# Patient Record
Sex: Female | Born: 1960
Health system: Southern US, Community
[De-identification: ages and names within clinical notes are randomized; demographics above are authoritative.]

## PROBLEM LIST (undated history)

## (undated) DIAGNOSIS — R519 Headache, unspecified: Secondary | ICD-10-CM

## (undated) DIAGNOSIS — M1990 Unspecified osteoarthritis, unspecified site: Secondary | ICD-10-CM

## (undated) DIAGNOSIS — J189 Pneumonia, unspecified organism: Secondary | ICD-10-CM

## (undated) DIAGNOSIS — R51 Headache: Secondary | ICD-10-CM

## (undated) DIAGNOSIS — F419 Anxiety disorder, unspecified: Secondary | ICD-10-CM

## (undated) DIAGNOSIS — F329 Major depressive disorder, single episode, unspecified: Secondary | ICD-10-CM

## (undated) DIAGNOSIS — G43909 Migraine, unspecified, not intractable, without status migrainosus: Secondary | ICD-10-CM

## (undated) DIAGNOSIS — F32A Depression, unspecified: Secondary | ICD-10-CM

## (undated) DIAGNOSIS — G629 Polyneuropathy, unspecified: Secondary | ICD-10-CM

## (undated) DIAGNOSIS — I10 Essential (primary) hypertension: Secondary | ICD-10-CM

## (undated) HISTORY — PX: CARPAL TUNNEL RELEASE: SHX101

## (undated) HISTORY — PX: OTHER SURGICAL HISTORY: SHX169

## (undated) HISTORY — PX: NECK SURGERY: SHX720

## (undated) HISTORY — PX: KNEE ARTHROSCOPY: SUR90

## (undated) HISTORY — PX: TONSILLECTOMY: SUR1361

## (undated) HISTORY — PX: SHOULDER ARTHROSCOPY: SHX128

## (undated) HISTORY — PX: KNEE SURGERY: SHX244

## (undated) HISTORY — PX: ABDOMINAL HYSTERECTOMY: SHX81

---

## 1999-06-21 HISTORY — PX: BREAST EXCISIONAL BIOPSY: SUR124

## 2010-01-27 ENCOUNTER — Emergency Department (HOSPITAL_COMMUNITY): Admission: EM | Admit: 2010-01-27 | Discharge: 2010-01-27 | Payer: Self-pay | Admitting: Family Medicine

## 2010-04-24 ENCOUNTER — Inpatient Hospital Stay (HOSPITAL_COMMUNITY): Admission: EM | Admit: 2010-04-24 | Discharge: 2010-04-25 | Payer: Self-pay | Admitting: Emergency Medicine

## 2010-08-31 LAB — DIFFERENTIAL
Basophils Relative: 0 % (ref 0–1)
Lymphs Abs: 4.3 10*3/uL — ABNORMAL HIGH (ref 0.7–4.0)
Monocytes Absolute: 1.5 10*3/uL — ABNORMAL HIGH (ref 0.1–1.0)
Neutro Abs: 13.7 10*3/uL — ABNORMAL HIGH (ref 1.7–7.7)
Neutrophils Relative %: 70 % (ref 43–77)

## 2010-08-31 LAB — CULTURE, BLOOD (ROUTINE X 2): Culture  Setup Time: 201111051824

## 2010-08-31 LAB — URINALYSIS, ROUTINE W REFLEX MICROSCOPIC
Glucose, UA: NEGATIVE mg/dL
Ketones, ur: 15 mg/dL — AB
Leukocytes, UA: NEGATIVE
pH: 5 (ref 5.0–8.0)

## 2010-08-31 LAB — CBC
HCT: 42.6 % (ref 36.0–46.0)
Hemoglobin: 12.7 g/dL (ref 12.0–15.0)
Platelets: 113 10*3/uL — ABNORMAL LOW (ref 150–400)
Platelets: 197 10*3/uL (ref 150–400)
RBC: 3.86 MIL/uL — ABNORMAL LOW (ref 3.87–5.11)
RDW: 14.8 % (ref 11.5–15.5)
WBC: 19.6 10*3/uL — ABNORMAL HIGH (ref 4.0–10.5)

## 2010-08-31 LAB — GLUCOSE, CAPILLARY
Glucose-Capillary: 128 mg/dL — ABNORMAL HIGH (ref 70–99)
Glucose-Capillary: 147 mg/dL — ABNORMAL HIGH (ref 70–99)
Glucose-Capillary: 195 mg/dL — ABNORMAL HIGH (ref 70–99)

## 2010-08-31 LAB — BASIC METABOLIC PANEL
BUN: 26 mg/dL — ABNORMAL HIGH (ref 6–23)
Calcium: 8.9 mg/dL (ref 8.4–10.5)
Creatinine, Ser: 0.81 mg/dL (ref 0.4–1.2)
GFR calc Af Amer: >60 mL/min (ref >60)
GFR calc non Af Amer: 23 mL/min — ABNORMAL LOW (ref >60)
GFR calc non Af Amer: >60 mL/min (ref >60)
Potassium: 4.7 mEq/L (ref 3.5–5.1)
Sodium: 137 mEq/L (ref 135–145)

## 2010-08-31 LAB — URINE MICROSCOPIC-ADD ON

## 2010-08-31 LAB — HEMOGLOBIN A1C
Hgb A1c MFr Bld: 6 % — ABNORMAL HIGH (ref <5.7)
Mean Plasma Glucose: 126 mg/dL — ABNORMAL HIGH (ref <117)

## 2010-09-01 ENCOUNTER — Emergency Department (HOSPITAL_COMMUNITY)
Admission: EM | Admit: 2010-09-01 | Discharge: 2010-09-02 | Disposition: A | Discharge status: Home / Self Care | Payer: Self-pay | Attending: Emergency Medicine | Admitting: Emergency Medicine

## 2010-09-01 DIAGNOSIS — F411 Generalized anxiety disorder: Secondary | ICD-10-CM | POA: Insufficient documentation

## 2010-09-01 DIAGNOSIS — Z79899 Other long term (current) drug therapy: Secondary | ICD-10-CM | POA: Insufficient documentation

## 2010-09-01 DIAGNOSIS — E669 Obesity, unspecified: Secondary | ICD-10-CM | POA: Insufficient documentation

## 2010-09-01 DIAGNOSIS — F3289 Other specified depressive episodes: Secondary | ICD-10-CM | POA: Insufficient documentation

## 2010-09-01 DIAGNOSIS — E119 Type 2 diabetes mellitus without complications: Secondary | ICD-10-CM | POA: Insufficient documentation

## 2010-09-01 DIAGNOSIS — M545 Low back pain, unspecified: Secondary | ICD-10-CM | POA: Insufficient documentation

## 2010-09-01 DIAGNOSIS — I10 Essential (primary) hypertension: Secondary | ICD-10-CM | POA: Insufficient documentation

## 2010-09-01 DIAGNOSIS — F329 Major depressive disorder, single episode, unspecified: Secondary | ICD-10-CM | POA: Insufficient documentation

## 2011-04-19 ENCOUNTER — Observation Stay (HOSPITAL_COMMUNITY)
Admission: EM | Admit: 2011-04-19 | Discharge: 2011-04-21 | Disposition: A | Discharge status: Home / Self Care | Payer: Self-pay | Attending: Internal Medicine | Admitting: Internal Medicine

## 2011-04-19 ENCOUNTER — Emergency Department (HOSPITAL_COMMUNITY): Payer: Self-pay

## 2011-04-19 DIAGNOSIS — F329 Major depressive disorder, single episode, unspecified: Secondary | ICD-10-CM | POA: Insufficient documentation

## 2011-04-19 DIAGNOSIS — R079 Chest pain, unspecified: Principal | ICD-10-CM | POA: Insufficient documentation

## 2011-04-19 DIAGNOSIS — R0609 Other forms of dyspnea: Secondary | ICD-10-CM | POA: Insufficient documentation

## 2011-04-19 DIAGNOSIS — I10 Essential (primary) hypertension: Secondary | ICD-10-CM | POA: Insufficient documentation

## 2011-04-19 DIAGNOSIS — D696 Thrombocytopenia, unspecified: Secondary | ICD-10-CM | POA: Insufficient documentation

## 2011-04-19 DIAGNOSIS — E119 Type 2 diabetes mellitus without complications: Secondary | ICD-10-CM | POA: Insufficient documentation

## 2011-04-19 DIAGNOSIS — M7989 Other specified soft tissue disorders: Secondary | ICD-10-CM | POA: Insufficient documentation

## 2011-04-19 DIAGNOSIS — R0989 Other specified symptoms and signs involving the circulatory and respiratory systems: Secondary | ICD-10-CM | POA: Insufficient documentation

## 2011-04-19 DIAGNOSIS — F3289 Other specified depressive episodes: Secondary | ICD-10-CM | POA: Insufficient documentation

## 2011-04-19 LAB — CBC
MCV: 96.7 fL (ref 78.0–100.0)
Platelets: 19 10*3/uL — CL (ref 150–400)
RBC: 3.66 MIL/uL — ABNORMAL LOW (ref 3.87–5.11)
RDW: 15.2 % (ref 11.5–15.5)
WBC: 7.8 10*3/uL (ref 4.0–10.5)

## 2011-04-19 LAB — COMPREHENSIVE METABOLIC PANEL
Albumin: 3.6 g/dL (ref 3.5–5.2)
BUN: 21 mg/dL (ref 6–23)
Chloride: 101 mEq/L (ref 96–112)
Creatinine, Ser: 0.94 mg/dL (ref 0.50–1.10)
GFR calc Af Amer: 80 mL/min — ABNORMAL LOW (ref >90)
Glucose, Bld: 100 mg/dL — ABNORMAL HIGH (ref 70–99)
Total Bilirubin: 0.4 mg/dL (ref 0.3–1.2)
Total Protein: 6.1 g/dL (ref 6.0–8.3)

## 2011-04-19 LAB — POCT I-STAT TROPONIN I: Troponin i, poc: 0 ng/mL (ref 0.00–0.08)

## 2011-04-19 LAB — PROTIME-INR
INR: 1.02 (ref 0.00–1.49)
Prothrombin Time: 13.6 seconds (ref 11.6–15.2)

## 2011-04-19 LAB — APTT: aPTT: 32 seconds (ref 24–37)

## 2011-04-20 DIAGNOSIS — R072 Precordial pain: Secondary | ICD-10-CM

## 2011-04-20 LAB — CK TOTAL AND CKMB (NOT AT ARMC)
CK, MB: 2.6 ng/mL (ref 0.3–4.0)
Relative Index: INVALID (ref 0.0–2.5)

## 2011-04-20 LAB — CARDIAC PANEL(CRET KIN+CKTOT+MB+TROPI)
Relative Index: INVALID (ref 0.0–2.5)
Relative Index: INVALID (ref 0.0–2.5)
Total CK: 40 U/L (ref 7–177)
Troponin I: <0.3 ng/mL (ref <0.30)
Troponin I: <0.3 ng/mL (ref <0.30)

## 2011-04-20 LAB — TROPONIN I: Troponin I: <0.3 ng/mL (ref <0.30)

## 2011-04-20 LAB — CBC
MCHC: 32.4 g/dL (ref 30.0–36.0)
Platelets: 118 10*3/uL — ABNORMAL LOW (ref 150–400)
RDW: 15.3 % (ref 11.5–15.5)

## 2011-04-20 LAB — LIPID PANEL
Cholesterol: 138 mg/dL (ref 0–200)
Total CHOL/HDL Ratio: 3.5 RATIO

## 2011-04-20 LAB — HEMOGLOBIN A1C: Mean Plasma Glucose: 120 mg/dL — ABNORMAL HIGH (ref <117)

## 2011-04-20 LAB — GLUCOSE, CAPILLARY
Glucose-Capillary: 100 mg/dL — ABNORMAL HIGH (ref 70–99)
Glucose-Capillary: 100 mg/dL — ABNORMAL HIGH (ref 70–99)

## 2011-04-20 LAB — DIC (DISSEMINATED INTRAVASCULAR COAGULATION)PANEL
INR: 1.14 (ref 0.00–1.49)
Prothrombin Time: 14.8 seconds (ref 11.6–15.2)

## 2011-04-20 MED ORDER — IOHEXOL 300 MG/ML  SOLN
100.0000 mL | Freq: Once | INTRAMUSCULAR | Status: AC | PRN
  Administered 2011-04-20: 100 mL via INTRAVENOUS

## 2011-04-21 LAB — GLUCOSE, CAPILLARY: Glucose-Capillary: 105 mg/dL — ABNORMAL HIGH (ref 70–99)

## 2011-04-21 LAB — CBC
Hemoglobin: 12.2 g/dL (ref 12.0–15.0)
MCH: 32.4 pg (ref 26.0–34.0)
MCHC: 32.4 g/dL (ref 30.0–36.0)
MCV: 99.7 fL (ref 78.0–100.0)
Platelets: 113 10*3/uL — ABNORMAL LOW (ref 150–400)

## 2011-04-21 NOTE — H&P (Signed)
Briana Alvarez, MOL NO.:  [id number]  MEDICAL RECORD NO.:  [id number]  LOCATION:  [facility]                         FACILITY:  [facility]  PHYSICIAN:  Houston Siren, MD           DATE OF BIRTH:  Jun 24, 1960  DATE OF ADMISSION:  04/19/2011 DATE OF DISCHARGE:                             HISTORY & PHYSICAL   PRIMARY CARE PHYSICIAN:  None.  REASON FOR ADMISSION:  Chest pain and thrombocytopenia.  ADVANCED DIRECTIVE:  Full code.  HISTORY OF PRESENT ILLNESS:  This is a 50 year old female with history of type 2 diabetes, hypertension, anxiety, and depression, presents to the emergency room with substernal chest pain, left arm pain radiating to her left neck for several hours today.  She does have baseline stress, but nothing unusual.  She has no headache, fever, chills, nausea, and vomiting.  Her pain began at rest, and she has no history of exertional angina.  Evaluation in emergency room with an EKG showed normal sinus rhythm and no acute ST-T changes.  Her chest x-ray is clear.  She had a CT pulmonary angiogram, which was negative as well.  Her cardiac markers were negative.  Routine blood work showed that she had a platelet count of 19,000 with normal white count and normal hemoglobin.  Her previous platelet count was in the normal range.  She denied any alcohol use and has not been on any new medications.  She was not told of any problem with her platelet count before this.  She was given prednisone in the past, but she did not recall the reason though she was quite sure it didn't have anything to do with her platelet count.  She admits to having easy bruisability, but no excessive bleeding.  She is pain-free and comfortable in the emergency room at this time.  Hospitalist was asked to admit the patient because of chest pain and thrombocytopenia, newly discovered.  PAST MEDICAL HISTORY:  Hypertension, diabetes, and depression.  ALLERGIES:  PREDNISONE causes gastric  irritation.  CURRENT MEDICATIONS:  Atenolol, Wellbutrin, Celebrex, glipizide, Lamictal, lisinopril, metformin, and trazodone.  FAMILY HISTORY:  Noncontributory.  SOCIAL HISTORY:  She denied tobacco, alcohol, or drug use.  Previously, she worked at [employer].  She is divorced with children.  PHYSICAL EXAMINATION:  VITAL SIGNS:  Blood pressure is 130/80, pulse of 50, respiratory rate of 18, and temp 98.0. HEENT:  Exam showed that she has no conjunctival hemorrhage.  Sclerae are nonicteric. CARDIAC:  S1 and S2.  Regular. LUNGS:  Clear.  No wheezes, rales, or any evidence of consolidation. ABDOMEN:  Soft, nondistended, and nontender.  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 EXTREMITIES:  No edema.  She has no petechiae. SKIN:  Warm and dry.  Good distal pulses bilaterally.  No carotid bruit. NEUROLOGIC AND PSYCHIATRIC:  Unremarkable as well.  IMPRESSION:  This is a 50 year old with increased cardiac risk factors including diabetes, hypertension, presented with chest pain.  We will admit her to telemetry and rule out with serial CPKs and troponins.  She has been on a baby aspirin a day, we will continue this.  We will get  an echo of her heart.  With respect to her low platelet count, she is not a drinker and has not been on any medications causing thrombocytopenia that I can see.  I will repeat the platelet count to confirm true thrombocytopenia.  She has no petechiae and no bruises and is not bleeding.  Suspect ITP if repeat confirmed thrombocytopenia.  If this is true, please consult Hematology as she might need a bone marrow exam. She will be admitted to telemetry to [facility].  She is a full code.     Houston Siren, MD     PL/MEDQ  D:  04/20/2011  T:  04/20/2011  Job:  [id number]  Electronically Signed by Houston Siren  on 04/21/2011 07:17:47 PM

## 2011-04-22 NOTE — Discharge Summary (Signed)
Briana Alvarez, Briana Alvarez NO.:  [id number]  MEDICAL RECORD NO.:  [id number]  LOCATION:  2028                         FACILITY:  [facility]  PHYSICIAN:  Briana Llano, MD       DATE OF BIRTH:  1961/01/04  DATE OF ADMISSION:  04/19/2011 DATE OF DISCHARGE:                              DISCHARGE SUMMARY   PRIMARY CARE PHYSICIAN:  [facility].  REASON FOR ADMISSION:  Chest pain and thrombocytopenia.  DISCHARGE DIAGNOSES: 1. Chest pain musculoskeletal radiating in the left neck and face. 2. Mild thrombocytopenia, chronic since November 2011. 3. Diabetes mellitus type 2. 4. Hypertension. 5. Depression.  DISCHARGE MEDICATIONS: 1. Percocet 5/325 mg 1-2 tablets every 6 hours as needed for pain. 2. Aspirin 81 mg p.o. daily. 3. Atenolol 50 mg p.o. daily. 4. Celexa 20 mg p.o. daily. 5. Gabapentin 300 mg 3 capsules 3 times a day. 6. Glipizide 10 mg p.o. b.i.d. 7. Lamictal 100 mg p.o. daily. 8. Lisinopril 10 mg p.o. daily. 9. Metformin 1000 mg p.o. b.i.d. 10.Trazodone 100 mg 2 tablets daily at bedtime. 11.Wellbutrin 150 mg p.o. daily.  BRIEF HISTORY AND EXAMINATION:  [name] is a 50 year old female with past medical history of type 2 diabetes mellitus and hypertension.  The patient came to the hospital complaining about left arm pain radiates to her left neck for several hours today.  The patient does have baseline distress, but she said there is nothing unusual.  Upon initial evaluation in the emergency department, EKG showed normal sinus rhythm. The patient evaluated by CT angiogram, which was negative for any PE or other pulmonary problems.  Routine blood work showed platelet count of 19,000 with normal lytes and blood count, and hemoglobin.  The patient admitted to the hospital for further evaluation.  BRIEF HOSPITAL COURSE: 1. Chest pain.  The left arm pain is likely radicular pain from the     left side of the neck.  The patient does  have point of tenderness     in her left neck, which elicit the pain in the left arm as well.     The patient thought this was the source for the pain and was given     pain medications. 2. CT angio of the chest was negative for PE and pneumonia.  Three 3     sets of cardiac enzymes and repeat EKG was negative for acute     coronary syndrome. 3. Thrombocytopenia.  The thrombocytopenia is chronic, which is mild.     At the time of admission, platelet count was 19,000.  This was     repeated in less than 6 hours, which showed platelets of 118.  This     morning, platelets was 113.  This was double checked in November     2011, it was about 119.  So it is chronic mild thrombocytopenia.     The patient to follow up with her primary care physician for     further evaluation.  The patient denies any bruising or easy     bruisability or bleeding. 4. Diabetes mellitus type 2.  It seems pretty controlled.  Hemoglobin  A1c is 5.8.  The patient is on glipizide and metformin and that     will be continued. 5. Hypertension.  The patient on glipizide and lisinopril, that was     continued throughout the hospital stay.  Please note that her blood     pressure was okay in the hospital, but echocardiogram showed grade     2 diastolic dysfunction.  The patient does not have any symptoms or     signs of fluid overload.  The patient probably needs tighter     control of her blood pressure as an outpatient.  ANCILLARY TESTS: 1. CT angio of the chest showed negative for PE.  Negative for aortic     aneurysm or dissection.  Lungs are clear. 2. Echocardiogram showed ejection fraction of 58%, normal wall motion.     There is abnormal relaxation with increased filling pressure with     grade 2 diastolic dysfunction.     Briana Llano, MD     ME/MEDQ  D:  04/21/2011  T:  04/21/2011  Job:  [id number]  cc:   Briana Hedge, MD  Electronically Signed by Briana Alvarez  on 04/22/2011 07:05:36 AM

## 2011-07-23 ENCOUNTER — Encounter (HOSPITAL_COMMUNITY): Payer: Self-pay | Admitting: *Deleted

## 2011-07-23 ENCOUNTER — Emergency Department (INDEPENDENT_AMBULATORY_CARE_PROVIDER_SITE_OTHER): Payer: 59

## 2011-07-23 ENCOUNTER — Emergency Department (INDEPENDENT_AMBULATORY_CARE_PROVIDER_SITE_OTHER)
Admission: EM | Admit: 2011-07-23 | Discharge: 2011-07-23 | Disposition: A | Discharge status: Home / Self Care | Payer: Self-pay | Source: Home / Self Care | Attending: Family Medicine | Admitting: Family Medicine

## 2011-07-23 DIAGNOSIS — M79609 Pain in unspecified limb: Secondary | ICD-10-CM

## 2011-07-23 DIAGNOSIS — M79606 Pain in leg, unspecified: Secondary | ICD-10-CM

## 2011-07-23 HISTORY — DX: Depression, unspecified: F32.A

## 2011-07-23 HISTORY — DX: Major depressive disorder, single episode, unspecified: F32.9

## 2011-07-23 HISTORY — DX: Essential (primary) hypertension: I10

## 2011-07-23 HISTORY — DX: Anxiety disorder, unspecified: F41.9

## 2011-07-23 MED ORDER — HYDROCODONE-ACETAMINOPHEN 5-325 MG PO TABS: ORAL_TABLET | ORAL | Status: AC

## 2011-07-23 NOTE — ED Notes (Signed)
Per pt fell last night bruising and pain right upper thigh - per pt walking in the dark walked into door frame bruising across bridge of nose  - per pt fell does not remember thigh hitting anything - pt walking with limp c/o burning type pain

## 2011-07-23 NOTE — ED Provider Notes (Signed)
History     CSN: [id number]  Arrival date & time 07/23/11  1806   First MD Initiated Contact with Patient 07/23/11 1817      Chief Complaint  Patient presents with  . Fall    (Consider location/radiation/quality/duration/timing/severity/associated sxs/prior treatment) HPI Comments: Briana Alvarez presents for evaluation of pain in her right hip and thigh. She reports that she was walking in the middle of the night after she got up from sleeping and walked into a door or a wall. She states that she then fell backwards, but doesn't remember what she hit if anything. She now reports pain in her right hip with all movement. There is also a very large bruise to her lateral right thigh. However, she is still able to ambulate well.  Patient is a 51 y.o. female presenting with fall. The history is provided by the patient.  Fall The accident occurred yesterday. The fall occurred while walking. She fell from an unknown height. She landed on a hard floor. The point of impact was the head and right hip. She was ambulatory at the scene. Associated symptoms include tingling. The symptoms are aggravated by activity and pressure on the injury.    Past Medical History  Diagnosis Date  . Diabetes mellitus   . Hypertension   . Depression   . Anxiety     Past Surgical History  Procedure Date  . Abdominal hysterectomy   . Carpal tunnel release   . Ulner nerve      History reviewed. No pertinent family history.  History  Substance Use Topics  . Smoking status: Never Smoker   . Smokeless tobacco: Not on file  . Alcohol Use: No    OB History    Grav Para Term Preterm Abortions TAB SAB Ect Mult Living                  Review of Systems  Constitutional: Negative.   HENT: Negative.   Eyes: Negative.   Respiratory: Negative.   Cardiovascular: Negative.   Gastrointestinal: Negative.   Genitourinary: Negative.   Musculoskeletal: Positive for arthralgias.       RIGHT hip and thigh pain  Skin:  Negative.   Neurological: Positive for tingling.    Allergies  Review of patient's allergies indicates no known allergies.  Home Medications   Current Outpatient Rx  Name Route Sig Dispense Refill  . ATENOLOL 25 MG PO TABS Oral Take 25 mg by mouth daily.    . BUPROPION HCL 100 MG PO TABS Oral Take by mouth 2 (two) times daily. Unknown dosage    . CITALOPRAM HYDROBROMIDE 10 MG PO TABS Oral Take 10 mg by mouth daily.    Marland Kitchen GABAPENTIN 300 MG PO CAPS Oral Take 300 mg by mouth 3 (three) times daily.    Marland Kitchen GLIPIZIDE 10 MG PO TABS Oral Take 10 mg by mouth 2 (two) times daily before a meal.    . LISINOPRIL 5 MG PO TABS Oral Take 5 mg by mouth daily.    Marland Kitchen METFORMIN HCL 1000 MG PO TABS Oral Take 1,000 mg by mouth 2 (two) times daily with a meal.    . HYDROCODONE-ACETAMINOPHEN 5-325 MG PO TABS  Take one to two tablets every 4 to 6 hours as needed for pain 20 tablet 0    BP 177/87  Pulse 70  Temp(Src) 98 F (36.7 C) (Oral)  Resp 20  SpO2 98%  Physical Exam  Nursing note and vitals reviewed. Constitutional: She is oriented  to person, place, and time. She appears well-developed and well-nourished.  HENT:  Head: Normocephalic and atraumatic.  Eyes: EOM are normal.  Neck: Normal range of motion.  Pulmonary/Chest: Effort normal.  Musculoskeletal:       Right hip: She exhibits decreased range of motion, tenderness and bony tenderness.       Legs: Neurological: She is alert and oriented to person, place, and time.  Skin: Skin is warm and dry. Bruising noted.     Psychiatric: Her behavior is normal.    ED Course  Procedures (including critical care time)  Labs Reviewed - No data to display Dg Hip Complete Right  07/23/2011  *RADIOLOGY REPORT*  Clinical Data: Fall.  Right-sided hip pain and bruising.  RIGHT HIP - COMPLETE 2+ VIEW  Comparison:  None.  Findings:  There is no evidence of hip fracture or dislocation. There is no evidence of arthropathy or other focal bone abnormality.   IMPRESSION: Negative.  Original Report Authenticated By: Danae Orleans, M.D.     1. Leg pain       MDM  RIGHT hip and femur xrays: reviewed by radiologist and myself; no acute fracture or finding        Richardo Priest, MD 07/23/11 1925

## 2011-07-28 LAB — PROTIME-INR

## 2011-12-03 ENCOUNTER — Emergency Department (HOSPITAL_COMMUNITY)
Admission: EM | Admit: 2011-12-03 | Discharge: 2011-12-03 | Disposition: A | Discharge status: Home / Self Care | Payer: Self-pay | Attending: Emergency Medicine | Admitting: Emergency Medicine

## 2011-12-03 ENCOUNTER — Encounter (HOSPITAL_COMMUNITY): Payer: Self-pay | Admitting: *Deleted

## 2011-12-03 DIAGNOSIS — F411 Generalized anxiety disorder: Secondary | ICD-10-CM | POA: Insufficient documentation

## 2011-12-03 DIAGNOSIS — F3289 Other specified depressive episodes: Secondary | ICD-10-CM | POA: Insufficient documentation

## 2011-12-03 DIAGNOSIS — I10 Essential (primary) hypertension: Secondary | ICD-10-CM | POA: Insufficient documentation

## 2011-12-03 DIAGNOSIS — F329 Major depressive disorder, single episode, unspecified: Secondary | ICD-10-CM | POA: Insufficient documentation

## 2011-12-03 DIAGNOSIS — E119 Type 2 diabetes mellitus without complications: Secondary | ICD-10-CM | POA: Insufficient documentation

## 2011-12-03 DIAGNOSIS — J040 Acute laryngitis: Secondary | ICD-10-CM

## 2011-12-03 MED ORDER — GABAPENTIN 300 MG PO CAPS: 900.0000 mg | ORAL_CAPSULE | Freq: Three times a day (TID) | ORAL | Status: DC

## 2011-12-03 NOTE — Discharge Instructions (Signed)
As we discussed, you can use "Throat Coat" tea or regular tea with honey to help soothe your throat. Lubricating throat lozenges can also be helpful.  Laryngitis At the top of your windpipe is your voice box. It is the source of your voice. Inside your voice box are 2 bands of muscles called vocal cords. When you breathe, your vocal cords are relaxed and open so that air can get into the lungs. When you decide to say something, these cords come together and vibrate. The sound from these vibrations goes into your throat and comes out through your mouth as sound. Laryngitis is an inflammation of the vocal cords that causes hoarseness, cough, loss of voice, sore throat, and dry throat. Laryngitis can be temporary (acute) or long-term (chronic). Most cases of acute laryngitis improve with time.Chronic laryngitis lasts for more than 3 weeks. CAUSES Laryngitis can often be related to excessive smoking, talking, or yelling, as well as inhalation of toxic fumes and allergies. Acute laryngitis is usually caused by a viral infection, vocal strain, measles or mumps, or bacterial infections. Chronic laryngitis is usually caused by vocal cord strain, vocal cord injury, postnasal drip, growths on the vocal cords, or acid reflux. SYMPTOMS   Cough.   Sore throat.   Dry throat.  RISK FACTORS  Respiratory infections.   Exposure to irritating substances, such as cigarette smoke, excessive amounts of alcohol, stomach acids, and workplace chemicals.   Voice trauma, such as vocal cord injury from shouting or speaking too loud.  DIAGNOSIS  Your cargiver will perform a physical exam. During the physical exam, your caregiver will examine your throat. The most common sign of laryngitis is hoarseness. Laryngoscopy may be necessary to confirm the diagnosis of this condition. This procedure allows your caregiver to look into the larynx. HOME CARE INSTRUCTIONS  Drink enough fluids to keep your urine clear or pale  yellow.   Rest until you no longer have symptoms or as directed by your caregiver.   Breathe in moist air.   Take all medicine as directed by your caregiver.   Do not smoke.   Talk as little as possible (this includes whispering).   Write on paper instead of talking until your voice is back to normal.   Follow up with your caregiver if your condition has not improved after 10 days.  SEEK MEDICAL CARE IF:   You have trouble breathing.   You cough up blood.   You have persistent fever.   You have increasing pain.   You have difficulty swallowing.  MAKE SURE YOU:  Understand these instructions.   Will watch your condition.   Will get help right away if you are not doing well or get worse.  Document Released: 06/06/2005 Document Revised: 05/26/2011 Document Reviewed: 08/12/2010 [facility] Patient Information 2012 [place], [place].

## 2011-12-03 NOTE — ED Provider Notes (Signed)
History     CSN: [id number]  Arrival date & time 12/03/11  0603   First MD Initiated Contact with Patient 12/03/11 [phone]      Chief Complaint  Patient presents with  . Sore Throat  . Cough    (Consider location/radiation/quality/duration/timing/severity/associated sxs/prior treatment) Patient is a 51 y.o. female presenting with pharyngitis. The history is provided by the patient.  Sore Throat This is a new problem. The current episode started yesterday. The problem occurs constantly. The problem has been unchanged. Associated symptoms include coughing and a sore throat. Pertinent negatives include no chest pain, chills, fever, neck pain, rash, swollen glands or vomiting.  neg ear pain, rhinorrhea, sinus pressure. Hoarseness began this a.m. Pain mild, scratchy, Worse with swallowing. Used cepacol throat drops with some relief.  Also requests refill of neurontin until PCP office visit on Friday.  Past Medical History  Diagnosis Date  . Diabetes mellitus   . Hypertension   . Depression   . Anxiety     Past Surgical History  Procedure Date  . Abdominal hysterectomy   . Carpal tunnel release   . Ulner nerve    . Tonsillectomy     No family history on file.  History  Substance Use Topics  . Smoking status: Never Smoker   . Smokeless tobacco: Not on file  . Alcohol Use: No     Review of Systems  Constitutional: Negative for fever and chills.  HENT: Positive for sore throat. Negative for neck pain.        See HPI  Respiratory: Positive for cough. Negative for shortness of breath.   Cardiovascular: Negative for chest pain.  Gastrointestinal: Negative for vomiting.  Skin: Negative for rash.    Allergies  Review of patient's allergies indicates no known allergies.  Home Medications   Current Outpatient Rx  Name Route Sig Dispense Refill  . ATENOLOL 25 MG PO TABS Oral Take 25 mg by mouth daily.    . BUPROPION HCL ER (SR) 150 MG PO TB12 Oral Take 150 mg by mouth 2  (two) times daily.    Marland Kitchen CITALOPRAM HYDROBROMIDE 20 MG PO TABS Oral Take 20 mg by mouth daily.    Marland Kitchen GABAPENTIN 300 MG PO CAPS Oral Take 300 mg by mouth 3 (three) times daily.    Marland Kitchen GLIPIZIDE 5 MG PO TABS Oral Take 5 mg by mouth 2 (two) times daily before a meal.    . HYDROCODONE-ACETAMINOPHEN 5-500 MG PO CAPS Oral Take 1-2 capsules by mouth every 6 (six) hours as needed. pain    . LAMOTRIGINE 100 MG PO TABS Oral Take 100 mg by mouth daily.    Marland Kitchen LISINOPRIL 10 MG PO TABS Oral Take 10 mg by mouth daily.    Marland Kitchen METFORMIN HCL 1000 MG PO TABS Oral Take 1,000 mg by mouth 2 (two) times daily with a meal.    . TRAZODONE HCL 100 MG PO TABS Oral Take 200 mg by mouth at bedtime.      BP 157/86  Pulse 71  Temp 97.5 F (36.4 C) (Oral)  Resp 18  SpO2 97%  Physical Exam  Nursing note reviewed. Constitutional: She appears well-developed and well-nourished. No distress.       VS reviewed, sig for HTN  HENT:  Head: Normocephalic and atraumatic.  Right Ear: External ear normal.  Left Ear: External ear normal.       Bilateral TM normal. Nares patent without rhinorrhea or edema. Oral mucosa moist. Oropharynx with mild  erythema, no edema or exudate. Hoarse spoken voice.  Eyes: Pupils are equal, round, and reactive to light.  Neck: Neck supple.  Cardiovascular: Normal rate, regular rhythm and normal heart sounds.   Pulmonary/Chest: Effort normal and breath sounds normal. No respiratory distress. She has no wheezes.  Musculoskeletal: She exhibits no edema.  Lymphadenopathy:    She has no cervical adenopathy.  Neurological: She is alert.  Skin: Skin is warm and dry.  Psychiatric: She has a normal mood and affect.    ED Course  Procedures (including critical care time)  Labs Reviewed - No data to display No results found.   1. Laryngitis       MDM  Sore throat yesterday, hoarseness today most c/w laryngitis as otherwise only cough, no other URI sx. 0/4 Centor criteria, doubt strep pharyngitis.  No signs of ludwig angina, peritonsillar abscess. Discussed symptomatic tx. I will give pt enough neurontin to last until MD visit Friday, but stressed that ED cannot substitute for PMD and cannot repeatedly refill chronic medications.        [address] [place], [place] 12/03/11 [phone]

## 2011-12-03 NOTE — ED Notes (Signed)
C/o sore throat and cough, (denies: nvd, fever, sob, other pains or other sx), h/o DM. Denies significant pain, "just hoarse scratchy voice".

## 2011-12-04 NOTE — ED Provider Notes (Signed)
Medical screening examination/treatment/procedure(s) were performed by non-physician practitioner and as supervising physician I was immediately available for consultation/collaboration.  Anndrea Mihelich, MD 12/04/11 1911 

## 2011-12-24 ENCOUNTER — Encounter (HOSPITAL_COMMUNITY): Payer: Self-pay | Admitting: *Deleted

## 2011-12-24 ENCOUNTER — Emergency Department (HOSPITAL_COMMUNITY)
Admission: EM | Admit: 2011-12-24 | Discharge: 2011-12-24 | Disposition: A | Discharge status: Home / Self Care | Payer: Self-pay | Attending: Emergency Medicine | Admitting: Emergency Medicine

## 2011-12-24 ENCOUNTER — Emergency Department (HOSPITAL_COMMUNITY): Payer: Self-pay

## 2011-12-24 DIAGNOSIS — I10 Essential (primary) hypertension: Secondary | ICD-10-CM | POA: Insufficient documentation

## 2011-12-24 DIAGNOSIS — X500XXA Overexertion from strenuous movement or load, initial encounter: Secondary | ICD-10-CM | POA: Insufficient documentation

## 2011-12-24 DIAGNOSIS — S8263XA Displaced fracture of lateral malleolus of unspecified fibula, initial encounter for closed fracture: Secondary | ICD-10-CM | POA: Insufficient documentation

## 2011-12-24 DIAGNOSIS — E119 Type 2 diabetes mellitus without complications: Secondary | ICD-10-CM | POA: Insufficient documentation

## 2011-12-24 MED ORDER — OXYCODONE-ACETAMINOPHEN 5-325 MG PO TABS: 1.0000 | ORAL_TABLET | ORAL | Status: AC | PRN

## 2011-12-24 MED ORDER — OXYCODONE-ACETAMINOPHEN 5-325 MG PO TABS
1.0000 | ORAL_TABLET | Freq: Once | ORAL | Status: AC
  Administered 2011-12-24: 1 via ORAL
  Filled 2011-12-24: qty 1

## 2011-12-24 NOTE — ED Notes (Signed)
Patient stepped in hole in her yard and injured her right ankle.  Swelling to the right ankle, pulse present

## 2011-12-24 NOTE — ED Provider Notes (Signed)
History     CSN: [id number]  Arrival date & time 12/24/11  1526   First MD Initiated Contact with Patient 12/24/11 1631      Chief Complaint  Patient presents with  . Ankle Pain    (Consider location/radiation/quality/duration/timing/severity/associated sxs/prior treatment) Patient is a 51 y.o. female presenting with ankle pain. The history is provided by the patient.  Ankle Pain   She in a hole and twisted her right ankle. She denies other injury. Pain is present diffusely through the ankle. Pain is severe and she rates it at 9/10. She is unable to bear weight. Radiata. Pain is worse with palpation or attempted weightbearing or movement. She has not instituted any treatment at home.  Past Medical History  Diagnosis Date  . Diabetes mellitus   . Hypertension   . Depression   . Anxiety     Past Surgical History  Procedure Date  . Abdominal hysterectomy   . Carpal tunnel release   . Ulner nerve    . Tonsillectomy     History reviewed. No pertinent family history.  History  Substance Use Topics  . Smoking status: Never Smoker   . Smokeless tobacco: Not on file  . Alcohol Use: No    OB History    Grav Para Term Preterm Abortions TAB SAB Ect Mult Living                  Review of Systems  All other systems reviewed and are negative.    Allergies  Review of patient's allergies indicates no known allergies.  Home Medications   Current Outpatient Rx  Name Route Sig Dispense Refill  . ATENOLOL 50 MG PO TABS Oral Take 50 mg by mouth daily.    . BUPROPION HCL ER (SR) 150 MG PO TB12 Oral Take 150 mg by mouth 2 (two) times daily.    Marland Kitchen CITALOPRAM HYDROBROMIDE 20 MG PO TABS Oral Take 20 mg by mouth daily.    Marland Kitchen GABAPENTIN 300 MG PO CAPS Oral Take 3 capsules (900 mg total) by mouth 3 (three) times daily. 54 capsule 0  . GLIPIZIDE 5 MG PO TABS Oral Take 5 mg by mouth 2 (two) times daily before a meal.    . HYDROCODONE-ACETAMINOPHEN 7.5-750 MG PO TABS Oral Take 1  tablet by mouth every 6 (six) hours as needed. For pain    . LAMOTRIGINE 100 MG PO TABS Oral Take 100 mg by mouth daily.    Marland Kitchen LISINOPRIL 10 MG PO TABS Oral Take 10 mg by mouth daily.    Marland Kitchen METFORMIN HCL 1000 MG PO TABS Oral Take 1,000 mg by mouth 2 (two) times daily with a meal.    . TRAZODONE HCL 100 MG PO TABS Oral Take 200 mg by mouth at bedtime.    . OXYCODONE-ACETAMINOPHEN 5-325 MG PO TABS Oral Take 1 tablet by mouth every 4 (four) hours as needed for pain. 20 tablet 0    BP 107/38  Pulse 58  Temp 98.5 F (36.9 C) (Oral)  Resp 18  SpO2 97%  Physical Exam  Nursing note and vitals reviewed.  old female who appears uncomfortable. Vital signs are significant for mild bradycardia with heart rate of 58. Oxygen saturation is 97% which is normal. Head is normocephalic and atraumatic. PERRLA, EOMI. Neck is nontender and supple. Back is nontender. Lungs are clear without rales, wheezes, rhonchi. Heart has regular rate rhythm without murmur. Abdomen is soft, flat, nontender without masses or hepatosplenomegaly. Extremities: There  is moderate swelling of the lateral aspect of the right ankle. There is tenderness to the right ankle both medially and laterally, but most severe tenderness essentially over the tip of the lateral malleolus. There is no ankle instability and her drawer sign is negative. Distal neurovascular exam is intact with strong dorsalis pedis pulses, prompt capillary refill, normal sensation. Skin is dry without rash. Neurologic: Mental status is normal, cranial nerves are intact, there are no motor or sensory deficits.  ED Course  SPLINT APPLICATION Date/Time: 12/24/2011 4:38 PM Performed by: Dione Booze Authorized by: Preston Fleeting, Jaidin Richison Consent: Verbal consent obtained. Written consent not obtained. Risks and benefits: risks, benefits and alternatives were discussed Consent given by: patient Patient understanding: patient states understanding of the procedure being performed Patient  consent: the patient's understanding of the procedure matches consent given Procedure consent: procedure consent matches procedure scheduled Relevant documents: relevant documents present and verified Test results: test results available and properly labeled Site marked: the operative site was marked Imaging studies: imaging studies available Required items: required blood products, implants, devices, and special equipment available Patient identity confirmed: verbally with patient and arm band Time out: Immediately prior to procedure a "time out" was called to verify the correct patient, procedure, equipment, support staff and site/side marked as required. Location details: right ankle Splint type: Cam Walker. Supplies used: Manufacturing systems engineer. Post-procedure: The splinted body part was neurovascularly unchanged following the procedure. Patient tolerance: Patient tolerated the procedure well with no immediate complications. Comments: Cam Dan Humphreys is applied by orthopedic technician.   (including critical care time)  Labs Reviewed - No data to display Dg Ankle Complete Right  12/24/2011  *RADIOLOGY REPORT*  Clinical Data: Lateral right ankle pain and swelling following a fall.  RIGHT ANKLE - COMPLETE 3+ VIEW  Comparison: None.  Findings: Minimally comminuted avulsion fracture of the distal tip of the lateral malleolus with minimal distal displacement of the distal fragment.  Overlying soft tissue swelling.  Possible small ankle joint effusion.  Small to moderate-sized inferior calcaneal spur.  IMPRESSION: Avulsion fracture of the distal tip of the lateral malleolus with a possible small effusion.  Original Report Authenticated By: Darrol Angel, M.D.     1. Closed avulsion fracture of lateral malleolus       MDM  She fall with injury to right ankle-sprain versus fracture. X-ray has been ordered.  Is an avulsion fracture of the lateral malleolus. She'll be placed in a Cam Walker and given  crutches and prescriptions given for Percocet.        Dione Booze, MD 12/24/11 [phone]

## 2011-12-24 NOTE — Progress Notes (Signed)
Orthopedic Tech Progress Note Patient Details:  Briana Alvarez 1960/09/19 [id number]  Ortho Devices Type of Ortho Device: Crutches;CAM walker Ortho Device/Splint Location: right foot Ortho Device/Splint Interventions: Application   Renita Brocks 12/24/2011, 4:56 PM

## 2011-12-24 NOTE — ED Notes (Signed)
Patient transported to X-ray 

## 2011-12-24 NOTE — ED Notes (Signed)
Patient transported back from X-ray 

## 2012-05-27 ENCOUNTER — Encounter (HOSPITAL_COMMUNITY): Payer: Self-pay | Admitting: Emergency Medicine

## 2012-05-27 ENCOUNTER — Emergency Department (HOSPITAL_COMMUNITY): Payer: No Typology Code available for payment source

## 2012-05-27 ENCOUNTER — Emergency Department (HOSPITAL_COMMUNITY)
Admission: EM | Admit: 2012-05-27 | Discharge: 2012-05-27 | Disposition: A | Discharge status: Home / Self Care | Payer: No Typology Code available for payment source | Attending: Emergency Medicine | Admitting: Emergency Medicine

## 2012-05-27 DIAGNOSIS — E119 Type 2 diabetes mellitus without complications: Secondary | ICD-10-CM | POA: Insufficient documentation

## 2012-05-27 DIAGNOSIS — F411 Generalized anxiety disorder: Secondary | ICD-10-CM | POA: Insufficient documentation

## 2012-05-27 DIAGNOSIS — Z79899 Other long term (current) drug therapy: Secondary | ICD-10-CM | POA: Insufficient documentation

## 2012-05-27 DIAGNOSIS — X500XXA Overexertion from strenuous movement or load, initial encounter: Secondary | ICD-10-CM | POA: Insufficient documentation

## 2012-05-27 DIAGNOSIS — S8990XA Unspecified injury of unspecified lower leg, initial encounter: Secondary | ICD-10-CM | POA: Insufficient documentation

## 2012-05-27 DIAGNOSIS — Y9341 Activity, dancing: Secondary | ICD-10-CM | POA: Insufficient documentation

## 2012-05-27 DIAGNOSIS — S99929A Unspecified injury of unspecified foot, initial encounter: Secondary | ICD-10-CM | POA: Insufficient documentation

## 2012-05-27 DIAGNOSIS — F3289 Other specified depressive episodes: Secondary | ICD-10-CM | POA: Insufficient documentation

## 2012-05-27 DIAGNOSIS — Y9229 Other specified public building as the place of occurrence of the external cause: Secondary | ICD-10-CM | POA: Insufficient documentation

## 2012-05-27 DIAGNOSIS — M25569 Pain in unspecified knee: Secondary | ICD-10-CM

## 2012-05-27 DIAGNOSIS — F329 Major depressive disorder, single episode, unspecified: Secondary | ICD-10-CM | POA: Insufficient documentation

## 2012-05-27 DIAGNOSIS — I10 Essential (primary) hypertension: Secondary | ICD-10-CM | POA: Insufficient documentation

## 2012-05-27 MED ORDER — OXYCODONE-ACETAMINOPHEN 5-325 MG PO TABS
2.0000 | ORAL_TABLET | Freq: Once | ORAL | Status: AC
  Administered 2012-05-27: 2 via ORAL
  Filled 2012-05-27: qty 2

## 2012-05-27 MED ORDER — OXYCODONE-ACETAMINOPHEN 5-325 MG PO TABS: 1.0000 | ORAL_TABLET | Freq: Four times a day (QID) | ORAL | Status: DC | PRN

## 2012-05-27 NOTE — ED Provider Notes (Signed)
History   This chart was scribed for Jones Skene, MD by Melba Coon, ED Scribe. The patient was seen in room TR05C/TR05C and the patient's care was started at 7:55PM.    CSN: [id number]  Arrival date & time 05/27/12  1910   First MD Initiated Contact with Patient 05/27/12 1940      Chief Complaint  Patient presents with  . Knee Pain    (Consider location/radiation/quality/duration/timing/severity/associated sxs/prior treatment) The history is provided by the patient. No language interpreter was used.   Briana Alvarez is a 51 y.o. female who presents to the Emergency Department complaining of constant, moderate to severe left knee pain with an onset today at 1:00PM. She reports twisting her knee while she was dancing in church. It just started hurting so she sat down. She did not hear a pop or feel acute pain and fall. Elevation and ice did not alleviate the pain. She reports the pain feels similar to when she tore her medial meniscus in her right leg. She reports decreased ambulation and knee movement compared to baseline. Denies HA, fever, neck pain, sore throat, rash, back pain, CP, SOB, abdominal pain, nausea, emesis, diarrhea, dysuria, or extremity edema, weakness, numbness, or tingling. 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 She has chronic right ankle pain and has it followed by her PCP but has seen an orthopedist for it. No known allergies. No other pertinent medical symptoms.   Past Medical History  Diagnosis Date  . Diabetes mellitus   . Hypertension   . Depression   . Anxiety     Past Surgical History  Procedure Date  . Abdominal hysterectomy   . Carpal tunnel release   . Ulner nerve    . Tonsillectomy     No family history on file.  History  Substance Use Topics  . Smoking status: Never Smoker   . Smokeless tobacco: Not on file  . Alcohol Use: No    OB History    Grav Para Term Preterm Abortions TAB SAB Ect Mult Living                 Review of Systems 10 Systems reviewed and are negative for acute change except as noted in the HPI.  Allergies  Review of patient's allergies indicates no known allergies.  Home Medications   Current Outpatient Rx  Name  Route  Sig  Dispense  Refill  . ARIPIPRAZOLE 5 MG PO TABS   Oral   Take 5 mg by mouth daily.         . ATENOLOL 50 MG PO TABS   Oral   Take 50 mg by mouth daily.         Marland Kitchen GABAPENTIN 300 MG PO CAPS   Oral   Take 3 capsules (900 mg total) by mouth 3 (three) times daily.   54 capsule   0   . GLIPIZIDE 5 MG PO TABS   Oral   Take 5 mg by mouth 2 (two) times daily before a meal.         . LISINOPRIL 10 MG PO TABS   Oral   Take 10 mg by mouth daily.         Marland Kitchen METFORMIN HCL 1000 MG PO TABS   Oral   Take 1,000 mg by mouth 2 (two) times daily with a meal.         . OXCARBAZEPINE 150 MG PO TABS   Oral  Take 150 mg by mouth 2 (two) times daily.         . TRAZODONE HCL 100 MG PO TABS   Oral   Take 200 mg by mouth at bedtime.           BP 128/70  Pulse 62  Temp 97.7 F (36.5 C)  Resp 18  SpO2 98%  Physical Exam  Nursing notes reviewed.  Electronic medical record reviewed. VITAL SIGNS:   Filed Vitals:   05/27/12 1919  BP: 128/70  Pulse: 62  Temp: 97.7 F (36.5 C)  Resp: 18  SpO2: 98%   CONSTITUTIONAL: Awake, oriented, appears non-toxic HENT: Atraumatic, normocephalic, oral mucosa pink and moist, airway patent. Nares patent without drainage. External ears normal. EYES: Conjunctiva clear, EOMI, PERRLA NECK: Trachea midline, non-tender, supple CARDIOVASCULAR: Normal heart rate, Normal rhythm, No murmurs, rubs, gallops PULMONARY/CHEST: Clear to auscultation, no rhonchi, wheezes, or rales. Symmetrical breath sounds. Non-tender. ABDOMINAL: Non-distended, soft, non-tender - no rebound or guarding.  BS normal. NEUROLOGIC: Non-focal, moving all four extremities, no gross sensory or motor deficits. EXTREMITIES: No  clubbing, cyanosis, or edema. Tenderness to palpation at the medial joint line. Tenderness to active extension at the knee, patient can extend the knee and flex the knee. No clicking at the knee. A tele-is stable. Anterior-posterior drawer stable. SKIN: Warm, Dry, No erythema, No rash  ED Course  Procedures (including critical care time)  COORDINATION OF CARE:  8:00PM - left knee XR and pain meds will be ordered for Briana Alvarez. She is advised to f/u with her orthopedist. She has crutches and a boot for her right ankle at home; she is advised to use these things along with Aleve and to keep pressure off her left leg. 8:04PM - imaging reviewed and is overall unremarkable except for arthritic changes. She is ready for d/c.   Labs Reviewed - No data to display Dg Knee Complete 4 Views Left  05/27/2012  *RADIOLOGY REPORT*  Clinical Data: Knee pain.  LEFT KNEE - COMPLETE 4+ VIEW  Comparison: None.  Findings: Anatomic alignment.  No fracture.  Proximal tibiofibular joint osteoarthritis with exostosis of the medial fibular head. Mild tricompartmental osteoarthritis.  No effusion.  Osteoarthritis is most pronounced in the medial compartment.  IMPRESSION: Mild tricompartmental osteoarthritis.  No acute osseous abnormality.   Original Report Authenticated By: Andreas Newport, M.D.      1. Medial knee pain   2. Knee injury       MDM  Briana Alvarez is a 51 y.o. female presenting with knee pain suffered in church - patient may have soft tissue damage to medial meniscus/medial collateral ligament - there is a small amount of joint effusion in the medial aspect and tenderness along the joint line. Patient did have a medial tear of her meniscus on the right leg in the past. Joint is stable, we'll place an Ace wrap, and treat the patient's pain conservatively. Rest ice compression and elevation. She and his are seen an orthopedist and will followup with him by calling tomorrow for an appointment.  I  explained the diagnosis and have given explicit precautions to return to the ER including any other new or worsening symptoms. The patient understands and accepts the medical plan as it's been dictated and I have answered their questions. Discharge instructions concerning home care and prescriptions have been given.  The patient is STABLE and is discharged to home in good condition.    I personally performed the services described in this documentation,  which was scribed in my presence. The recorded information has been reviewed and is accurate. Jones Skene, M.D.          Jones Skene, MD 05/27/12 2012

## 2012-05-27 NOTE — ED Notes (Signed)
C/o L knee pain since twisting knee at church today at 1pm.

## 2012-06-28 ENCOUNTER — Emergency Department (HOSPITAL_COMMUNITY)
Admission: EM | Admit: 2012-06-28 | Discharge: 2012-06-28 | Disposition: A | Discharge status: Home / Self Care | Payer: No Typology Code available for payment source | Attending: Emergency Medicine | Admitting: Emergency Medicine

## 2012-06-28 ENCOUNTER — Encounter (HOSPITAL_COMMUNITY): Payer: Self-pay | Admitting: *Deleted

## 2012-06-28 DIAGNOSIS — E119 Type 2 diabetes mellitus without complications: Secondary | ICD-10-CM | POA: Insufficient documentation

## 2012-06-28 DIAGNOSIS — T7840XA Allergy, unspecified, initial encounter: Secondary | ICD-10-CM

## 2012-06-28 DIAGNOSIS — F411 Generalized anxiety disorder: Secondary | ICD-10-CM | POA: Insufficient documentation

## 2012-06-28 DIAGNOSIS — F3289 Other specified depressive episodes: Secondary | ICD-10-CM | POA: Insufficient documentation

## 2012-06-28 DIAGNOSIS — I10 Essential (primary) hypertension: Secondary | ICD-10-CM | POA: Insufficient documentation

## 2012-06-28 DIAGNOSIS — F329 Major depressive disorder, single episode, unspecified: Secondary | ICD-10-CM | POA: Insufficient documentation

## 2012-06-28 DIAGNOSIS — Z79899 Other long term (current) drug therapy: Secondary | ICD-10-CM | POA: Insufficient documentation

## 2012-06-28 DIAGNOSIS — L272 Dermatitis due to ingested food: Secondary | ICD-10-CM | POA: Insufficient documentation

## 2012-06-28 MED ORDER — DIPHENHYDRAMINE HCL 50 MG/ML IJ SOLN
25.0000 mg | Freq: Once | INTRAMUSCULAR | Status: AC
  Administered 2012-06-28: 25 mg via INTRAVENOUS

## 2012-06-28 MED ORDER — DIPHENHYDRAMINE HCL 50 MG/ML IJ SOLN
INTRAMUSCULAR | Status: AC
  Filled 2012-06-28: qty 1

## 2012-06-28 MED ORDER — PREDNISONE 20 MG PO TABS: ORAL_TABLET | ORAL | Status: DC

## 2012-06-28 MED ORDER — EPINEPHRINE 0.3 MG/0.3ML IJ DEVI: 0.3000 mg | Freq: Once | INTRAMUSCULAR | Status: DC

## 2012-06-28 MED ORDER — RANITIDINE HCL 150 MG/10ML PO SYRP
150.0000 mg | ORAL_SOLUTION | ORAL | Status: AC
  Administered 2012-06-28: 150 mg via ORAL
  Filled 2012-06-28: qty 10

## 2012-06-28 MED ORDER — DIPHENHYDRAMINE HCL 25 MG PO TABS: 25.0000 mg | ORAL_TABLET | Freq: Three times a day (TID) | ORAL | Status: DC | PRN

## 2012-06-28 MED ORDER — METHYLPREDNISOLONE SODIUM SUCC 125 MG IJ SOLR
125.0000 mg | Freq: Once | INTRAMUSCULAR | Status: AC
  Administered 2012-06-28: 125 mg via INTRAVENOUS
  Filled 2012-06-28: qty 2

## 2012-06-28 NOTE — ED Provider Notes (Signed)
History     CSN: [id number]  Arrival date & time 06/28/12  2100   First MD Initiated Contact with Patient 06/28/12 2108      Chief Complaint  Patient presents with  . Allergic Reaction    (Consider location/radiation/quality/duration/timing/severity/associated sxs/prior treatment) Patient is a 52 y.o. female presenting with allergic reaction. The history is provided by the patient. No language interpreter was used.  Allergic Reaction The primary symptoms are  rash. The primary symptoms do not include shortness of breath, cough, abdominal pain, nausea, vomiting, diarrhea or dizziness. The current episode started less than 1 hour ago. The problem has been gradually worsening. This is a new problem.  The rash is associated with itching.  The onset of the reaction was associated with eating. Significant symptoms also include flushing and itching.    Past Medical History  Diagnosis Date  . Diabetes mellitus   . Hypertension   . Depression   . Anxiety     Past Surgical History  Procedure Date  . Abdominal hysterectomy   . Carpal tunnel release   . Ulner nerve    . Tonsillectomy     No family history on file.  History  Substance Use Topics  . Smoking status: Never Smoker   . Smokeless tobacco: Not on file  . Alcohol Use: No    OB History    Grav Para Term Preterm Abortions TAB SAB Ect Mult Living                  Review of Systems  Constitutional: Negative for fever and chills.  HENT: Negative for congestion and sore throat.   Respiratory: Negative for cough and shortness of breath.   Cardiovascular: Negative for chest pain and leg swelling.  Gastrointestinal: Negative for nausea, vomiting, abdominal pain, diarrhea and constipation.  Genitourinary: Negative for dysuria and frequency.  Skin: Positive for color change, flushing, itching and rash.  Neurological: Negative for dizziness and headaches.  Psychiatric/Behavioral: Negative for confusion and agitation.    All other systems reviewed and are negative.    Allergies  Review of patient's allergies indicates no known allergies.  Home Medications   Current Outpatient Rx  Name  Route  Sig  Dispense  Refill  . ARIPIPRAZOLE 5 MG PO TABS   Oral   Take 5 mg by mouth daily.         . ATENOLOL 50 MG PO TABS   Oral   Take 50 mg by mouth daily.         Marland Kitchen GABAPENTIN 300 MG PO CAPS   Oral   Take 3 capsules (900 mg total) by mouth 3 (three) times daily.   54 capsule   0   . GLIPIZIDE 5 MG PO TABS   Oral   Take 5 mg by mouth 2 (two) times daily before a meal.         . LISINOPRIL 10 MG PO TABS   Oral   Take 10 mg by mouth daily.         Marland Kitchen METFORMIN HCL 1000 MG PO TABS   Oral   Take 1,000 mg by mouth 2 (two) times daily with a meal.         . OXCARBAZEPINE 150 MG PO TABS   Oral   Take 150 mg by mouth 2 (two) times daily.         . OXYCODONE-ACETAMINOPHEN 5-325 MG PO TABS   Oral   Take 1-2 tablets by mouth every  6 (six) hours as needed for pain.   23 tablet   0   . TRAZODONE HCL 100 MG PO TABS   Oral   Take 200 mg by mouth at bedtime.           BP 166/117  Pulse 63  Temp 98.2 F (36.8 C) (Oral)  Resp 20  SpO2 100%  Physical Exam  Vitals reviewed. Constitutional: She is oriented to person, place, and time. She appears well-developed and well-nourished. No distress.  HENT:  Head: Normocephalic and atraumatic.  Mouth/Throat: Oropharynx is clear and moist and mucous membranes are normal. No posterior oropharyngeal edema.  Eyes: EOM are normal. Pupils are equal, round, and reactive to light.  Neck: Normal range of motion. Neck supple.  Cardiovascular: Normal rate and regular rhythm.   Pulmonary/Chest: Effort normal. No respiratory distress. She has no wheezes.  Abdominal: Soft. She exhibits no distension.  Musculoskeletal: Normal range of motion. She exhibits no edema.  Neurological: She is alert and oriented to person, place, and time.  Skin: Skin is warm  and dry. No rash noted. Rash is not urticarial. There is erythema.  Psychiatric: She has a normal mood and affect. Her behavior is normal.    ED Course  Procedures (including critical care time)  Labs Reviewed - No data to display No results found.   No diagnosis found.    MDM  Pt w/ acute allergic rxn to shrimp. No prior hx of shell fish allergy. Sx started approx 45 minutes PTA, diffuse pruritis and erythema. Denies dyspnea, wheezing or oropharyngeal swelling. Admits to nausea, no vomiting.   Exam: normotensive, no tachypnea or hypoxia, oropharynx clear - no edema, lungs CTAB, no wheeze. No urticarial rash noted. Mild flushing of face and neck.   DDx/plan: likely allergic reaction to shell fish. Based on hx and exam does not appear to be anaphylaxis at this time. Will refrain from epinephrine. Will give benadryl, zantac and solumedrol and observe  Course: pt reassessed, vitals stable, no progression of sx. Pt states she feels better. Stable for d/c home. Given rx for benadryl Q8 x 3 days, prednisone 5 day taper and epi pen. Given return precautions and follow up instructions. D/c home in good condition.   1. Allergic reaction    New Prescriptions   DIPHENHYDRAMINE (BENADRYL) 25 MG TABLET    Take 1 tablet (25 mg total) by mouth every 8 (eight) hours as needed for itching. For 3 days   EPINEPHRINE (EPIPEN) 0.3 MG/0.3 ML DEVI    Inject 0.3 mLs (0.3 mg total) into the muscle once.   PREDNISONE (DELTASONE) 20 MG TABLET    3 tabs po day one, then 2 po daily x 4 days   [facility] EMERGENCY DEPARTMENT [address] [id number] mc [place] [address] [phone]           Audelia Hives, MD 06/28/12 [phone]

## 2012-06-28 NOTE — Discharge Instructions (Signed)
Allergic Reaction  Allergic reactions can be caused by anything your body is sensitive to. Your body may be sensitive to food, medicines, molds, pollens, cockroaches, dust mites, pets, insect stings, and other things around you. An allergic reaction may cause puffiness (swelling), itching, sneezing, coughing, or problems breathing.   Allergies cannot be cured, but they can be controlled with medicine. Some allergies happen only at certain times of the year. Try to stay away from what causes your reaction if possible. Sometimes, it is hard to tell what causes your reaction.  HOME CARE  If you have a rash or red patches (hives) on your skin:   Take medicines as told by your doctor.   Do not drive or drink alcohol after taking medicines. They can make you sleepy.   Put cold cloths on your skin. Take baths in cool water. This will help your itching. Do not take hot baths or showers. Heat will make the itching worse.   If your allergies get worse, your doctor might give you other medicines. Talk to your doctor if problems continue.  GET HELP RIGHT AWAY IF:    You have trouble breathing.   You have a tight feeling in your chest or throat.   Your mouth gets puffy (swollen).   You have red, itchy patches on your skin (hives) that get worse.   You have itching all over your body.  MAKE SURE YOU:    Understand these instructions.   Will watch your condition.   Will get help right away if you are not doing well or get worse.  Document Released: 05/25/2009 Document Revised: 05/26/2011 Document Reviewed: 05/25/2009  [employer] Patient Information 2012 [employer]

## 2012-06-28 NOTE — ED Notes (Signed)
The pt is in no distress sleeping

## 2012-06-28 NOTE — ED Notes (Signed)
MD at bedside. Resident aware of progressive worsening symptoms; no airway compromise

## 2012-06-28 NOTE — ED Notes (Signed)
Pts. Become more redened, swelling, arms are now more red, increased itching.

## 2012-06-28 NOTE — ED Notes (Signed)
The pt has had iv benadryl and solu-medrol.   The redness is receding and the itching is better.  Waiting for zantac to come from pharmacy

## 2012-06-28 NOTE — ED Notes (Signed)
The pts redness in her face has gone the swelling has also gone.  She is still itching  On her lower abd and legs

## 2012-06-28 NOTE — ED Notes (Signed)
The pt ate shrimp  approx 40 minutes prior to her arrival here.  She arrived with red swollen facial features and itching all over her body.  No diff breathing or swelling in her mouth.  No previous reaction to shrimp

## 2012-06-28 NOTE — ED Notes (Signed)
Ate some shrimp ~ 45 minutes ago. Scratching all over, redness, no airway compromise.

## 2012-06-29 NOTE — ED Provider Notes (Signed)
I saw and evaluated the patient, reviewed the resident's note and I agree with the findings and plan.  Tobin Chad, MD 06/29/12 [phone]

## 2012-09-24 ENCOUNTER — Ambulatory Visit: Payer: No Typology Code available for payment source | Attending: Orthopedic Surgery | Admitting: Physical Therapy

## 2012-09-24 DIAGNOSIS — M6281 Muscle weakness (generalized): Secondary | ICD-10-CM | POA: Insufficient documentation

## 2012-09-24 DIAGNOSIS — M25569 Pain in unspecified knee: Secondary | ICD-10-CM | POA: Insufficient documentation

## 2012-09-24 DIAGNOSIS — IMO0001 Reserved for inherently not codable concepts without codable children: Secondary | ICD-10-CM | POA: Insufficient documentation

## 2012-10-01 ENCOUNTER — Ambulatory Visit: Payer: No Typology Code available for payment source | Admitting: Physical Therapy

## 2012-10-03 ENCOUNTER — Ambulatory Visit: Payer: No Typology Code available for payment source | Admitting: Physical Therapy

## 2012-10-08 ENCOUNTER — Ambulatory Visit: Payer: No Typology Code available for payment source | Admitting: Physical Therapy

## 2012-10-10 ENCOUNTER — Ambulatory Visit: Payer: No Typology Code available for payment source | Admitting: Physical Therapy

## 2012-10-12 DIAGNOSIS — E119 Type 2 diabetes mellitus without complications: Secondary | ICD-10-CM | POA: Insufficient documentation

## 2012-10-12 DIAGNOSIS — I10 Essential (primary) hypertension: Secondary | ICD-10-CM | POA: Insufficient documentation

## 2012-10-12 DIAGNOSIS — F319 Bipolar disorder, unspecified: Secondary | ICD-10-CM | POA: Insufficient documentation

## 2012-10-15 ENCOUNTER — Ambulatory Visit: Payer: No Typology Code available for payment source | Admitting: Physical Therapy

## 2012-10-17 ENCOUNTER — Ambulatory Visit: Payer: No Typology Code available for payment source | Admitting: Physical Therapy

## 2012-10-22 ENCOUNTER — Ambulatory Visit: Payer: No Typology Code available for payment source | Admitting: Physical Therapy

## 2012-10-24 ENCOUNTER — Encounter: Payer: 59 | Admitting: Physical Therapy

## 2012-11-30 DIAGNOSIS — S93409A Sprain of unspecified ligament of unspecified ankle, initial encounter: Secondary | ICD-10-CM | POA: Insufficient documentation

## 2012-12-19 ENCOUNTER — Ambulatory Visit: Payer: Self-pay | Admitting: Physical Therapy

## 2012-12-25 ENCOUNTER — Ambulatory Visit: Payer: Self-pay | Attending: Orthopedic Surgery | Admitting: Physical Therapy

## 2012-12-25 DIAGNOSIS — IMO0001 Reserved for inherently not codable concepts without codable children: Secondary | ICD-10-CM | POA: Insufficient documentation

## 2012-12-25 DIAGNOSIS — M6281 Muscle weakness (generalized): Secondary | ICD-10-CM | POA: Insufficient documentation

## 2012-12-25 DIAGNOSIS — M25569 Pain in unspecified knee: Secondary | ICD-10-CM | POA: Insufficient documentation

## 2013-01-01 ENCOUNTER — Ambulatory Visit: Payer: Self-pay | Admitting: Physical Therapy

## 2013-01-03 ENCOUNTER — Ambulatory Visit: Payer: Self-pay | Admitting: Physical Therapy

## 2013-01-08 ENCOUNTER — Ambulatory Visit: Payer: Self-pay | Admitting: Physical Therapy

## 2013-01-11 ENCOUNTER — Ambulatory Visit: Payer: Self-pay

## 2013-01-16 ENCOUNTER — Ambulatory Visit: Payer: Self-pay | Admitting: Physical Therapy

## 2013-01-16 DIAGNOSIS — G629 Polyneuropathy, unspecified: Secondary | ICD-10-CM | POA: Insufficient documentation

## 2013-01-17 ENCOUNTER — Ambulatory Visit: Payer: Self-pay

## 2013-01-21 ENCOUNTER — Ambulatory Visit: Payer: Self-pay | Attending: Orthopedic Surgery | Admitting: Physical Therapy

## 2013-01-21 DIAGNOSIS — M25569 Pain in unspecified knee: Secondary | ICD-10-CM | POA: Insufficient documentation

## 2013-01-21 DIAGNOSIS — IMO0001 Reserved for inherently not codable concepts without codable children: Secondary | ICD-10-CM | POA: Insufficient documentation

## 2013-01-21 DIAGNOSIS — M6281 Muscle weakness (generalized): Secondary | ICD-10-CM | POA: Insufficient documentation

## 2013-01-24 ENCOUNTER — Ambulatory Visit: Payer: Self-pay | Admitting: Physical Therapy

## 2013-01-28 ENCOUNTER — Ambulatory Visit: Payer: Self-pay | Admitting: Physical Therapy

## 2013-01-31 ENCOUNTER — Ambulatory Visit: Payer: Self-pay | Admitting: Physical Therapy

## 2013-02-05 ENCOUNTER — Ambulatory Visit: Payer: Self-pay | Admitting: Physical Therapy

## 2013-02-07 ENCOUNTER — Ambulatory Visit: Payer: Self-pay

## 2013-02-12 ENCOUNTER — Encounter: Payer: 59 | Admitting: Physical Therapy

## 2013-02-14 ENCOUNTER — Encounter: Payer: 59 | Admitting: Physical Therapy

## 2013-02-27 ENCOUNTER — Ambulatory Visit: Payer: No Typology Code available for payment source | Attending: Orthopedic Surgery | Admitting: Physical Therapy

## 2013-02-27 DIAGNOSIS — M6281 Muscle weakness (generalized): Secondary | ICD-10-CM | POA: Insufficient documentation

## 2013-02-27 DIAGNOSIS — IMO0001 Reserved for inherently not codable concepts without codable children: Secondary | ICD-10-CM | POA: Insufficient documentation

## 2013-02-27 DIAGNOSIS — M25569 Pain in unspecified knee: Secondary | ICD-10-CM | POA: Insufficient documentation

## 2013-03-13 ENCOUNTER — Ambulatory Visit: Payer: No Typology Code available for payment source | Admitting: Physical Therapy

## 2013-03-19 ENCOUNTER — Ambulatory Visit: Payer: No Typology Code available for payment source | Admitting: Physical Therapy

## 2013-03-26 ENCOUNTER — Ambulatory Visit: Payer: No Typology Code available for payment source | Attending: Orthopedic Surgery | Admitting: Physical Therapy

## 2013-03-26 DIAGNOSIS — IMO0001 Reserved for inherently not codable concepts without codable children: Secondary | ICD-10-CM | POA: Insufficient documentation

## 2013-03-26 DIAGNOSIS — M25569 Pain in unspecified knee: Secondary | ICD-10-CM | POA: Insufficient documentation

## 2013-03-26 DIAGNOSIS — M6281 Muscle weakness (generalized): Secondary | ICD-10-CM | POA: Insufficient documentation

## 2013-03-27 ENCOUNTER — Ambulatory Visit: Payer: No Typology Code available for payment source | Admitting: Physical Therapy

## 2013-04-02 ENCOUNTER — Ambulatory Visit: Payer: No Typology Code available for payment source | Admitting: Physical Therapy

## 2013-04-04 ENCOUNTER — Ambulatory Visit: Payer: No Typology Code available for payment source | Admitting: Physical Therapy

## 2013-06-30 ENCOUNTER — Emergency Department (HOSPITAL_COMMUNITY)
Admission: EM | Admit: 2013-06-30 | Discharge: 2013-06-30 | Disposition: A | Discharge status: Home / Self Care | Payer: No Typology Code available for payment source | Attending: Emergency Medicine | Admitting: Emergency Medicine

## 2013-06-30 ENCOUNTER — Encounter (HOSPITAL_COMMUNITY): Payer: Self-pay | Admitting: Emergency Medicine

## 2013-06-30 DIAGNOSIS — E119 Type 2 diabetes mellitus without complications: Secondary | ICD-10-CM | POA: Insufficient documentation

## 2013-06-30 DIAGNOSIS — Z79899 Other long term (current) drug therapy: Secondary | ICD-10-CM | POA: Insufficient documentation

## 2013-06-30 DIAGNOSIS — F3289 Other specified depressive episodes: Secondary | ICD-10-CM | POA: Insufficient documentation

## 2013-06-30 DIAGNOSIS — R51 Headache: Secondary | ICD-10-CM | POA: Insufficient documentation

## 2013-06-30 DIAGNOSIS — R519 Headache, unspecified: Secondary | ICD-10-CM

## 2013-06-30 DIAGNOSIS — F411 Generalized anxiety disorder: Secondary | ICD-10-CM | POA: Insufficient documentation

## 2013-06-30 DIAGNOSIS — G8929 Other chronic pain: Secondary | ICD-10-CM | POA: Insufficient documentation

## 2013-06-30 DIAGNOSIS — IMO0002 Reserved for concepts with insufficient information to code with codable children: Secondary | ICD-10-CM | POA: Insufficient documentation

## 2013-06-30 DIAGNOSIS — F329 Major depressive disorder, single episode, unspecified: Secondary | ICD-10-CM | POA: Insufficient documentation

## 2013-06-30 DIAGNOSIS — I10 Essential (primary) hypertension: Secondary | ICD-10-CM | POA: Insufficient documentation

## 2013-06-30 HISTORY — DX: Headache: R51

## 2013-06-30 HISTORY — DX: Headache, unspecified: R51.9

## 2013-06-30 MED ORDER — DIPHENHYDRAMINE HCL 50 MG/ML IJ SOLN
25.0000 mg | Freq: Once | INTRAMUSCULAR | Status: AC
  Administered 2013-06-30: 25 mg via INTRAMUSCULAR
  Filled 2013-06-30: qty 1

## 2013-06-30 MED ORDER — DEXAMETHASONE SODIUM PHOSPHATE 4 MG/ML IJ SOLN
10.0000 mg | Freq: Once | INTRAMUSCULAR | Status: AC
  Administered 2013-06-30: 10 mg via INTRAMUSCULAR
  Filled 2013-06-30: qty 3

## 2013-06-30 MED ORDER — MORPHINE SULFATE 4 MG/ML IJ SOLN
4.0000 mg | Freq: Once | INTRAMUSCULAR | Status: AC
  Administered 2013-06-30: 4 mg via INTRAMUSCULAR
  Filled 2013-06-30: qty 1

## 2013-06-30 MED ORDER — METOCLOPRAMIDE HCL 5 MG/ML IJ SOLN
10.0000 mg | Freq: Once | INTRAMUSCULAR | Status: AC
  Administered 2013-06-30: 10 mg via INTRAMUSCULAR
  Filled 2013-06-30: qty 2

## 2013-06-30 NOTE — ED Provider Notes (Signed)
CSN: [id number]     Arrival date & time 06/30/13  1710 History   First MD Initiated Contact with Patient 06/30/13 1937     Chief Complaint  Patient presents with  . Headache   (Consider location/radiation/quality/duration/timing/severity/associated sxs/prior Treatment) Patient is a 53 y.o. female presenting with headaches. The history is provided by the patient.  Headache  patient here complaining of left-sided headache that started today approximately 8 hours ago. No recent history of trauma. Describes nausea but no vomiting. Does have a prior history of chronic headaches at the same location this is similar. Some photophobia without visual loss. No focal deficits. Denies any fever or neck pain. No treatment used prior to arrival.  Past Medical History  Diagnosis Date  . Diabetes mellitus   . Hypertension   . Depression   . Anxiety   . Headache    Past Surgical History  Procedure Laterality Date  . Abdominal hysterectomy    . Carpal tunnel release    . Ulner nerve     . Tonsillectomy    . Neck surgery    . Knee surgery Left    No family history on file. History  Substance Use Topics  . Smoking status: Never Smoker   . Smokeless tobacco: Not on file  . Alcohol Use: No   OB History   Grav Para Term Preterm Abortions TAB SAB Ect Mult Living                 Review of Systems  Neurological: Positive for headaches.  All other systems reviewed and are negative.    Allergies  Shrimp  Home Medications   Current Outpatient Rx  Name  Route  Sig  Dispense  Refill  . ARIPiprazole (ABILIFY) 5 MG tablet   Oral   Take 5 mg by mouth daily.         Marland Kitchen atenolol (TENORMIN) 50 MG tablet   Oral   Take 50 mg by mouth daily.         . diphenhydrAMINE (BENADRYL) 25 MG tablet   Oral   Take 1 tablet (25 mg total) by mouth every 8 (eight) hours as needed for itching. For 3 days   20 tablet   0   . EPINEPHrine (EPIPEN) 0.3 mg/0.3 mL DEVI   Intramuscular   Inject 0.3 mLs  (0.3 mg total) into the muscle once.   1 Device   1   . gabapentin (NEURONTIN) 300 MG capsule   Oral   Take 3 capsules (900 mg total) by mouth 3 (three) times daily.   54 capsule   0   . glipiZIDE (GLUCOTROL) 5 MG tablet   Oral   Take 5 mg by mouth 2 (two) times daily before a meal.         . lisinopril (PRINIVIL,ZESTRIL) 10 MG tablet   Oral   Take 10 mg by mouth daily.         . metFORMIN (GLUCOPHAGE) 1000 MG tablet   Oral   Take 1,000 mg by mouth 2 (two) times daily with a meal.         . Multiple Vitamin (MULTIVITAMIN WITH MINERALS) TABS   Oral   Take 1 tablet by mouth daily.         . OXcarbazepine (TRILEPTAL) 150 MG tablet   Oral   Take 150 mg by mouth 2 (two) times daily.         . predniSONE (DELTASONE) 20 MG tablet  3 tabs po day one, then 2 po daily x 4 days   11 tablet   0   . traZODone (DESYREL) 100 MG tablet   Oral   Take 200 mg by mouth at bedtime.          BP 189/95  Pulse 58  Temp(Src) 97.9 F (36.6 C) (Oral)  Resp 18  Ht 5' 6.5" (1.689 m)  Wt 245 lb (111.131 kg)  BMI 38.96 kg/m2  SpO2 99% Physical Exam  Nursing note and vitals reviewed. Constitutional: She is oriented to person, place, and time. She appears well-developed and well-nourished.  Non-toxic appearance. No distress.  HENT:  Head: Normocephalic and atraumatic.  Eyes: Conjunctivae, EOM and lids are normal. Pupils are equal, round, and reactive to light.  Neck: Normal range of motion. Neck supple. No tracheal deviation present. No mass present.  Cardiovascular: Normal rate, regular rhythm and normal heart sounds.  Exam reveals no gallop.   No murmur heard. Pulmonary/Chest: Effort normal and breath sounds normal. No stridor. No respiratory distress. She has no decreased breath sounds. She has no wheezes. She has no rhonchi. She has no rales.  Abdominal: Soft. Normal appearance and bowel sounds are normal. She exhibits no distension. There is no tenderness. There is no  rebound and no CVA tenderness.  Musculoskeletal: Normal range of motion. She exhibits no edema and no tenderness.  Neurological: She is alert and oriented to person, place, and time. She has normal strength. No cranial nerve deficit or sensory deficit. GCS eye subscore is 4. GCS verbal subscore is 5. GCS motor subscore is 6.  Skin: Skin is warm and dry. No abrasion and no rash noted.  Psychiatric: She has a normal mood and affect. Her speech is normal and behavior is normal.    ED Course  Procedures (including critical care time) Labs Review Labs Reviewed - No data to display Imaging Review No results found.  EKG Interpretation   None       MDM  No diagnosis found.  Patient given meds for headache and feels better. She has no red flags for subarachnoid hemorrhage. The current headache is similar to what she's had before in the past. Repeat neurological exam at time of discharge stable    Leota Jacobsen, MD 06/30/13 2233
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
## 2013-06-30 NOTE — Discharge Instructions (Signed)

## 2013-07-12 ENCOUNTER — Ambulatory Visit: Payer: No Typology Code available for payment source | Attending: Internal Medicine | Admitting: Internal Medicine

## 2013-07-12 ENCOUNTER — Encounter: Payer: Self-pay | Admitting: Internal Medicine

## 2013-07-12 VITALS — BP 154/89 | HR 66 | Temp 98.3°F | Resp 16 | Wt 254.4 lb

## 2013-07-12 DIAGNOSIS — I10 Essential (primary) hypertension: Secondary | ICD-10-CM

## 2013-07-12 DIAGNOSIS — F329 Major depressive disorder, single episode, unspecified: Secondary | ICD-10-CM

## 2013-07-12 DIAGNOSIS — F3289 Other specified depressive episodes: Secondary | ICD-10-CM

## 2013-07-12 DIAGNOSIS — F32A Depression, unspecified: Secondary | ICD-10-CM

## 2013-07-12 DIAGNOSIS — Z139 Encounter for screening, unspecified: Secondary | ICD-10-CM

## 2013-07-12 DIAGNOSIS — G4700 Insomnia, unspecified: Secondary | ICD-10-CM

## 2013-07-12 DIAGNOSIS — E119 Type 2 diabetes mellitus without complications: Secondary | ICD-10-CM

## 2013-07-12 LAB — CBC WITH DIFFERENTIAL/PLATELET
Basophils Absolute: 0 10*3/uL (ref 0.0–0.1)
Basophils Relative: 0 % (ref 0–1)
EOS ABS: 0.1 10*3/uL (ref 0.0–0.7)
Eosinophils Relative: 1 % (ref 0–5)
HCT: 39.9 % (ref 36.0–46.0)
Hemoglobin: 13.4 g/dL (ref 12.0–15.0)
LYMPHS ABS: 2.6 10*3/uL (ref 0.7–4.0)
LYMPHS PCT: 34 % (ref 12–46)
MCH: 32.5 pg (ref 26.0–34.0)
MCHC: 33.6 g/dL (ref 30.0–36.0)
MCV: 96.8 fL (ref 78.0–100.0)
Monocytes Absolute: 0.6 10*3/uL (ref 0.1–1.0)
Monocytes Relative: 7 % (ref 3–12)
NEUTROS PCT: 58 % (ref 43–77)
Neutro Abs: 4.5 10*3/uL (ref 1.7–7.7)
Platelets: 164 10*3/uL (ref 150–400)
RBC: 4.12 MIL/uL (ref 3.87–5.11)
RDW: 14.4 % (ref 11.5–15.5)
WBC: 7.8 10*3/uL (ref 4.0–10.5)

## 2013-07-12 LAB — COMPLETE METABOLIC PANEL WITH GFR
ALK PHOS: 86 U/L (ref 39–117)
ALT: 15 U/L (ref 0–35)
AST: 17 U/L (ref 0–37)
Albumin: 4 g/dL (ref 3.5–5.2)
BUN: 16 mg/dL (ref 6–23)
CO2: 28 meq/L (ref 19–32)
Calcium: 9.1 mg/dL (ref 8.4–10.5)
Chloride: 101 mEq/L (ref 96–112)
Creat: 1.04 mg/dL (ref 0.50–1.10)
GFR, Est African American: 71 mL/min
GFR, Est Non African American: 61 mL/min
Glucose, Bld: 102 mg/dL — ABNORMAL HIGH (ref 70–99)
Potassium: 4.5 mEq/L (ref 3.5–5.3)
SODIUM: 137 meq/L (ref 135–145)
TOTAL PROTEIN: 6.3 g/dL (ref 6.0–8.3)
Total Bilirubin: 0.5 mg/dL (ref 0.3–1.2)

## 2013-07-12 LAB — LIPID PANEL
CHOL/HDL RATIO: 4.1 ratio
CHOLESTEROL: 134 mg/dL (ref 0–200)
HDL: 33 mg/dL — AB (ref >39)
LDL CALC: 37 mg/dL (ref 0–99)
Triglycerides: 322 mg/dL — ABNORMAL HIGH (ref <150)
VLDL: 64 mg/dL — AB (ref 0–40)

## 2013-07-12 LAB — POCT GLYCOSYLATED HEMOGLOBIN (HGB A1C): HEMOGLOBIN A1C: 5.7

## 2013-07-12 LAB — GLUCOSE, POCT (MANUAL RESULT ENTRY): POC Glucose: 90 mg/dl (ref 70–99)

## 2013-07-12 MED ORDER — METFORMIN HCL 1000 MG PO TABS: 1000.0000 mg | ORAL_TABLET | Freq: Two times a day (BID) | ORAL | Status: DC

## 2013-07-12 MED ORDER — LISINOPRIL 30 MG PO TABS: 30.0000 mg | ORAL_TABLET | Freq: Every day | ORAL | Status: DC

## 2013-07-12 MED ORDER — GLIPIZIDE 5 MG PO TABS: 5.0000 mg | ORAL_TABLET | Freq: Two times a day (BID) | ORAL | Status: DC

## 2013-07-12 MED ORDER — ATENOLOL 50 MG PO TABS: 50.0000 mg | ORAL_TABLET | Freq: Every day | ORAL | Status: DC

## 2013-07-12 MED ORDER — GABAPENTIN 300 MG PO CAPS: 900.0000 mg | ORAL_CAPSULE | Freq: Three times a day (TID) | ORAL | Status: DC

## 2013-07-12 NOTE — Progress Notes (Signed)
Patient Demographics  Briana Alvarez, is a 53 y.o. female  POE:[id number]  RXV:[id number]  DOB - Nov 22, 1960  CC:  Chief Complaint  Patient presents with  . new patient  . Diabetes       HPI: Briana Alvarez is a 53 y.o. female here today to establish medical care. She has history of diabetes hypertension, anxiety depression insomnia, neuropathy on Neurontin, as per patient her previous doctor also prescribed Cymbalta for neuropathy which she has not started yet which as per her psychiatrist also will help her with depression. Patient has been taking her diabetes medication and denies any hypoglycemic symptoms. Patient has No headache, No chest pain, No abdominal pain - No Nausea, No new weakness tingling or numbness, No Cough - SOB.  Allergies  Allergen Reactions  . Shrimp [Shellfish Allergy] Anaphylaxis   Past Medical History  Diagnosis Date  . Diabetes mellitus   . Hypertension   . Depression   . Anxiety   . Headache    Current Outpatient Prescriptions on File Prior to Visit  Medication Sig Dispense Refill  . ARIPiprazole (ABILIFY) 10 MG tablet Take 10 mg by mouth at bedtime.      Marland Kitchen EPINEPHrine (EPIPEN) 0.3 mg/0.3 mL DEVI Inject 0.3 mLs (0.3 mg total) into the muscle once.  1 Device  1  . ibuprofen (ADVIL,MOTRIN) 800 MG tablet Take 800 mg by mouth every 8 (eight) hours as needed for moderate pain.      . Multiple Vitamin (MULTIVITAMIN WITH MINERALS) TABS Take 1 tablet by mouth daily.      . traZODone (DESYREL) 100 MG tablet Take 150 mg by mouth at bedtime. Take 2 po at qhs for total of 300mg        No current facility-administered medications on file prior to visit.   Family History  Problem Relation Age of Onset  . Cancer Mother   . Diabetes Daughter   . Diabetes Maternal Grandmother    History   Social History  . Marital Status: Divorced    Spouse Name: N/A    Number of Children: N/A  . Years of Education: N/A   Occupational History  . Not on file.   Social  History Main Topics  . Smoking status: Never Smoker   . Smokeless tobacco: Not on file  . Alcohol Use: No  . Drug Use: No  . Sexual Activity: Not on file   Other Topics Concern  . Not on file   Social History Narrative  . No narrative on file    Review of Systems: Constitutional: Negative for fever, chills, diaphoresis, activity change, appetite change and fatigue. HENT: Negative for ear pain, nosebleeds, congestion, facial swelling, rhinorrhea, neck pain, neck stiffness and ear discharge.  Eyes: Negative for pain, discharge, redness, itching and visual disturbance. Respiratory: Negative for cough, choking, chest tightness, shortness of breath, wheezing and stridor.  Cardiovascular: Negative for chest pain, palpitations and leg swelling. Gastrointestinal: Negative for abdominal distention. Genitourinary: Negative for dysuria, urgency, frequency, hematuria, flank pain, decreased urine volume, difficulty urinating and dyspareunia.  Musculoskeletal: Negative for back pain, joint swelling, arthralgia and gait problem. Neurological: Negative for dizziness, tremors, seizures, syncope, facial asymmetry, speech difficulty, weakness, light-headedness, numbness and headaches.  Hematological: Negative for adenopathy. Does not bruise/bleed easily. Psychiatric/Behavioral: Negative for hallucinations, behavioral problems, confusion, dysphoric mood, decreased concentration and agitation.    Objective:   Filed Vitals:   07/12/13 1529  BP: 154/89  Pulse: 66  Temp: 98.3 F (36.8 C)  Resp: 16  Physical Exam: Constitutional: Patient appears well-developed and well-nourished. No distress. HENT: Normocephalic, atraumatic, External right and left ear normal. Oropharynx is clear and moist.  Eyes: Conjunctivae and EOM are normal. PERRLA, no scleral icterus. Neck: Normal ROM. Neck supple. No JVD. No tracheal deviation. No thyromegaly. CVS: RRR, S1/S2 +, no murmurs, no gallops, no carotid bruit.   Pulmonary: Effort and breath sounds normal, no stridor, rhonchi, wheezes, rales.  Abdominal: Soft. BS +, no distension, tenderness, rebound or guarding.  Musculoskeletal: Normal range of motion. No edema and no tenderness.  Skin: Skin is warm and dry. No rash noted. Not diaphoretic. No erythema. No pallor. Psychiatric: Normal mood and affect. Behavior, judgment, thought content normal.  Lab Results  Component Value Date   WBC 5.7 04/21/2011   HGB 12.2 04/21/2011   HCT 37.6 04/21/2011   MCV 99.7 04/21/2011   PLT 113* 04/21/2011   Lab Results  Component Value Date   CREATININE 0.94 04/19/2011   BUN 21 04/19/2011   NA 135 04/19/2011   K 3.9 04/19/2011   CL 101 04/19/2011   CO2 20 04/19/2011    Lab Results  Component Value Date   HGBA1C 5.7 07/12/2013   Lipid Panel     Component Value Date/Time   CHOL 138 04/20/2011 0547   TRIG 97 04/20/2011 0547   HDL 40 04/20/2011 0547   CHOLHDL 3.5 04/20/2011 0547   VLDL 19 04/20/2011 0547   LDLCALC 79 04/20/2011 0547       Assessment and plan:   1. DM (diabetes mellitus)  Well-controlled. - Glucose (CBG) - HgB A1c 5.7% - Ambulatory referral to Podiatry - Ambulatory referral to Ophthalmology - metFORMIN (GLUCOPHAGE) 1000 MG tablet; Take 1 tablet (1,000 mg total) by mouth 2 (two) times daily with a meal.  Dispense: 60 tablet; Refill: 3 - glipiZIDE (GLUCOTROL) 5 MG tablet; Take 1 tablet (5 mg total) by mouth 2 (two) times daily before a meal.  Dispense: 60 tablet; Refill: 3 - gabapentin (NEURONTIN) 300 MG capsule; Take 3 capsules (900 mg total) by mouth 3 (three) times daily.  Dispense: 270 capsule; Refill: 0  2. Essential hypertension, benign Uncontrolled, continue with Tenormin, I have increased the dose of lisinopril to 30 mg  - atenolol (TENORMIN) 50 MG tablet; Take 1 tablet (50 mg total) by mouth daily.  Dispense: 30 tablet; Refill: 3 - lisinopril (PRINIVIL,ZESTRIL) 30 MG tablet; Take 1 tablet (30 mg total) by mouth daily.   Dispense: 30 tablet; Refill: 4  3. Insomnia /4. Depression  Patient is following up with psychiatry and is on Abilify and trazodone.  5. Screening  - CBC with Differential - COMPLETE METABOLIC PANEL WITH GFR - TSH - Lipid panel - Vit D  25 hydroxy (rtn osteoporosis monitoring)  Return in about 6 weeks (around 08/23/2013).   Lorayne Marek, MD

## 2013-07-12 NOTE — Patient Instructions (Signed)
2 Gram Low Sodium Diet A 2 gram sodium diet restricts the amount of sodium in the diet to no more than 2 g or 2000 mg daily. Limiting the amount of sodium is often used to help lower blood pressure. It is important if you have heart, liver, or kidney problems. Many foods contain sodium for flavor and sometimes as a preservative. When the amount of sodium in a diet needs to be low, it is important to know what to look for when choosing foods and drinks. The following includes some information and guidelines to help make it easier for you to adapt to a low sodium diet. QUICK TIPS  Do not add salt to food.  Avoid convenience items and fast food.  Choose unsalted snack foods.  Buy lower sodium products, often labeled as "lower sodium" or "no salt added."  Check food labels to learn how much sodium is in 1 serving.  When eating at a restaurant, ask that your food be prepared with less salt or none, if possible. READING FOOD LABELS FOR SODIUM INFORMATION The nutrition facts label is a good place to find how much sodium is in foods. Look for products with no more than 500 to 600 mg of sodium per meal and no more than 150 mg per serving. Remember that 2 g = 2000 mg. The food label may also list foods as:  Sodium-free: Less than 5 mg in a serving.  Very low sodium: 35 mg or less in a serving.  Low-sodium: 140 mg or less in a serving.  Light in sodium: 50% less sodium in a serving. For example, if a food that usually has 300 mg of sodium is changed to become light in sodium, it will have 150 mg of sodium.  Reduced sodium: 25% less sodium in a serving. For example, if a food that usually has 400 mg of sodium is changed to reduced sodium, it will have 300 mg of sodium. CHOOSING FOODS Grains  Avoid: Salted crackers and snack items. Some cereals, including instant hot cereals. Bread stuffing and biscuit mixes. Seasoned rice or pasta mixes.  Choose: Unsalted snack items. Low-sodium cereals, oats,  puffed wheat and rice, shredded wheat. English muffins and bread. Pasta. Meats  Avoid: Salted, canned, smoked, spiced, pickled meats, including fish and poultry. Bacon, ham, sausage, cold cuts, hot dogs, anchovies.  Choose: Low-sodium canned tuna and salmon. Fresh or frozen meat, poultry, and fish. Dairy  Avoid: Processed cheese and spreads. Cottage cheese. Buttermilk and condensed milk. Regular cheese.  Choose: Milk. Low-sodium cottage cheese. Yogurt. Sour cream. Low-sodium cheese. Fruits and Vegetables  Avoid: Regular canned vegetables. Regular canned tomato sauce and paste. Frozen vegetables in sauces. Olives. Pickles. Relishes. Sauerkraut.  Choose: Low-sodium canned vegetables. Low-sodium tomato sauce and paste. Frozen or fresh vegetables. Fresh and frozen fruit. Condiments  Avoid: Canned and packaged gravies. Worcestershire sauce. Tartar sauce. Barbecue sauce. Soy sauce. Steak sauce. Ketchup. Onion, garlic, and table salt. Meat flavorings and tenderizers.  Choose: Fresh and dried herbs and spices. Low-sodium varieties of mustard and ketchup. Lemon juice. Tabasco sauce. Horseradish. SAMPLE 2 GRAM SODIUM MEAL PLAN Breakfast / Sodium (mg)  1 cup low-fat milk / 143 mg  2 slices whole-wheat toast / 270 mg  1 tbs heart-healthy margarine / 153 mg  1 hard-boiled egg / 139 mg  1 small orange / 0 mg Lunch / Sodium (mg)  1 cup raw carrots / 76 mg   cup hummus / 298 mg  1 cup low-fat milk /   143 mg   cup red grapes / 2 mg  1 whole-wheat pita bread / 356 mg Dinner / Sodium (mg)  1 cup whole-wheat pasta / 2 mg  1 cup low-sodium tomato sauce / 73 mg  3 oz lean ground beef / 57 mg  1 small side salad (1 cup raw spinach leaves,  cup cucumber,  cup yellow bell pepper) with 1 tsp olive oil and 1 tsp red wine vinegar / 25 mg Snack / Sodium (mg)  1 container low-fat vanilla yogurt / 107 mg  3 graham cracker squares / 127 mg Nutrient Analysis  Calories: 2033  Protein:  77 g  Carbohydrate: 282 g  Fat: 72 g  Sodium: 1971 mg Document Released: 06/06/2005 Document Revised: 08/29/2011 Document Reviewed: 09/07/2009 [employer] Patient Information 2014 [employer].  

## 2013-07-12 NOTE — Progress Notes (Signed)
Patient here to establish care Has history of DM HTN

## 2013-07-13 LAB — VITAMIN D 25 HYDROXY (VIT D DEFICIENCY, FRACTURES): Vit D, 25-Hydroxy: 34 ng/mL (ref 30–89)

## 2013-07-13 LAB — TSH: TSH: 2.582 u[IU]/mL (ref 0.350–4.500)

## 2013-07-15 ENCOUNTER — Telehealth: Payer: Self-pay | Admitting: *Deleted

## 2013-07-15 ENCOUNTER — Ambulatory Visit: Payer: No Typology Code available for payment source | Attending: Internal Medicine

## 2013-07-15 NOTE — Telephone Encounter (Signed)
I spoke to the pt and I informed her that her triglycerides were elevated I advised patient for low fat diet.

## 2013-07-15 NOTE — Telephone Encounter (Signed)
Message copied by Joan Mayans on Mon Jul 15, 2013  3:43 PM ------      Message from: Lorayne Marek      Created: Mon Jul 15, 2013 11:42 AM       Blood work reviewed, noticed elevated triglycerides, advise patient for low fat diet.       ------

## 2013-07-16 ENCOUNTER — Other Ambulatory Visit: Payer: Self-pay | Admitting: Internal Medicine

## 2013-07-16 MED ORDER — DULOXETINE HCL 60 MG PO CPEP: 60.0000 mg | ORAL_CAPSULE | Freq: Every day | ORAL | Status: DC

## 2013-07-17 ENCOUNTER — Telehealth: Payer: Self-pay | Admitting: Internal Medicine

## 2013-07-17 ENCOUNTER — Telehealth: Payer: Self-pay

## 2013-07-17 NOTE — Telephone Encounter (Signed)
Message copied by Dorothe Pea on Wed Jul 17, 2013  9:31 AM ------      Message from: Lorayne Marek      Created: Mon Jul 15, 2013 11:42 AM       Blood work reviewed, noticed elevated triglycerides, advise patient for low fat diet.       ------

## 2013-07-17 NOTE — Telephone Encounter (Signed)
Returned patient phone call Patient is aware of her lab results 

## 2013-07-17 NOTE — Telephone Encounter (Signed)
Patient not available  Left message on machine to return our call 

## 2013-07-17 NOTE — Telephone Encounter (Signed)
Pt called regarding her lab results, please contact pt

## 2013-07-23 ENCOUNTER — Ambulatory Visit: Payer: No Typology Code available for payment source

## 2013-08-09 ENCOUNTER — Other Ambulatory Visit: Payer: Self-pay | Admitting: Internal Medicine

## 2013-08-20 ENCOUNTER — Ambulatory Visit: Payer: No Typology Code available for payment source

## 2013-08-20 VITALS — BP 139/76 | HR 52 | Resp 16 | Ht 66.0 in | Wt 245.0 lb

## 2013-08-20 DIAGNOSIS — M79673 Pain in unspecified foot: Secondary | ICD-10-CM

## 2013-08-20 DIAGNOSIS — E1149 Type 2 diabetes mellitus with other diabetic neurological complication: Secondary | ICD-10-CM

## 2013-08-20 DIAGNOSIS — M2040 Other hammer toe(s) (acquired), unspecified foot: Secondary | ICD-10-CM

## 2013-08-20 DIAGNOSIS — E1142 Type 2 diabetes mellitus with diabetic polyneuropathy: Secondary | ICD-10-CM

## 2013-08-20 DIAGNOSIS — M79609 Pain in unspecified limb: Secondary | ICD-10-CM

## 2013-08-20 DIAGNOSIS — E1140 Type 2 diabetes mellitus with diabetic neuropathy, unspecified: Secondary | ICD-10-CM

## 2013-08-20 DIAGNOSIS — G5760 Lesion of plantar nerve, unspecified lower limb: Secondary | ICD-10-CM

## 2013-08-20 MED ORDER — MELOXICAM 15 MG PO TABS: 15.0000 mg | ORAL_TABLET | Freq: Every day | ORAL | Status: DC

## 2013-08-20 NOTE — Progress Notes (Signed)
   Subjective:    Patient ID: Briana Alvarez, female    DOB: 18-Oct-1960, 53 y.o.   MRN: [id number]  HPI Comments: "The balls of my feet hurt"  Patient c/o numbness, tingling, and burning sensations plantar forefoot bilateral for 1 year. The pain is constant. Worse when walking. She has seen her PCP and he put her on Cymbalta 3 weeks ago, along with taking her Neurontin since 2011. She states it has helped.  Patient states that her last A1C was 5.0  Foot Pain Associated symptoms include fatigue and headaches.      Review of Systems  Constitutional: Positive for fatigue and unexpected weight change.  HENT: Positive for tinnitus.   Eyes: Positive for visual disturbance.  Endocrine: Positive for polyphagia.  Musculoskeletal: Positive for back pain.  Neurological: Positive for headaches.  Hematological: Bruises/bleeds easily.  All other systems reviewed and are negative.       Objective:   Physical Exam Vascular status appears to be intact although diminished PT +2/4 bilateral PT thready one over 4 on the left and nonpalpable on the right. Refill timed 3-4 seconds all digits temperature is warm to cool turgor diminished there still some mild hair growth on dorsum of the foot but absent to the digits. Capillary refill timed again 3-4 seconds all digits orthopedic biomechanical exam rectus foot type mild promontory changes x-rays reveal no signs of fracture or osseous abnormality sesamoid position 3 bilateral hammertoes 2 through 5 bilateral there is inferior retrocalcaneal spurring on both heels and some mild subluxation Lisfranc for fifth metatarsal base and cuboid on x-ray views. Patient is roughly wide splay forefoot. However she is wearing a pair [place] [employer] that her inch to narrow for her foot. Patient has pain on direct lateral compression the forefoot second and third interspaces bilateral exquisite painful and tender on palpation and with enclosed shoe wear. Patient and I walk in her  heels the side of her foot. Neurologically epicritic and proprioceptive sensations appear to be intact there is some paresthesia on palpation and percussion of the digits vibratory sensation intact muscle strength intact and symmetric bilateral. Epicritic sensation is decreased in the distal digits and forefoot plantar arch area. Patient also some difficulty walking shoe about 2 blocks before should stop and taken break possibly early claudication symptoms may ward future followup with vascular consultation some point in the near future. Patient does have a steroid injection of her shoulder yesterday as such we'll try to avoid any additional steroids as to not elevate her blood sugars at this time we'll try nonsteroidal anti-inflammatories first \      Assessment & Plan:  Assessment this time his diabetes with peripheral neuropathy. Patient also has possible Norton Morton's neuroma symptomology second third interspaces bilateral painful on direct lateral compression and on exam. At this time may recommendations for wider more accommodative shoes or compress ice pack daily a prescription for meloxicam 15 mg once daily is dispensed recheck within 2-3 weeks for followup again stressed wide a coming shoes suggested crocs for around the house may be candidate for steroid injections in the future or more noninvasive invasive options as well as possible future after consultation.  Harriet Masson DPM

## 2013-08-20 NOTE — Patient Instructions (Addendum)
Diabetes and Foot Care Diabetes may cause you to have problems because of poor blood supply (circulation) to your feet and legs. This may cause the skin on your feet to become thinner, break easier, and heal more slowly. Your skin may become dry, and the skin may peel and crack. You may also have nerve damage in your legs and feet causing decreased feeling in them. You may not notice minor injuries to your feet that could lead to infections or more serious problems. Taking care of your feet is one of the most important things you can do for yourself.  HOME CARE INSTRUCTIONS  Wear shoes at all times, even in the house. Do not go barefoot. Bare feet are easily injured.  Check your feet daily for blisters, cuts, and redness. If you cannot see the bottom of your feet, use a mirror or ask someone for help.  Wash your feet with warm water (do not use hot water) and mild soap. Then pat your feet and the areas between your toes until they are completely dry. Do not soak your feet as this can dry your skin.  Apply a moisturizing lotion or petroleum jelly (that does not contain alcohol and is unscented) to the skin on your feet and to dry, brittle toenails. Do not apply lotion between your toes.  Trim your toenails straight across. Do not dig under them or around the cuticle. File the edges of your nails with an emery board or nail file.  Do not cut corns or calluses or try to remove them with medicine.  Wear clean socks or stockings every day. Make sure they are not too tight. Do not wear knee-high stockings since they may decrease blood flow to your legs.  Wear shoes that fit properly and have enough cushioning. To break in new shoes, wear them for just a few hours a day. This prevents you from injuring your feet. Always look in your shoes before you put them on to be sure there are no objects inside.  Do not cross your legs. This may decrease the blood flow to your feet.  If you find a minor scrape,  cut, or break in the skin on your feet, keep it and the skin around it clean and dry. These areas may be cleansed with mild soap and water. Do not cleanse the area with peroxide, alcohol, or iodine.  When you remove an adhesive bandage, be sure not to damage the skin around it.  If you have a wound, look at it several times a day to make sure it is healing.  Do not use heating pads or hot water bottles. They may burn your skin. If you have lost feeling in your feet or legs, you may not know it is happening until it is too late.  Make sure your health care provider performs a complete foot exam at least annually or more often if you have foot problems. Report any cuts, sores, or bruises to your health care provider immediately. SEEK MEDICAL CARE IF:   You have an injury that is not healing.  You have cuts or breaks in the skin.  You have an ingrown nail.  You notice redness on your legs or feet.  You feel burning or tingling in your legs or feet.  You have pain or cramps in your legs and feet.  Your legs or feet are numb.  Your feet always feel cold. SEEK IMMEDIATE MEDICAL CARE IF:   There is increasing redness,   swelling, or pain in or around a wound.  There is a red line that goes up your leg.  Pus is coming from a wound.  You develop a fever or as directed by your health care provider.  You notice a bad smell coming from an ulcer or wound. Document Released: 06/03/2000 Document Revised: 02/06/2013 Document Reviewed: 11/13/2012 [facility] Patient Information 2014 [place]. Morton's Neuroma Neuralgia (nerve pain) or neuroma (benign [non-cancerous] nerve tumor) may develop on any interdigital nerve. The interdigital nerves (nerves between digits) of the foot travel beneath and between the metatarsals (long bones of the fore foot) and pass the nerve endings to the toes. The third interdigital is a common place for a small neuroma to form called Morton's neuroma. Another  nerve to be affected commonly is the fourth interdigital nerve. This would be in approximately in the area of the base or ball under the bottom of your fourth toe. This condition occurs more commonly in women and is usually on one side. It is usually first noticed by pain radiating (spreading) to the ball of the foot or to the toes. CAUSES The cause of interdigital neuralgia may be from low grade repetitive trauma (damage caused by an accident) as in activities causing a repeated pounding of the foot (running, jumping etc.). It is also caused by improper footwear or recent loss of the fatty padding on the bottom of the foot. TREATMENT  The condition often resolves (goes away) simply with decreasing activity if that is thought to be the cause. Proper shoes are beneficial. Orthotics (special foot support aids) such as a metatarsal bar are often beneficial. This condition usually responds to conservative therapy, however if surgery is necessary it usually brings complete relief. HOME CARE INSTRUCTIONS  Apply ice to the area of soreness for 15-20 minutes, 03-04 times per day, while awake for the first 2 days. Put ice in a plastic bag and place a towel between the bag of ice and your skin. Only take over-the-counter or prescription medicines for pain, discomfort, or fever as directed by your caregiver. MAKE SURE YOU:  Understand these instructions. Will watch your condition. Will get help right away if you are not doing well or get worse. Document Released: 09/12/2000 Document Revised: 08/29/2011 Document Reviewed: 06/06/2005 [facility] Patient Information 2014 [place].    Recommendation is to wear a wide boxy or square toe shoe with a thick soled and lace up Velcro type closure. May also consider things like crocs or Birkenstock's. Or new balance athletic type shoe 800 or higher make sure you have appropriate with

## 2013-08-27 ENCOUNTER — Ambulatory Visit: Payer: No Typology Code available for payment source | Admitting: Internal Medicine

## 2013-09-10 ENCOUNTER — Ambulatory Visit: Payer: No Typology Code available for payment source

## 2013-09-19 ENCOUNTER — Other Ambulatory Visit: Payer: Self-pay | Admitting: Internal Medicine

## 2013-09-27 ENCOUNTER — Ambulatory Visit: Payer: No Typology Code available for payment source

## 2013-10-08 ENCOUNTER — Ambulatory Visit (INDEPENDENT_AMBULATORY_CARE_PROVIDER_SITE_OTHER): Payer: No Typology Code available for payment source

## 2013-10-08 VITALS — BP 146/80 | HR 60 | Resp 18 | Ht 66.5 in | Wt 245.0 lb

## 2013-10-08 DIAGNOSIS — E1140 Type 2 diabetes mellitus with diabetic neuropathy, unspecified: Secondary | ICD-10-CM

## 2013-10-08 DIAGNOSIS — E1149 Type 2 diabetes mellitus with other diabetic neurological complication: Secondary | ICD-10-CM

## 2013-10-08 DIAGNOSIS — E1142 Type 2 diabetes mellitus with diabetic polyneuropathy: Secondary | ICD-10-CM

## 2013-10-08 DIAGNOSIS — L03039 Cellulitis of unspecified toe: Secondary | ICD-10-CM

## 2013-10-08 DIAGNOSIS — L600 Ingrowing nail: Secondary | ICD-10-CM

## 2013-10-08 DIAGNOSIS — G5760 Lesion of plantar nerve, unspecified lower limb: Secondary | ICD-10-CM

## 2013-10-08 MED ORDER — CEPHALEXIN 500 MG PO CAPS: 500.0000 mg | ORAL_CAPSULE | Freq: Three times a day (TID) | ORAL | Status: DC

## 2013-10-08 NOTE — Patient Instructions (Addendum)
ANTIBACTERIAL SOAP INSTRUCTIONS  THE DAY AFTER PROCEDURE  Please follow the instructions your doctor has marked.   Shower as usual. Before getting out, place a drop of antibacterial liquid soap (Dial) on a wet, clean washcloth.  Gently wipe washcloth over affected area.  Afterward, rinse the area with warm water.  Blot the area dry with a soft cloth and cover with antibiotic ointment (neosporin, polysporin, bacitracin) and band aid or gauze and tape  Place 3-4 drops of antibacterial liquid soap in a quart of warm tap water.  Submerge foot into water for 20 minutes.  If bandage was applied after your procedure, leave on to allow for easy lift off, then remove and continue with soak for the remaining time.  Next, blot area dry with a soft cloth and cover with a bandage.  Apply other medications as directed by your doctor, such as cortisporin otic solution (eardrops) or neosporin antibiotic ointment As alternative may substitute Epsom salts in warm water for soaking 2 tablespoons of Epsom salts and to a pan of warm water and soap for 10-15 minutes as instructed and apply Neosporin and Band-Aid dressing as instructed  Recommended Tylenol as needed for pain 2, 3 times daily as needed for pain

## 2013-10-08 NOTE — Progress Notes (Signed)
   Subjective:    Patient ID: Briana Alvarez, female    DOB: [birth year], 53 y.o.   MRN: [id number]  HPI Comments: Pt states she has a sore right 1st toe for about 1 week, complains of swelling, redness and pain.     Review of Systems no new systemic changes or findings noted     Objective:   Physical Exam Epicritic and proprioceptive sensations show hypersensitivity patient masker status is intact pedal pulses palpable DP postal for PT plus one over 4 bilateral has significant pain on direct lateral compression third interspaces although the entire forefoot has paresthesias burning and stinging consistent with diabetic neuropathy to patient also is a new problem ingrowing nail with hangnail lateral nail border of the right great toe both medial lateral borders have an ingrown the past cause pain discomfort patient [place] Lopes for permanent nail excision at partial nail excision of the right hallux nail borders. Remainder of exam unremarkable rectus foot type ankle mid tarsus subtalar joint motions normal dermatologically no other open wounds or ulcerations there is granulation tissue and dropfoot and erythema lateral nail fold right great toe.       Assessment & Plan:  Assessment patient is persistent diabetic neuropathy type pain she is on Relafen high-level gabapentin and Cymbalta suggested she talk with her primary physician about possibly considering we are occurs alternative to the gabapentin may be more helpful with neuroma type symptomology or the nerve symptomology her patient has no significant pain third interspace as a trial is injected 10 mg Kenalog 20 mg Xylocaine plain infiltrated into the therapeutic nerve block injection to the third intermetatarsal space third common digital nerve bilateral. Patient also given local block and Betadine prep and the right hallux nail medial lateral borders were excised with pedal meniscectomy for about: Silvadene cream and gauze dressing being applied  patient given instructions for daily soaking the doctors of water will maintain antibiotic prescription for cephalexin recommended, as needed for pain recheck in 3 weeks for nail check on partial nail excision the lateral border right hallux. Next  Harriet Masson DPM

## 2013-10-11 ENCOUNTER — Other Ambulatory Visit: Payer: Self-pay | Admitting: Internal Medicine

## 2013-10-21 ENCOUNTER — Telehealth: Payer: Self-pay | Admitting: *Deleted

## 2013-10-21 NOTE — Telephone Encounter (Signed)
I had surgery on my right big toe.  I been taking the medicine and doing what he said. It' is still really red and sore.  Please call me back.  I left her a message to call and schedule an appointment for this week with Dr. Blenda Mounts.

## 2013-10-22 ENCOUNTER — Ambulatory Visit (INDEPENDENT_AMBULATORY_CARE_PROVIDER_SITE_OTHER): Payer: No Typology Code available for payment source

## 2013-10-22 VITALS — BP 143/74 | HR 60 | Temp 98.8°F | Resp 18 | Ht 66.5 in | Wt 245.0 lb

## 2013-10-22 DIAGNOSIS — Z09 Encounter for follow-up examination after completed treatment for conditions other than malignant neoplasm: Secondary | ICD-10-CM

## 2013-10-22 DIAGNOSIS — L600 Ingrowing nail: Secondary | ICD-10-CM

## 2013-10-22 NOTE — Patient Instructions (Signed)
ANTIBACTERIAL SOAP INSTRUCTIONS  THE DAY AFTER PROCEDURE  Please follow the instructions your doctor has marked.   Shower as usual. Before getting out, place a drop of antibacterial liquid soap (Dial) on a wet, clean washcloth.  Gently wipe washcloth over affected area.  Afterward, rinse the area with warm water.  Blot the area dry with a soft cloth and cover with antibiotic ointment (neosporin, polysporin, bacitracin) and band aid or gauze and tape  Place 3-4 drops of antibacterial liquid soap in a quart of warm tap water.  Submerge foot into water for 20 minutes.  If bandage was applied after your procedure, leave on to allow for easy lift off, then remove and continue with soak for the remaining time.  Next, blot area dry with a soft cloth and cover with a bandage.  Apply other medications as directed by your doctor, such as cortisporin otic solution (eardrops) or neosporin antibiotic ointment  Or may substitute Epsom salts in warm water soak and Neosporin and Band-Aid during the day keep the Neosporin and Band-Aid on at all times during the day keep the Band-Aid off at night when it breather air dry cannot walk around without the Band-Aid until completely dry and healed

## 2013-10-22 NOTE — Progress Notes (Signed)
   Subjective:    Patient ID: Briana Alvarez, female    DOB: 1960-09-30, 53 y.o.   MRN: [id number]  HPI Comments: Pt presents for follow up of ingrown toenail surgical procedure without a dressing.  Pt states she only dressed the right 1st toe for 2 days.  Pt's right 1st toe is red swollen and painful at the lateral border, and has a oozing bloody discharge for the medial toenail border.     Review of Systems no new systemic changes or findings noted     Objective:   Physical Exam Neurovascular status is intact pedal pulses palpable epicritic and proprioceptive sensations intact patient does have some drainage from the medial nail fold lateral nail folds shows eschar she indicates there still some tenderness or swelling in the lateral nail lateral proximal nail fold of the right great toe which underwent AP nail procedure 2 weeks ago. Exam reveals that she stopped doing the bandages soaking it just a couple of days she check she indicates she still has some antibiotic left which indicates she did not take her doses appropriately advised to complete her antibiotic regimen and needs to resume daily soaking either Epson salts or antibacterial soap and warm water once or twice daily apply Neosporin or Polysporin and Band-Aid dressing daily complete her antibiotic regimen as instructed keep a Band-Aid on during the day where dry at night only patient may be having some slight phenol reaction from the phenol burn the proximal nail fold which is slightly tender although there is no ascending sides no lymphangitis no increased temperature pallor no other discolorations noted no malodor no purulent drainage is noted       Assessment & Plan:  Assessment good but slow postop progress following AP nail procedure patient prematurely discontinue soaking it may not complete antibiotic regimen advised to continue with antibiotics finishing completely also do soaking as instructed daily for another week to 2-3 weeks  until completely dry the results recheck his knee with in 2-3 weeks if no improvement  Harriet Masson DPM

## 2013-10-29 ENCOUNTER — Ambulatory Visit: Payer: No Typology Code available for payment source

## 2013-11-15 ENCOUNTER — Other Ambulatory Visit: Payer: Self-pay | Admitting: Internal Medicine

## 2013-11-15 DIAGNOSIS — I10 Essential (primary) hypertension: Secondary | ICD-10-CM

## 2013-11-15 DIAGNOSIS — E1140 Type 2 diabetes mellitus with diabetic neuropathy, unspecified: Secondary | ICD-10-CM

## 2013-11-15 DIAGNOSIS — E119 Type 2 diabetes mellitus without complications: Secondary | ICD-10-CM

## 2013-12-23 ENCOUNTER — Other Ambulatory Visit: Payer: Self-pay | Admitting: Internal Medicine

## 2013-12-27 ENCOUNTER — Other Ambulatory Visit: Payer: Self-pay

## 2013-12-27 DIAGNOSIS — E0843 Diabetes mellitus due to underlying condition with diabetic autonomic (poly)neuropathy: Secondary | ICD-10-CM

## 2013-12-27 MED ORDER — GABAPENTIN 300 MG PO CAPS: ORAL_CAPSULE | ORAL | Status: DC

## 2013-12-27 MED ORDER — GLIPIZIDE 5 MG PO TABS: ORAL_TABLET | ORAL | Status: DC

## 2013-12-27 NOTE — Addendum Note (Signed)
Addended by: Theodis Shove on: 12/27/2013 03:54 PM   Modules accepted: Orders

## 2014-01-03 ENCOUNTER — Emergency Department (HOSPITAL_COMMUNITY)
Admission: EM | Admit: 2014-01-03 | Discharge: 2014-01-03 | Disposition: A | Discharge status: Home / Self Care | Payer: No Typology Code available for payment source | Attending: Emergency Medicine | Admitting: Emergency Medicine

## 2014-01-03 ENCOUNTER — Encounter (HOSPITAL_COMMUNITY): Payer: Self-pay | Admitting: Emergency Medicine

## 2014-01-03 DIAGNOSIS — E119 Type 2 diabetes mellitus without complications: Secondary | ICD-10-CM | POA: Insufficient documentation

## 2014-01-03 DIAGNOSIS — Z792 Long term (current) use of antibiotics: Secondary | ICD-10-CM | POA: Insufficient documentation

## 2014-01-03 DIAGNOSIS — Z79899 Other long term (current) drug therapy: Secondary | ICD-10-CM | POA: Insufficient documentation

## 2014-01-03 DIAGNOSIS — F3289 Other specified depressive episodes: Secondary | ICD-10-CM | POA: Insufficient documentation

## 2014-01-03 DIAGNOSIS — K644 Residual hemorrhoidal skin tags: Secondary | ICD-10-CM | POA: Insufficient documentation

## 2014-01-03 DIAGNOSIS — I10 Essential (primary) hypertension: Secondary | ICD-10-CM | POA: Insufficient documentation

## 2014-01-03 DIAGNOSIS — F329 Major depressive disorder, single episode, unspecified: Secondary | ICD-10-CM | POA: Insufficient documentation

## 2014-01-03 DIAGNOSIS — F411 Generalized anxiety disorder: Secondary | ICD-10-CM | POA: Insufficient documentation

## 2014-01-03 MED ORDER — HYDROCORTISONE ACETATE 25 MG RE SUPP
25.0000 mg | Freq: Two times a day (BID) | RECTAL | Status: DC
Start: 2014-01-03 — End: 2014-03-06

## 2014-01-03 NOTE — ED Notes (Signed)
Pt states that she woke up and went to the bathroom and had bright red blood in her stool. Pt states that she has used 3 sanitary napkins today to soak up the blood. Pt states that she is not menstruating, but that she has a hx of hemorrhoids.

## 2014-01-03 NOTE — ED Provider Notes (Signed)
CSN: [id number]     Arrival date & time 01/03/14  1725 History   First MD Initiated Contact with Patient 01/03/14 2049     Chief Complaint  Patient presents with  . Hemorrhoids     (Consider location/radiation/quality/duration/timing/severity/associated sxs/prior Treatment) The history is provided by the patient.   Briana Alvarez is a 53 y.o. female complaints of intermittent hemorrhoidal bleeding for one week. No similar problem in the past. She denies constipation, fever, chills, nausea, vomiting, weakness, or dizziness. There are no other known modifying factors.    Past Medical History  Diagnosis Date  . Diabetes mellitus   . Hypertension   . Depression   . Anxiety   . Headache    Past Surgical History  Procedure Laterality Date  . Abdominal hysterectomy    . Carpal tunnel release    . Ulner nerve     . Tonsillectomy    . Neck surgery    . Knee surgery Left    Family History  Problem Relation Age of Onset  . Cancer Mother   . Diabetes Daughter   . Diabetes Maternal Grandmother    History  Substance Use Topics  . Smoking status: Never Smoker   . Smokeless tobacco: Not on file  . Alcohol Use: No   OB History   Grav Para Term Preterm Abortions TAB SAB Ect Mult Living                 Review of Systems  All other systems reviewed and are negative.     Allergies  Shrimp  Home Medications   Prior to Admission medications   Medication Sig Start Date End Date Taking? Authorizing Provider  ARIPiprazole (ABILIFY) 10 MG tablet Take 10 mg by mouth at bedtime.   Yes Historical Provider, MD  atenolol (TENORMIN) 50 MG tablet Take 50 mg by mouth daily.   Yes Historical Provider, MD  DULoxetine (CYMBALTA) 60 MG capsule Take 1 capsule (60 mg total) by mouth daily. 07/16/13  Yes Angelica Chessman, MD  EPINEPHrine (EPIPEN) 0.3 mg/0.3 mL DEVI Inject 0.3 mLs (0.3 mg total) into the muscle once. 06/28/12  Yes Ernst Spell, MD  gabapentin (NEURONTIN) 300 MG capsule Take  900 mg by mouth 3 (three) times daily. 12/27/13  Yes Deepak Advani, MD  glipiZIDE (GLUCOTROL) 5 MG tablet Take 5 mg by mouth daily. 12/27/13  Yes Deepak Advani, MD  lisinopril (PRINIVIL,ZESTRIL) 30 MG tablet Take 1 tablet (30 mg total) by mouth daily. 07/12/13  Yes Lorayne Marek, MD  metFORMIN (GLUCOPHAGE) 1000 MG tablet Take 1,000 mg by mouth 2 (two) times daily with a meal.   Yes Historical Provider, MD  traZODone (DESYREL) 150 MG tablet Take 150 mg by mouth at bedtime.   Yes Historical Provider, MD  cephALEXin (KEFLEX) 500 MG capsule Take 500 mg by mouth 3 (three) times daily. Patient completed course of medication back in June 2015 10/08/13   Harriet Masson, DPM  hydrocortisone (ANUSOL-HC) 25 MG suppository Place 1 suppository (25 mg total) rectally 2 (two) times daily. For 7 days 01/03/14   Richarda Blade, MD   BP 138/85  Pulse 57  Temp(Src) 98.1 F (36.7 C) (Oral)  Resp 18  Ht 5\' 6"  (1.676 m)  Wt 235 lb (106.595 kg)  BMI 37.95 kg/m2  SpO2 97% Physical Exam  Nursing note and vitals reviewed. Constitutional: She is oriented to person, place, and time. She appears well-developed and well-nourished.  HENT:  Head: Normocephalic and atraumatic.  Eyes: Conjunctivae  and EOM are normal. Pupils are equal, round, and reactive to light.  Neck: Normal range of motion and phonation normal. Neck supple.  Cardiovascular: Normal rate.   Pulmonary/Chest: Effort normal and breath sounds normal. She exhibits no tenderness.  Genitourinary:  Several external hemorrhoids, soft. One of the hemorrhoids is bleeding. There is no evidence for thrombosis. On digital rectal examination there are no palpable internal hemorrhoids, fecal impaction or rectal mass  Musculoskeletal: Normal range of motion.  Neurological: She is alert and oriented to person, place, and time. She exhibits normal muscle tone.  Skin: Skin is warm and dry.  Psychiatric: She has a normal mood and affect. Her behavior is normal. Judgment and  thought content normal.    ED Course  Procedures (including critical care time) Labs Review Labs Reviewed - No data to display  Imaging Review No results found.   EKG Interpretation None      MDM   Final diagnoses:  External hemorrhoid, bleeding    Mild hemorrhoidal bleeding. No evidence for thrombosis.   Nursing Notes Reviewed/ Care Coordinated Applicable Imaging Reviewed Interpretation of Laboratory Data incorporated into ED treatment  The patient appears reasonably screened and/or stabilized for discharge and I doubt any other medical condition or other [facility] requiring further screening, evaluation, or treatment in the ED at this time prior to discharge.  Plan: Home Medications- Hydrocortisone Suppository; Home Treatments- warm soaks; return here if the recommended treatment, does not improve the symptoms; Recommended follow up- PCP prn    Richarda Blade, MD 01/03/14 2117

## 2014-01-03 NOTE — Discharge Instructions (Signed)
Soak in a tub, once or twice a day. Clean with a moist towelette after bowel movements.   Hemorrhoids Hemorrhoids are swollen veins around the rectum or anus. There are two types of hemorrhoids:   Internal hemorrhoids. These occur in the veins just inside the rectum. They may poke through to the outside and become irritated and painful.  External hemorrhoids. These occur in the veins outside the anus and can be felt as a painful swelling or hard lump near the anus. CAUSES  Pregnancy.   Obesity.   Constipation or diarrhea.   Straining to have a bowel movement.   Sitting for long periods on the toilet.  Heavy lifting or other activity that caused you to strain.  Anal intercourse. SYMPTOMS   Pain.   Anal itching or irritation.   Rectal bleeding.   Fecal leakage.   Anal swelling.   One or more lumps around the anus.  DIAGNOSIS  Your caregiver may be able to diagnose hemorrhoids by visual examination. Other examinations or tests that may be performed include:   Examination of the rectal area with a gloved hand (digital rectal exam).   Examination of anal canal using a small tube (scope).   A blood test if you have lost a significant amount of blood.  A test to look inside the colon (sigmoidoscopy or colonoscopy). TREATMENT Most hemorrhoids can be treated at home. However, if symptoms do not seem to be getting better or if you have a lot of rectal bleeding, your caregiver may perform a procedure to help make the hemorrhoids get smaller or remove them completely. Possible treatments include:   Placing a rubber band at the base of the hemorrhoid to cut off the circulation (rubber band ligation).   Injecting a chemical to shrink the hemorrhoid (sclerotherapy).   Using a tool to burn the hemorrhoid (infrared light therapy).   Surgically removing the hemorrhoid (hemorrhoidectomy).   Stapling the hemorrhoid to block blood flow to the tissue (hemorrhoid  stapling).  HOME CARE INSTRUCTIONS   Eat foods with fiber, such as whole grains, beans, nuts, fruits, and vegetables. Ask your doctor about taking products with added fiber in them (fibersupplements).  Increase fluid intake. Drink enough water and fluids to keep your urine clear or pale yellow.   Exercise regularly.   Go to the bathroom when you have the urge to have a bowel movement. Do not wait.   Avoid straining to have bowel movements.   Keep the anal area dry and clean. Use wet toilet paper or moist towelettes after a bowel movement.   Medicated creams and suppositories may be used or applied as directed.   Only take over-the-counter or prescription medicines as directed by your caregiver.   Take warm sitz baths for 15-20 minutes, 3-4 times a day to ease pain and discomfort.   Place ice packs on the hemorrhoids if they are tender and swollen. Using ice packs between sitz baths may be helpful.   Put ice in a plastic bag.   Place a towel between your skin and the bag.   Leave the ice on for 15-20 minutes, 3-4 times a day.   Do not use a donut-shaped pillow or sit on the toilet for long periods. This increases blood pooling and pain.  SEEK MEDICAL CARE IF:  You have increasing pain and swelling that is not controlled by treatment or medicine.  You have uncontrolled bleeding.  You have difficulty or you are unable to have a bowel movement.  You have pain or inflammation outside the area of the hemorrhoids. MAKE SURE YOU:  Understand these instructions.  Will watch your condition.  Will get help right away if you are not doing well or get worse. Document Released: 06/03/2000 Document Revised: 05/23/2012 Document Reviewed: 04/10/2012 [facility] Patient Information 2015 [place], [place]. This information is not intended to replace advice given to you by your health care provider. Make sure you discuss any questions you have with your health care provider.

## 2014-01-03 NOTE — ED Notes (Addendum)
She states "my hemorrhoids are acting up today. They are bleeding so bad i have to wear a sanitary pad." she states the pain is mild

## 2014-01-08 ENCOUNTER — Encounter: Payer: Self-pay | Admitting: Internal Medicine

## 2014-01-08 ENCOUNTER — Ambulatory Visit: Payer: No Typology Code available for payment source | Attending: Internal Medicine | Admitting: Internal Medicine

## 2014-01-08 VITALS — BP 141/90 | HR 78 | Temp 98.5°F | Resp 16 | Wt 249.2 lb

## 2014-01-08 DIAGNOSIS — Z972 Presence of dental prosthetic device (complete) (partial): Secondary | ICD-10-CM

## 2014-01-08 DIAGNOSIS — I10 Essential (primary) hypertension: Secondary | ICD-10-CM | POA: Insufficient documentation

## 2014-01-08 DIAGNOSIS — M545 Low back pain, unspecified: Secondary | ICD-10-CM | POA: Insufficient documentation

## 2014-01-08 DIAGNOSIS — Z139 Encounter for screening, unspecified: Secondary | ICD-10-CM

## 2014-01-08 DIAGNOSIS — E089 Diabetes mellitus due to underlying condition without complications: Secondary | ICD-10-CM

## 2014-01-08 DIAGNOSIS — F3289 Other specified depressive episodes: Secondary | ICD-10-CM | POA: Insufficient documentation

## 2014-01-08 DIAGNOSIS — G8929 Other chronic pain: Secondary | ICD-10-CM | POA: Insufficient documentation

## 2014-01-08 DIAGNOSIS — F329 Major depressive disorder, single episode, unspecified: Secondary | ICD-10-CM | POA: Insufficient documentation

## 2014-01-08 DIAGNOSIS — Z98811 Dental restoration status: Secondary | ICD-10-CM

## 2014-01-08 DIAGNOSIS — E119 Type 2 diabetes mellitus without complications: Secondary | ICD-10-CM | POA: Insufficient documentation

## 2014-01-08 DIAGNOSIS — F411 Generalized anxiety disorder: Secondary | ICD-10-CM | POA: Insufficient documentation

## 2014-01-08 DIAGNOSIS — E781 Pure hyperglyceridemia: Secondary | ICD-10-CM | POA: Insufficient documentation

## 2014-01-08 DIAGNOSIS — Z791 Long term (current) use of non-steroidal anti-inflammatories (NSAID): Secondary | ICD-10-CM | POA: Insufficient documentation

## 2014-01-08 DIAGNOSIS — E139 Other specified diabetes mellitus without complications: Secondary | ICD-10-CM

## 2014-01-08 DIAGNOSIS — R3915 Urgency of urination: Secondary | ICD-10-CM

## 2014-01-08 LAB — POCT URINALYSIS DIPSTICK
BILIRUBIN UA: NEGATIVE
Blood, UA: NEGATIVE
Glucose, UA: NEGATIVE
KETONES UA: NEGATIVE
Leukocytes, UA: NEGATIVE
Nitrite, UA: NEGATIVE
PROTEIN UA: NEGATIVE
SPEC GRAV UA: 1.025
Urobilinogen, UA: 0.2
pH, UA: 7

## 2014-01-08 LAB — GLUCOSE, POCT (MANUAL RESULT ENTRY): POC Glucose: 122 mg/dl — AB (ref 70–99)

## 2014-01-08 LAB — POCT GLYCOSYLATED HEMOGLOBIN (HGB A1C): Hemoglobin A1C: 6.5

## 2014-01-08 MED ORDER — NAPROXEN 500 MG PO TABS: 500.0000 mg | ORAL_TABLET | Freq: Two times a day (BID) | ORAL | Status: DC

## 2014-01-08 NOTE — Progress Notes (Signed)
Patient here for follow up on her diabetes 

## 2014-01-08 NOTE — Progress Notes (Signed)
MRN: [id number] Name: Briana Alvarez  Sex: female Age: 53 y.o. DOB: 1961/04/07  Allergies: Shrimp  Chief Complaint  Patient presents with  . Follow-up    HPI: Patient is 53 y.o. female who has history of diabetes hypertension depression comes today for followup as per patient she has been taking metformin and Glucotrol denies any hypoglycemic symptoms, denies any headache dizziness chest and shortness of breath reported to have some urinary urgency denies any dysuria, also has chronic low back pain as per patient she was following up with orthopedics in the past and had MRI done, she used to take ibuprofen in the past. Currently she's also taking Cymbalta and Neurontin.  Past Medical History  Diagnosis Date  . Diabetes mellitus   . Hypertension   . Depression   . Anxiety   . Headache     Past Surgical History  Procedure Laterality Date  . Abdominal hysterectomy    . Carpal tunnel release    . Ulner nerve     . Tonsillectomy    . Neck surgery    . Knee surgery Left       Medication List       This list is accurate as of: 01/08/14 11:07 AM.  Always use your most recent med list.               ARIPiprazole 10 MG tablet  Commonly known as:  ABILIFY  Take 10 mg by mouth at bedtime.     atenolol 50 MG tablet  Commonly known as:  TENORMIN  Take 50 mg by mouth daily.     cephALEXin 500 MG capsule  Commonly known as:  KEFLEX  Take 500 mg by mouth 3 (three) times daily. Patient completed course of medication back in June 2015     DULoxetine 60 MG capsule  Commonly known as:  CYMBALTA  Take 1 capsule (60 mg total) by mouth daily.     EPINEPHrine 0.3 mg/0.3 mL Devi  Commonly known as:  EPIPEN  Inject 0.3 mLs (0.3 mg total) into the muscle once.     gabapentin 300 MG capsule  Commonly known as:  NEURONTIN  Take 900 mg by mouth 3 (three) times daily.     glipiZIDE 5 MG tablet  Commonly known as:  GLUCOTROL  Take 5 mg by mouth daily.     hydrocortisone 25 MG  suppository  Commonly known as:  ANUSOL-HC  Place 1 suppository (25 mg total) rectally 2 (two) times daily. For 7 days     lisinopril 30 MG tablet  Commonly known as:  PRINIVIL,ZESTRIL  Take 1 tablet (30 mg total) by mouth daily.     metFORMIN 1000 MG tablet  Commonly known as:  GLUCOPHAGE  Take 1,000 mg by mouth 2 (two) times daily with a meal.     naproxen 500 MG tablet  Commonly known as:  NAPROSYN  Take 1 tablet (500 mg total) by mouth 2 (two) times daily with a meal.     traZODone 150 MG tablet  Commonly known as:  DESYREL  Take 150 mg by mouth at bedtime.        Meds ordered this encounter  Medications  . naproxen (NAPROSYN) 500 MG tablet    Sig: Take 1 tablet (500 mg total) by mouth 2 (two) times daily with a meal.    Dispense:  30 tablet    Refill:  2     There is no immunization history on file for  this patient.  Family History  Problem Relation Age of Onset  . Cancer Mother   . Diabetes Daughter   . Diabetes Maternal Grandmother     History  Substance Use Topics  . Smoking status: Never Smoker   . Smokeless tobacco: Not on file  . Alcohol Use: No    Review of Systems   As noted in HPI  Filed Vitals:   01/08/14 1036  BP: 141/90  Pulse: 78  Temp: 98.5 F (36.9 C)  Resp: 16    Physical Exam  Physical Exam  HENT:  Patient is wearing dentures   Eyes: EOM are normal. Pupils are equal, round, and reactive to light.  Neck: Neck supple.  Cardiovascular: Normal rate and regular rhythm.   Pulmonary/Chest: Breath sounds normal. No respiratory distress. She has no wheezes. She has no rales.  Musculoskeletal: She exhibits no edema.  Some lower lumbar spinal and paraspinal tenderness     CBC    Component Value Date/Time   WBC 7.8 07/12/2013 1606   RBC 4.12 07/12/2013 1606   HGB 13.4 07/12/2013 1606   HCT 39.9 07/12/2013 1606   PLT 164 07/12/2013 1606   MCV 96.8 07/12/2013 1606   LYMPHSABS 2.6 07/12/2013 1606   MONOABS 0.6 07/12/2013 1606    EOSABS 0.1 07/12/2013 1606   BASOSABS 0.0 07/12/2013 1606    CMP     Component Value Date/Time   NA 137 07/12/2013 1606   K 4.5 07/12/2013 1606   CL 101 07/12/2013 1606   CO2 28 07/12/2013 1606   GLUCOSE 102* 07/12/2013 1606   BUN 16 07/12/2013 1606   CREATININE 1.04 07/12/2013 1606   CREATININE 0.94 04/19/2011 2049   CALCIUM 9.1 07/12/2013 1606   PROT 6.3 07/12/2013 1606   ALBUMIN 4.0 07/12/2013 1606   AST 17 07/12/2013 1606   ALT 15 07/12/2013 1606   ALKPHOS 86 07/12/2013 1606   BILITOT 0.5 07/12/2013 1606   GFRNONAA 61 07/12/2013 1606   GFRNONAA 69* 04/19/2011 2049   GFRAA 71 07/12/2013 1606   GFRAA 80* 04/19/2011 2049    Lab Results  Component Value Date/Time   CHOL 134 07/12/2013  4:06 PM    No components found with this basename: hga1c    Lab Results  Component Value Date/Time   AST 17 07/12/2013  4:06 PM    Assessment and Plan  Diabetes mellitus due to underlying condition without complications - Plan:  Results for orders placed in visit on 01/08/14  GLUCOSE, POCT (MANUAL RESULT ENTRY)      Result Value Ref Range   POC Glucose 122 (*) 70 - 99 mg/dl  POCT GLYCOSYLATED HEMOGLOBIN (HGB A1C)      Result Value Ref Range   Hemoglobin A1C 6.5    POCT URINALYSIS DIPSTICK      Result Value Ref Range   Color, UA yellow     Clarity, UA clear     Glucose, UA neg     Bilirubin, UA neg     Ketones, UA neg     Spec Grav, UA 1.025     Blood, UA neg     pH, UA 7.0     Protein, UA neg     Urobilinogen, UA 0.2     Nitrite, UA neg     Leukocytes, UA Negative     Urinary urgency - Plan: Urinalysis Dipstick Negative for infection  Her hemoglobin A1c is 6.5%, I have advised patient for diet modification continue with metformin and Glucotrol.  Essential hypertension, benign Improved compared to last visit continue with DASH diet and atenolol and lisinopril.  Hypertriglyceridemia Advised patient for low fat diet.  Special screening for malignant neoplasms, colon  Wears  dentures - Plan: Ambulatory referral to Dentistry  Chronic low back pain - Plan: naproxen (NAPROSYN) 500 MG tablet, Ambulatory referral to Orthopedic Surgery   Screening - Plan: MM DIGITAL SCREENING BILATERAL   Health Maintenance -Colonoscopy: uptodate   -Mammogram: ordered   Return in about 3 months (around 04/10/2014) for diabetes, hypertension.  Lorayne Marek, MD

## 2014-01-08 NOTE — Patient Instructions (Addendum)
Fat and Cholesterol Control Diet Fat and cholesterol levels in your blood and organs are influenced by your diet. High levels of fat and cholesterol may lead to diseases of the heart, small and large blood vessels, gallbladder, liver, and pancreas. CONTROLLING FAT AND CHOLESTEROL WITH DIET Although exercise and lifestyle factors are important, your diet is key. That is because certain foods are known to raise cholesterol and others to lower it. The goal is to balance foods for their effect on cholesterol and more importantly, to replace saturated and trans fat with other types of fat, such as monounsaturated fat, polyunsaturated fat, and omega-3 fatty acids. On average, a person should consume no more than 15 to 17 g of saturated fat daily. Saturated and trans fats are considered "bad" fats, and they will raise LDL cholesterol. Saturated fats are primarily found in animal products such as meats, butter, and cream. However, that does not mean you need to give up all your favorite foods. Today, there are good tasting, low-fat, low-cholesterol substitutes for most of the things you like to eat. Choose low-fat or nonfat alternatives. Choose round or loin cuts of red meat. These types of cuts are lowest in fat and cholesterol. Chicken (without the skin), fish, veal, and ground turkey breast are great choices. Eliminate fatty meats, such as hot dogs and salami. Even shellfish have little or no saturated fat. Have a 3 oz (85 g) portion when you eat lean meat, poultry, or fish. Trans fats are also called "partially hydrogenated oils." They are oils that have been scientifically manipulated so that they are solid at room temperature resulting in a longer shelf life and improved taste and texture of foods in which they are added. Trans fats are found in stick margarine, some tub margarines, cookies, crackers, and baked goods.  When baking and cooking, oils are a great substitute for butter. The monounsaturated oils are  especially beneficial since it is believed they lower LDL and raise HDL. The oils you should avoid entirely are saturated tropical oils, such as coconut and palm.  Remember to eat a lot from food groups that are naturally free of saturated and trans fat, including fish, fruit, vegetables, beans, grains (barley, rice, couscous, bulgur wheat), and pasta (without cream sauces).  IDENTIFYING FOODS THAT LOWER FAT AND CHOLESTEROL  Soluble fiber may lower your cholesterol. This type of fiber is found in fruits such as apples, vegetables such as broccoli, potatoes, and carrots, legumes such as beans, peas, and lentils, and grains such as barley. Foods fortified with plant sterols (phytosterol) may also lower cholesterol. You should eat at least 2 g per day of these foods for a cholesterol lowering effect.  Read package labels to identify low-saturated fats, trans fat free, and low-fat foods at the supermarket. Select cheeses that have only 2 to 3 g saturated fat per ounce. Use a heart-healthy tub margarine that is free of trans fats or partially hydrogenated oil. When buying baked goods (cookies, crackers), avoid partially hydrogenated oils. Breads and muffins should be made from whole grains (whole-wheat or whole oat flour, instead of "flour" or "enriched flour"). Buy non-creamy canned soups with reduced salt and no added fats.  FOOD PREPARATION TECHNIQUES  Never deep-fry. If you must fry, either stir-fry, which uses very little fat, or use non-stick cooking sprays. When possible, broil, bake, or roast meats, and steam vegetables. Instead of putting butter or margarine on vegetables, use lemon and herbs, applesauce, and cinnamon (for squash and sweet potatoes). Use nonfat   yogurt, salsa, and low-fat dressings for salads.  LOW-SATURATED FAT / LOW-FAT FOOD SUBSTITUTES Meats / Saturated Fat (g)  Avoid: Steak, marbled (3 oz/85 g) / 11 g  Choose: Steak, lean (3 oz/85 g) / 4 g  Avoid: Hamburger (3 oz/85 g) / 7  g  Choose: Hamburger, lean (3 oz/85 g) / 5 g  Avoid: Ham (3 oz/85 g) / 6 g  Choose: Ham, lean cut (3 oz/85 g) / 2.4 g  Avoid: Chicken, with skin, dark meat (3 oz/85 g) / 4 g  Choose: Chicken, skin removed, dark meat (3 oz/85 g) / 2 g  Avoid: Chicken, with skin, light meat (3 oz/85 g) / 2.5 g  Choose: Chicken, skin removed, light meat (3 oz/85 g) / 1 g Dairy / Saturated Fat (g)  Avoid: Whole milk (1 cup) / 5 g  Choose: Low-fat milk, 2% (1 cup) / 3 g  Choose: Low-fat milk, 1% (1 cup) / 1.5 g  Choose: Skim milk (1 cup) / 0.3 g  Avoid: Hard cheese (1 oz/28 g) / 6 g  Choose: Skim milk cheese (1 oz/28 g) / 2 to 3 g  Avoid: Cottage cheese, 4% fat (1 cup) / 6.5 g  Choose: Low-fat cottage cheese, 1% fat (1 cup) / 1.5 g  Avoid: Ice cream (1 cup) / 9 g  Choose: Sherbet (1 cup) / 2.5 g  Choose: Nonfat frozen yogurt (1 cup) / 0.3 g  Choose: Frozen fruit bar / trace  Avoid: Whipped cream (1 tbs) / 3.5 g  Choose: Nondairy whipped topping (1 tbs) / 1 g Condiments / Saturated Fat (g)  Avoid: Mayonnaise (1 tbs) / 2 g  Choose: Low-fat mayonnaise (1 tbs) / 1 g  Avoid: Butter (1 tbs) / 7 g  Choose: Extra light margarine (1 tbs) / 1 g  Avoid: Coconut oil (1 tbs) / 11.8 g  Choose: Olive oil (1 tbs) / 1.8 g  Choose: Corn oil (1 tbs) / 1.7 g  Choose: Safflower oil (1 tbs) / 1.2 g  Choose: Sunflower oil (1 tbs) / 1.4 g  Choose: Soybean oil (1 tbs) / 2.4 g  Choose: Canola oil (1 tbs) / 1 g Document Released: 06/06/2005 Document Revised: 10/01/2012 Document Reviewed: 11/25/2010 ExitCare Patient Information 2015 [place], [place]. This information is not intended to replace advice given to you by your health care provider. Make sure you discuss any questions you have with your health care provider. DASH Eating Plan DASH stands for "Dietary Approaches to Stop Hypertension." The DASH eating plan is a healthy eating plan that has been shown to reduce high blood pressure  (hypertension). Additional health benefits may include reducing the risk of type 2 diabetes mellitus, heart disease, and stroke. The DASH eating plan may also help with weight loss. WHAT DO I NEED TO KNOW ABOUT THE DASH EATING PLAN? For the DASH eating plan, you will follow these general guidelines:  Choose foods with a percent daily value for sodium of less than 5% (as listed on the food label).  Use salt-free seasonings or herbs instead of table salt or sea salt.  Check with your health care provider or pharmacist before using salt substitutes.  Eat lower-sodium products, often labeled as "lower sodium" or "no salt added."  Eat fresh foods.  Eat more vegetables, fruits, and low-fat dairy products.  Choose whole grains. Look for the word "whole" as the first word in the ingredient list.  Choose fish and skinless chicken or [place] more often than  red meat. Limit fish, poultry, and meat to 6 oz (170 g) each day.  Limit sweets, desserts, sugars, and sugary drinks.  Choose heart-healthy fats.  Limit cheese to 1 oz (28 g) per day.  Eat more home-cooked food and less restaurant, buffet, and fast food.  Limit fried foods.  Cook foods using methods other than frying.  Limit canned vegetables. If you do use them, rinse them well to decrease the sodium.  When eating at a restaurant, ask that your food be prepared with less salt, or no salt if possible. WHAT FOODS CAN I EAT? Seek help from a dietitian for individual calorie needs. Grains Whole grain or whole wheat bread. Brown rice. Whole grain or whole wheat pasta. Quinoa, bulgur, and whole grain cereals. Low-sodium cereals. Corn or whole wheat flour tortillas. Whole grain cornbread. Whole grain crackers. Low-sodium crackers. Vegetables Fresh or frozen vegetables (raw, steamed, roasted, or grilled). Low-sodium or reduced-sodium tomato and vegetable juices. Low-sodium or reduced-sodium tomato sauce and paste. Low-sodium or  reduced-sodium canned vegetables.  Fruits All fresh, canned (in natural juice), or frozen fruits. Meat and Other Protein Products Ground beef (85% or leaner), grass-fed beef, or beef trimmed of fat. Skinless chicken or [place]. Ground chicken or [place]. Pork trimmed of fat. All fish and seafood. Eggs. Dried beans, peas, or lentils. Unsalted nuts and seeds. Unsalted canned beans. Dairy Low-fat dairy products, such as skim or 1% milk, 2% or reduced-fat cheeses, low-fat ricotta or cottage cheese, or plain low-fat yogurt. Low-sodium or reduced-sodium cheeses. Fats and Oils Tub margarines without trans fats. Light or reduced-fat mayonnaise and salad dressings (reduced sodium). Avocado. Safflower, olive, or canola oils. Natural peanut or almond butter. Other Unsalted popcorn and pretzels. The items listed above may not be a complete list of recommended foods or beverages. Contact your dietitian for more options. WHAT FOODS ARE NOT RECOMMENDED? Grains White bread. White pasta. White rice. Refined cornbread. Bagels and croissants. Crackers that contain trans fat. Vegetables Creamed or fried vegetables. Vegetables in a cheese sauce. Regular canned vegetables. Regular canned tomato sauce and paste. Regular tomato and vegetable juices. Fruits Dried fruits. Canned fruit in light or heavy syrup. Fruit juice. Meat and Other Protein Products Fatty cuts of meat. Ribs, chicken wings, bacon, sausage, bologna, salami, chitterlings, fatback, hot dogs, bratwurst, and packaged luncheon meats. Salted nuts and seeds. Canned beans with salt. Dairy Whole or 2% milk, cream, half-and-half, and cream cheese. Whole-fat or sweetened yogurt. Full-fat cheeses or blue cheese. Nondairy creamers and whipped toppings. Processed cheese, cheese spreads, or cheese curds. Condiments Onion and garlic salt, seasoned salt, table salt, and sea salt. Canned and packaged gravies. Worcestershire sauce. Tartar sauce. Barbecue sauce. Teriyaki  sauce. Soy sauce, including reduced sodium. Steak sauce. Fish sauce. Oyster sauce. Cocktail sauce. Horseradish. Ketchup and mustard. Meat flavorings and tenderizers. Bouillon cubes. Hot sauce. Tabasco sauce. Marinades. Taco seasonings. Relishes. Fats and Oils Butter, stick margarine, lard, shortening, ghee, and bacon fat. Coconut, palm kernel, or palm oils. Regular salad dressings. Other Pickles and olives. Salted popcorn and pretzels. The items listed above may not be a complete list of foods and beverages to avoid. Contact your dietitian for more information. WHERE CAN I FIND MORE INFORMATION? National Heart, Lung, and Blood Institute: travelstabloid.com Document Released: 05/26/2011 Document Revised: 06/11/2013 Document Reviewed: 04/10/2013 [facility] Patient Information 2015 [place], [place]. This information is not intended to replace advice given to you by your health care provider. Make sure you discuss any questions you have with your health care  provider. Diabetes Mellitus and Food It is important for you to manage your blood sugar (glucose) level. Your blood glucose level can be greatly affected by what you eat. Eating healthier foods in the appropriate amounts throughout the day at about the same time each day will help you control your blood glucose level. It can also help slow or prevent worsening of your diabetes mellitus. Healthy eating may even help you improve the level of your blood pressure and reach or maintain a healthy weight.  HOW CAN FOOD AFFECT ME? Carbohydrates Carbohydrates affect your blood glucose level more than any other type of food. Your dietitian will help you determine how many carbohydrates to eat at each meal and teach you how to count carbohydrates. Counting carbohydrates is important to keep your blood glucose at a healthy level, especially if you are using insulin or taking certain medicines for diabetes mellitus. Alcohol Alcohol  can cause sudden decreases in blood glucose (hypoglycemia), especially if you use insulin or take certain medicines for diabetes mellitus. Hypoglycemia can be a life-threatening condition. Symptoms of hypoglycemia (sleepiness, dizziness, and disorientation) are similar to symptoms of having too much alcohol.  If your health care provider has given you approval to drink alcohol, do so in moderation and use the following guidelines:  Women should not have more than one drink per day, and men should not have more than two drinks per day. One drink is equal to:  12 oz of beer.  5 oz of wine.  1 oz of hard liquor.  Do not drink on an empty stomach.  Keep yourself hydrated. Have water, diet soda, or unsweetened iced tea.  Regular soda, juice, and other mixers might contain a lot of carbohydrates and should be counted. WHAT FOODS ARE NOT RECOMMENDED? As you make food choices, it is important to remember that all foods are not the same. Some foods have fewer nutrients per serving than other foods, even though they might have the same number of calories or carbohydrates. It is difficult to get your body what it needs when you eat foods with fewer nutrients. Examples of foods that you should avoid that are high in calories and carbohydrates but low in nutrients include:  Trans fats (most processed foods list trans fats on the Nutrition Facts label).  Regular soda.  Juice.  Candy.  Sweets, such as cake, pie, doughnuts, and cookies.  Fried foods. WHAT FOODS CAN I EAT? Have nutrient-rich foods, which will nourish your body and keep you healthy. The food you should eat also will depend on several factors, including:  The calories you need.  The medicines you take.  Your weight.  Your blood glucose level.  Your blood pressure level.  Your cholesterol level. You also should eat a variety of foods, including:  Protein, such as meat, poultry, fish, tofu, nuts, and seeds (lean animal  proteins are best).  Fruits.  Vegetables.  Dairy products, such as milk, cheese, and yogurt (low fat is best).  Breads, grains, pasta, cereal, rice, and beans.  Fats such as olive oil, trans fat-free margarine, canola oil, avocado, and olives. DOES EVERYONE WITH DIABETES MELLITUS HAVE THE SAME MEAL PLAN? Because every person with diabetes mellitus is different, there is not one meal plan that works for everyone. It is very important that you meet with a dietitian who will help you create a meal plan that is just right for you. Document Released: 03/03/2005 Document Revised: 06/11/2013 Document Reviewed: 05/03/2013 ExitCare Patient Information 2015 [place]. This information is not intended to replace advice given to you by your health care provider. Make sure you discuss any questions you have with your health care provider.  

## 2014-01-20 ENCOUNTER — Ambulatory Visit (INDEPENDENT_AMBULATORY_CARE_PROVIDER_SITE_OTHER): Payer: No Typology Code available for payment source | Admitting: Sports Medicine

## 2014-01-20 ENCOUNTER — Ambulatory Visit
Admission: RE | Admit: 2014-01-20 | Discharge: 2014-01-20 | Disposition: A | Discharge status: Home / Self Care | Payer: No Typology Code available for payment source | Source: Ambulatory Visit | Attending: Sports Medicine | Admitting: Sports Medicine

## 2014-01-20 ENCOUNTER — Telehealth: Payer: Self-pay | Admitting: Sports Medicine

## 2014-01-20 ENCOUNTER — Encounter: Payer: Self-pay | Admitting: Sports Medicine

## 2014-01-20 VITALS — BP 132/83 | Ht 66.0 in | Wt 249.0 lb

## 2014-01-20 DIAGNOSIS — M545 Low back pain: Secondary | ICD-10-CM

## 2014-01-20 DIAGNOSIS — M5430 Sciatica, unspecified side: Secondary | ICD-10-CM

## 2014-01-20 DIAGNOSIS — M5442 Lumbago with sciatica, left side: Secondary | ICD-10-CM

## 2014-01-20 NOTE — Telephone Encounter (Signed)
Review of her lumbar spine x-ray shows moderately advanced degenerative disc disease at L5-S1. I will await MRI results to see if she has any foraminal stenosis which may benefit from a diagnostic lumbar ESI. I will call the patient after I reviewed that study.

## 2014-01-20 NOTE — Progress Notes (Signed)
   Subjective:    Patient ID: Briana Alvarez, female    DOB: 07/31/1960, 53 y.o.   MRN: [id number]  HPI chief complaint: Low back pain  53 year old female comes in today complaining of several years of low back pain. She describes a burning sensation diffuse across her low-back with radiating discomfort into the left leg. She also gets some tingling in her left foot but admits that she also gets this same feeling in her right foot and isn't sure whether or not that is do to her neuropathy or from her low back. She denies any weakness. She denies any groin pain. No previous low back surgery. She is on both naproxen and gabapentin but continues to have pain. Pain is present with sitting, standing, and walking. No change in bowel or bladder habits.  Past medical history and her medications reviewed Surgical history is significant for a recent cervical spine fusion done at [facility]. This was done in 2013. Allergies reviewed    Review of Systems     Objective:   Physical Exam Well-developed, overweight. No acute distress  Lumbar spine: Limited range of motion in both forward flexion and extension secondary to pain. No spasm. Diffuse tenderness along the lumbosacral area to palpation but nothing focal. Negative straight leg raise bilaterally. Negative log roll bilaterally. Strength is 5/5 both lower extremities. No atrophy. Slight decreased sensation along the L4 dermatome on the left compared to the right.  An MRI scan from [facility] done in June of 2013 is reviewed. Report is available for review but the images are not. Per the report, patient has severe lumbar degenerative disc disease at L5-S1. Mild spinal stenosis at L3-L4 and mild lateral recess stenosis at L4-L5 and L5-S1.       Assessment & Plan:  Chronic low back pain secondary to lumbar degenerative disc disease  I'm going to get an updated x-ray and MRI of her lumbar spine. My thought here is to see if she is a candidate for a  diagnostic/therapeutic lumbar ESI. I will call her with those results once available. Of note, she still has charity care at [facility] until January so if she fails initial conservative treatment I may refer her to them for their input.

## 2014-01-24 ENCOUNTER — Ambulatory Visit
Admission: RE | Admit: 2014-01-24 | Discharge: 2014-01-24 | Disposition: A | Discharge status: Home / Self Care | Payer: No Typology Code available for payment source | Source: Ambulatory Visit | Attending: Sports Medicine | Admitting: Sports Medicine

## 2014-01-24 DIAGNOSIS — M5442 Lumbago with sciatica, left side: Secondary | ICD-10-CM

## 2014-01-29 ENCOUNTER — Telehealth: Payer: Self-pay | Admitting: Sports Medicine

## 2014-01-29 NOTE — Addendum Note (Signed)
Addended by: Cyd Silence on: 01/29/2014 10:03 AM   Modules accepted: Orders

## 2014-01-29 NOTE — Telephone Encounter (Signed)
I spoke with the patient on the phone today after reviewing the MRI of her lumbar spine. She has multilevel degenerative lumbar spondylosis with multilevel disc disease and facet disease. I've recommended a referral to interventional radiology for epidural versus facet injections. I've asked the patient to call me after her initial injection for a check on her progress. As noted previously, she has charity care at [place] at [facility] and if she failed initial conservative treatment I may refer her back over to their campus. She has charity care until the end of this year.

## 2014-01-29 NOTE — Telephone Encounter (Signed)
Message copied by Thurman Coyer on Wed Jan 29, 2014  9:34 AM ------      Message from: CERESI, Threasa Beards L      Created: Wed Jan 29, 2014  9:30 AM      Regarding: FW: mri resutls      Contact: [phone]       Pt called to get her mri results.      ----- Message -----         From: [name]         Sent: 01/28/2014   3:54 PM           To: Laurey Arrow, RN      Subject: mri resutls                                              Pt called to get her Lower back mri results       ------

## 2014-01-30 ENCOUNTER — Other Ambulatory Visit: Payer: Self-pay | Admitting: Sports Medicine

## 2014-01-30 DIAGNOSIS — M5442 Lumbago with sciatica, left side: Secondary | ICD-10-CM

## 2014-02-03 ENCOUNTER — Ambulatory Visit
Admission: RE | Admit: 2014-02-03 | Discharge: 2014-02-03 | Disposition: A | Discharge status: Home / Self Care | Payer: No Typology Code available for payment source | Source: Ambulatory Visit | Attending: Sports Medicine | Admitting: Sports Medicine

## 2014-02-03 VITALS — BP 203/119 | HR 51

## 2014-02-03 DIAGNOSIS — M5442 Lumbago with sciatica, left side: Secondary | ICD-10-CM

## 2014-02-03 MED ORDER — METHYLPREDNISOLONE ACETATE 40 MG/ML INJ SUSP (RADIOLOG
120.0000 mg | Freq: Once | INTRAMUSCULAR | Status: AC
Start: 2014-02-03 — End: 2014-02-03
  Administered 2014-02-03: 120 mg via EPIDURAL

## 2014-02-03 MED ORDER — IOHEXOL 180 MG/ML  SOLN
1.0000 mL | Freq: Once | INTRAMUSCULAR | Status: AC | PRN
  Administered 2014-02-03: 1 mL via EPIDURAL

## 2014-02-03 NOTE — Discharge Instructions (Signed)

## 2014-02-05 ENCOUNTER — Other Ambulatory Visit: Payer: Self-pay | Admitting: Internal Medicine

## 2014-02-05 DIAGNOSIS — E1159 Type 2 diabetes mellitus with other circulatory complications: Secondary | ICD-10-CM

## 2014-02-07 ENCOUNTER — Other Ambulatory Visit: Payer: Self-pay

## 2014-02-07 DIAGNOSIS — E1159 Type 2 diabetes mellitus with other circulatory complications: Secondary | ICD-10-CM

## 2014-02-07 MED ORDER — GABAPENTIN 300 MG PO CAPS: ORAL_CAPSULE | ORAL | Status: DC

## 2014-02-17 ENCOUNTER — Ambulatory Visit: Payer: No Typology Code available for payment source | Admitting: Sports Medicine

## 2014-02-18 ENCOUNTER — Ambulatory Visit (INDEPENDENT_AMBULATORY_CARE_PROVIDER_SITE_OTHER): Payer: Self-pay | Admitting: Sports Medicine

## 2014-02-18 ENCOUNTER — Encounter: Payer: Self-pay | Admitting: Sports Medicine

## 2014-02-18 VITALS — BP 127/70 | Ht 67.0 in | Wt 245.0 lb

## 2014-02-18 DIAGNOSIS — M5416 Radiculopathy, lumbar region: Secondary | ICD-10-CM

## 2014-02-18 DIAGNOSIS — IMO0002 Reserved for concepts with insufficient information to code with codable children: Secondary | ICD-10-CM

## 2014-02-18 MED ORDER — TRAMADOL HCL 50 MG PO TABS: 50.0000 mg | ORAL_TABLET | Freq: Four times a day (QID) | ORAL | Status: DC | PRN

## 2014-02-18 NOTE — Patient Instructions (Signed)
DR [facility] PHONE [phone] FAX [phone]

## 2014-02-18 NOTE — Progress Notes (Signed)
Subjective:    Patient ID: Briana Alvarez, female    DOB: 13-Jul-1960, 53 y.o.   MRN: [id number]  HPI Briana Alvarez is a 53 year old female who presents for followup of chronic low back pain with radicular symptoms. She recently had an epidural steroid injection with radiology on 02/03/14, which he says significantly helps for approximately 2 days, but her symptoms quickly returned to their previous level. Her pain is located across the bilateral lower back with radiation around the left lateral hip and down the lateral leg to the level of her left lateral foot. The pain is described as a sharp, burning, and stabbing pain. She says her symptoms are actually relieved if she is sitting up and worse if she is trying to lay flat. Severity of symptoms is 10 out of 10 at times, though always greater than an 8/10. She denies any significant weakness in the lower extremity, but does note occasional numbness or tingling. She denies any associated fevers, chills, unexplained weight loss, loss of bladder or bowel, or saddle anesthesia. She has tried to remain active through water exercise, which he says helps, however when she gets out of the pool she notes immediate worsening pain of the low back with radiation down the left lateral leg. Symptoms are aggravated with activity, though do occur at rest. She is not taking any medications at this time for her symptoms.  Past Medical History  Diagnosis Date  . Diabetes mellitus   . Hypertension   . Depression   . Anxiety   . Headache    History   Social History  . Marital Status: Divorced    Spouse Name: N/A    Number of Children: N/A  . Years of Education: N/A   Occupational History  . Not on file.   Social History Main Topics  . Smoking status: Never Smoker   . Smokeless tobacco: Not on file  . Alcohol Use: No  . Drug Use: No  . Sexual Activity: Not on file   Other Topics Concern  . Not on file   Social History Narrative  . No narrative on file     Review of Systems As per history of present illness, Levan point review of systems was performed is otherwise negative.    Objective:   Physical Exam BP 127/70  Ht 5\' 7"  (1.702 m)  Wt 245 lb (111.131 kg)  BMI 38.36 kg/m2 GEN: The patient is well-developed well-nourished female and in no acute distress.  She is awake alert and oriented x3. SKIN: warm and well-perfused, no rash  EXTR: No lower extremity edema or calf tenderness Neuro: Strength 5/5 globally. Sensation intact throughout. See reflexes below. Vasc: +2 bilateral distal pulses. No edema.  Back Exam: Gait: moderately antalgic  Inspection/Deformity: No abnormality  Paraspinus T:  Bilateral lower lumbar paraspinal tenderness. Also pain at the left SI joint.  B Ankle Dorsiflexion (L5,4): 5/5  B Great Toe Dorsiflexion (L5,4): 5/5  Heel Walk (L5): WNL  Toe Walk (S1): WNL  Rise/Squat (L4): WNL   SENSORY  B Medial Foot (L4): WNL  B Dorsum (L5): WNL  B Lateral (S1): WNL  Light Touch: WNL  Pinprick: WNL   REFLEXES  Knee (L4): 2+  Ankle (S1): 2+  B SLR, seated: positive B SLR, supine: positive B FABER: neg  B Reverse FABER: neg  B Greater Troch: NT  B Log Roll: neg  B Stork: NT  B Sciatic Notch: NT  MRI Lumbar Spine: 01/20/13: Advanced degenerative lumbar  spondylosis with multilevel disc  disease and facet disease. Stable multilevel spinal, lateral recess  and foraminal stenosis as discussed above. No significant change  when compared to prior examination.  Epidural Steroid Injection Lumbar Spine: 02/03/14    Assessment & Plan:  1. Chronic low back pain secondary to severe lumbar degenerative disc disease at L5-S1, also with L4-L5 radiculopathy, mild spinal stenosis at L3-L4 and L4-L5, and mild lateral recess stenosis at L5-S1. -Given the patient's poor response to the epidural steroid injection, the patient would consider either facet joint injections or more aggressive surgical correction if indicated. -She  has [facility] care through [facility] through the end of this year, and she has established care with a spine surgeon there. -She would like to return to [facility] to see her spine surgeon to see what could potentially be done for her persistent chronic low back pain with radicular symptoms -Rx for Tramadol 50mg  with precautions with SSRI use faxed to patient's pharmacy today. -Follow-up as needed and pending Ortho Spine intervention plan.

## 2014-02-18 NOTE — Assessment & Plan Note (Signed)
1. Chronic low back pain secondary to severe lumbar degenerative disc disease at L5-S1, also with L4-L5 radiculopathy, mild spinal stenosis at L3-L4 and L4-L5, and mild lateral recess stenosis at L5-S1. -Given the patient's poor response to the epidural steroid injection, the patient would consider either facet joint injections or more aggressive surgical correction if indicated. -She has [facility] care through [facility] through the end of this year, and she has established care with a spine surgeon there. -She would like to return to [facility] to see her spine surgeon to see what could potentially be done for her persistent chronic low back pain with radicular symptoms -Rx for Tramadol 50mg  with precautions with SSRI use faxed to patient's pharmacy today. -Follow-up as needed and pending Ortho Spine intervention plan.

## 2014-03-04 ENCOUNTER — Ambulatory Visit (HOSPITAL_COMMUNITY): Payer: No Typology Code available for payment source

## 2014-03-05 ENCOUNTER — Ambulatory Visit (HOSPITAL_COMMUNITY)
Admission: RE | Admit: 2014-03-05 | Discharge: 2014-03-05 | Disposition: A | Discharge status: Home / Self Care | Payer: No Typology Code available for payment source | Source: Ambulatory Visit | Attending: Internal Medicine | Admitting: Internal Medicine

## 2014-03-05 DIAGNOSIS — Z139 Encounter for screening, unspecified: Secondary | ICD-10-CM

## 2014-03-06 ENCOUNTER — Emergency Department (HOSPITAL_COMMUNITY): Payer: Self-pay

## 2014-03-06 ENCOUNTER — Emergency Department (HOSPITAL_COMMUNITY): Payer: No Typology Code available for payment source

## 2014-03-06 ENCOUNTER — Emergency Department (HOSPITAL_COMMUNITY)
Admission: EM | Admit: 2014-03-06 | Discharge: 2014-03-06 | Disposition: A | Discharge status: Home / Self Care | Payer: Self-pay | Attending: Emergency Medicine | Admitting: Emergency Medicine

## 2014-03-06 ENCOUNTER — Encounter (HOSPITAL_COMMUNITY): Payer: Self-pay | Admitting: Emergency Medicine

## 2014-03-06 DIAGNOSIS — F411 Generalized anxiety disorder: Secondary | ICD-10-CM | POA: Insufficient documentation

## 2014-03-06 DIAGNOSIS — S298XXA Other specified injuries of thorax, initial encounter: Secondary | ICD-10-CM | POA: Insufficient documentation

## 2014-03-06 DIAGNOSIS — Y9389 Activity, other specified: Secondary | ICD-10-CM | POA: Insufficient documentation

## 2014-03-06 DIAGNOSIS — S199XXA Unspecified injury of neck, initial encounter: Principal | ICD-10-CM

## 2014-03-06 DIAGNOSIS — R079 Chest pain, unspecified: Secondary | ICD-10-CM

## 2014-03-06 DIAGNOSIS — F3289 Other specified depressive episodes: Secondary | ICD-10-CM | POA: Insufficient documentation

## 2014-03-06 DIAGNOSIS — S0993XA Unspecified injury of face, initial encounter: Secondary | ICD-10-CM | POA: Insufficient documentation

## 2014-03-06 DIAGNOSIS — M542 Cervicalgia: Secondary | ICD-10-CM

## 2014-03-06 DIAGNOSIS — F329 Major depressive disorder, single episode, unspecified: Secondary | ICD-10-CM | POA: Insufficient documentation

## 2014-03-06 DIAGNOSIS — IMO0002 Reserved for concepts with insufficient information to code with codable children: Secondary | ICD-10-CM | POA: Insufficient documentation

## 2014-03-06 DIAGNOSIS — Y92410 Unspecified street and highway as the place of occurrence of the external cause: Secondary | ICD-10-CM | POA: Insufficient documentation

## 2014-03-06 DIAGNOSIS — I10 Essential (primary) hypertension: Secondary | ICD-10-CM | POA: Insufficient documentation

## 2014-03-06 DIAGNOSIS — E119 Type 2 diabetes mellitus without complications: Secondary | ICD-10-CM | POA: Insufficient documentation

## 2014-03-06 DIAGNOSIS — Z79899 Other long term (current) drug therapy: Secondary | ICD-10-CM | POA: Insufficient documentation

## 2014-03-06 MED ORDER — ONDANSETRON 4 MG PO TBDP
8.0000 mg | ORAL_TABLET | Freq: Once | ORAL | Status: AC
  Administered 2014-03-06: 8 mg via ORAL
  Filled 2014-03-06: qty 2

## 2014-03-06 MED ORDER — ONDANSETRON HCL 4 MG PO TABS: 4.0000 mg | ORAL_TABLET | Freq: Four times a day (QID) | ORAL | Status: DC

## 2014-03-06 MED ORDER — OXYCODONE-ACETAMINOPHEN 5-325 MG PO TABS: 1.0000 | ORAL_TABLET | ORAL | Status: DC | PRN

## 2014-03-06 MED ORDER — OXYCODONE-ACETAMINOPHEN 5-325 MG PO TABS
2.0000 | ORAL_TABLET | Freq: Once | ORAL | Status: AC
  Administered 2014-03-06: 2 via ORAL
  Filled 2014-03-06: qty 2
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
## 2014-03-06 NOTE — Discharge Instructions (Signed)
Cervical Sprain °A cervical sprain is an injury in the neck in which the strong, fibrous tissues (ligaments) that connect your neck bones stretch or tear. Cervical sprains can range from mild to severe. Severe cervical sprains can cause the neck vertebrae to be unstable. This can lead to damage of the spinal cord and can result in serious nervous system problems. The amount of time it takes for a cervical sprain to get better depends on the cause and extent of the injury. Most cervical sprains heal in 1 to 3 weeks. °CAUSES  °Severe cervical sprains may be caused by:  °· Contact sport injuries (such as from football, rugby, wrestling, hockey, auto racing, gymnastics, diving, martial arts, or boxing).   °· Motor vehicle collisions.   °· Whiplash injuries. This is an injury from a sudden forward and backward whipping movement of the head and neck.  °· Falls.   °Mild cervical sprains may be caused by:  °· Being in an awkward position, such as while cradling a telephone between your ear and shoulder.   °· Sitting in a chair that does not offer proper support.   °· Working at a poorly designed computer station.   °· Looking up or down for long periods of time.   °SYMPTOMS  °· Pain, soreness, stiffness, or a burning sensation in the front, back, or sides of the neck. This discomfort may develop immediately after the injury or slowly, 24 hours or more after the injury.   °· Pain or tenderness directly in the middle of the back of the neck.   °· Shoulder or upper back pain.   °· Limited ability to move the neck.   °· Headache.   °· Dizziness.   °· Weakness, numbness, or tingling in the hands or arms.   °· Muscle spasms.   °· Difficulty swallowing or chewing.   °· Tenderness and swelling of the neck.   °DIAGNOSIS  °Most of the time your health care provider can diagnose a cervical sprain by taking your history and doing a physical exam. Your health care provider will ask about previous neck injuries and any known neck  problems, such as arthritis in the neck. X-rays may be taken to find out if there are any other problems, such as with the bones of the neck. Other tests, such as a CT scan or MRI, may also be needed.  °TREATMENT  °Treatment depends on the severity of the cervical sprain. Mild sprains can be treated with rest, keeping the neck in place (immobilization), and pain medicines. Severe cervical sprains are immediately immobilized. Further treatment is done to help with pain, muscle spasms, and other symptoms and may include: °· Medicines, such as pain relievers, numbing medicines, or muscle relaxants.   °· Physical therapy. This may involve stretching exercises, strengthening exercises, and posture training. Exercises and improved posture can help stabilize the neck, strengthen muscles, and help stop symptoms from returning.   °HOME CARE INSTRUCTIONS  °· Put ice on the injured area.   °¨ Put ice in a plastic bag.   °¨ Place a towel between your skin and the bag.   °¨ Leave the ice on for 15-20 minutes, 3-4 times a day.   °· If your injury was severe, you may have been given a cervical collar to wear. A cervical collar is a two-piece collar designed to keep your neck from moving while it heals. °¨ Do not remove the collar unless instructed by your health care provider. °¨ If you have long hair, keep it outside of the collar. °¨ Ask your health care provider before making any adjustments to your collar. Minor   adjustments may be required over time to improve comfort and reduce pressure on your chin or on the back of your head. °¨ If you are allowed to remove the collar for cleaning or bathing, follow your health care provider's instructions on how to do so safely. °¨ Keep your collar clean by wiping it with mild soap and water and drying it completely. If the collar you have been given includes removable pads, remove them every 1-2 days and hand wash them with soap and water. Allow them to air dry. They should be completely  dry before you wear them in the collar. °¨ If you are allowed to remove the collar for cleaning and bathing, wash and dry the skin of your neck. Check your skin for irritation or sores. If you see any, tell your health care provider. °¨ Do not drive while wearing the collar.   °· Only take over-the-counter or prescription medicines for pain, discomfort, or fever as directed by your health care provider.   °· Keep all follow-up appointments as directed by your health care provider.   °· Keep all physical therapy appointments as directed by your health care provider.   °· Make any needed adjustments to your workstation to promote good posture.   °· Avoid positions and activities that make your symptoms worse.   °· Warm up and stretch before being active to help prevent problems.   °SEEK MEDICAL CARE IF:  °· Your pain is not controlled with medicine.   °· You are unable to decrease your pain medicine over time as planned.   °· Your activity level is not improving as expected.   °SEEK IMMEDIATE MEDICAL CARE IF:  °· You develop any bleeding. °· You develop stomach upset. °· You have signs of an allergic reaction to your medicine.   °· Your symptoms get worse.   °· You develop new, unexplained symptoms.   °· You have numbness, tingling, weakness, or paralysis in any part of your body.   °MAKE SURE YOU:  °· Understand these instructions. °· Will watch your condition. °· Will get help right away if you are not doing well or get worse. °Document Released: 04/03/2007 Document Revised: 06/11/2013 Document Reviewed: 12/12/2012 °ExitCare® Patient Information ©2015 [employer]. This information is not intended to replace advice given to you by your health care provider. Make sure you discuss any questions you have with your health care provider. ° °Motor Vehicle Collision °It is common to have multiple bruises and sore muscles after a motor vehicle collision (MVC). These tend to feel worse for the first 24 hours. You may have  the most stiffness and soreness over the first several hours. You may also feel worse when you wake up the first morning after your collision. After this point, you will usually begin to improve with each day. The speed of improvement often depends on the severity of the collision, the number of injuries, and the location and nature of these injuries. °HOME CARE INSTRUCTIONS °· Put ice on the injured area. °¨ Put ice in a plastic bag. °¨ Place a towel between your skin and the bag. °¨ Leave the ice on for 15-20 minutes, 3-4 times a day, or as directed by your health care provider. °· Drink enough fluids to keep your urine clear or pale yellow. Do not drink alcohol. °· Take a warm shower or bath once or twice a day. This will increase blood flow to sore muscles. °· You may return to activities as directed by your caregiver. Be careful when lifting, as this may aggravate neck or back   pain. °· Only take over-the-counter or prescription medicines for pain, discomfort, or fever as directed by your caregiver. Do not use aspirin. This may increase bruising and bleeding. °SEEK IMMEDIATE MEDICAL CARE IF: °· You have numbness, tingling, or weakness in the arms or legs. °· You develop severe headaches not relieved with medicine. °· You have severe neck pain, especially tenderness in the middle of the back of your neck. °· You have changes in bowel or bladder control. °· There is increasing pain in any area of the body. °· You have shortness of breath, light-headedness, dizziness, or fainting. °· You have chest pain. °· You feel sick to your stomach (nauseous), throw up (vomit), or sweat. °· You have increasing abdominal discomfort. °· There is blood in your urine, stool, or vomit. °· You have pain in your shoulder (shoulder strap areas). °· You feel your symptoms are getting worse. °MAKE SURE YOU: °· Understand these instructions. °· Will watch your condition. °· Will get help right away if you are not doing well or get  worse. °Document Released: 06/06/2005 Document Revised: 10/21/2013 Document Reviewed: 11/03/2010 °Exit[employer]® Patient Information ©2015 [employer]. This information is not intended to replace advice given to you by your health care provider. Make sure you discuss any questions you have with your health care provider. ° °
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
## 2014-03-07 NOTE — ED Provider Notes (Signed)
Medical screening examination/treatment/procedure(s) were performed by non-physician practitioner and as supervising physician I was immediately available for consultation/collaboration.  Carmin Muskrat, MD 03/07/14 [phone]

## 2014-03-18 ENCOUNTER — Emergency Department (HOSPITAL_COMMUNITY): Payer: No Typology Code available for payment source

## 2014-03-18 ENCOUNTER — Emergency Department (HOSPITAL_COMMUNITY)
Admission: EM | Admit: 2014-03-18 | Discharge: 2014-03-18 | Disposition: A | Discharge status: Home / Self Care | Payer: No Typology Code available for payment source | Attending: Emergency Medicine | Admitting: Emergency Medicine

## 2014-03-18 ENCOUNTER — Encounter (HOSPITAL_COMMUNITY): Payer: Self-pay | Admitting: Emergency Medicine

## 2014-03-18 ENCOUNTER — Ambulatory Visit: Payer: No Typology Code available for payment source | Admitting: Internal Medicine

## 2014-03-18 DIAGNOSIS — F411 Generalized anxiety disorder: Secondary | ICD-10-CM | POA: Insufficient documentation

## 2014-03-18 DIAGNOSIS — F329 Major depressive disorder, single episode, unspecified: Secondary | ICD-10-CM | POA: Insufficient documentation

## 2014-03-18 DIAGNOSIS — R0602 Shortness of breath: Secondary | ICD-10-CM | POA: Insufficient documentation

## 2014-03-18 DIAGNOSIS — F3289 Other specified depressive episodes: Secondary | ICD-10-CM | POA: Insufficient documentation

## 2014-03-18 DIAGNOSIS — R1111 Vomiting without nausea: Secondary | ICD-10-CM

## 2014-03-18 DIAGNOSIS — R079 Chest pain, unspecified: Secondary | ICD-10-CM | POA: Insufficient documentation

## 2014-03-18 DIAGNOSIS — R112 Nausea with vomiting, unspecified: Secondary | ICD-10-CM | POA: Insufficient documentation

## 2014-03-18 DIAGNOSIS — E119 Type 2 diabetes mellitus without complications: Secondary | ICD-10-CM | POA: Insufficient documentation

## 2014-03-18 DIAGNOSIS — R51 Headache: Secondary | ICD-10-CM | POA: Insufficient documentation

## 2014-03-18 DIAGNOSIS — Z79899 Other long term (current) drug therapy: Secondary | ICD-10-CM | POA: Insufficient documentation

## 2014-03-18 DIAGNOSIS — I10 Essential (primary) hypertension: Secondary | ICD-10-CM | POA: Insufficient documentation

## 2014-03-18 LAB — I-STAT TROPONIN, ED
Troponin i, poc: 0 ng/mL (ref 0.00–0.08)
Troponin i, poc: 0 ng/mL (ref 0.00–0.08)

## 2014-03-18 LAB — COMPREHENSIVE METABOLIC PANEL
ALK PHOS: 114 U/L (ref 39–117)
ALT: 21 U/L (ref 0–35)
ANION GAP: 16 — AB (ref 5–15)
AST: 24 U/L (ref 0–37)
Albumin: 3.7 g/dL (ref 3.5–5.2)
BILIRUBIN TOTAL: 0.5 mg/dL (ref 0.3–1.2)
BUN: 18 mg/dL (ref 6–23)
CHLORIDE: 99 meq/L (ref 96–112)
CO2: 22 meq/L (ref 19–32)
Calcium: 9.1 mg/dL (ref 8.4–10.5)
Creatinine, Ser: 0.95 mg/dL (ref 0.50–1.10)
GFR, EST AFRICAN AMERICAN: 78 mL/min — AB (ref >90)
GFR, EST NON AFRICAN AMERICAN: 67 mL/min — AB (ref >90)
GLUCOSE: 219 mg/dL — AB (ref 70–99)
POTASSIUM: 4.4 meq/L (ref 3.7–5.3)
Sodium: 137 mEq/L (ref 137–147)
Total Protein: 6.5 g/dL (ref 6.0–8.3)

## 2014-03-18 LAB — CBC
HEMATOCRIT: 41.5 % (ref 36.0–46.0)
Hemoglobin: 14.2 g/dL (ref 12.0–15.0)
MCH: 32.8 pg (ref 26.0–34.0)
MCHC: 34.2 g/dL (ref 30.0–36.0)
MCV: 95.8 fL (ref 78.0–100.0)
Platelets: 130 10*3/uL — ABNORMAL LOW (ref 150–400)
RBC: 4.33 MIL/uL (ref 3.87–5.11)
RDW: 14.6 % (ref 11.5–15.5)
WBC: 6.5 10*3/uL (ref 4.0–10.5)

## 2014-03-18 LAB — LIPASE, BLOOD: Lipase: 45 U/L (ref 11–59)

## 2014-03-18 MED ORDER — GI COCKTAIL ~~LOC~~
30.0000 mL | Freq: Once | ORAL | Status: AC
  Administered 2014-03-18: 30 mL via ORAL
  Filled 2014-03-18: qty 30

## 2014-03-18 MED ORDER — ACETAMINOPHEN 325 MG PO TABS
325.0000 mg | ORAL_TABLET | Freq: Once | ORAL | Status: AC
Start: 2014-03-18 — End: 2014-03-18
  Administered 2014-03-18: 325 mg via ORAL
  Filled 2014-03-18: qty 1

## 2014-03-18 MED ORDER — ONDANSETRON HCL 4 MG/2ML IJ SOLN
4.0000 mg | Freq: Once | INTRAMUSCULAR | Status: AC
  Administered 2014-03-18: 4 mg via INTRAVENOUS
  Filled 2014-03-18: qty 2

## 2014-03-18 MED ORDER — OMEPRAZOLE 20 MG PO CPDR: 20.0000 mg | DELAYED_RELEASE_CAPSULE | Freq: Every day | ORAL | Status: DC

## 2014-03-18 NOTE — ED Notes (Signed)
Pt. Drinking and eating with no problem. Denies nausea.

## 2014-03-18 NOTE — ED Provider Notes (Signed)
CSN: [id number]     Arrival date & time 03/18/14  1028 History   First MD Initiated Contact with Patient 03/18/14 1109     Chief Complaint  Patient presents with  . Chest Pain     (Consider location/radiation/quality/duration/timing/severity/associated sxs/prior Treatment) HPI  This is a 53 year old female with history of diabetes, hypertension, depression, and hyperlipidemia who presents with chest pain. Patient reports that she had onset of chest pain this morning after vomiting. She had one episode of nonbilious, nonbloody emesis at approximately 8 AM. Following that episode she had some chest and epigastric "tightness." She denies chest pain at this time. She reports intermittent shortness of breath. She denies any fevers or cough. She did not take a full dose aspirin today. She reports baseline lower extremity swelling. She denies any recent hospitalizations. Patient denies any abdominal pain or diarrhea.  Past Medical History  Diagnosis Date  . Diabetes mellitus   . Hypertension   . Depression   . Anxiety   . Headache    Past Surgical History  Procedure Laterality Date  . Abdominal hysterectomy    . Carpal tunnel release    . Ulner nerve     . Tonsillectomy    . Neck surgery    . Knee surgery Left    Family History  Problem Relation Age of Onset  . Cancer Mother   . Diabetes Daughter   . Diabetes Maternal Grandmother    History  Substance Use Topics  . Smoking status: Never Smoker   . Smokeless tobacco: Not on file  . Alcohol Use: No   OB History   Grav Para Term Preterm Abortions TAB SAB Ect Mult Living                 Review of Systems  Constitutional: Negative for fever.  Respiratory: Positive for chest tightness and shortness of breath. Negative for cough.   Cardiovascular: Negative for chest pain and leg swelling.  Gastrointestinal: Positive for vomiting. Negative for nausea, abdominal pain and diarrhea.  Genitourinary: Negative for dysuria.   Musculoskeletal: Negative for back pain.  Neurological: Negative for headaches.  All other systems reviewed and are negative.     Allergies  Shrimp  Home Medications   Prior to Admission medications   Medication Sig Start Date End Date Taking? Authorizing Provider  ARIPiprazole (ABILIFY) 10 MG tablet Take 10 mg by mouth at bedtime.   Yes Historical Provider, MD  atenolol (TENORMIN) 50 MG tablet Take 50 mg by mouth daily.   Yes Historical Provider, MD  DULoxetine (CYMBALTA) 60 MG capsule Take 1 capsule (60 mg total) by mouth daily. 07/16/13  Yes Tresa Garter, MD  EPINEPHrine (EPIPEN) 0.3 mg/0.3 mL DEVI Inject 0.3 mLs (0.3 mg total) into the muscle once. 06/28/12  Yes Ernst Spell, MD  gabapentin (NEURONTIN) 300 MG capsule Take 900 mg by mouth 3 (three) times daily. TAKE 3 CAPSULES BY MOUTH 3 TIMES DAILY 02/07/14  Yes Lorayne Marek, MD  glipiZIDE (GLUCOTROL) 5 MG tablet Take 5 mg by mouth 2 (two) times daily before a meal.  12/27/13  Yes Deepak Advani, MD  lisinopril (PRINIVIL,ZESTRIL) 30 MG tablet Take 1 tablet (30 mg total) by mouth daily. 07/12/13  Yes Lorayne Marek, MD  metFORMIN (GLUCOPHAGE) 1000 MG tablet Take 1,000 mg by mouth 2 (two) times daily with a meal.   Yes Historical Provider, MD  naproxen (NAPROSYN) 500 MG tablet Take 500 mg by mouth 2 (two) times daily as needed for mild  pain. 01/08/14  Yes Deepak Advani, MD  ondansetron (ZOFRAN) 4 MG tablet Take 1 tablet (4 mg total) by mouth every 6 (six) hours. 03/06/14  Yes Elwyn Lade, PA-C  oxyCODONE-acetaminophen (PERCOCET/ROXICET) 5-325 MG per tablet Take 1 tablet by mouth every 4 (four) hours as needed for severe pain. May take 2 tablets PO q 6 hours for severe pain - Do not take with Tylenol as this tablet already contains tylenol 03/06/14  Yes Elwyn Lade, PA-C  traMADol (ULTRAM) 50 MG tablet Take 50 mg by mouth every 6 (six) hours as needed for moderate pain. 02/18/14  Yes John C Pick-Jacobs, DO  traZODone (DESYREL)  150 MG tablet Take 150 mg by mouth at bedtime.   Yes Historical Provider, MD  omeprazole (PRILOSEC) 20 MG capsule Take 1 capsule (20 mg total) by mouth daily. 03/18/14   Merryl Hacker, MD   BP 159/69  Pulse 77  Temp(Src) 97.9 F (36.6 C) (Oral)  Resp 19  Ht 5\' 6"  (1.676 m)  Wt 250 lb (113.399 kg)  BMI 40.37 kg/m2  SpO2 96% Physical Exam  Nursing note and vitals reviewed. Constitutional: She is oriented to person, place, and time. She appears well-developed and well-nourished. No distress.  Overweight  HENT:  Head: Normocephalic and atraumatic.  Eyes: Pupils are equal, round, and reactive to light.  Cardiovascular: Normal rate, regular rhythm and normal heart sounds.   Pulmonary/Chest: Effort normal and breath sounds normal. No respiratory distress. She has no wheezes. She exhibits tenderness.  Tenderness palpation of the epigastrium and lower chest wall  Abdominal: Soft. Bowel sounds are normal. There is tenderness. There is no rebound and no guarding.  Musculoskeletal:  Trace symmetric bilateral lower extremity edema  Neurological: She is alert and oriented to person, place, and time.  Skin: Skin is warm and dry.  Psychiatric: She has a normal mood and affect.    ED Course  Procedures (including critical care time) Labs Review Labs Reviewed  CBC - Abnormal; Notable for the following:    Platelets 130 (*)    All other components within normal limits  COMPREHENSIVE METABOLIC PANEL - Abnormal; Notable for the following:    Glucose, Bld 219 (*)    GFR calc non Af Amer 67 (*)    GFR calc Af Amer 78 (*)    Anion gap 16 (*)    All other components within normal limits  LIPASE, BLOOD  I-STAT TROPOININ, ED  I-STAT TROPOININ, ED    Imaging Review Dg Abd Acute W/chest  03/18/2014   CLINICAL DATA:  Epigastric pain, nausea and vomiting  EXAM: ACUTE ABDOMEN SERIES (ABDOMEN 2 VIEW & CHEST 1 VIEW)  COMPARISON:  03/06/2014  FINDINGS: Mild cardiac enlargement but normal  vascularity. No focal pneumonia, collapse or consolidation. No edema, effusion or pneumothorax. Trachea midline. Lower cervical fusion hardware noted. No free air.  Scattered air and stool throughout the bowel. Negative for obstruction or ileus. No abnormal calcifications. Minor degenerative changes of the spine.  IMPRESSION: No acute chest or abdominal findings by plain radiography   Electronically Signed   By: Daryll Brod M.D.   On: 03/18/2014 11:59     EKG Interpretation   Date/Time:  Tuesday March 18 2014 10:49:19 EDT Ventricular Rate:  79 PR Interval:  138 QRS Duration: 64 QT Interval:  376 QTC Calculation: 431 R Axis:   -29 Text Interpretation:  Normal sinus rhythm Normal ECG Confirmed by Afsana Liera   MD, Stephane Junkins ([id number]) on 03/18/2014 12:32:16 PM  MDM   Final diagnoses:  Chest pain, unspecified chest pain type  Non-intractable vomiting without nausea, vomiting of unspecified type    Patient presents with chest pain following episode of vomiting. She is nontoxic on exam. Denies pain at this time. She does have risk factors for heart disease including diabetes, hypertension, hyperlipidemia. EKG is normal. Acute abdominal series normal and without free air.  Basic labwork including initial troponin is reassuring as well. Patient was given a GI cocktail. On recheck, patient states that her pain had come back but got better with a GI cocktail. Suspect chest pain secondary to a GI source. However, given patient's risk factors, feel that her troponin is reasonable. Heart score is 3. Patient has been pain-free while she's been in the emergency room. She's had no further episodes of emesis. Repeat troponin is negative. Discuss with patient initiation of a PPI. She will also be given cardiology followup for outpatient stress testing. Patient was given a strict return precautions.  After history, exam, and medical workup I feel the patient has been appropriately medically screened and is  safe for discharge home. Pertinent diagnoses were discussed with the patient. Patient was given return precautions.     Merryl Hacker, MD 03/18/14 [phone]

## 2014-03-18 NOTE — Discharge Instructions (Signed)

## 2014-03-18 NOTE — ED Notes (Signed)
Pt. Reports HA, Dr. Dina Rich informed

## 2014-03-18 NOTE — ED Notes (Signed)
Patient states chest pain started this morning at 0830a.   Patient states it is intermittent.   Nothing makes better or worse.   Patient states N/V and SOB.

## 2014-03-20 ENCOUNTER — Other Ambulatory Visit: Payer: Self-pay | Admitting: Internal Medicine

## 2014-03-25 ENCOUNTER — Other Ambulatory Visit: Payer: Self-pay

## 2014-03-25 MED ORDER — GABAPENTIN 300 MG PO CAPS: 900.0000 mg | ORAL_CAPSULE | Freq: Three times a day (TID) | ORAL | Status: DC

## 2014-04-14 ENCOUNTER — Ambulatory Visit: Payer: No Typology Code available for payment source | Attending: Internal Medicine | Admitting: Internal Medicine

## 2014-04-14 ENCOUNTER — Encounter: Payer: Self-pay | Admitting: Internal Medicine

## 2014-04-14 VITALS — BP 147/91 | HR 60 | Temp 98.0°F | Resp 16 | Wt 252.8 lb

## 2014-04-14 DIAGNOSIS — Z23 Encounter for immunization: Secondary | ICD-10-CM

## 2014-04-14 DIAGNOSIS — I10 Essential (primary) hypertension: Secondary | ICD-10-CM

## 2014-04-14 DIAGNOSIS — M542 Cervicalgia: Secondary | ICD-10-CM

## 2014-04-14 DIAGNOSIS — E781 Pure hyperglyceridemia: Secondary | ICD-10-CM | POA: Insufficient documentation

## 2014-04-14 DIAGNOSIS — E785 Hyperlipidemia, unspecified: Secondary | ICD-10-CM | POA: Insufficient documentation

## 2014-04-14 DIAGNOSIS — E119 Type 2 diabetes mellitus without complications: Secondary | ICD-10-CM | POA: Insufficient documentation
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
## 2014-04-14 NOTE — Patient Instructions (Signed)
DASH Eating Plan °DASH stands for "Dietary Approaches to Stop Hypertension." The DASH eating plan is a healthy eating plan that has been shown to reduce high blood pressure (hypertension). Additional health benefits may include reducing the risk of type 2 diabetes mellitus, heart disease, and stroke. The DASH eating plan may also help with weight loss. °WHAT DO I NEED TO KNOW ABOUT THE DASH EATING PLAN? °For the DASH eating plan, you will follow these general guidelines: °· Choose foods with a percent daily value for sodium of less than 5% (as listed on the food label). °· Use salt-free seasonings or herbs instead of table salt or sea salt. °· Check with your health care provider or pharmacist before using salt substitutes. °· Eat lower-sodium products, often labeled as "lower sodium" or "no salt added." °· Eat fresh foods. °· Eat more vegetables, fruits, and low-fat dairy products. °· Choose whole grains. Look for the word "whole" as the first word in the ingredient list. °· Choose fish and skinless chicken or turkey more often than red meat. Limit fish, poultry, and meat to 6 oz (170 g) each day. °· Limit sweets, desserts, sugars, and sugary drinks. °· Choose heart-healthy fats. °· Limit cheese to 1 oz (28 g) per day. °· Eat more home-cooked food and less restaurant, buffet, and fast food. °· Limit fried foods. °· Cook foods using methods other than frying. °· Limit canned vegetables. If you do use them, rinse them well to decrease the sodium. °· When eating at a restaurant, ask that your food be prepared with less salt, or no salt if possible. °WHAT FOODS CAN I EAT? °Seek help from a dietitian for individual calorie needs. °Grains °Whole grain or whole wheat bread. Brown rice. Whole grain or whole wheat pasta. Quinoa, bulgur, and whole grain cereals. Low-sodium cereals. Corn or whole wheat flour tortillas. Whole grain cornbread. Whole grain crackers. Low-sodium crackers. °Vegetables °Fresh or frozen vegetables  (raw, steamed, roasted, or grilled). Low-sodium or reduced-sodium tomato and vegetable juices. Low-sodium or reduced-sodium tomato sauce and paste. Low-sodium or reduced-sodium canned vegetables.  °Fruits °All fresh, canned (in natural juice), or frozen fruits. °Meat and Other Protein Products °Ground beef (85% or leaner), grass-fed beef, or beef trimmed of fat. Skinless chicken or turkey. Ground chicken or turkey. Pork trimmed of fat. All fish and seafood. Eggs. Dried beans, peas, or lentils. Unsalted nuts and seeds. Unsalted canned beans. °Dairy °Low-fat dairy products, such as skim or 1% milk, 2% or reduced-fat cheeses, low-fat ricotta or cottage cheese, or plain low-fat yogurt. Low-sodium or reduced-sodium cheeses. °Fats and Oils °Tub margarines without trans fats. Light or reduced-fat mayonnaise and salad dressings (reduced sodium). Avocado. Safflower, olive, or canola oils. Natural peanut or almond butter. °Other °Unsalted popcorn and pretzels. °The items listed above may not be a complete list of recommended foods or beverages. Contact your dietitian for more options. °WHAT FOODS ARE NOT RECOMMENDED? °Grains °White bread. White pasta. White rice. Refined cornbread. Bagels and croissants. Crackers that contain trans fat. °Vegetables °Creamed or fried vegetables. Vegetables in a cheese sauce. Regular canned vegetables. Regular canned tomato sauce and paste. Regular tomato and vegetable juices. °Fruits °Dried fruits. Canned fruit in light or heavy syrup. Fruit juice. °Meat and Other Protein Products °Fatty cuts of meat. Ribs, chicken wings, bacon, sausage, bologna, salami, chitterlings, fatback, hot dogs, bratwurst, and packaged luncheon meats. Salted nuts and seeds. Canned beans with salt. °Dairy °Whole or 2% milk, cream, half-and-half, and cream cheese. Whole-fat or sweetened yogurt. Full-fat   cheeses or blue cheese. Nondairy creamers and whipped toppings. Processed cheese, cheese spreads, or cheese  curds. °Condiments °Onion and garlic salt, seasoned salt, table salt, and sea salt. Canned and packaged gravies. Worcestershire sauce. Tartar sauce. Barbecue sauce. Teriyaki sauce. Soy sauce, including reduced sodium. Steak sauce. Fish sauce. Oyster sauce. Cocktail sauce. Horseradish. Ketchup and mustard. Meat flavorings and tenderizers. Bouillon cubes. Hot sauce. Tabasco sauce. Marinades. Taco seasonings. Relishes. °Fats and Oils °Butter, stick margarine, lard, shortening, ghee, and bacon fat. Coconut, palm kernel, or palm oils. Regular salad dressings. °Other °Pickles and olives. Salted popcorn and pretzels. °The items listed above may not be a complete list of foods and beverages to avoid. Contact your dietitian for more information. °WHERE CAN I FIND MORE INFORMATION? °[employer], [employer], [employer]: www.nhlbi.nih.gov/health/health-topics/topics/dash/ °Document Released: 05/26/2011 Document Revised: 10/21/2013 Document Reviewed: 04/10/2013 °[employer]® Patient Information ©2015 [employer]. This information is not intended to replace advice given to you by your health care provider. Make sure you discuss any questions you have with your health care provider. ° °
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
## 2014-04-15 LAB — COMPLETE METABOLIC PANEL WITH GFR
ALT: 14 U/L (ref 0–35)
AST: 17 U/L (ref 0–37)
Albumin: 4 g/dL (ref 3.5–5.2)
Alkaline Phosphatase: 88 U/L (ref 39–117)
BILIRUBIN TOTAL: 0.6 mg/dL (ref 0.2–1.2)
BUN: 18 mg/dL (ref 6–23)
CO2: 30 meq/L (ref 19–32)
CREATININE: 1.06 mg/dL (ref 0.50–1.10)
Calcium: 8.9 mg/dL (ref 8.4–10.5)
Chloride: 102 mEq/L (ref 96–112)
GFR, EST AFRICAN AMERICAN: 69 mL/min
GFR, EST NON AFRICAN AMERICAN: 60 mL/min
Glucose, Bld: 169 mg/dL — ABNORMAL HIGH (ref 70–99)
Potassium: 4.4 mEq/L (ref 3.5–5.3)
Sodium: 139 mEq/L (ref 135–145)
Total Protein: 5.9 g/dL — ABNORMAL LOW (ref 6.0–8.3)

## 2014-04-16 ENCOUNTER — Ambulatory Visit: Payer: Self-pay | Attending: Internal Medicine

## 2014-04-16 DIAGNOSIS — E781 Pure hyperglyceridemia: Secondary | ICD-10-CM

## 2014-04-16 LAB — LIPID PANEL
CHOL/HDL RATIO: 4.2 ratio
CHOLESTEROL: 130 mg/dL (ref 0–200)
HDL: 31 mg/dL — ABNORMAL LOW (ref >39)
LDL CALC: 58 mg/dL (ref 0–99)
Triglycerides: 206 mg/dL — ABNORMAL HIGH (ref <150)
VLDL: 41 mg/dL — ABNORMAL HIGH (ref 0–40)

## 2014-04-17 ENCOUNTER — Encounter: Payer: Self-pay | Admitting: Interventional Cardiology

## 2014-04-17 ENCOUNTER — Ambulatory Visit (INDEPENDENT_AMBULATORY_CARE_PROVIDER_SITE_OTHER): Payer: No Typology Code available for payment source | Admitting: Interventional Cardiology

## 2014-04-17 VITALS — BP 120/82 | HR 71 | Ht 66.0 in | Wt 251.0 lb

## 2014-04-17 DIAGNOSIS — E119 Type 2 diabetes mellitus without complications: Secondary | ICD-10-CM

## 2014-04-17 DIAGNOSIS — E781 Pure hyperglyceridemia: Secondary | ICD-10-CM

## 2014-04-17 DIAGNOSIS — R079 Chest pain, unspecified: Secondary | ICD-10-CM

## 2014-04-17 NOTE — Patient Instructions (Signed)
Your physician recommends that you continue on your current medications as directed. Please refer to the Current Medication list given to you today.  Your physician has requested that you have a stress echocardiogram. For further information please visit [email or web address]. Please follow instruction sheet as given.  Your physician recommends that you schedule a follow-up appointment in: [place].

## 2014-04-17 NOTE — Progress Notes (Signed)
Patient ID: Briana Alvarez, female   DOB: 07/16/1960, 53 y.o.   MRN: [id number]     Patient ID: Briana Alvarez MRN: [id number] DOB/AGE: [birth year] 53 y.o.   Referring Physician Dr. Annitta Needs   Reason for Consultation chest pain  HPI: 53 y/o who has HTN ,hyperlipidemia and DM.  She was 53 when diagnosed with HTN.  She has been taking medicine or many years.  She lived in [place] and had syncope 13 years ago.  She had a clean cath at that time due to an abnormal ECG.  She was having chest pressure at that time.   More recently,  She had chest pain 3 weeks ago.  It was mild the preceding two days.  It felt like a brick on the chest.  She had to lie down.  Worse with exertion.  She went to the ER and had a negative w/u and was sent home.  Since that time, no recurrent sx.    She does have some left arm and shoulder pain when she walks. Does tend to improve even while she walks. It changes in character when she moves her left arm.   Current Outpatient Prescriptions  Medication Sig Dispense Refill  . ARIPiprazole (ABILIFY) 10 MG tablet Take 10 mg by mouth at bedtime.      Marland Kitchen atenolol (TENORMIN) 50 MG tablet Take 50 mg by mouth daily.      . DULoxetine (CYMBALTA) 60 MG capsule Take 1 capsule (60 mg total) by mouth daily.  90 capsule  3  . EPINEPHrine (EPIPEN) 0.3 mg/0.3 mL DEVI Inject 0.3 mLs (0.3 mg total) into the muscle once.  1 Device  1  . gabapentin (NEURONTIN) 300 MG capsule Take 3 capsules (900 mg total) by mouth 3 (three) times daily. TAKE 3 CAPSULES BY MOUTH 3 TIMES DAILY  270 capsule  1  . glipiZIDE (GLUCOTROL) 5 MG tablet Take 5 mg by mouth 2 (two) times daily before a meal.       . lisinopril (PRINIVIL,ZESTRIL) 30 MG tablet Take 1 tablet (30 mg total) by mouth daily.  30 tablet  4  . metFORMIN (GLUCOPHAGE) 1000 MG tablet Take 1,000 mg by mouth 2 (two) times daily with a meal.      . naproxen (NAPROSYN) 500 MG tablet Take 500 mg by mouth 2 (two) times daily as needed for mild pain.      Marland Kitchen  omeprazole (PRILOSEC) 20 MG capsule Take 1 capsule (20 mg total) by mouth daily.  30 capsule  0  . ondansetron (ZOFRAN) 4 MG tablet Take 1 tablet (4 mg total) by mouth every 6 (six) hours.  12 tablet  0  . traMADol (ULTRAM) 50 MG tablet Take 50 mg by mouth every 6 (six) hours as needed for moderate pain.      . traZODone (DESYREL) 150 MG tablet Take 150 mg by mouth at bedtime.       No current facility-administered medications for this visit.   Past Medical History  Diagnosis Date  . Diabetes mellitus   . Hypertension   . Depression   . Anxiety   . Headache     Family History  Problem Relation Age of Onset  . Cancer Mother   . Diabetes Daughter   . Diabetes Maternal Grandmother   . Heart attack Neg Hx   . Stroke Maternal Grandfather   . Hypertension Mother   . Hypertension Maternal Grandmother     History   Social History  . Marital  Status: Divorced    Spouse Name: N/A    Number of Children: N/A  . Years of Education: N/A   Occupational History  . Not on file.   Social History Main Topics  . Smoking status: Never Smoker   . Smokeless tobacco: Not on file  . Alcohol Use: No  . Drug Use: No  . Sexual Activity: Not on file   Other Topics Concern  . Not on file   Social History Narrative  . No narrative on file    Past Surgical History  Procedure Laterality Date  . Abdominal hysterectomy    . Carpal tunnel release    . Ulner nerve     . Tonsillectomy    . Neck surgery    . Knee surgery Left       (Not in a hospital admission)  Review of systems complete and found to be negative unless listed above .  No nausea, vomiting.  No fever chills, No focal weakness,  No palpitations.  Physical Exam: Filed Vitals:   04/17/14 1117  BP: 120/82  Pulse: 71    Weight: 251 lb (113.853 kg)  Physical exam:  [place]/AT EOMI No JVD, No carotid bruit RRR S1S2  No wheezing Soft. NT, nondistended No edema. No focal motor or sensory deficits Normal affect  Labs:     Lab Results  Component Value Date   WBC 6.5 03/18/2014   HGB 14.2 03/18/2014   HCT 41.5 03/18/2014   MCV 95.8 03/18/2014   PLT 130* 03/18/2014    Recent Labs Lab 04/14/14 1703  NA 139  K 4.4  CL 102  CO2 30  BUN 18  CREATININE 1.06  CALCIUM 8.9  PROT 5.9*  BILITOT 0.6  ALKPHOS 88  ALT 14  AST 17  GLUCOSE 169*   Lab Results  Component Value Date   CKTOTAL 37 04/20/2011   CKMB 2.3 04/20/2011   TROPONINI <0.30 04/20/2011    Lab Results  Component Value Date   CHOL 130 04/16/2014   CHOL 134 07/12/2013   CHOL 138 04/20/2011   Lab Results  Component Value Date   HDL 31* 04/16/2014   HDL 33* 07/12/2013   HDL 40 04/20/2011   Lab Results  Component Value Date   LDLCALC 58 04/16/2014   LDLCALC 37 07/12/2013   LDLCALC 79 04/20/2011   Lab Results  Component Value Date   TRIG 206* 04/16/2014   TRIG 322* 07/12/2013   TRIG 97 04/20/2011   Lab Results  Component Value Date   CHOLHDL 4.2 04/16/2014   CHOLHDL 4.1 07/12/2013   CHOLHDL 3.5 04/20/2011   No results found for this basename: LDLDIRECT       FFM:[id number]   ASSESSMENT AND PLAN:   Chest pain: Some typical and some atypical features. We'll plan for exercise stress echocardiogram to evaluate for ischemia.  She needs aggressive risk factor modification including blood pressure control, diabetes controlled and lipid-lowering therapy.  Triglycerides are elevated. This may improve with better glucose control.  Long-term, she would benefit from weight loss. Signed:   Mina Marble, MD, [facility] 04/17/2014, 11:40 AM

## 2014-04-23 ENCOUNTER — Other Ambulatory Visit: Payer: Self-pay | Admitting: Internal Medicine

## 2014-04-23 ENCOUNTER — Telehealth: Payer: Self-pay | Admitting: Emergency Medicine

## 2014-04-23 NOTE — Telephone Encounter (Signed)
Pt given lab results with instructions to continue low fat diet/exercise

## 2014-04-23 NOTE — Telephone Encounter (Signed)
-----   Message from Lorayne Marek, MD sent at 04/17/2014  1:10 PM EDT ----- Call and let her that her patient know that her triglycerides are improved, continue with low-fat diet and exercise.

## 2014-04-25 ENCOUNTER — Other Ambulatory Visit: Payer: Self-pay | Admitting: Internal Medicine

## 2014-04-28 ENCOUNTER — Ambulatory Visit (HOSPITAL_COMMUNITY): Payer: No Typology Code available for payment source | Attending: Internal Medicine | Admitting: Cardiology

## 2014-04-28 ENCOUNTER — Ambulatory Visit (HOSPITAL_BASED_OUTPATIENT_CLINIC_OR_DEPARTMENT_OTHER): Payer: No Typology Code available for payment source

## 2014-04-28 DIAGNOSIS — I10 Essential (primary) hypertension: Secondary | ICD-10-CM | POA: Insufficient documentation

## 2014-04-28 DIAGNOSIS — R079 Chest pain, unspecified: Secondary | ICD-10-CM | POA: Insufficient documentation

## 2014-04-28 DIAGNOSIS — E119 Type 2 diabetes mellitus without complications: Secondary | ICD-10-CM | POA: Insufficient documentation

## 2014-04-28 DIAGNOSIS — E669 Obesity, unspecified: Secondary | ICD-10-CM | POA: Insufficient documentation

## 2014-04-28 DIAGNOSIS — R0989 Other specified symptoms and signs involving the circulatory and respiratory systems: Secondary | ICD-10-CM

## 2014-04-28 NOTE — Progress Notes (Signed)
Stress echo performed.

## 2014-05-05 ENCOUNTER — Telehealth: Payer: Self-pay | Admitting: Internal Medicine

## 2014-05-05 ENCOUNTER — Other Ambulatory Visit: Payer: Self-pay | Admitting: Emergency Medicine

## 2014-05-05 MED ORDER — METFORMIN HCL 1000 MG PO TABS: 1000.0000 mg | ORAL_TABLET | Freq: Two times a day (BID) | ORAL | Status: DC

## 2014-05-05 MED ORDER — ATENOLOL 50 MG PO TABS: 50.0000 mg | ORAL_TABLET | Freq: Every day | ORAL | Status: DC

## 2014-05-05 NOTE — Telephone Encounter (Signed)
Medication refilled and e-scribed to [facility]

## 2014-05-05 NOTE — Telephone Encounter (Signed)
Patient has come in today to request medication refills for atenolol (TENORMIN) 50 MG tablet & metFORMIN (GLUCOPHAGE) 1000 MG tablet; please f/u with patient

## 2014-05-09 ENCOUNTER — Emergency Department (INDEPENDENT_AMBULATORY_CARE_PROVIDER_SITE_OTHER)
Admission: EM | Admit: 2014-05-09 | Discharge: 2014-05-09 | Disposition: A | Discharge status: Home / Self Care | Payer: No Typology Code available for payment source | Source: Home / Self Care | Attending: Family Medicine | Admitting: Family Medicine

## 2014-05-09 ENCOUNTER — Encounter (HOSPITAL_COMMUNITY): Payer: Self-pay | Admitting: Emergency Medicine

## 2014-05-09 DIAGNOSIS — J40 Bronchitis, not specified as acute or chronic: Secondary | ICD-10-CM

## 2014-05-09 MED ORDER — HYDROCODONE-ACETAMINOPHEN 5-325 MG PO TABS: 0.5000 | ORAL_TABLET | Freq: Every evening | ORAL | Status: DC | PRN

## 2014-05-09 MED ORDER — PREDNISONE 10 MG PO TABS: 30.0000 mg | ORAL_TABLET | Freq: Every day | ORAL | Status: DC

## 2014-05-09 MED ORDER — AZITHROMYCIN 250 MG PO TABS: 250.0000 mg | ORAL_TABLET | Freq: Every day | ORAL | Status: DC

## 2014-05-09 MED ORDER — IPRATROPIUM-ALBUTEROL 0.5-2.5 (3) MG/3ML IN SOLN
RESPIRATORY_TRACT | Status: AC
  Filled 2014-05-09: qty 3

## 2014-05-09 MED ORDER — IPRATROPIUM-ALBUTEROL 0.5-2.5 (3) MG/3ML IN SOLN
3.0000 mL | Freq: Once | RESPIRATORY_TRACT | Status: AC
  Administered 2014-05-09: 3 mL via RESPIRATORY_TRACT

## 2014-05-09 MED ORDER — ALBUTEROL SULFATE HFA 108 (90 BASE) MCG/ACT IN AERS: 2.0000 | INHALATION_SPRAY | Freq: Four times a day (QID) | RESPIRATORY_TRACT | Status: DC | PRN

## 2014-05-09 MED ORDER — IPRATROPIUM BROMIDE 0.06 % NA SOLN
2.0000 | Freq: Four times a day (QID) | NASAL | Status: DC
Start: 2014-05-09 — End: 2014-07-25

## 2014-05-09 NOTE — ED Provider Notes (Signed)
Briana Alvarez is a 53 y.o. female who presents to Urgent Care today for congestion wheezing diarrhea mild fever. Patient has had a few episodes of posttussive emesis. Symptoms present for several days. No significant shortness of breath or chest pain. She's tried over-the-counter medicines which helped a bit.   Past Medical History  Diagnosis Date  . Diabetes mellitus   . Hypertension   . Depression   . Anxiety   . Headache    Past Surgical History  Procedure Laterality Date  . Abdominal hysterectomy    . Carpal tunnel release    . Ulner nerve     . Tonsillectomy    . Neck surgery    . Knee surgery Left    History  Substance Use Topics  . Smoking status: Never Smoker   . Smokeless tobacco: Not on file  . Alcohol Use: No   ROS as above Medications: No current facility-administered medications for this encounter.   Current Outpatient Prescriptions  Medication Sig Dispense Refill  . albuterol (PROVENTIL HFA;VENTOLIN HFA) 108 (90 BASE) MCG/ACT inhaler Inhale 2 puffs into the lungs every 6 (six) hours as needed for wheezing or shortness of breath. 1 Inhaler 2  . ARIPiprazole (ABILIFY) 10 MG tablet Take 10 mg by mouth at bedtime.    Marland Kitchen atenolol (TENORMIN) 50 MG tablet Take 1 tablet (50 mg total) by mouth daily. 30 tablet 2  . azithromycin (ZITHROMAX) 250 MG tablet Take 1 tablet (250 mg total) by mouth daily. Take first 2 tablets together, then 1 every day until finished. 6 tablet 0  . DULoxetine (CYMBALTA) 60 MG capsule Take 1 capsule (60 mg total) by mouth daily. 90 capsule 3  . EPINEPHrine (EPIPEN) 0.3 mg/0.3 mL DEVI Inject 0.3 mLs (0.3 mg total) into the muscle once. 1 Device 1  . gabapentin (NEURONTIN) 300 MG capsule Take 3 capsules (900 mg total) by mouth 3 (three) times daily. TAKE 3 CAPSULES BY MOUTH 3 TIMES DAILY 270 capsule 1  . glipiZIDE (GLUCOTROL) 5 MG tablet Take 5 mg by mouth 2 (two) times daily before a meal.     . HYDROcodone-acetaminophen (NORCO/VICODIN) 5-325 MG per  tablet Take 0.5 tablets by mouth at bedtime as needed (cough). 6 tablet 0  . ipratropium (ATROVENT) 0.06 % nasal spray Place 2 sprays into both nostrils 4 (four) times daily. 15 mL 1  . lisinopril (PRINIVIL,ZESTRIL) 30 MG tablet Take 1 tablet (30 mg total) by mouth daily. 30 tablet 4  . metFORMIN (GLUCOPHAGE) 1000 MG tablet Take 1 tablet (1,000 mg total) by mouth 2 (two) times daily with a meal. 60 tablet 2  . naproxen (NAPROSYN) 500 MG tablet Take 500 mg by mouth 2 (two) times daily as needed for mild pain.    Marland Kitchen omeprazole (PRILOSEC) 20 MG capsule Take 1 capsule (20 mg total) by mouth daily. 30 capsule 0  . ondansetron (ZOFRAN) 4 MG tablet Take 1 tablet (4 mg total) by mouth every 6 (six) hours. 12 tablet 0  . predniSONE (DELTASONE) 10 MG tablet Take 3 tablets (30 mg total) by mouth daily. 15 tablet 0  . traMADol (ULTRAM) 50 MG tablet Take 50 mg by mouth every 6 (six) hours as needed for moderate pain.    . traZODone (DESYREL) 150 MG tablet Take 150 mg by mouth at bedtime.     Allergies  Allergen Reactions  . Shrimp [Shellfish Allergy] Anaphylaxis and Hives    Shrimp - hives     Exam:  BP 138/94 mmHg  Pulse 84  Temp(Src) 98.1 F (36.7 C) (Oral)  Resp 20  SpO2 96% Gen: Well NAD obese HEENT: EOMI,  MMM normal posterior pharynx Lungs: Normal work of breathing. CTABL Heart: RRR no MRG Abd: NABS, Soft. Nondistended, Nontender Exts: Brisk capillary refill, warm and well perfused.   Patient was given a 2.5/0.5 mg DuoNeb nebulizer treatment and felt a little better.  No results found for this or any previous visit (from the past 24 hour(s)). No results found.  Assessment and Plan: 53 y.o. female with bronchitis. Treatment with albuterol Atrovent nasal spray codeine containing cough medication and azithromycin. Use prednisone if not better.  Discussed warning signs or symptoms. Please see discharge instructions. Patient expresses understanding.     Gregor Hams, MD 05/09/14  [phone]

## 2014-05-09 NOTE — ED Notes (Signed)
Pt states that she has had congestion diarrhea and pt reports low grade fever. Pt is in no acute distress at this time.

## 2014-05-09 NOTE — Discharge Instructions (Signed)
Thank you for coming in today. Take prednisone if not getting better. Follow-up with primary care provider Call or go to the emergency room if you get worse, have trouble breathing, have chest pains, or palpitations.   Acute Bronchitis Bronchitis is inflammation of the airways that extend from the windpipe into the lungs (bronchi). The inflammation often causes mucus to develop. This leads to a cough, which is the most common symptom of bronchitis.  In acute bronchitis, the condition usually develops suddenly and goes away over time, usually in a couple weeks. Smoking, allergies, and asthma can make bronchitis worse. Repeated episodes of bronchitis may cause further lung problems.  CAUSES Acute bronchitis is most often caused by the same virus that causes a cold. The virus can spread from person to person (contagious) through coughing, sneezing, and touching contaminated objects. SIGNS AND SYMPTOMS   Cough.   Fever.   Coughing up mucus.   Body aches.   Chest congestion.   Chills.   Shortness of breath.   Sore throat.  DIAGNOSIS  Acute bronchitis is usually diagnosed through a physical exam. Your health care provider will also ask you questions about your medical history. Tests, such as chest X-rays, are sometimes done to rule out other conditions.  TREATMENT  Acute bronchitis usually goes away in a couple weeks. Oftentimes, no medical treatment is necessary. Medicines are sometimes given for relief of fever or cough. Antibiotic medicines are usually not needed but may be prescribed in certain situations. In some cases, an inhaler may be recommended to help reduce shortness of breath and control the cough. A cool mist vaporizer may also be used to help thin bronchial secretions and make it easier to clear the chest.  HOME CARE INSTRUCTIONS  Get plenty of rest.   Drink enough fluids to keep your urine clear or pale yellow (unless you have a medical condition that requires  fluid restriction). Increasing fluids may help thin your respiratory secretions (sputum) and reduce chest congestion, and it will prevent dehydration.   Take medicines only as directed by your health care provider.  If you were prescribed an antibiotic medicine, finish it all even if you start to feel better.  Avoid smoking and secondhand smoke. Exposure to cigarette smoke or irritating chemicals will make bronchitis worse. If you are a smoker, consider using nicotine gum or skin patches to help control withdrawal symptoms. Quitting smoking will help your lungs heal faster.   Reduce the chances of another bout of acute bronchitis by washing your hands frequently, avoiding people with cold symptoms, and trying not to touch your hands to your mouth, nose, or eyes.   Keep all follow-up visits as directed by your health care provider.  SEEK MEDICAL CARE IF: Your symptoms do not improve after 1 week of treatment.  SEEK IMMEDIATE MEDICAL CARE IF:  You develop an increased fever or chills.   You have chest pain.   You have severe shortness of breath.  You have bloody sputum.   You develop dehydration.  You faint or repeatedly feel like you are going to pass out.  You develop repeated vomiting.  You develop a severe headache. MAKE SURE YOU:   Understand these instructions.  Will watch your condition.  Will get help right away if you are not doing well or get worse. Document Released: 07/14/2004 Document Revised: 10/21/2013 Document Reviewed: 11/27/2012 [facility] Patient Information 2015 [place], [place]. This information is not intended to replace advice given to you by your health care  provider. Make sure you discuss any questions you have with your health care provider. ° °

## 2014-05-12 ENCOUNTER — Encounter: Payer: Self-pay | Admitting: Interventional Cardiology

## 2014-06-02 ENCOUNTER — Other Ambulatory Visit: Payer: Self-pay | Admitting: Internal Medicine

## 2014-06-03 ENCOUNTER — Telehealth: Payer: Self-pay | Admitting: Internal Medicine

## 2014-06-03 ENCOUNTER — Other Ambulatory Visit: Payer: Self-pay

## 2014-06-03 MED ORDER — GABAPENTIN 300 MG PO CAPS: 900.0000 mg | ORAL_CAPSULE | Freq: Three times a day (TID) | ORAL | Status: DC

## 2014-06-03 NOTE — Telephone Encounter (Signed)
Patient has come in today to request a refill on medication refill for gabapentin (NEURONTIN) 300 MG capsule; patient is unsure why medication refill is on a month to month basis; please f/u with patient about her concerns

## 2014-06-03 NOTE — Progress Notes (Unsigned)
Patient here in office requesting a refill on her gabapentin [facility]

## 2014-06-09 ENCOUNTER — Other Ambulatory Visit: Payer: Self-pay | Admitting: Internal Medicine

## 2014-06-16 ENCOUNTER — Encounter: Payer: Self-pay | Admitting: Internal Medicine

## 2014-06-16 ENCOUNTER — Ambulatory Visit: Payer: No Typology Code available for payment source | Attending: Internal Medicine | Admitting: Internal Medicine

## 2014-06-16 VITALS — BP 138/95 | HR 70 | Temp 98.0°F | Resp 16 | Wt 251.2 lb

## 2014-06-16 DIAGNOSIS — F419 Anxiety disorder, unspecified: Secondary | ICD-10-CM | POA: Insufficient documentation

## 2014-06-16 DIAGNOSIS — E139 Other specified diabetes mellitus without complications: Secondary | ICD-10-CM

## 2014-06-16 DIAGNOSIS — E11649 Type 2 diabetes mellitus with hypoglycemia without coma: Secondary | ICD-10-CM | POA: Insufficient documentation

## 2014-06-16 DIAGNOSIS — Z7952 Long term (current) use of systemic steroids: Secondary | ICD-10-CM | POA: Insufficient documentation

## 2014-06-16 DIAGNOSIS — F329 Major depressive disorder, single episode, unspecified: Secondary | ICD-10-CM | POA: Insufficient documentation

## 2014-06-16 DIAGNOSIS — Z79899 Other long term (current) drug therapy: Secondary | ICD-10-CM | POA: Insufficient documentation

## 2014-06-16 DIAGNOSIS — Z23 Encounter for immunization: Secondary | ICD-10-CM | POA: Insufficient documentation

## 2014-06-16 DIAGNOSIS — Z0000 Encounter for general adult medical examination without abnormal findings: Secondary | ICD-10-CM

## 2014-06-16 DIAGNOSIS — D229 Melanocytic nevi, unspecified: Secondary | ICD-10-CM

## 2014-06-16 DIAGNOSIS — I10 Essential (primary) hypertension: Secondary | ICD-10-CM

## 2014-06-16 DIAGNOSIS — E781 Pure hyperglyceridemia: Secondary | ICD-10-CM

## 2014-06-16 LAB — COMPLETE METABOLIC PANEL WITH GFR
ALBUMIN: 3.9 g/dL (ref 3.5–5.2)
ALK PHOS: 118 U/L — AB (ref 39–117)
ALT: 19 U/L (ref 0–35)
AST: 20 U/L (ref 0–37)
BUN: 13 mg/dL (ref 6–23)
CALCIUM: 9.4 mg/dL (ref 8.4–10.5)
CHLORIDE: 105 meq/L (ref 96–112)
CO2: 29 mEq/L (ref 19–32)
Creat: 1.03 mg/dL (ref 0.50–1.10)
GFR, Est African American: 71 mL/min
GFR, Est Non African American: 62 mL/min
Glucose, Bld: 123 mg/dL — ABNORMAL HIGH (ref 70–99)
POTASSIUM: 4.5 meq/L (ref 3.5–5.3)
SODIUM: 138 meq/L (ref 135–145)
Total Bilirubin: 0.4 mg/dL (ref 0.2–1.2)
Total Protein: 5.9 g/dL — ABNORMAL LOW (ref 6.0–8.3)

## 2014-06-16 LAB — LIPID PANEL
Cholesterol: 131 mg/dL (ref 0–200)
HDL: 34 mg/dL — ABNORMAL LOW (ref >39)
LDL Cholesterol: 58 mg/dL (ref 0–99)
Total CHOL/HDL Ratio: 3.9 Ratio
Triglycerides: 197 mg/dL — ABNORMAL HIGH (ref <150)
VLDL: 39 mg/dL (ref 0–40)

## 2014-06-16 LAB — POCT GLYCOSYLATED HEMOGLOBIN (HGB A1C): HEMOGLOBIN A1C: 5.8

## 2014-06-16 LAB — GLUCOSE, POCT (MANUAL RESULT ENTRY): POC Glucose: 164 mg/dl — AB (ref 70–99)

## 2014-06-16 NOTE — Progress Notes (Signed)
MRN: [id number] Name: Briana Alvarez  Sex: female Age: 53 y.o. DOB: January 08, 1961  Allergies: Shrimp  Chief Complaint  Patient presents with  . Follow-up    HPI: Patient is 53 y.o. female who has history of diabetes hypertension hypertriglyceridemia comes today for followup , as per patient she is taking metformin and Glucotrol and sometimes her blood sugar drops to 70s and she gets symptoms of hypoglycemia, today her A1c is 5.8%, a blood pressure is borderline elevated, she denies any headache dizziness chest and shortness of breath, she is requesting Tdap booster today,patient also reported to have personal history of skin cancer for which she was treated but for the last one year she has noticed the mole which has increased in size , she's concerned and is requesting to see the specialist. Patient is up-to-date with flu shot. She had a mammogram done this year.  Past Medical History  Diagnosis Date  . Diabetes mellitus   . Hypertension   . Depression   . Anxiety   . Headache     Past Surgical History  Procedure Laterality Date  . Abdominal hysterectomy    . Carpal tunnel release    . Ulner nerve     . Tonsillectomy    . Neck surgery    . Knee surgery Left       Medication List       This list is accurate as of: 06/16/14  9:33 AM.  Always use your most recent med list.               albuterol 108 (90 BASE) MCG/ACT inhaler  Commonly known as:  PROVENTIL HFA;VENTOLIN HFA  Inhale 2 puffs into the lungs every 6 (six) hours as needed for wheezing or shortness of breath.     ARIPiprazole 10 MG tablet  Commonly known as:  ABILIFY  Take 10 mg by mouth at bedtime.     atenolol 50 MG tablet  Commonly known as:  TENORMIN  Take 1 tablet (50 mg total) by mouth daily.     azithromycin 250 MG tablet  Commonly known as:  ZITHROMAX  Take 1 tablet (250 mg total) by mouth daily. Take first 2 tablets together, then 1 every day until finished.     DULoxetine 60 MG capsule    Commonly known as:  CYMBALTA  Take 1 capsule (60 mg total) by mouth daily.     EPINEPHrine 0.3 mg/0.3 mL Devi  Commonly known as:  EPIPEN  Inject 0.3 mLs (0.3 mg total) into the muscle once.     gabapentin 300 MG capsule  Commonly known as:  NEURONTIN  Take 3 capsules (900 mg total) by mouth 3 (three) times daily. TAKE 3 CAPSULES BY MOUTH 3 TIMES DAILY     glipiZIDE 5 MG tablet  Commonly known as:  GLUCOTROL  Take 5 mg by mouth 2 (two) times daily before a meal.     HYDROcodone-acetaminophen 5-325 MG per tablet  Commonly known as:  NORCO/VICODIN  Take 0.5 tablets by mouth at bedtime as needed (cough).     ipratropium 0.06 % nasal spray  Commonly known as:  ATROVENT  Place 2 sprays into both nostrils 4 (four) times daily.     lisinopril 30 MG tablet  Commonly known as:  PRINIVIL,ZESTRIL  Take 1 tablet (30 mg total) by mouth daily.     metFORMIN 1000 MG tablet  Commonly known as:  GLUCOPHAGE  Take 1 tablet (1,000 mg total) by mouth 2 (  two) times daily with a meal.     naproxen 500 MG tablet  Commonly known as:  NAPROSYN  Take 500 mg by mouth 2 (two) times daily as needed for mild pain.     omeprazole 20 MG capsule  Commonly known as:  PRILOSEC  Take 1 capsule (20 mg total) by mouth daily.     ondansetron 4 MG tablet  Commonly known as:  ZOFRAN  Take 1 tablet (4 mg total) by mouth every 6 (six) hours.     predniSONE 10 MG tablet  Commonly known as:  DELTASONE  Take 3 tablets (30 mg total) by mouth daily.     traMADol 50 MG tablet  Commonly known as:  ULTRAM  Take 50 mg by mouth every 6 (six) hours as needed for moderate pain.     traZODone 150 MG tablet  Commonly known as:  DESYREL  Take 150 mg by mouth at bedtime.        No orders of the defined types were placed in this encounter.    Immunization History  Administered Date(s) Administered  . Influenza,inj,Quad PF,36+ Mos 04/14/2014  . Tdap 06/16/2014    Family History  Problem Relation Age of Onset   . Cancer Mother   . Diabetes Daughter   . Diabetes Maternal Grandmother   . Heart attack Neg Hx   . Stroke Maternal Grandfather   . Hypertension Mother   . Hypertension Maternal Grandmother     History  Substance Use Topics  . Smoking status: Never Smoker   . Smokeless tobacco: Not on file  . Alcohol Use: No    Review of Systems   As noted in HPI  Filed Vitals:   06/16/14 0917  BP: 138/95  Pulse: 70  Temp: 98 F (36.7 C)  Resp: 16    Physical Exam  Physical Exam  Eyes: EOM are normal. Pupils are equal, round, and reactive to light.  Cardiovascular: Normal rate and regular rhythm.   Pulmonary/Chest: Breath sounds normal. No respiratory distress. She has no wheezes. She has no rales.  Musculoskeletal: She exhibits no edema.  Skin:  Mole on the right upper arm    CBC    Component Value Date/Time   WBC 6.5 03/18/2014 1100   RBC 4.33 03/18/2014 1100   HGB 14.2 03/18/2014 1100   HCT 41.5 03/18/2014 1100   PLT 130* 03/18/2014 1100   MCV 95.8 03/18/2014 1100   LYMPHSABS 2.6 07/12/2013 1606   MONOABS 0.6 07/12/2013 1606   EOSABS 0.1 07/12/2013 1606   BASOSABS 0.0 07/12/2013 1606    CMP     Component Value Date/Time   NA 139 04/14/2014 1703   K 4.4 04/14/2014 1703   CL 102 04/14/2014 1703   CO2 30 04/14/2014 1703   GLUCOSE 169* 04/14/2014 1703   BUN 18 04/14/2014 1703   CREATININE 1.06 04/14/2014 1703   CREATININE 0.95 03/18/2014 1118   CALCIUM 8.9 04/14/2014 1703   PROT 5.9* 04/14/2014 1703   ALBUMIN 4.0 04/14/2014 1703   AST 17 04/14/2014 1703   ALT 14 04/14/2014 1703   ALKPHOS 88 04/14/2014 1703   BILITOT 0.6 04/14/2014 1703   GFRNONAA 60 04/14/2014 1703   GFRNONAA 67* 03/18/2014 1118   GFRAA 69 04/14/2014 1703   GFRAA 78* 03/18/2014 1118    Lab Results  Component Value Date/Time   CHOL 130 04/16/2014 09:12 AM    No components found for: HGA1C  Lab Results  Component Value Date/Time   AST  17 04/14/2014 05:03 PM    Assessment and  Plan  Other specified diabetes mellitus without complications - Plan:  Results for orders placed or performed in visit on 06/16/14  Glucose (CBG)  Result Value Ref Range   POC Glucose 164.0 (A) 70 - 99 mg/dl  HgB A1c  Result Value Ref Range   Hemoglobin A1C 5.8    Hemoglobin A1c has trended down, occasional hypoglycemic symptoms, I have advised patient to discontinue Glucotrol, she'll continue with metformin, advise for diabetes planning, will repeat A1c in 3 months.  Preventative health care - Plan: Tdap vaccine greater than or equal to 7yo IM  Essential hypertension, benign - Plan: blood pressure is borderline elevated, she's currently on atenolol and lisinopril, I have advised patient for DASH diet, reevaluate on the next visit COMPLETE METABOLIC PANEL WITH GFR  Hypertriglyceridemia - Plan:advised patient for low fat diet, repeat her  Lipid panel  Nevus - Plan: Ambulatory referral to Dermatology   Health Maintenance  -Mammogram: uptodate  -Vaccinations:  uptodate with the flu shot   Return in about 3 months (around 09/15/2014) for hypertension, diabetes, hyperipidemia.  Lorayne Marek, MD

## 2014-06-16 NOTE — Patient Instructions (Addendum)
Food Choices to Lower Your Triglycerides  Triglycerides are a type of fat in your blood. High levels of triglycerides can increase the risk of heart disease and stroke. If your triglyceride levels are high, the foods you eat and your eating habits are very important. Choosing the right foods can help lower your triglycerides.  WHAT GENERAL GUIDELINES DO I NEED TO FOLLOW?  Lose weight if you are overweight.   Limit or avoid alcohol.   Fill one half of your plate with vegetables and green salads.   Limit fruit to two servings a day. Choose fruit instead of juice.   Make one fourth of your plate whole grains. Look for the word "whole" as the first word in the ingredient list.  Fill one fourth of your plate with lean protein foods.  Enjoy fatty fish (such as salmon, mackerel, sardines, and tuna) three times a week.   Choose healthy fats.   Limit foods high in starch and sugar.  Eat more home-cooked food and less restaurant, buffet, and fast food.  Limit fried foods.  Cook foods using methods other than frying.  Limit saturated fats.  Check ingredient lists to avoid foods with partially hydrogenated oils (trans fats) in them. WHAT FOODS CAN I EAT?  Grains Whole grains, such as whole wheat or whole grain breads, crackers, cereals, and pasta. Unsweetened oatmeal, bulgur, barley, quinoa, or brown rice. Corn or whole wheat flour tortillas.  Vegetables Fresh or frozen vegetables (raw, steamed, roasted, or grilled). Green salads. Fruits All fresh, canned (in natural juice), or frozen fruits. Meat and Other Protein Products Ground beef (85% or leaner), grass-fed beef, or beef trimmed of fat. Skinless chicken or turkey. Ground chicken or turkey. Pork trimmed of fat. All fish and seafood. Eggs. Dried beans, peas, or lentils. Unsalted nuts or seeds. Unsalted canned or dry beans. Dairy Low-fat dairy products, such as skim or 1% milk, 2% or reduced-fat cheeses, low-fat ricotta or  cottage cheese, or plain low-fat yogurt. Fats and Oils Tub margarines without trans fats. Light or reduced-fat mayonnaise and salad dressings. Avocado. Safflower, olive, or canola oils. Natural peanut or almond butter. The items listed above may not be a complete list of recommended foods or beverages. Contact your dietitian for more options. WHAT FOODS ARE NOT RECOMMENDED?  Grains White bread. White pasta. White rice. Cornbread. Bagels, pastries, and croissants. Crackers that contain trans fat. Vegetables White potatoes. Corn. Creamed or fried vegetables. Vegetables in a cheese sauce. Fruits Dried fruits. Canned fruit in light or heavy syrup. Fruit juice. Meat and Other Protein Products Fatty cuts of meat. Ribs, chicken wings, bacon, sausage, bologna, salami, chitterlings, fatback, hot dogs, bratwurst, and packaged luncheon meats. Dairy Whole or 2% milk, cream, half-and-half, and cream cheese. Whole-fat or sweetened yogurt. Full-fat cheeses. Nondairy creamers and whipped toppings. Processed cheese, cheese spreads, or cheese curds. Sweets and Desserts Corn syrup, sugars, honey, and molasses. Candy. Jam and jelly. Syrup. Sweetened cereals. Cookies, pies, cakes, donuts, muffins, and ice cream. Fats and Oils Butter, stick margarine, lard, shortening, ghee, or bacon fat. Coconut, palm kernel, or palm oils. Beverages Alcohol. Sweetened drinks (such as sodas, lemonade, and fruit drinks or punches). The items listed above may not be a complete list of foods and beverages to avoid. Contact your dietitian for more information. Document Released: 03/24/2004 Document Revised: 06/11/2013 Document Reviewed: 04/10/2013 [employer] Patient Information 2015 [employer]. This information is not intended to replace advice given to you by your health care provider. Make sure you discuss   any questions you have with your health care provider. DASH Eating Plan DASH stands for "Dietary Approaches to Stop  Hypertension." The DASH eating plan is a healthy eating plan that has been shown to reduce high blood pressure (hypertension). Additional health benefits may include reducing the risk of type 2 diabetes mellitus, heart disease, and stroke. The DASH eating plan may also help with weight loss. WHAT DO I NEED TO KNOW ABOUT THE DASH EATING PLAN? For the DASH eating plan, you will follow these general guidelines:  Choose foods with a percent daily value for sodium of less than 5% (as listed on the food label).  Use salt-free seasonings or herbs instead of table salt or sea salt.  Check with your health care provider or pharmacist before using salt substitutes.  Eat lower-sodium products, often labeled as "lower sodium" or "no salt added."  Eat fresh foods.  Eat more vegetables, fruits, and low-fat dairy products.  Choose whole grains. Look for the word "whole" as the first word in the ingredient list.  Choose fish and skinless chicken or [place] more often than red meat. Limit fish, poultry, and meat to 6 oz (170 g) each day.  Limit sweets, desserts, sugars, and sugary drinks.  Choose heart-healthy fats.  Limit cheese to 1 oz (28 g) per day.  Eat more home-cooked food and less restaurant, buffet, and fast food.  Limit fried foods.  Cook foods using methods other than frying.  Limit canned vegetables. If you do use them, rinse them well to decrease the sodium.  When eating at a restaurant, ask that your food be prepared with less salt, or no salt if possible. WHAT FOODS CAN I EAT? Seek help from a dietitian for individual calorie needs. Grains Whole grain or whole wheat bread. Brown rice. Whole grain or whole wheat pasta. Quinoa, bulgur, and whole grain cereals. Low-sodium cereals. Corn or whole wheat flour tortillas. Whole grain cornbread. Whole grain crackers. Low-sodium crackers. Vegetables Fresh or frozen vegetables (raw, steamed, roasted, or grilled). Low-sodium or  reduced-sodium tomato and vegetable juices. Low-sodium or reduced-sodium tomato sauce and paste. Low-sodium or reduced-sodium canned vegetables.  Fruits All fresh, canned (in natural juice), or frozen fruits. Meat and Other Protein Products Ground beef (85% or leaner), grass-fed beef, or beef trimmed of fat. Skinless chicken or [place]. Ground chicken or [place]. Pork trimmed of fat. All fish and seafood. Eggs. Dried beans, peas, or lentils. Unsalted nuts and seeds. Unsalted canned beans. Dairy Low-fat dairy products, such as skim or 1% milk, 2% or reduced-fat cheeses, low-fat ricotta or cottage cheese, or plain low-fat yogurt. Low-sodium or reduced-sodium cheeses. Fats and Oils Tub margarines without trans fats. Light or reduced-fat mayonnaise and salad dressings (reduced sodium). Avocado. Safflower, olive, or canola oils. Natural peanut or almond butter. Other Unsalted popcorn and pretzels. The items listed above may not be a complete list of recommended foods or beverages. Contact your dietitian for more options. WHAT FOODS ARE NOT RECOMMENDED? Grains White bread. White pasta. White rice. Refined cornbread. Bagels and croissants. Crackers that contain trans fat. Vegetables Creamed or fried vegetables. Vegetables in a cheese sauce. Regular canned vegetables. Regular canned tomato sauce and paste. Regular tomato and vegetable juices. Fruits Dried fruits. Canned fruit in light or heavy syrup. Fruit juice. Meat and Other Protein Products Fatty cuts of meat. Ribs, chicken wings, bacon, sausage, bologna, salami, chitterlings, fatback, hot dogs, bratwurst, and packaged luncheon meats. Salted nuts and seeds. Canned beans with salt. Dairy Whole or 2% milk,  cream, half-and-half, and cream cheese. Whole-fat or sweetened yogurt. Full-fat cheeses or blue cheese. Nondairy creamers and whipped toppings. Processed cheese, cheese spreads, or cheese curds. Condiments Onion and garlic salt, seasoned salt,  table salt, and sea salt. Canned and packaged gravies. Worcestershire sauce. Tartar sauce. Barbecue sauce. Teriyaki sauce. Soy sauce, including reduced sodium. Steak sauce. Fish sauce. Oyster sauce. Cocktail sauce. Horseradish. Ketchup and mustard. Meat flavorings and tenderizers. Bouillon cubes. Hot sauce. Tabasco sauce. Marinades. Taco seasonings. Relishes. Fats and Oils Butter, stick margarine, lard, shortening, ghee, and bacon fat. Coconut, palm kernel, or palm oils. Regular salad dressings. Other Pickles and olives. Salted popcorn and pretzels. The items listed above may not be a complete list of foods and beverages to avoid. Contact your dietitian for more information. WHERE CAN I FIND MORE INFORMATION? National Heart, Lung, and Blood Institute: travelstabloid.com Document Released: 05/26/2011 Document Revised: 10/21/2013 Document Reviewed: 04/10/2013 [facility] Patient Information 2015 [place], [place]. This information is not intended to replace advice given to you by your health care provider. Make sure you discuss any questions you have with your health care provider. Diabetes Mellitus and Food It is important for you to manage your blood sugar (glucose) level. Your blood glucose level can be greatly affected by what you eat. Eating healthier foods in the appropriate amounts throughout the day at about the same time each day will help you control your blood glucose level. It can also help slow or prevent worsening of your diabetes mellitus. Healthy eating may even help you improve the level of your blood pressure and reach or maintain a healthy weight.  HOW CAN FOOD AFFECT ME? Carbohydrates Carbohydrates affect your blood glucose level more than any other type of food. Your dietitian will help you determine how many carbohydrates to eat at each meal and teach you how to count carbohydrates. Counting carbohydrates is important to keep your blood glucose at a  healthy level, especially if you are using insulin or taking certain medicines for diabetes mellitus. Alcohol Alcohol can cause sudden decreases in blood glucose (hypoglycemia), especially if you use insulin or take certain medicines for diabetes mellitus. Hypoglycemia can be a life-threatening condition. Symptoms of hypoglycemia (sleepiness, dizziness, and disorientation) are similar to symptoms of having too much alcohol.  If your health care provider has given you approval to drink alcohol, do so in moderation and use the following guidelines:  Women should not have more than one drink per day, and men should not have more than two drinks per day. One drink is equal to:  12 oz of beer.  5 oz of wine.  1 oz of hard liquor.  Do not drink on an empty stomach.  Keep yourself hydrated. Have water, diet soda, or unsweetened iced tea.  Regular soda, juice, and other mixers might contain a lot of carbohydrates and should be counted. WHAT FOODS ARE NOT RECOMMENDED? As you make food choices, it is important to remember that all foods are not the same. Some foods have fewer nutrients per serving than other foods, even though they might have the same number of calories or carbohydrates. It is difficult to get your body what it needs when you eat foods with fewer nutrients. Examples of foods that you should avoid that are high in calories and carbohydrates but low in nutrients include:  Trans fats (most processed foods list trans fats on the Nutrition Facts label).  Regular soda.  Juice.  Candy.  Sweets, such as cake, pie, doughnuts, and cookies.  Fried foods. WHAT FOODS CAN I EAT? Have nutrient-rich foods, which will nourish your body and keep you healthy. The food you should eat also will depend on several factors, including:  The calories you need.  The medicines you take.  Your weight.  Your blood glucose level.  Your blood pressure level.  Your cholesterol level. You also  should eat a variety of foods, including:  Protein, such as meat, poultry, fish, tofu, nuts, and seeds (lean animal proteins are best).  Fruits.  Vegetables.  Dairy products, such as milk, cheese, and yogurt (low fat is best).  Breads, grains, pasta, cereal, rice, and beans.  Fats such as olive oil, trans fat-free margarine, canola oil, avocado, and olives. DOES EVERYONE WITH DIABETES MELLITUS HAVE THE SAME MEAL PLAN? Because every person with diabetes mellitus is different, there is not one meal plan that works for everyone. It is very important that you meet with a dietitian who will help you create a meal plan that is just right for you. Document Released: 03/03/2005 Document Revised: 06/11/2013 Document Reviewed: 05/03/2013 [facility] Patient Information 2015 [place], [place]. This information is not intended to replace advice given to you by your health care provider. Make sure you discuss any questions you have with your health care provider.

## 2014-06-16 NOTE — Progress Notes (Signed)
Patient here for follow up on her diabetes and hypertension Requesting the TDAP vaccine due to the fact she will be taking care Of an infant Concerned about a mole to her upper right arm

## 2014-06-25 ENCOUNTER — Telehealth: Payer: Self-pay | Admitting: *Deleted

## 2014-06-25 NOTE — Telephone Encounter (Signed)
-----   Message from Lorayne Marek, MD sent at 06/17/2014  1:55 PM EST ----- Blood work reviewed, noticed elevated triglycerides, advise patient for low fat diet.

## 2014-06-25 NOTE — Telephone Encounter (Signed)
Pt aware of lab results. Advised to maintain low fat diet and exercise

## 2014-06-30 ENCOUNTER — Other Ambulatory Visit: Payer: Self-pay | Admitting: Internal Medicine

## 2014-07-04 ENCOUNTER — Encounter (HOSPITAL_COMMUNITY): Payer: Self-pay

## 2014-07-04 ENCOUNTER — Emergency Department (INDEPENDENT_AMBULATORY_CARE_PROVIDER_SITE_OTHER)
Admission: EM | Admit: 2014-07-04 | Discharge: 2014-07-04 | Disposition: A | Discharge status: Home / Self Care | Payer: Self-pay | Source: Home / Self Care | Attending: Family Medicine | Admitting: Family Medicine

## 2014-07-04 DIAGNOSIS — L509 Urticaria, unspecified: Secondary | ICD-10-CM

## 2014-07-04 MED ORDER — HYDROXYZINE HCL 25 MG PO TABS: 25.0000 mg | ORAL_TABLET | Freq: Three times a day (TID) | ORAL | Status: DC | PRN

## 2014-07-04 MED ORDER — PREDNISONE 10 MG PO TABS: 20.0000 mg | ORAL_TABLET | Freq: Every day | ORAL | Status: DC

## 2014-07-04 MED ORDER — FAMOTIDINE 20 MG PO TABS: 20.0000 mg | ORAL_TABLET | Freq: Two times a day (BID) | ORAL | Status: DC

## 2014-07-04 NOTE — ED Notes (Signed)
C/o raised red rash x 2 days. NAD

## 2014-07-04 NOTE — Discharge Instructions (Signed)
Hives Hives are itchy, red, swollen areas of the skin. They can vary in size and location on your body. Hives can come and go for hours or several days (acute hives) or for several weeks (chronic hives). Hives do not spread from person to person (noncontagious). They may get worse with scratching, exercise, and emotional stress. CAUSES   Allergic reaction to food, additives, or drugs.  Infections, including the common cold.  Illness, such as vasculitis, lupus, or thyroid disease.  Exposure to sunlight, heat, or cold.  Exercise.  Stress.  Contact with chemicals. SYMPTOMS   Red or white swollen patches on the skin. The patches may change size, shape, and location quickly and repeatedly.  Itching.  Swelling of the hands, feet, and face. This may occur if hives develop deeper in the skin. DIAGNOSIS  Your caregiver can usually tell what is wrong by performing a physical exam. Skin or blood tests may also be done to determine the cause of your hives. In some cases, the cause cannot be determined. TREATMENT  Mild cases usually get better with medicines such as antihistamines. Severe cases may require an emergency epinephrine injection. If the cause of your hives is known, treatment includes avoiding that trigger.  HOME CARE INSTRUCTIONS   Avoid causes that trigger your hives.  Take antihistamines as directed by your caregiver to reduce the severity of your hives. Non-sedating or low-sedating antihistamines are usually recommended. Do not drive while taking an antihistamine.  Take any other medicines prescribed for itching as directed by your caregiver.  Wear loose-fitting clothing.  Keep all follow-up appointments as directed by your caregiver. SEEK MEDICAL CARE IF:   You have persistent or severe itching that is not relieved with medicine.  You have painful or swollen joints. SEEK IMMEDIATE MEDICAL CARE IF:   You have a fever.  Your tongue or lips are swollen.  You have  trouble breathing or swallowing.  You feel tightness in the throat or chest.  You have abdominal pain. These problems may be the first sign of a life-threatening allergic reaction. Call your local emergency services (911 in [place] MAKE SURE YOU:   Understand these instructions.  Will watch your condition.  Will get help right away if you are not doing well or get worse. Document Released: 06/06/2005 Document Revised: 06/11/2013 Document Reviewed: 08/30/2011 [employer] Patient Information 2015 [employer]. This information is not intended to replace advice given to you by your health care provider. Make sure you discuss any questions you have with your health care provider.  

## 2014-07-04 NOTE — ED Notes (Signed)
HPI Comments: Patient reports she has been under stress lately as she lives with her daughter and her daughter recently lost her job and there has been great concern in the household about how bills are going to be paid.  States she developed a rash that began on her chest that is very pruritic 2 days ago and has since developed similar lesions on left leg and thigh, right lower leg, right upper arm, right elbow, and new lesion has developed this morning at left wrist.  Has tried treating lesions with topical neosporin and bleach with no improvement.  PCP: [place]  No new medication or exposures. States she feels otherwise well.  Patient is a 54 y.o. female presenting with rash. The history is provided by the patient.  Rash   Past Medical History   Diagnosis  Date   .  Diabetes mellitus    .  Hypertension    .  Depression    .  Anxiety    .  Headache     Past Surgical History   Procedure  Laterality  Date   .  Abdominal hysterectomy     .  Carpal tunnel release     .  Ulner nerve     .  Tonsillectomy     .  Neck surgery     .  Knee surgery  Left     Family History   Problem  Relation  Age of Onset   .  Cancer  Mother    .  Diabetes  Daughter    .  Diabetes  Maternal Grandmother    .  Heart attack  Neg Hx    .  Stroke  Maternal Grandfather    .  Hypertension  Mother    .  Hypertension  Maternal Grandmother     History   Substance Use Topics   .  Smoking status:  Never Smoker   .  Smokeless tobacco:  Not on file   .  Alcohol Use:  No    OB History    No data available      Review of Systems  Skin: Positive for rash.  All other systems reviewed and are negative.   Allergies   Shrimp  Home Medications    Prior to Admission medications   Medication  Sig  Start Date  End Date  Taking?  Authorizing Provider   albuterol (PROVENTIL HFA;VENTOLIN HFA) 108 (90 BASE) MCG/ACT inhaler  Inhale 2 puffs into the lungs every 6 (six) hours as needed for wheezing or shortness of  breath.  05/09/14    Gregor Hams, MD   ARIPiprazole (ABILIFY) 10 MG tablet  Take 10 mg by mouth at bedtime.     Historical Provider, MD   atenolol (TENORMIN) 50 MG tablet  Take 1 tablet (50 mg total) by mouth daily.  05/05/14    Lorayne Marek, MD   azithromycin (ZITHROMAX) 250 MG tablet  Take 1 tablet (250 mg total) by mouth daily. Take first 2 tablets together, then 1 every day until finished.  05/09/14    Gregor Hams, MD   DULoxetine (CYMBALTA) 60 MG capsule  Take 1 capsule (60 mg total) by mouth daily.  07/16/13    Tresa Garter, MD   EPINEPHrine (EPIPEN) 0.3 mg/0.3 mL DEVI  Inject 0.3 mLs (0.3 mg total) into the muscle once.  06/28/12    Ernst Spell, MD   famotidine (PEPCID) 20 MG tablet  Take 1 tablet (20  mg total) by mouth 2 (two) times daily. X 7 days  07/04/14    Audelia Hives Rosanne Wohlfarth, PA   gabapentin (NEURONTIN) 300 MG capsule  Take 3 capsules (900 mg total) by mouth 3 (three) times daily. TAKE 3 CAPSULES BY MOUTH 3 TIMES DAILY  06/03/14    Lorayne Marek, MD   glipiZIDE (GLUCOTROL) 5 MG tablet  TAKE 1 TABLET BY MOUTH 2 TIMES DAILY BEFORE A MEAL  07/02/14    Lorayne Marek, MD   HYDROcodone-acetaminophen (NORCO/VICODIN) 5-325 MG per tablet  Take 0.5 tablets by mouth at bedtime as needed (cough).  05/09/14    Gregor Hams, MD   hydrOXYzine (ATARAX/VISTARIL) 25 MG tablet  Take 1 tablet (25 mg total) by mouth 3 (three) times daily as needed for itching.  07/04/14    Audelia Hives Daena Alper, PA   ipratropium (ATROVENT) 0.06 % nasal spray  Place 2 sprays into both nostrils 4 (four) times daily.  05/09/14    Gregor Hams, MD   lisinopril (PRINIVIL,ZESTRIL) 30 MG tablet  Take 1 tablet (30 mg total) by mouth daily.  07/12/13    Lorayne Marek, MD   metFORMIN (GLUCOPHAGE) 1000 MG tablet  Take 1 tablet (1,000 mg total) by mouth 2 (two) times daily with a meal.  05/05/14    Lorayne Marek, MD   naproxen (NAPROSYN) 500 MG tablet  Take 500 mg by mouth 2 (two) times daily as needed for mild pain.  01/08/14     Lorayne Marek, MD   omeprazole (PRILOSEC) 20 MG capsule  Take 1 capsule (20 mg total) by mouth daily.  03/18/14    Merryl Hacker, MD   ondansetron (ZOFRAN) 4 MG tablet  Take 1 tablet (4 mg total) by mouth every 6 (six) hours.  03/06/14    Elwyn Lade, PA-C   predniSONE (DELTASONE) 10 MG tablet  Take 2 tablets (20 mg total) by mouth daily. X 3 days  07/04/14    Audelia Hives Orrie Schubert, PA   traMADol (ULTRAM) 50 MG tablet  Take 50 mg by mouth every 6 (six) hours as needed for moderate pain.  02/18/14    John C Pick-Jacobs, DO   traZODone (DESYREL) 150 MG tablet  Take 150 mg by mouth at bedtime.     Historical Provider, MD    BP 170/95 mmHg  Pulse 66  Temp(Src) 97.9 F (36.6 C) (Oral)  Resp 18  SpO2 97%  Physical Exam  Constitutional: She is oriented to person, place, and time. She appears well-developed and well-nourished. No distress.  +overweight  HENT:  Head: Normocephalic and atraumatic.  Mouth/Throat: Oropharynx is clear and moist.  No swelling of lips or tongue. Phonation normal. No oral lesions  Eyes: Conjunctivae are normal. No scleral icterus.  Cardiovascular: Normal rate.  Pulmonary/Chest: Effort normal.  Musculoskeletal: Normal range of motion.  Neurological: She is alert and oriented to person, place, and time.  Skin: Skin is warm and dry. Rash noted.  Large discrete blanching raised macules (urticaria) at left leg, left thigh, center of chest, right lower leg, right upper arm, right elbow and left wrist.  Psychiatric: She has a normal mood and affect. Her behavior is normal.  Nursing note and vitals reviewed.   ED Course   Procedures (including critical care time)  Labs Review  Labs Reviewed - No data to display  Imaging Review   Imaging Results (Last 48 hours)   No results found.   MDM  1.  Urticaria   Atarax, pepcid and prednisone as directed with PCP follow up if no improvement. I suspect this is stress induced urticaria. Advised ER eval if suddenly  worse.  Lutricia Feil, PA  07/04/14 [place], [place] 07/04/14 [phone]

## 2014-07-04 NOTE — ED Provider Notes (Deleted)
CSN: [id number]     Arrival date & time 07/04/14  0957 History   First MD Initiated Contact with Patient 07/04/14 1023     Chief Complaint  Patient presents with  . Rash   (Consider location/radiation/quality/duration/timing/severity/associated sxs/prior Treatment) HPI Comments: Patient reports she has been under stress lately as she lives with her daughter and her daughter recently lost her job and there has been great concern in the household about how bills are going to be paid.  States she developed a rash that began on her chest that is very pruritic 2 days ago and has since developed similar lesions on left leg and thigh, right lower leg, right upper arm, right elbow, and new lesion has developed this morning at left wrist. Has tried treating lesions with topical neosporin and bleach with no improvement.  PCP: [place] No new medication or exposures. States she feels otherwise well.   Patient is a 54 y.o. female presenting with rash. The history is provided by the patient.  Rash   Past Medical History  Diagnosis Date  . Diabetes mellitus   . Hypertension   . Depression   . Anxiety   . Headache    Past Surgical History  Procedure Laterality Date  . Abdominal hysterectomy    . Carpal tunnel release    . Ulner nerve     . Tonsillectomy    . Neck surgery    . Knee surgery Left    Family History  Problem Relation Age of Onset  . Cancer Mother   . Diabetes Daughter   . Diabetes Maternal Grandmother   . Heart attack Neg Hx   . Stroke Maternal Grandfather   . Hypertension Mother   . Hypertension Maternal Grandmother    History  Substance Use Topics  . Smoking status: Never Smoker   . Smokeless tobacco: Not on file  . Alcohol Use: No   OB History    No data available     Review of Systems  Skin: Positive for rash.  All other systems reviewed and are negative.   Allergies  Shrimp  Home Medications   Prior to Admission medications   Medication Sig Start Date  End Date Taking? Authorizing Provider  albuterol (PROVENTIL HFA;VENTOLIN HFA) 108 (90 BASE) MCG/ACT inhaler Inhale 2 puffs into the lungs every 6 (six) hours as needed for wheezing or shortness of breath. 05/09/14   Gregor Hams, MD  ARIPiprazole (ABILIFY) 10 MG tablet Take 10 mg by mouth at bedtime.    Historical Provider, MD  atenolol (TENORMIN) 50 MG tablet Take 1 tablet (50 mg total) by mouth daily. 05/05/14   Lorayne Marek, MD  azithromycin (ZITHROMAX) 250 MG tablet Take 1 tablet (250 mg total) by mouth daily. Take first 2 tablets together, then 1 every day until finished. 05/09/14   Gregor Hams, MD  DULoxetine (CYMBALTA) 60 MG capsule Take 1 capsule (60 mg total) by mouth daily. 07/16/13   Tresa Garter, MD  EPINEPHrine (EPIPEN) 0.3 mg/0.3 mL DEVI Inject 0.3 mLs (0.3 mg total) into the muscle once. 06/28/12   Ernst Spell, MD  famotidine (PEPCID) 20 MG tablet Take 1 tablet (20 mg total) by mouth 2 (two) times daily. X 7 days 07/04/14   Audelia Hives Presson, PA  gabapentin (NEURONTIN) 300 MG capsule Take 3 capsules (900 mg total) by mouth 3 (three) times daily. TAKE 3 CAPSULES BY MOUTH 3 TIMES DAILY 06/03/14   Lorayne Marek, MD  glipiZIDE (GLUCOTROL) 5  MG tablet TAKE 1 TABLET BY MOUTH 2 TIMES DAILY BEFORE A MEAL 07/02/14   Lorayne Marek, MD  HYDROcodone-acetaminophen (NORCO/VICODIN) 5-325 MG per tablet Take 0.5 tablets by mouth at bedtime as needed (cough). 05/09/14   Gregor Hams, MD  hydrOXYzine (ATARAX/VISTARIL) 25 MG tablet Take 1 tablet (25 mg total) by mouth 3 (three) times daily as needed for itching. 07/04/14   Audelia Hives Presson, PA  ipratropium (ATROVENT) 0.06 % nasal spray Place 2 sprays into both nostrils 4 (four) times daily. 05/09/14   Gregor Hams, MD  lisinopril (PRINIVIL,ZESTRIL) 30 MG tablet Take 1 tablet (30 mg total) by mouth daily. 07/12/13   Lorayne Marek, MD  metFORMIN (GLUCOPHAGE) 1000 MG tablet Take 1 tablet (1,000 mg total) by mouth 2 (two) times daily with a  meal. 05/05/14   Lorayne Marek, MD  naproxen (NAPROSYN) 500 MG tablet Take 500 mg by mouth 2 (two) times daily as needed for mild pain. 01/08/14   Lorayne Marek, MD  omeprazole (PRILOSEC) 20 MG capsule Take 1 capsule (20 mg total) by mouth daily. 03/18/14   Merryl Hacker, MD  ondansetron (ZOFRAN) 4 MG tablet Take 1 tablet (4 mg total) by mouth every 6 (six) hours. 03/06/14   Elwyn Lade, PA-C  predniSONE (DELTASONE) 10 MG tablet Take 2 tablets (20 mg total) by mouth daily. X 3 days 07/04/14   Audelia Hives Presson, PA  traMADol (ULTRAM) 50 MG tablet Take 50 mg by mouth every 6 (six) hours as needed for moderate pain. 02/18/14   John C Pick-Jacobs, DO  traZODone (DESYREL) 150 MG tablet Take 150 mg by mouth at bedtime.    Historical Provider, MD   BP 170/95 mmHg  Pulse 66  Temp(Src) 97.9 F (36.6 C) (Oral)  Resp 18  SpO2 97% Physical Exam  Constitutional: She is oriented to person, place, and time. She appears well-developed and well-nourished. No distress.  +overweight  HENT:  Head: Normocephalic and atraumatic.  Mouth/Throat: Oropharynx is clear and moist.  No swelling of lips or tongue. Phonation normal. No oral lesions  Eyes: Conjunctivae are normal. No scleral icterus.  Cardiovascular: Normal rate.   Pulmonary/Chest: Effort normal.  Musculoskeletal: Normal range of motion.  Neurological: She is alert and oriented to person, place, and time.  Skin: Skin is warm and dry. Rash noted.  Large discrete blanching raised macules (urticaria) at left leg, left thigh, center of chest, right lower leg, right upper arm, right elbow and left wrist.   Psychiatric: She has a normal mood and affect. Her behavior is normal.  Nursing note and vitals reviewed.   ED Course  Procedures (including critical care time) Labs Review Labs Reviewed - No data to display  Imaging Review No results found.   MDM   1. Urticaria   Atarax, pepcid and prednisone as directed with PCP follow up if no  improvement. I suspect this is stress induced urticaria. Advised ER eval if suddenly worse.     Lutricia Feil, [place] 07/04/14 1049

## 2014-07-22 ENCOUNTER — Ambulatory Visit: Payer: No Typology Code available for payment source | Admitting: Interventional Cardiology

## 2014-07-25 ENCOUNTER — Ambulatory Visit (INDEPENDENT_AMBULATORY_CARE_PROVIDER_SITE_OTHER): Payer: No Typology Code available for payment source | Admitting: Interventional Cardiology

## 2014-07-25 ENCOUNTER — Encounter: Payer: Self-pay | Admitting: Interventional Cardiology

## 2014-07-25 ENCOUNTER — Telehealth: Payer: Self-pay | Admitting: General Practice

## 2014-07-25 VITALS — BP 130/100 | HR 60 | Ht 66.0 in | Wt 252.0 lb

## 2014-07-25 DIAGNOSIS — E781 Pure hyperglyceridemia: Secondary | ICD-10-CM

## 2014-07-25 DIAGNOSIS — E119 Type 2 diabetes mellitus without complications: Secondary | ICD-10-CM

## 2014-07-25 DIAGNOSIS — I10 Essential (primary) hypertension: Secondary | ICD-10-CM

## 2014-07-25 MED ORDER — BENAZEPRIL HCL 10 MG PO TABS: 10.0000 mg | ORAL_TABLET | Freq: Every day | ORAL | Status: DC

## 2014-07-25 NOTE — Progress Notes (Signed)
Patient ID: Briana Alvarez, female   DOB: 1961-01-30, 54 y.o.   MRN: [id number] Patient ID: Briana Alvarez, female   DOB: 05-10-1961, 54 y.o.   MRN: [id number]     Patient ID: Briana Alvarez MRN: [id number] DOB/AGE: 1960/12/17 54 y.o.   Referring Physician Dr. Annitta Needs   Reason for Consultation chest pain  HPI: 54 y/o who has HTN ,hyperlipidemia and DM.  She was 54 when diagnosed with HTN.  She has been taking medicine or many years.  She lived in [place] and had syncope 13 years ago.  She had a clean cath at that time due to an abnormal ECG.  She was having chest pressure at that time.   More recently,  She had chest pain in 10/15.  It was mild the preceding two days. Then It felt like a brick on the chest.  She had to lie down.  Worse with exertion.  She went to the ER and had a negative w/u and was sent home.  Since that time, no recurrent sx.    She had a negative stress in 11/15.  No further chest pain since that time.  SHe has been walking regularly. She has some left shoulder pain with moving it a certain way.   She is concerned that she has spikes in her BP.  It has been as high as 200/108.  Another reading was 146/103.  130/100 is  A typical reading.  She hsa been on atenolol for many years and wants to change.     Current Outpatient Prescriptions  Medication Sig Dispense Refill  . albuterol (PROVENTIL HFA;VENTOLIN HFA) 108 (90 BASE) MCG/ACT inhaler Inhale 2 puffs into the lungs every 6 (six) hours as needed for wheezing or shortness of breath. 1 Inhaler 2  . ARIPiprazole (ABILIFY) 10 MG tablet Take 10 mg by mouth at bedtime.    Marland Kitchen atenolol (TENORMIN) 50 MG tablet Take 1 tablet (50 mg total) by mouth daily. 30 tablet 2  . DULoxetine (CYMBALTA) 60 MG capsule Take 1 capsule (60 mg total) by mouth daily. 90 capsule 3  . EPINEPHrine (EPIPEN) 0.3 mg/0.3 mL DEVI Inject 0.3 mLs (0.3 mg total) into the muscle once. 1 Device 1  . gabapentin (NEURONTIN) 300 MG capsule Take 3 capsules (900 mg total)  by mouth 3 (three) times daily. TAKE 3 CAPSULES BY MOUTH 3 TIMES DAILY 270 capsule 1  . glipiZIDE (GLUCOTROL) 5 MG tablet TAKE 1 TABLET BY MOUTH 2 TIMES DAILY BEFORE A MEAL 60 tablet 0  . metFORMIN (GLUCOPHAGE) 1000 MG tablet Take 1 tablet (1,000 mg total) by mouth 2 (two) times daily with a meal. 60 tablet 2  . traMADol (ULTRAM) 50 MG tablet Take 50 mg by mouth every 6 (six) hours as needed for moderate pain.    . traZODone (DESYREL) 150 MG tablet Take 150 mg by mouth at bedtime.     No current facility-administered medications for this visit.   Past Medical History  Diagnosis Date  . Diabetes mellitus   . Hypertension   . Depression   . Anxiety   . Headache     Family History  Problem Relation Age of Onset  . Cancer Mother   . Diabetes Daughter   . Diabetes Maternal Grandmother   . Heart attack Neg Hx   . Hypertension Mother   . Hypertension Maternal Grandmother   . Stroke Neg Hx     History   Social History  . Marital Status: Divorced    Spouse  Name: N/A    Number of Children: N/A  . Years of Education: N/A   Occupational History  . Not on file.   Social History Main Topics  . Smoking status: Never Smoker   . Smokeless tobacco: Not on file  . Alcohol Use: No  . Drug Use: No  . Sexual Activity: Not on file   Other Topics Concern  . Not on file   Social History Narrative    Past Surgical History  Procedure Laterality Date  . Abdominal hysterectomy    . Carpal tunnel release    . Ulner nerve     . Tonsillectomy    . Neck surgery    . Knee surgery Left       (Not in a hospital admission)  Review of systems complete and found to be negative unless listed above .  No nausea, vomiting.  No fever chills, No focal weakness,  No palpitations.  Physical Exam: Filed Vitals:   07/25/14 1620  BP: 130/100  Pulse: 60    Weight: 252 lb (114.306 kg)  Physical exam:  [place]/AT EOMI No JVD, No carotid bruit RRR S1S2  No wheezing Soft. NT, nondistended No  edema. No focal motor or sensory deficits Normal affect  Labs:   Lab Results  Component Value Date   WBC 6.5 03/18/2014   HGB 14.2 03/18/2014   HCT 41.5 03/18/2014   MCV 95.8 03/18/2014   PLT 130* 03/18/2014   No results for input(s): NA, K, CL, CO2, BUN, CREATININE, CALCIUM, PROT, BILITOT, ALKPHOS, ALT, AST, GLUCOSE in the last 168 hours.  Invalid input(s): LABALBU Lab Results  Component Value Date   CKTOTAL 37 04/20/2011   CKMB 2.3 04/20/2011   TROPONINI <0.30 04/20/2011    Lab Results  Component Value Date   CHOL 131 06/16/2014   CHOL 130 04/16/2014   CHOL 134 07/12/2013   Lab Results  Component Value Date   HDL 34* 06/16/2014   HDL 31* 04/16/2014   HDL 33* 07/12/2013   Lab Results  Component Value Date   LDLCALC 58 06/16/2014   LDLCALC 58 04/16/2014   LDLCALC 37 07/12/2013   Lab Results  Component Value Date   TRIG 197* 06/16/2014   TRIG 206* 04/16/2014   TRIG 322* 07/12/2013   Lab Results  Component Value Date   CHOLHDL 3.9 06/16/2014   CHOLHDL 4.2 04/16/2014   CHOLHDL 4.1 07/12/2013   No results found for: LDLDIRECT     IRW:[id number]   ASSESSMENT AND PLAN:   Chest pain: Negative exercise stress echocardiogram.  Sx have resolved.  She will let us know she has any further symptoms.  She needs aggressive risk factor modification including blood pressure control, diabetes controlled and lipid-lowering therapy.  Triglycerides are elevated. This may improve with better glucose control.  Long-term, she would benefit from weight loss.  HTN: Stop atenolol and start benazepril 10 mg daily.  She had some dizziness when lisinopril was used in the past, but given her diabetes, they're many secondary benefits to using an ACE inhibitor.  No cough reported at that time.  She will have electrolytes checked with her primary care doctor in a week. Low pressure check also in a week with her primary care doctor. I will see her back in a year.  Signed:   Mina Marble, MD, [facility] 07/25/2014, 4:36 PM

## 2014-07-25 NOTE — Telephone Encounter (Signed)
Patient calling to schedule appointment with PCP due to issues that she has been experiencing after one of her medications was DC'd at the time of her last OV. Appointment scheduled for OV, med management on ... 07/31/14.  Patient is ok with this appt. But in the mean time she wants to speak to a nurse to see if she should start back up on the medication that PCP [place] as she has had swelling in feet and high blood sugars. Please follow up with pt. To address concerns.

## 2014-07-25 NOTE — Patient Instructions (Signed)
Your physician has recommended you make the following change in your medication:  1) Stop atenolol 2) Start benazepril 10 mg one tablet by mouth once daily  Your physician recommends that you have lab work in: 1 week with your primary care doctor  Your physician recommends that you schedule a follow-up appointment in: 1 week with your primary care doctor.  Your physician wants you to follow-up in: 1 year with Dr. Irish Lack. You will receive a reminder letter in the mail two months in advance. If you don't receive a letter, please call our office to schedule the follow-up appointment.

## 2014-07-31 ENCOUNTER — Ambulatory Visit: Payer: Self-pay | Admitting: Internal Medicine

## 2014-08-12 ENCOUNTER — Ambulatory Visit: Payer: No Typology Code available for payment source | Attending: Internal Medicine | Admitting: Internal Medicine

## 2014-08-12 ENCOUNTER — Encounter: Payer: Self-pay | Admitting: Internal Medicine

## 2014-08-12 VITALS — BP 160/90 | HR 76 | Temp 98.0°F | Resp 16 | Wt 250.0 lb

## 2014-08-12 DIAGNOSIS — E139 Other specified diabetes mellitus without complications: Secondary | ICD-10-CM

## 2014-08-12 DIAGNOSIS — M545 Low back pain, unspecified: Secondary | ICD-10-CM

## 2014-08-12 DIAGNOSIS — E119 Type 2 diabetes mellitus without complications: Secondary | ICD-10-CM | POA: Insufficient documentation

## 2014-08-12 DIAGNOSIS — G8929 Other chronic pain: Secondary | ICD-10-CM | POA: Insufficient documentation

## 2014-08-12 DIAGNOSIS — IMO0001 Reserved for inherently not codable concepts without codable children: Secondary | ICD-10-CM

## 2014-08-12 DIAGNOSIS — I10 Essential (primary) hypertension: Secondary | ICD-10-CM

## 2014-08-12 DIAGNOSIS — R030 Elevated blood-pressure reading, without diagnosis of hypertension: Secondary | ICD-10-CM

## 2014-08-12 LAB — GLUCOSE, POCT (MANUAL RESULT ENTRY): POC GLUCOSE: 103 mg/dL — AB (ref 70–99)

## 2014-08-12 MED ORDER — ACETAMINOPHEN-CODEINE #3 300-30 MG PO TABS: 1.0000 | ORAL_TABLET | Freq: Three times a day (TID) | ORAL | Status: DC | PRN

## 2014-08-12 MED ORDER — METFORMIN HCL 1000 MG PO TABS: 1000.0000 mg | ORAL_TABLET | Freq: Two times a day (BID) | ORAL | Status: DC

## 2014-08-12 MED ORDER — GLIPIZIDE 5 MG PO TABS: ORAL_TABLET | ORAL | Status: DC

## 2014-08-12 MED ORDER — CLONIDINE HCL 0.1 MG PO TABS
0.1000 mg | ORAL_TABLET | Freq: Once | ORAL | Status: AC
  Administered 2014-08-12: 0.1 mg via ORAL

## 2014-08-12 NOTE — Progress Notes (Signed)
Patient here for follow up Patient states recently saw her cardiologist and he switched her  to lotensin 10mg  Patient states her blood pressure has been up and down Was not able to finish her stress test due to SOB Patient complains of bilateral hip pain and pain behind her right knee that radiates down to  Her ankle

## 2014-08-12 NOTE — Progress Notes (Signed)
MRN: [id number] Name: Briana Alvarez  Sex: female Age: 54 y.o. DOB: 1961-01-01  Allergies: Shrimp  Chief Complaint  Patient presents with  . Follow-up    HPI: Patient is 54 y.o. female who has history of diabetes hypertension, chronic lumbar pain with radiculopathy, patient has been following up with orthopedics and as per patient she had steroid injections given, she's currently taking Cymbalta and Neurontin still complaining of pain denies any fever chills any incontinence, patient also was seen by her cardiologist 2 weeks ago and she was given a prescription to start taking benazepril and atenolol was discontinued , as per patient she has not started taking the new medication yet she is going to get it filled today, today her blood pressure was elevated, she was given clonidine, repeat manual blood pressure is 160/90. Past Medical History  Diagnosis Date  . Diabetes mellitus   . Hypertension   . Depression   . Anxiety   . Headache     Past Surgical History  Procedure Laterality Date  . Abdominal hysterectomy    . Carpal tunnel release    . Ulner nerve     . Tonsillectomy    . Neck surgery    . Knee surgery Left       Medication List       This list is accurate as of: 08/12/14 12:26 PM.  Always use your most recent med list.               acetaminophen-codeine 300-30 MG per tablet  Commonly known as:  TYLENOL #3  Take 1 tablet by mouth every 8 (eight) hours as needed for moderate pain.     albuterol 108 (90 BASE) MCG/ACT inhaler  Commonly known as:  PROVENTIL HFA;VENTOLIN HFA  Inhale 2 puffs into the lungs every 6 (six) hours as needed for wheezing or shortness of breath.     ARIPiprazole 10 MG tablet  Commonly known as:  ABILIFY  Take 10 mg by mouth at bedtime.     benazepril 10 MG tablet  Commonly known as:  LOTENSIN  Take 1 tablet (10 mg total) by mouth daily.     DULoxetine 60 MG capsule  Commonly known as:  CYMBALTA  Take 1 capsule (60 mg total) by  mouth daily.     EPINEPHrine 0.3 mg/0.3 mL Devi  Commonly known as:  EPIPEN  Inject 0.3 mLs (0.3 mg total) into the muscle once.     gabapentin 300 MG capsule  Commonly known as:  NEURONTIN  Take 3 capsules (900 mg total) by mouth 3 (three) times daily. TAKE 3 CAPSULES BY MOUTH 3 TIMES DAILY     glipiZIDE 5 MG tablet  Commonly known as:  GLUCOTROL  TAKE 1 TABLET BY MOUTH 2 TIMES DAILY BEFORE A MEAL     metFORMIN 1000 MG tablet  Commonly known as:  GLUCOPHAGE  Take 1 tablet (1,000 mg total) by mouth 2 (two) times daily with a meal.     traMADol 50 MG tablet  Commonly known as:  ULTRAM  Take 50 mg by mouth every 6 (six) hours as needed for moderate pain.     traZODone 150 MG tablet  Commonly known as:  DESYREL  Take 150 mg by mouth at bedtime.        Meds ordered this encounter  Medications  . cloNIDine (CATAPRES) tablet 0.1 mg    Sig:   . glipiZIDE (GLUCOTROL) 5 MG tablet    Sig: TAKE 1  TABLET BY MOUTH 2 TIMES DAILY BEFORE A MEAL    Dispense:  60 tablet    Refill:  3  . metFORMIN (GLUCOPHAGE) 1000 MG tablet    Sig: Take 1 tablet (1,000 mg total) by mouth 2 (two) times daily with a meal.    Dispense:  60 tablet    Refill:  3  . acetaminophen-codeine (TYLENOL #3) 300-30 MG per tablet    Sig: Take 1 tablet by mouth every 8 (eight) hours as needed for moderate pain.    Dispense:  30 tablet    Refill:  0    Immunization History  Administered Date(s) Administered  . Influenza,inj,Quad PF,36+ Mos 04/14/2014  . Tdap 06/16/2014    Family History  Problem Relation Age of Onset  . Cancer Mother   . Diabetes Daughter   . Diabetes Maternal Grandmother   . Heart attack Neg Hx   . Hypertension Mother   . Hypertension Maternal Grandmother   . Stroke Neg Hx     History  Substance Use Topics  . Smoking status: Never Smoker   . Smokeless tobacco: Not on file  . Alcohol Use: No    Review of Systems   As noted in HPI  Filed Vitals:   08/12/14 1210  BP: 160/90    Pulse:   Temp:   Resp:     Physical Exam  Physical Exam  Constitutional: No distress.  Eyes: EOM are normal. Pupils are equal, round, and reactive to light.  Cardiovascular: Normal rate and regular rhythm.   Pulmonary/Chest: Breath sounds normal. No respiratory distress. She has no wheezes. She has no rales.  Musculoskeletal: She exhibits no edema.    CBC    Component Value Date/Time   WBC 6.5 03/18/2014 1100   RBC 4.33 03/18/2014 1100   HGB 14.2 03/18/2014 1100   HCT 41.5 03/18/2014 1100   PLT 130* 03/18/2014 1100   MCV 95.8 03/18/2014 1100   LYMPHSABS 2.6 07/12/2013 1606   MONOABS 0.6 07/12/2013 1606   EOSABS 0.1 07/12/2013 1606   BASOSABS 0.0 07/12/2013 1606    CMP     Component Value Date/Time   NA 138 06/16/2014 0938   K 4.5 06/16/2014 0938   CL 105 06/16/2014 0938   CO2 29 06/16/2014 0938   GLUCOSE 123* 06/16/2014 0938   BUN 13 06/16/2014 0938   CREATININE 1.03 06/16/2014 0938   CREATININE 0.95 03/18/2014 1118   CALCIUM 9.4 06/16/2014 0938   PROT 5.9* 06/16/2014 0938   ALBUMIN 3.9 06/16/2014 0938   AST 20 06/16/2014 0938   ALT 19 06/16/2014 0938   ALKPHOS 118* 06/16/2014 0938   BILITOT 0.4 06/16/2014 0938   GFRNONAA 62 06/16/2014 0938   GFRNONAA 67* 03/18/2014 1118   GFRAA 71 06/16/2014 0938   GFRAA 78* 03/18/2014 1118    Lab Results  Component Value Date/Time   CHOL 131 06/16/2014 09:38 AM    No components found for: HGA1C  Lab Results  Component Value Date/Time   AST 20 06/16/2014 09:38 AM    Assessment and Plan  Elevated blood pressure - Plan: cloNIDine (CATAPRES) tablet 0.1 mg, repeat manual blood pressure is 160/90.  Other specified diabetes mellitus without complications - Plan: Glucose (CBG), glipiZIDE (GLUCOTROL) 5 MG tablet, metFORMIN (GLUCOPHAGE) 1000 MG tablet  Chronic low back pain - Plan: she will continue with Cymbalta, Neurontin, as prescribed acetaminophen-codeine (TYLENOL #3) 300-30 MG per tablet take as  needed  Essential hypertension - Plan:advised patient for DASH diet, she'll start  taking captopril, ordered blood chemistry which she will do in 2 weeks time . COMPLETE METABOLIC PANEL WITH GFR   Health Maintenance  -Vaccinations:  Up-to-date with flu shot  Return in about 3 months (around 11/10/2014) for diabetes, hypertension, BP check and CMP lab  in 3 weeks/Nurse Visit.   This note has been created with Surveyor, quantity. Any transcriptional errors are unintentional.    Lorayne Marek, MD

## 2014-08-12 NOTE — Patient Instructions (Signed)
DASH Eating Plan °DASH stands for "Dietary Approaches to Stop Hypertension." The DASH eating plan is a healthy eating plan that has been shown to reduce high blood pressure (hypertension). Additional health benefits may include reducing the risk of type 2 diabetes mellitus, heart disease, and stroke. The DASH eating plan may also help with weight loss. °WHAT DO I NEED TO KNOW ABOUT THE DASH EATING PLAN? °For the DASH eating plan, you will follow these general guidelines: °· Choose foods with a percent daily value for sodium of less than 5% (as listed on the food label). °· Use salt-free seasonings or herbs instead of table salt or sea salt. °· Check with your health care provider or pharmacist before using salt substitutes. °· Eat lower-sodium products, often labeled as "lower sodium" or "no salt added." °· Eat fresh foods. °· Eat more vegetables, fruits, and low-fat dairy products. °· Choose whole grains. Look for the word "whole" as the first word in the ingredient list. °· Choose fish and skinless chicken or turkey more often than red meat. Limit fish, poultry, and meat to 6 oz (170 g) each day. °· Limit sweets, desserts, sugars, and sugary drinks. °· Choose heart-healthy fats. °· Limit cheese to 1 oz (28 g) per day. °· Eat more home-cooked food and less restaurant, buffet, and fast food. °· Limit fried foods. °· Cook foods using methods other than frying. °· Limit canned vegetables. If you do use them, rinse them well to decrease the sodium. °· When eating at a restaurant, ask that your food be prepared with less salt, or no salt if possible. °WHAT FOODS CAN I EAT? °Seek help from a dietitian for individual calorie needs. °Grains °Whole grain or whole wheat bread. Brown rice. Whole grain or whole wheat pasta. Quinoa, bulgur, and whole grain cereals. Low-sodium cereals. Corn or whole wheat flour tortillas. Whole grain cornbread. Whole grain crackers. Low-sodium crackers. °Vegetables °Fresh or frozen vegetables  (raw, steamed, roasted, or grilled). Low-sodium or reduced-sodium tomato and vegetable juices. Low-sodium or reduced-sodium tomato sauce and paste. Low-sodium or reduced-sodium canned vegetables.  °Fruits °All fresh, canned (in natural juice), or frozen fruits. °Meat and Other Protein Products °Ground beef (85% or leaner), grass-fed beef, or beef trimmed of fat. Skinless chicken or turkey. Ground chicken or turkey. Pork trimmed of fat. All fish and seafood. Eggs. Dried beans, peas, or lentils. Unsalted nuts and seeds. Unsalted canned beans. °Dairy °Low-fat dairy products, such as skim or 1% milk, 2% or reduced-fat cheeses, low-fat ricotta or cottage cheese, or plain low-fat yogurt. Low-sodium or reduced-sodium cheeses. °Fats and Oils °Tub margarines without trans fats. Light or reduced-fat mayonnaise and salad dressings (reduced sodium). Avocado. Safflower, olive, or canola oils. Natural peanut or almond butter. °Other °Unsalted popcorn and pretzels. °The items listed above may not be a complete list of recommended foods or beverages. Contact your dietitian for more options. °WHAT FOODS ARE NOT RECOMMENDED? °Grains °White bread. White pasta. White rice. Refined cornbread. Bagels and croissants. Crackers that contain trans fat. °Vegetables °Creamed or fried vegetables. Vegetables in a cheese sauce. Regular canned vegetables. Regular canned tomato sauce and paste. Regular tomato and vegetable juices. °Fruits °Dried fruits. Canned fruit in light or heavy syrup. Fruit juice. °Meat and Other Protein Products °Fatty cuts of meat. Ribs, chicken wings, bacon, sausage, bologna, salami, chitterlings, fatback, hot dogs, bratwurst, and packaged luncheon meats. Salted nuts and seeds. Canned beans with salt. °Dairy °Whole or 2% milk, cream, half-and-half, and cream cheese. Whole-fat or sweetened yogurt. Full-fat   cheeses or blue cheese. Nondairy creamers and whipped toppings. Processed cheese, cheese spreads, or cheese  curds. °Condiments °Onion and garlic salt, seasoned salt, table salt, and sea salt. Canned and packaged gravies. Worcestershire sauce. Tartar sauce. Barbecue sauce. Teriyaki sauce. Soy sauce, including reduced sodium. Steak sauce. Fish sauce. Oyster sauce. Cocktail sauce. Horseradish. Ketchup and mustard. Meat flavorings and tenderizers. Bouillon cubes. Hot sauce. Tabasco sauce. Marinades. Taco seasonings. Relishes. °Fats and Oils °Butter, stick margarine, lard, shortening, ghee, and bacon fat. Coconut, palm kernel, or palm oils. Regular salad dressings. °Other °Pickles and olives. Salted popcorn and pretzels. °The items listed above may not be a complete list of foods and beverages to avoid. Contact your dietitian for more information. °WHERE CAN I FIND MORE INFORMATION? °[employer], [employer], [employer]: www.nhlbi.nih.gov/health/health-topics/topics/dash/ °Document Released: 05/26/2011 Document Revised: 10/21/2013 Document Reviewed: 04/10/2013 °[employer]® Patient Information ©2015 [employer]. This information is not intended to replace advice given to you by your health care provider. Make sure you discuss any questions you have with your health care provider. ° °

## 2014-08-13 ENCOUNTER — Telehealth: Payer: Self-pay | Admitting: General Practice

## 2014-08-13 ENCOUNTER — Other Ambulatory Visit: Payer: Self-pay | Admitting: Internal Medicine

## 2014-08-13 ENCOUNTER — Other Ambulatory Visit: Payer: Self-pay | Admitting: *Deleted

## 2014-08-13 MED ORDER — DULOXETINE HCL 60 MG PO CPEP: 60.0000 mg | ORAL_CAPSULE | Freq: Every day | ORAL | Status: DC

## 2014-08-13 MED ORDER — GABAPENTIN 300 MG PO CAPS: 900.0000 mg | ORAL_CAPSULE | Freq: Three times a day (TID) | ORAL | Status: DC

## 2014-08-13 NOTE — Telephone Encounter (Signed)
Patient presents to clinic to request medication refill for the following medications: gabapentin (NEURONTIN) 300 MG capsule   DULoxetine (CYMBALTA) 60 MG capsule.. Call tranferred to nurse line. Junious Dresser is assisting with this request. Thanks

## 2014-08-18 ENCOUNTER — Ambulatory Visit: Payer: No Typology Code available for payment source | Attending: Internal Medicine

## 2014-08-22 ENCOUNTER — Other Ambulatory Visit: Payer: Self-pay

## 2014-08-22 MED ORDER — DULOXETINE HCL 60 MG PO CPEP
60.0000 mg | ORAL_CAPSULE | Freq: Every day | ORAL | Status: DC
Start: 2014-08-22 — End: 2014-12-16

## 2014-09-05 ENCOUNTER — Encounter: Payer: Self-pay | Admitting: Internal Medicine

## 2014-09-05 ENCOUNTER — Ambulatory Visit: Payer: No Typology Code available for payment source | Attending: Internal Medicine | Admitting: Internal Medicine

## 2014-09-05 VITALS — BP 136/93 | HR 78 | Temp 98.0°F | Resp 16 | Wt 249.0 lb

## 2014-09-05 DIAGNOSIS — G8929 Other chronic pain: Secondary | ICD-10-CM | POA: Insufficient documentation

## 2014-09-05 DIAGNOSIS — E139 Other specified diabetes mellitus without complications: Secondary | ICD-10-CM

## 2014-09-05 DIAGNOSIS — M25512 Pain in left shoulder: Secondary | ICD-10-CM | POA: Insufficient documentation

## 2014-09-05 LAB — GLUCOSE, POCT (MANUAL RESULT ENTRY): POC GLUCOSE: 88 mg/dL (ref 70–99)

## 2014-09-05 MED ORDER — ACETAMINOPHEN-CODEINE #3 300-30 MG PO TABS: 1.0000 | ORAL_TABLET | Freq: Three times a day (TID) | ORAL | Status: DC | PRN

## 2014-09-05 NOTE — Patient Instructions (Signed)
DASH Eating Plan °DASH stands for "Dietary Approaches to Stop Hypertension." The DASH eating plan is a healthy eating plan that has been shown to reduce high blood pressure (hypertension). Additional health benefits may include reducing the risk of type 2 diabetes mellitus, heart disease, and stroke. The DASH eating plan may also help with weight loss. °WHAT DO I NEED TO KNOW ABOUT THE DASH EATING PLAN? °For the DASH eating plan, you will follow these general guidelines: °· Choose foods with a percent daily value for sodium of less than 5% (as listed on the food label). °· Use salt-free seasonings or herbs instead of table salt or sea salt. °· Check with your health care provider or pharmacist before using salt substitutes. °· Eat lower-sodium products, often labeled as "lower sodium" or "no salt added." °· Eat fresh foods. °· Eat more vegetables, fruits, and low-fat dairy products. °· Choose whole grains. Look for the word "whole" as the first word in the ingredient list. °· Choose fish and skinless chicken or turkey more often than red meat. Limit fish, poultry, and meat to 6 oz (170 g) each day. °· Limit sweets, desserts, sugars, and sugary drinks. °· Choose heart-healthy fats. °· Limit cheese to 1 oz (28 g) per day. °· Eat more home-cooked food and less restaurant, buffet, and fast food. °· Limit fried foods. °· Cook foods using methods other than frying. °· Limit canned vegetables. If you do use them, rinse them well to decrease the sodium. °· When eating at a restaurant, ask that your food be prepared with less salt, or no salt if possible. °WHAT FOODS CAN I EAT? °Seek help from a dietitian for individual calorie needs. °Grains °Whole grain or whole wheat bread. Brown rice. Whole grain or whole wheat pasta. Quinoa, bulgur, and whole grain cereals. Low-sodium cereals. Corn or whole wheat flour tortillas. Whole grain cornbread. Whole grain crackers. Low-sodium crackers. °Vegetables °Fresh or frozen vegetables  (raw, steamed, roasted, or grilled). Low-sodium or reduced-sodium tomato and vegetable juices. Low-sodium or reduced-sodium tomato sauce and paste. Low-sodium or reduced-sodium canned vegetables.  °Fruits °All fresh, canned (in natural juice), or frozen fruits. °Meat and Other Protein Products °Ground beef (85% or leaner), grass-fed beef, or beef trimmed of fat. Skinless chicken or turkey. Ground chicken or turkey. Pork trimmed of fat. All fish and seafood. Eggs. Dried beans, peas, or lentils. Unsalted nuts and seeds. Unsalted canned beans. °Dairy °Low-fat dairy products, such as skim or 1% milk, 2% or reduced-fat cheeses, low-fat ricotta or cottage cheese, or plain low-fat yogurt. Low-sodium or reduced-sodium cheeses. °Fats and Oils °Tub margarines without trans fats. Light or reduced-fat mayonnaise and salad dressings (reduced sodium). Avocado. Safflower, olive, or canola oils. Natural peanut or almond butter. °Other °Unsalted popcorn and pretzels. °The items listed above may not be a complete list of recommended foods or beverages. Contact your dietitian for more options. °WHAT FOODS ARE NOT RECOMMENDED? °Grains °White bread. White pasta. White rice. Refined cornbread. Bagels and croissants. Crackers that contain trans fat. °Vegetables °Creamed or fried vegetables. Vegetables in a cheese sauce. Regular canned vegetables. Regular canned tomato sauce and paste. Regular tomato and vegetable juices. °Fruits °Dried fruits. Canned fruit in light or heavy syrup. Fruit juice. °Meat and Other Protein Products °Fatty cuts of meat. Ribs, chicken wings, bacon, sausage, bologna, salami, chitterlings, fatback, hot dogs, bratwurst, and packaged luncheon meats. Salted nuts and seeds. Canned beans with salt. °Dairy °Whole or 2% milk, cream, half-and-half, and cream cheese. Whole-fat or sweetened yogurt. Full-fat   cheeses or blue cheese. Nondairy creamers and whipped toppings. Processed cheese, cheese spreads, or cheese  curds. °Condiments °Onion and garlic salt, seasoned salt, table salt, and sea salt. Canned and packaged gravies. Worcestershire sauce. Tartar sauce. Barbecue sauce. Teriyaki sauce. Soy sauce, including reduced sodium. Steak sauce. Fish sauce. Oyster sauce. Cocktail sauce. Horseradish. Ketchup and mustard. Meat flavorings and tenderizers. Bouillon cubes. Hot sauce. Tabasco sauce. Marinades. Taco seasonings. Relishes. °Fats and Oils °Butter, stick margarine, lard, shortening, ghee, and bacon fat. Coconut, palm kernel, or palm oils. Regular salad dressings. °Other °Pickles and olives. Salted popcorn and pretzels. °The items listed above may not be a complete list of foods and beverages to avoid. Contact your dietitian for more information. °WHERE CAN I FIND MORE INFORMATION? °[employer], [employer], [employer]: www.nhlbi.nih.gov/health/health-topics/topics/dash/ °Document Released: 05/26/2011 Document Revised: 10/21/2013 Document Reviewed: 04/10/2013 °[employer]® Patient Information ©2015 [employer]. This information is not intended to replace advice given to you by your health care provider. Make sure you discuss any questions you have with your health care provider. ° °

## 2014-09-05 NOTE — Progress Notes (Signed)
Patient complains of bilateral shoulder pain She has had the pain for the last four years but has gotten worse over Time.  Requesting referral to specialist

## 2014-09-05 NOTE — Progress Notes (Signed)
MRN: [id number] Name: Briana Alvarez  Sex: female Age: 54 y.o. DOB: Jan 03, 1961  Allergies: Shrimp  Chief Complaint  Patient presents with  . Shoulder Pain    HPI: Patient is 54 y.o. female who has to of diabetes hypertension today her major concern is on and off left shoulder pain, she denies any recent fall or trauma she also history of neck surgery but as per patient these symptoms are different, patient had a CT scan of neck done in September which reported intact metallic hardware, currently she denies any neck pain, patient is requesting referral to see a specialist, she is also requesting for pain medication.  Past Medical History  Diagnosis Date  . Diabetes mellitus   . Hypertension   . Depression   . Anxiety   . Headache     Past Surgical History  Procedure Laterality Date  . Abdominal hysterectomy    . Carpal tunnel release    . Ulner nerve     . Tonsillectomy    . Neck surgery    . Knee surgery Left       Medication List       This list is accurate as of: 09/05/14  9:55 AM.  Always use your most recent med list.               acetaminophen-codeine 300-30 MG per tablet  Commonly known as:  TYLENOL #3  Take 1 tablet by mouth every 8 (eight) hours as needed for moderate pain.     albuterol 108 (90 BASE) MCG/ACT inhaler  Commonly known as:  PROVENTIL HFA;VENTOLIN HFA  Inhale 2 puffs into the lungs every 6 (six) hours as needed for wheezing or shortness of breath.     ARIPiprazole 10 MG tablet  Commonly known as:  ABILIFY  Take 10 mg by mouth at bedtime.     benazepril 10 MG tablet  Commonly known as:  LOTENSIN  Take 1 tablet (10 mg total) by mouth daily.     DULoxetine 60 MG capsule  Commonly known as:  CYMBALTA  Take 1 capsule (60 mg total) by mouth daily.     EPINEPHrine 0.3 mg/0.3 mL Devi  Commonly known as:  EPIPEN  Inject 0.3 mLs (0.3 mg total) into the muscle once.     gabapentin 300 MG capsule  Commonly known as:  NEURONTIN  Take 3  capsules (900 mg total) by mouth 3 (three) times daily. TAKE 3 CAPSULES BY MOUTH 3 TIMES DAILY     glipiZIDE 5 MG tablet  Commonly known as:  GLUCOTROL  TAKE 1 TABLET BY MOUTH 2 TIMES DAILY BEFORE A MEAL     metFORMIN 1000 MG tablet  Commonly known as:  GLUCOPHAGE  Take 1 tablet (1,000 mg total) by mouth 2 (two) times daily with a meal.     traMADol 50 MG tablet  Commonly known as:  ULTRAM  Take 50 mg by mouth every 6 (six) hours as needed for moderate pain.     traZODone 150 MG tablet  Commonly known as:  DESYREL  Take 150 mg by mouth at bedtime.        Meds ordered this encounter  Medications  . acetaminophen-codeine (TYLENOL #3) 300-30 MG per tablet    Sig: Take 1 tablet by mouth every 8 (eight) hours as needed for moderate pain.    Dispense:  30 tablet    Refill:  0    Immunization History  Administered Date(s) Administered  . Influenza,inj,Quad  PF,36+ Mos 04/14/2014  . Tdap 06/16/2014    Family History  Problem Relation Age of Onset  . Cancer Mother   . Diabetes Daughter   . Diabetes Maternal Grandmother   . Heart attack Neg Hx   . Hypertension Mother   . Hypertension Maternal Grandmother   . Stroke Neg Hx     History  Substance Use Topics  . Smoking status: Never Smoker   . Smokeless tobacco: Not on file  . Alcohol Use: No    Review of Systems   As noted in HPI  Filed Vitals:   09/05/14 0927  BP: 136/93  Pulse: 78  Temp: 98 F (36.7 C)  Resp: 16    Physical Exam  Physical Exam  Constitutional: No distress.  Eyes: EOM are normal. Pupils are equal, round, and reactive to light.  Cardiovascular: Normal rate and regular rhythm.   Pulmonary/Chest: Breath sounds normal. No respiratory distress. She has no wheezes. She has no rales.  Musculoskeletal:  Left shoulder tenderness anteriorly, limitation with full range of motion in abduction, DTR 2+, good range of motion at the elbow and hand joints, 2+ radials pulse.    CBC    Component Value  Date/Time   WBC 6.5 03/18/2014 1100   RBC 4.33 03/18/2014 1100   HGB 14.2 03/18/2014 1100   HCT 41.5 03/18/2014 1100   PLT 130* 03/18/2014 1100   MCV 95.8 03/18/2014 1100   LYMPHSABS 2.6 07/12/2013 1606   MONOABS 0.6 07/12/2013 1606   EOSABS 0.1 07/12/2013 1606   BASOSABS 0.0 07/12/2013 1606    CMP     Component Value Date/Time   NA 138 06/16/2014 0938   K 4.5 06/16/2014 0938   CL 105 06/16/2014 0938   CO2 29 06/16/2014 0938   GLUCOSE 123* 06/16/2014 0938   BUN 13 06/16/2014 0938   CREATININE 1.03 06/16/2014 0938   CREATININE 0.95 03/18/2014 1118   CALCIUM 9.4 06/16/2014 0938   PROT 5.9* 06/16/2014 0938   ALBUMIN 3.9 06/16/2014 0938   AST 20 06/16/2014 0938   ALT 19 06/16/2014 0938   ALKPHOS 118* 06/16/2014 0938   BILITOT 0.4 06/16/2014 0938   GFRNONAA 62 06/16/2014 0938   GFRNONAA 67* 03/18/2014 1118   GFRAA 71 06/16/2014 0938   GFRAA 78* 03/18/2014 1118    Lab Results  Component Value Date/Time   CHOL 131 06/16/2014 09:38 AM    No components found for: HGA1C  Lab Results  Component Value Date/Time   AST 20 06/16/2014 09:38 AM    Assessment and Plan  Chronic left shoulder pain - Plan: acetaminophen-codeine (TYLENOL #3) 300-30 MG per tablet, Ambulatory referral to Orthopedic Surgery  Other specified diabetes mellitus without complications - Plan:  Results for orders placed or performed in visit on 09/05/14  Glucose (CBG)  Result Value Ref Range   POC Glucose 88 70 - 99 mg/dl   Patient will continue with current medications, will repeat A1c in 3 months.   Return in about 3 months (around 12/06/2014) for diabetes.   This note has been created with Surveyor, quantity. Any transcriptional errors are unintentional.    Lorayne Marek, MD

## 2014-09-23 ENCOUNTER — Telehealth: Payer: Self-pay | Admitting: Internal Medicine

## 2014-09-23 ENCOUNTER — Telehealth: Payer: Self-pay

## 2014-09-23 NOTE — Telephone Encounter (Signed)
Patient missed her dermatology appointment yesterday and would like another referral placed so that she may be rescheduled for her appointment; please f/u with patient about status of this request;  Patient also had a separate request for medication Tylenol #3; please f/u

## 2014-09-23 NOTE — Telephone Encounter (Signed)
Patient called stating she lost her prescription for tylenol #3 that was written on 3/18 Patient is requesting a new prescription Can we re write this for her?

## 2014-09-23 NOTE — Telephone Encounter (Signed)
Patient called to request a med refill for acetaminophen-codeine (TYLENOL #3) 300-30 MG per tablet. Please f/u with pt.

## 2014-09-23 NOTE — Telephone Encounter (Signed)
Disregard this note. 

## 2014-09-24 ENCOUNTER — Telehealth: Payer: Self-pay

## 2014-09-24 ENCOUNTER — Telehealth: Payer: Self-pay | Admitting: Internal Medicine

## 2014-09-24 NOTE — Telephone Encounter (Signed)
Patient called requesting a refill on Tylenol #3 Patient stated she lost her prescription and wanting a new one Can i give her another prescription?

## 2014-09-24 NOTE — Telephone Encounter (Signed)
Pt called requesting medication refill for acetaminophen-codeine (TYLENOL #3) 300-30 MG per tablet. Please f/u with pt

## 2014-09-25 ENCOUNTER — Other Ambulatory Visit: Payer: Self-pay

## 2014-09-25 ENCOUNTER — Telehealth: Payer: Self-pay

## 2014-09-25 DIAGNOSIS — G8929 Other chronic pain: Secondary | ICD-10-CM

## 2014-09-25 DIAGNOSIS — M25512 Pain in left shoulder: Principal | ICD-10-CM

## 2014-09-25 MED ORDER — ACETAMINOPHEN-CODEINE #3 300-30 MG PO TABS: 1.0000 | ORAL_TABLET | Freq: Three times a day (TID) | ORAL | Status: DC | PRN

## 2014-09-25 NOTE — Progress Notes (Unsigned)
Patient called requesting a refill on tylenol #3  Per Dr Annitta Needs we can go ahead and  Refill  Prescription is at the front desk

## 2014-09-25 NOTE — Telephone Encounter (Signed)
Patient is aware her prescription is printed and she can pick it up at the front desk

## 2014-09-29 ENCOUNTER — Other Ambulatory Visit (HOSPITAL_COMMUNITY): Payer: Self-pay | Admitting: Orthopedic Surgery

## 2014-09-29 DIAGNOSIS — M25512 Pain in left shoulder: Secondary | ICD-10-CM

## 2014-10-13 ENCOUNTER — Other Ambulatory Visit: Payer: Self-pay

## 2014-10-13 ENCOUNTER — Other Ambulatory Visit: Payer: Self-pay | Admitting: Internal Medicine

## 2014-10-13 MED ORDER — GABAPENTIN 300 MG PO CAPS: 900.0000 mg | ORAL_CAPSULE | Freq: Three times a day (TID) | ORAL | Status: DC

## 2014-10-16 ENCOUNTER — Ambulatory Visit (HOSPITAL_COMMUNITY)
Admission: RE | Admit: 2014-10-16 | Discharge: 2014-10-16 | Disposition: A | Discharge status: Home / Self Care | Payer: No Typology Code available for payment source | Source: Ambulatory Visit | Attending: Orthopedic Surgery | Admitting: Orthopedic Surgery

## 2014-10-16 DIAGNOSIS — M25512 Pain in left shoulder: Secondary | ICD-10-CM | POA: Insufficient documentation

## 2014-10-16 MED ORDER — IOHEXOL 300 MG/ML  SOLN
50.0000 mL | Freq: Once | INTRAMUSCULAR | Status: AC | PRN
  Administered 2014-10-16: 20 mL

## 2014-10-16 MED ORDER — GADOBENATE DIMEGLUMINE 529 MG/ML IV SOLN
5.0000 mL | Freq: Once | INTRAVENOUS | Status: AC | PRN
  Administered 2014-10-16: 5 mL via INTRAVENOUS

## 2014-10-22 ENCOUNTER — Other Ambulatory Visit (HOSPITAL_COMMUNITY): Payer: Self-pay | Admitting: Orthopedic Surgery

## 2014-10-22 DIAGNOSIS — M542 Cervicalgia: Secondary | ICD-10-CM

## 2014-10-30 ENCOUNTER — Ambulatory Visit (HOSPITAL_COMMUNITY)
Admission: RE | Admit: 2014-10-30 | Discharge: 2014-10-30 | Disposition: A | Discharge status: Home / Self Care | Payer: No Typology Code available for payment source | Source: Ambulatory Visit | Attending: Orthopedic Surgery | Admitting: Orthopedic Surgery

## 2014-10-30 DIAGNOSIS — R202 Paresthesia of skin: Secondary | ICD-10-CM | POA: Insufficient documentation

## 2014-10-30 DIAGNOSIS — Z981 Arthrodesis status: Secondary | ICD-10-CM | POA: Insufficient documentation

## 2014-10-30 DIAGNOSIS — M542 Cervicalgia: Secondary | ICD-10-CM | POA: Insufficient documentation

## 2014-11-19 ENCOUNTER — Ambulatory Visit: Payer: No Typology Code available for payment source | Attending: Internal Medicine

## 2014-11-25 ENCOUNTER — Encounter (HOSPITAL_COMMUNITY): Payer: Self-pay | Admitting: Emergency Medicine

## 2014-11-25 ENCOUNTER — Emergency Department (INDEPENDENT_AMBULATORY_CARE_PROVIDER_SITE_OTHER)
Admission: EM | Admit: 2014-11-25 | Discharge: 2014-11-25 | Disposition: A | Discharge status: Home / Self Care | Payer: No Typology Code available for payment source | Source: Home / Self Care | Attending: Emergency Medicine | Admitting: Emergency Medicine

## 2014-11-25 ENCOUNTER — Emergency Department (INDEPENDENT_AMBULATORY_CARE_PROVIDER_SITE_OTHER): Payer: No Typology Code available for payment source

## 2014-11-25 DIAGNOSIS — M25552 Pain in left hip: Secondary | ICD-10-CM

## 2014-11-25 MED ORDER — HYDROCODONE-ACETAMINOPHEN 5-325 MG PO TABS: 1.0000 | ORAL_TABLET | Freq: Four times a day (QID) | ORAL | Status: DC | PRN

## 2014-11-25 MED ORDER — KETOROLAC TROMETHAMINE 60 MG/2ML IM SOLN
INTRAMUSCULAR | Status: AC
  Filled 2014-11-25: qty 2

## 2014-11-25 MED ORDER — PREDNISONE 50 MG PO TABS: ORAL_TABLET | ORAL | Status: DC

## 2014-11-25 MED ORDER — KETOROLAC TROMETHAMINE 60 MG/2ML IM SOLN
60.0000 mg | Freq: Once | INTRAMUSCULAR | Status: AC
  Administered 2014-11-25: 60 mg via INTRAMUSCULAR

## 2014-11-25 MED ORDER — CYCLOBENZAPRINE HCL 10 MG PO TABS: 10.0000 mg | ORAL_TABLET | Freq: Three times a day (TID) | ORAL | Status: DC | PRN

## 2014-11-25 MED ORDER — NAPROXEN 500 MG PO TABS: 500.0000 mg | ORAL_TABLET | Freq: Two times a day (BID) | ORAL | Status: DC

## 2014-11-25 NOTE — ED Provider Notes (Addendum)
CSN: [id number]     Arrival date & time 11/25/14  1300 History   First MD Initiated Contact with Patient 11/25/14 1313     Chief Complaint  Patient presents with  . Fall   (Consider location/radiation/quality/duration/timing/severity/associated sxs/prior Treatment) HPI  She is a 54 year old woman here for evaluation of left hip pain. She states she fell on Saturday. She reports tripping over a child and some shoes, and landing on her left hip. She is been trying to "walk it out." The pain has gradually been getting worse. She reports pain with walking, sitting, standing. She has tried taking Tylenol with codeine without improvement. The pain is starting to move into her left lower back. She denies any radicular pain. No numbness, tingling, weakness in the leg. No bowel or bladder incontinence.  Past Medical History  Diagnosis Date  . Diabetes mellitus   . Hypertension   . Depression   . Anxiety   . Headache    Past Surgical History  Procedure Laterality Date  . Abdominal hysterectomy    . Carpal tunnel release    . Ulner nerve     . Tonsillectomy    . Neck surgery    . Knee surgery Left    Family History  Problem Relation Age of Onset  . Cancer Mother   . Diabetes Daughter   . Diabetes Maternal Grandmother   . Heart attack Neg Hx   . Hypertension Mother   . Hypertension Maternal Grandmother   . Stroke Neg Hx    History  Substance Use Topics  . Smoking status: Never Smoker   . Smokeless tobacco: Not on file  . Alcohol Use: No   OB History    No data available     Review of Systems As in history of present illness Allergies  Shrimp  Home Medications   Prior to Admission medications   Medication Sig Start Date End Date Taking? Authorizing Provider  acetaminophen-codeine (TYLENOL #3) 300-30 MG per tablet Take 1 tablet by mouth every 8 (eight) hours as needed for moderate pain. 09/25/14   Lorayne Marek, MD  albuterol (PROVENTIL HFA;VENTOLIN HFA) 108 (90 BASE) MCG/ACT  inhaler Inhale 2 puffs into the lungs every 6 (six) hours as needed for wheezing or shortness of breath. 05/09/14   Gregor Hams, MD  ARIPiprazole (ABILIFY) 10 MG tablet Take 10 mg by mouth at bedtime.    Historical Provider, MD  benazepril (LOTENSIN) 10 MG tablet Take 1 tablet (10 mg total) by mouth daily. 07/25/14   Jettie Booze, MD  cyclobenzaprine (FLEXERIL) 10 MG tablet Take 1 tablet (10 mg total) by mouth 3 (three) times daily as needed for muscle spasms. 11/25/14   Melony Overly, MD  DULoxetine (CYMBALTA) 60 MG capsule Take 1 capsule (60 mg total) by mouth daily. 08/22/14   Lorayne Marek, MD  EPINEPHrine (EPIPEN) 0.3 mg/0.3 mL DEVI Inject 0.3 mLs (0.3 mg total) into the muscle once. 06/28/12   Ernst Spell, MD  gabapentin (NEURONTIN) 300 MG capsule Take 3 capsules (900 mg total) by mouth 3 (three) times daily. TAKE 3 CAPSULES BY MOUTH 3 TIMES DAILY 10/13/14   Lorayne Marek, MD  glipiZIDE (GLUCOTROL) 5 MG tablet TAKE 1 TABLET BY MOUTH 2 TIMES DAILY BEFORE A MEAL 08/12/14   Lorayne Marek, MD  HYDROcodone-acetaminophen (NORCO) 5-325 MG per tablet Take 1 tablet by mouth every 6 (six) hours as needed for moderate pain. 11/25/14   Melony Overly, MD  metFORMIN (GLUCOPHAGE) 1000 MG  tablet Take 1 tablet (1,000 mg total) by mouth 2 (two) times daily with a meal. 08/12/14   Lorayne Marek, MD  naproxen (NAPROSYN) 500 MG tablet Take 1 tablet (500 mg total) by mouth 2 (two) times daily. 11/25/14   Melony Overly, MD  predniSONE (DELTASONE) 50 MG tablet Take 1 pill daily for 5 days. 11/25/14   Melony Overly, MD  traMADol (ULTRAM) 50 MG tablet Take 50 mg by mouth every 6 (six) hours as needed for moderate pain. 02/18/14   John C Pick-Jacobs, DO  traZODone (DESYREL) 150 MG tablet Take 150 mg by mouth at bedtime.    Historical Provider, MD   BP 129/90 mmHg  Pulse 98  Temp(Src) 98.1 F (36.7 C) (Oral)  Resp 16  SpO2 98% Physical Exam  Constitutional: She is oriented to person, place, and time. She appears well-developed  and well-nourished. She appears distressed (looks uncomfortable).  Neck: Neck supple.  Pulmonary/Chest: Effort normal.  Musculoskeletal:  Back: No erythema or edema. No vertebral tenderness or step-offs. She is tender in the left lower back and buttock. Left hip: She is tender over the greater trochanter. She also has pain with internal and external rotation of the hip.  Neurological: She is alert and oriented to person, place, and time.    ED Course  Procedures (including critical care time) Labs Review Labs Reviewed - No data to display  Imaging Review Dg Hip Unilat With Pelvis 2-3 Views Left  11/25/2014   CLINICAL DATA:  Tripped and fall on Saturday with pain in the left hip. Initial encounter.  EXAM: LEFT HIP (WITH PELVIS) 2-3 VIEWS  COMPARISON:  None.  FINDINGS: Bone detail diminished by soft tissue attenuation from patient body habitus.  No fracture or dislocation. No joint narrowing or evidence of osteonecrosis.  IMPRESSION: Negative.   Electronically Signed   By: Monte Fantasia M.D.   On: 11/25/2014 13:47     MDM   1. Left hip pain    No fracture on x-ray. Likely muscular and ligamentous injury. Treat with ice/heat, relative rest, prednisone, Naprosyn, Flexeril. Prescription for Norco given to use for severe pain. Follow-up with orthopedic doctor if no improvement in 1-2 weeks.  Toradol 60mg  IM given.  Melony Overly, MD 11/25/14 [place] Dorina Ribaudo, MD 01/03/15 [phone]

## 2014-11-25 NOTE — Discharge Instructions (Signed)
Your x-ray is normal. Take prednisone 1 pill daily for the next 5 days. Take Naprosyn 1 pill twice a day for 1 week, starting tomorrow. Use the Flexeril up to 3 times a day as needed for muscle pain. This medicine will make you sleepy. Take Norco every 4-6 hours as needed for severe pain. Do not drive while taking this medicine. Rest your leg as much as you can. It is okay to walk around, but take it easy. Alternate ice and heat to the sore areas 3 times a day. If there is no improvement in 1-2 weeks, please follow-up with your orthopedic doctor.

## 2014-11-25 NOTE — ED Notes (Signed)
C/o falling due to trying not to fall over a child States left hip hurts States she has a lower back pain due to taking off pressure of the hip

## 2014-12-11 ENCOUNTER — Other Ambulatory Visit: Payer: Self-pay | Admitting: Internal Medicine

## 2014-12-16 ENCOUNTER — Telehealth: Payer: Self-pay

## 2014-12-16 ENCOUNTER — Telehealth: Payer: Self-pay | Admitting: Internal Medicine

## 2014-12-16 DIAGNOSIS — M25512 Pain in left shoulder: Secondary | ICD-10-CM

## 2014-12-16 DIAGNOSIS — E139 Other specified diabetes mellitus without complications: Secondary | ICD-10-CM

## 2014-12-16 DIAGNOSIS — G8929 Other chronic pain: Secondary | ICD-10-CM

## 2014-12-16 MED ORDER — BENAZEPRIL HCL 10 MG PO TABS: 10.0000 mg | ORAL_TABLET | Freq: Every day | ORAL | Status: DC

## 2014-12-16 MED ORDER — METFORMIN HCL 1000 MG PO TABS: 1000.0000 mg | ORAL_TABLET | Freq: Two times a day (BID) | ORAL | Status: DC

## 2014-12-16 MED ORDER — DULOXETINE HCL 60 MG PO CPEP: 60.0000 mg | ORAL_CAPSULE | Freq: Every day | ORAL | Status: DC

## 2014-12-16 MED ORDER — ACETAMINOPHEN-CODEINE #3 300-30 MG PO TABS: 1.0000 | ORAL_TABLET | Freq: Three times a day (TID) | ORAL | Status: DC | PRN

## 2014-12-16 MED ORDER — GLIPIZIDE 5 MG PO TABS: ORAL_TABLET | ORAL | Status: DC

## 2014-12-16 MED ORDER — GABAPENTIN 300 MG PO CAPS: 900.0000 mg | ORAL_CAPSULE | Freq: Three times a day (TID) | ORAL | Status: DC

## 2014-12-16 NOTE — Telephone Encounter (Signed)
Patient called requesting refills on her medications [facility] Tylenol #3 printed and dropped at pharmacy

## 2014-12-16 NOTE — Telephone Encounter (Signed)
Patient called requesting medication refill on glipiZIDE (GLUCOTROL) 5 MG tablet,metFORMIN (GLUCOPHAGE) 1000 MG tablet,benazepril (LOTENSIN) 10 MG tablet,acetaminophen-codeine (TYLENOL #3) 300-30 MG per tablet Please f/u with patient

## 2014-12-18 ENCOUNTER — Other Ambulatory Visit (HOSPITAL_COMMUNITY): Payer: Self-pay | Admitting: Specialist

## 2014-12-19 ENCOUNTER — Other Ambulatory Visit (HOSPITAL_COMMUNITY): Payer: Self-pay | Admitting: Specialist

## 2014-12-19 ENCOUNTER — Other Ambulatory Visit (HOSPITAL_COMMUNITY): Payer: Self-pay | Admitting: Surgery

## 2014-12-19 DIAGNOSIS — M25511 Pain in right shoulder: Secondary | ICD-10-CM

## 2014-12-19 DIAGNOSIS — M25512 Pain in left shoulder: Secondary | ICD-10-CM

## 2014-12-26 ENCOUNTER — Emergency Department (HOSPITAL_COMMUNITY)
Admission: EM | Admit: 2014-12-26 | Discharge: 2014-12-27 | Disposition: A | Discharge status: Home / Self Care | Payer: No Typology Code available for payment source | Attending: Emergency Medicine | Admitting: Emergency Medicine

## 2014-12-26 ENCOUNTER — Emergency Department (HOSPITAL_COMMUNITY): Payer: No Typology Code available for payment source

## 2014-12-26 ENCOUNTER — Encounter (HOSPITAL_COMMUNITY): Payer: Self-pay | Admitting: Emergency Medicine

## 2014-12-26 DIAGNOSIS — S40012A Contusion of left shoulder, initial encounter: Secondary | ICD-10-CM

## 2014-12-26 DIAGNOSIS — W19XXXA Unspecified fall, initial encounter: Secondary | ICD-10-CM

## 2014-12-26 DIAGNOSIS — Z79899 Other long term (current) drug therapy: Secondary | ICD-10-CM | POA: Insufficient documentation

## 2014-12-26 DIAGNOSIS — Y9289 Other specified places as the place of occurrence of the external cause: Secondary | ICD-10-CM | POA: Insufficient documentation

## 2014-12-26 DIAGNOSIS — R42 Dizziness and giddiness: Secondary | ICD-10-CM | POA: Insufficient documentation

## 2014-12-26 DIAGNOSIS — Y998 Other external cause status: Secondary | ICD-10-CM | POA: Insufficient documentation

## 2014-12-26 DIAGNOSIS — Y9389 Activity, other specified: Secondary | ICD-10-CM | POA: Insufficient documentation

## 2014-12-26 DIAGNOSIS — W1839XA Other fall on same level, initial encounter: Secondary | ICD-10-CM | POA: Insufficient documentation

## 2014-12-26 DIAGNOSIS — I10 Essential (primary) hypertension: Secondary | ICD-10-CM | POA: Insufficient documentation

## 2014-12-26 DIAGNOSIS — E119 Type 2 diabetes mellitus without complications: Secondary | ICD-10-CM | POA: Insufficient documentation

## 2014-12-26 DIAGNOSIS — S199XXA Unspecified injury of neck, initial encounter: Secondary | ICD-10-CM | POA: Insufficient documentation

## 2014-12-26 LAB — BASIC METABOLIC PANEL
Anion gap: 10 (ref 5–15)
BUN: 16 mg/dL (ref 6–20)
CALCIUM: 9.2 mg/dL (ref 8.9–10.3)
CO2: 24 mmol/L (ref 22–32)
Chloride: 104 mmol/L (ref 101–111)
Creatinine, Ser: 1.4 mg/dL — ABNORMAL HIGH (ref 0.44–1.00)
GFR calc Af Amer: 48 mL/min — ABNORMAL LOW (ref >60)
GFR, EST NON AFRICAN AMERICAN: 42 mL/min — AB (ref >60)
GLUCOSE: 145 mg/dL — AB (ref 65–99)
Potassium: 4 mmol/L (ref 3.5–5.1)
Sodium: 138 mmol/L (ref 135–145)

## 2014-12-26 LAB — CBC
HEMATOCRIT: 41.1 % (ref 36.0–46.0)
Hemoglobin: 13.3 g/dL (ref 12.0–15.0)
MCH: 32.7 pg (ref 26.0–34.0)
MCHC: 32.4 g/dL (ref 30.0–36.0)
MCV: 101 fL — AB (ref 78.0–100.0)
Platelets: 154 10*3/uL (ref 150–400)
RBC: 4.07 MIL/uL (ref 3.87–5.11)
RDW: 15.3 % (ref 11.5–15.5)
WBC: 6 10*3/uL (ref 4.0–10.5)

## 2014-12-26 LAB — CBG MONITORING, ED: Glucose-Capillary: 158 mg/dL — ABNORMAL HIGH (ref 65–99)

## 2014-12-26 MED ORDER — ACETAMINOPHEN 325 MG PO TABS
650.0000 mg | ORAL_TABLET | Freq: Once | ORAL | Status: AC
  Administered 2014-12-27: 650 mg via ORAL
  Filled 2014-12-26: qty 2

## 2014-12-26 MED ORDER — SODIUM CHLORIDE 0.9 % IV BOLUS (SEPSIS)
1000.0000 mL | INTRAVENOUS | Status: AC
  Administered 2014-12-27: 1000 mL via INTRAVENOUS

## 2014-12-26 MED ORDER — HYDROMORPHONE HCL 1 MG/ML IJ SOLN
1.0000 mg | Freq: Once | INTRAMUSCULAR | Status: AC
  Administered 2014-12-27: 1 mg via INTRAVENOUS
  Filled 2014-12-26: qty 1

## 2014-12-26 NOTE — ED Provider Notes (Signed)
CSN: [id number]     Arrival date & time 12/26/14  2045 History   First MD Initiated Contact with Patient 12/26/14 2231     Chief Complaint  Patient presents with  . Fall  . Shoulder Pain  . Dizziness     (Consider location/radiation/quality/duration/timing/severity/associated sxs/prior Treatment) Patient is a 54 y.o. female presenting with fall, shoulder pain, and dizziness. The history is provided by the patient.  Fall This is a new problem. The current episode started 6 to 12 hours ago. Episode frequency: once. The problem has been resolved. Pertinent negatives include no chest pain, no abdominal pain, no headaches and no shortness of breath. Nothing aggravates the symptoms. Nothing relieves the symptoms. She has tried acetaminophen for the symptoms. The treatment provided mild relief.  Shoulder Pain Associated symptoms: no back pain, no fatigue, no fever and no neck pain   Dizziness Associated symptoms: no chest pain, no diarrhea, no headaches, no nausea, no shortness of breath and no vomiting     Past Medical History  Diagnosis Date  . Diabetes mellitus   . Hypertension   . Depression   . Anxiety   . Headache    Past Surgical History  Procedure Laterality Date  . Abdominal hysterectomy    . Carpal tunnel release    . Ulner nerve     . Tonsillectomy    . Neck surgery    . Knee surgery Left    Family History  Problem Relation Age of Onset  . Cancer Mother   . Diabetes Daughter   . Diabetes Maternal Grandmother   . Heart attack Neg Hx   . Hypertension Mother   . Hypertension Maternal Grandmother   . Stroke Neg Hx    History  Substance Use Topics  . Smoking status: Never Smoker   . Smokeless tobacco: Not on file  . Alcohol Use: No   OB History    No data available     Review of Systems  Constitutional: Negative for fever and fatigue.  HENT: Negative for congestion and drooling.   Eyes: Negative for pain.  Respiratory: Negative for cough and shortness of  breath.   Cardiovascular: Negative for chest pain.  Gastrointestinal: Negative for nausea, vomiting, abdominal pain and diarrhea.  Genitourinary: Negative for dysuria and hematuria.  Musculoskeletal: Negative for back pain, gait problem and neck pain.  Skin: Negative for color change.  Neurological: Positive for dizziness. Negative for headaches.  Hematological: Negative for adenopathy.  Psychiatric/Behavioral: Negative for behavioral problems.  All other systems reviewed and are negative.     Allergies  Shrimp  Home Medications   Prior to Admission medications   Medication Sig Start Date End Date Taking? Authorizing Provider  acetaminophen-codeine (TYLENOL #3) 300-30 MG per tablet Take 1 tablet by mouth every 8 (eight) hours as needed for moderate pain. 12/16/14   Tresa Garter, MD  albuterol (PROVENTIL HFA;VENTOLIN HFA) 108 (90 BASE) MCG/ACT inhaler Inhale 2 puffs into the lungs every 6 (six) hours as needed for wheezing or shortness of breath. 05/09/14   Gregor Hams, MD  ARIPiprazole (ABILIFY) 10 MG tablet Take 10 mg by mouth at bedtime.    Historical Provider, MD  benazepril (LOTENSIN) 10 MG tablet Take 1 tablet (10 mg total) by mouth daily. 12/16/14   Tresa Garter, MD  cyclobenzaprine (FLEXERIL) 10 MG tablet Take 1 tablet (10 mg total) by mouth 3 (three) times daily as needed for muscle spasms. 11/25/14   Melony Overly, MD  DULoxetine (CYMBALTA) 60 MG capsule Take 1 capsule (60 mg total) by mouth daily. 12/16/14   Tresa Garter, MD  EPINEPHrine (EPIPEN) 0.3 mg/0.3 mL DEVI Inject 0.3 mLs (0.3 mg total) into the muscle once. 06/28/12   Ernst Spell, MD  gabapentin (NEURONTIN) 300 MG capsule Take 3 capsules (900 mg total) by mouth 3 (three) times daily. TAKE 3 CAPSULES BY MOUTH 3 TIMES DAILY 12/16/14   Tresa Garter, MD  glipiZIDE (GLUCOTROL) 5 MG tablet TAKE 1 TABLET BY MOUTH 2 TIMES DAILY BEFORE A MEAL 12/16/14   Tresa Garter, MD  HYDROcodone-acetaminophen  (NORCO) 5-325 MG per tablet Take 1 tablet by mouth every 6 (six) hours as needed for moderate pain. 11/25/14   Melony Overly, MD  metFORMIN (GLUCOPHAGE) 1000 MG tablet Take 1 tablet (1,000 mg total) by mouth 2 (two) times daily with a meal. 12/16/14   Tresa Garter, MD  naproxen (NAPROSYN) 500 MG tablet Take 1 tablet (500 mg total) by mouth 2 (two) times daily. 11/25/14   Melony Overly, MD  predniSONE (DELTASONE) 50 MG tablet Take 1 pill daily for 5 days. 11/25/14   Melony Overly, MD  traMADol (ULTRAM) 50 MG tablet Take 50 mg by mouth every 6 (six) hours as needed for moderate pain. 02/18/14   John C Pick-Jacobs, DO  traZODone (DESYREL) 150 MG tablet Take 150 mg by mouth at bedtime.    Historical Provider, MD   BP 134/88 mmHg  Pulse 80  Temp(Src) 98.4 F (36.9 C) (Oral)  Resp 16  Ht 5\' 6"  (1.676 m)  Wt 241 lb (109.317 kg)  BMI 38.92 kg/m2  SpO2 98% Physical Exam  Constitutional: She is oriented to person, place, and time. She appears well-developed and well-nourished.  HENT:  Head: Normocephalic.  Mouth/Throat: Oropharynx is clear and moist. No oropharyngeal exudate.  Eyes: Conjunctivae and EOM are normal. Pupils are equal, round, and reactive to light.  Neck: Normal range of motion. Neck supple.  Cardiovascular: Normal rate, regular rhythm, normal heart sounds and intact distal pulses.  Exam reveals no gallop and no friction rub.   No murmur heard. Pulmonary/Chest: Effort normal and breath sounds normal. No respiratory distress. She has no wheezes.  Abdominal: Soft. Bowel sounds are normal. There is no tenderness. There is no rebound and no guarding.  Musculoskeletal: Normal range of motion. She exhibits no edema or tenderness.  Mild lower cervical tenderness to palpation. Otherwise no other vertebral tenderness.  Normal sensation of the left upper extremity. Normal motor skills of the left hand. Limited range of motion of the left shoulder due to pain.  Diffuse tenderness of the left  anterior shoulder and left trapezius area.  Neurological: She is alert and oriented to person, place, and time.  alert, oriented x3 speech: normal in context and clarity memory: intact grossly cranial nerves II-XII: intact motor strength: full proximally and distally no involuntary movements or tremors sensation: intact to light touch diffusely  cerebellar: finger-to-nose and heel-to-shin intact Gait: normal   Skin: Skin is warm and dry.  Psychiatric: She has a normal mood and affect. Her behavior is normal.  Nursing note and vitals reviewed.   ED Course  Procedures (including critical care time) Labs Review Labs Reviewed  BASIC METABOLIC PANEL - Abnormal; Notable for the following:    Glucose, Bld 145 (*)    Creatinine, Ser 1.40 (*)    GFR calc non Af Amer 42 (*)    GFR calc Af Amer 48 (*)  All other components within normal limits  CBC - Abnormal; Notable for the following:    MCV 101.0 (*)    All other components within normal limits  CBG MONITORING, ED - Abnormal; Notable for the following:    Glucose-Capillary 158 (*)    All other components within normal limits  URINALYSIS, ROUTINE W REFLEX MICROSCOPIC (NOT AT [facility])    Imaging Review Ct Head Wo Contrast  12/26/2014   CLINICAL DATA:  Dizziness with fall, striking the head. Neck pain. Initial encounter.  EXAM: CT HEAD WITHOUT CONTRAST  CT CERVICAL SPINE WITHOUT CONTRAST  TECHNIQUE: Multidetector CT imaging of the head and cervical spine was performed following the standard protocol without intravenous contrast. Multiplanar CT image reconstructions of the cervical spine were also generated.  COMPARISON:  Cervical spine MRI 10/30/2014  FINDINGS: CT HEAD FINDINGS  Skull and Sinuses:Negative for fracture or destructive process.  Postinflammatory mucous retention cysts in the inferior maxillary antra.  Orbits: No acute abnormality.  Brain: No evidence of acute infarction, hemorrhage, hydrocephalus, or mass lesion/mass effect.   CT CERVICAL SPINE FINDINGS  C4-5, C5-6, and C6-7 anterior cervical discectomy with ventral plate and screw fixation. Bony fusion is complete and there is no hardware complication. Degenerative changes were evaluated by MRI 2 months ago. No evidence of acute fracture or traumatic malalignment. No gross cervical canal hematoma or prevertebral edema.  IMPRESSION: 1. No evidence of intracranial or cervical spine injury. 2. C4-5 to C6-7 discectomies with completed bony fusion.   Electronically Signed   By: Monte Fantasia M.D.   On: 12/26/2014 22:05   Ct Cervical Spine Wo Contrast  12/26/2014   CLINICAL DATA:  Dizziness with fall, striking the head. Neck pain. Initial encounter.  EXAM: CT HEAD WITHOUT CONTRAST  CT CERVICAL SPINE WITHOUT CONTRAST  TECHNIQUE: Multidetector CT imaging of the head and cervical spine was performed following the standard protocol without intravenous contrast. Multiplanar CT image reconstructions of the cervical spine were also generated.  COMPARISON:  Cervical spine MRI 10/30/2014  FINDINGS: CT HEAD FINDINGS  Skull and Sinuses:Negative for fracture or destructive process.  Postinflammatory mucous retention cysts in the inferior maxillary antra.  Orbits: No acute abnormality.  Brain: No evidence of acute infarction, hemorrhage, hydrocephalus, or mass lesion/mass effect.  CT CERVICAL SPINE FINDINGS  C4-5, C5-6, and C6-7 anterior cervical discectomy with ventral plate and screw fixation. Bony fusion is complete and there is no hardware complication. Degenerative changes were evaluated by MRI 2 months ago. No evidence of acute fracture or traumatic malalignment. No gross cervical canal hematoma or prevertebral edema.  IMPRESSION: 1. No evidence of intracranial or cervical spine injury. 2. C4-5 to C6-7 discectomies with completed bony fusion.   Electronically Signed   By: Monte Fantasia M.D.   On: 12/26/2014 22:05   Dg Shoulder Left  12/26/2014   CLINICAL DATA:  Fall onto left shoulder. Left  shoulder pain and decreased range of motion. Initial encounter.  EXAM: LEFT SHOULDER - 2+ VIEW  COMPARISON:  None.  FINDINGS: There is no evidence of fracture or dislocation. There is no evidence of arthropathy or other focal bone abnormality. Soft tissues are unremarkable.  IMPRESSION: Negative.   Electronically Signed   By: Earle Gell M.D.   On: 12/26/2014 21:41     EKG Interpretation   Date/Time:  Friday December 26 2014 22:53:29 EDT Ventricular Rate:  69 PR Interval:  131 QRS Duration: 74 QT Interval:  392 QTC Calculation: 420 R Axis:   -8 Text  Interpretation:  Sinus rhythm Low voltage, precordial leads Consider  anterior infarct Minimal ST elevation, inferior leads ed] Confirmed by  TEST, Record ([id number]) on 12/27/2014 9:01:33 AM      MDM   Final diagnoses:  Fall, initial encounter  Dizziness  Shoulder contusion, left, initial encounter    11:26 PM 54 y.o. female w hx of DM, HTN who presents after a fall around 4:30 PM today. She states that she had gotten up from a nap to answer the door and began feeling dizzy. She fell to the side hitting her left shoulder and her head. She does not think she had complete loss of consciousness. She currently complains of left shoulder pain and headache. Screening imaging lab work performed prior to my evaluation or noncontributory. She notes that she has had some intermittent dizziness with standing for the last month. Will get a urinalysis as well and pain control.   I interpreted/reviewed the labs and/or imaging which were non-contributory.  No GU sx. Doubt UTI, but will send cx. Pt ambulatory. Will place in sling for comfort. Frozen shoulder precautions given.  I have discussed the diagnosis/risks/treatment options with the patient and believe the pt to be eligible for discharge home to follow-up with her pcp as needed. We also discussed returning to the ED immediately if new or worsening sx occur. We discussed the sx which are most concerning (e.g.,  recurrent falls, worsening HA, fever, worsening dizziness) that necessitate immediate return. Medications administered to the patient during their visit and any new prescriptions provided to the patient are listed below.  Medications given during this visit Medications  HYDROmorphone (DILAUDID) injection 1 mg (1 mg Intravenous Given 12/27/14 0023)  sodium chloride 0.9 % bolus 1,000 mL (0 mLs Intravenous Stopped 12/27/14 0254)  acetaminophen (TYLENOL) tablet 650 mg (650 mg Oral Given 12/27/14 0022)    Discharge Medication List as of 12/27/2014  1:48 AM    START taking these medications   Details  !! HYDROcodone-acetaminophen (NORCO) 5-325 MG per tablet Take 1-2 tablets by mouth every 6 (six) hours as needed for moderate pain., Starting 12/27/2014, Until Discontinued, Print     !! - Potential duplicate medications found. Please discuss with provider.       Pamella Pert, MD 12/27/14 1455

## 2014-12-26 NOTE — ED Notes (Signed)
Pt  C/o pain to left shoulder after falling earlier today

## 2014-12-26 NOTE — ED Notes (Signed)
Patient arrives with complaint of left shoulder and neck pain. States that today she stood up suddenly from nap to answer doorbell. Upon getting to door she was overcome with dizziness and fell to the left striking head on wall and landing on left shoulder. Now states that she cannot move the arm and states pain extends from left neck to upper arm. Additionally reports that dizziness has been persistent for 1 month with standing suddenly. Has not seen PCP for this complaint.

## 2014-12-27 LAB — URINE MICROSCOPIC-ADD ON

## 2014-12-27 LAB — URINALYSIS, ROUTINE W REFLEX MICROSCOPIC
Glucose, UA: 100 mg/dL — AB
Hgb urine dipstick: NEGATIVE
Ketones, ur: 15 mg/dL — AB
NITRITE: NEGATIVE
Protein, ur: 30 mg/dL — AB
SPECIFIC GRAVITY, URINE: 1.042 — AB (ref 1.005–1.030)
Urobilinogen, UA: 1 mg/dL (ref 0.0–1.0)
pH: 5 (ref 5.0–8.0)

## 2014-12-27 MED ORDER — HYDROCODONE-ACETAMINOPHEN 5-325 MG PO TABS: 1.0000 | ORAL_TABLET | Freq: Four times a day (QID) | ORAL | Status: DC | PRN

## 2014-12-27 NOTE — Discharge Instructions (Signed)
Contusion °A contusion is a deep bruise. Contusions are the result of an injury that caused bleeding under the skin. The contusion may turn blue, purple, or yellow. Minor injuries will give you a painless contusion, but more severe contusions may stay painful and swollen for a few weeks.  °CAUSES  °A contusion is usually caused by a blow, trauma, or direct force to an area of the body. °SYMPTOMS  °· Swelling and redness of the injured area. °· Bruising of the injured area. °· Tenderness and soreness of the injured area. °· Pain. °DIAGNOSIS  °The diagnosis can be made by taking a history and physical exam. An X-ray, CT scan, or MRI may be needed to determine if there were any associated injuries, such as fractures. °TREATMENT  °Specific treatment will depend on what area of the body was injured. In general, the best treatment for a contusion is resting, icing, elevating, and applying cold compresses to the injured area. Over-the-counter medicines may also be recommended for pain control. Ask your caregiver what the best treatment is for your contusion. °HOME CARE INSTRUCTIONS  °· Put ice on the injured area. °¨ Put ice in a plastic bag. °¨ Place a towel between your skin and the bag. °¨ Leave the ice on for 15-20 minutes, 3-4 times a day, or as directed by your health care provider. °· Only take over-the-counter or prescription medicines for pain, discomfort, or fever as directed by your caregiver. Your caregiver may recommend avoiding anti-inflammatory medicines (aspirin, ibuprofen, and naproxen) for 48 hours because these medicines may increase bruising. °· Rest the injured area. °· If possible, elevate the injured area to reduce swelling. °SEEK IMMEDIATE MEDICAL CARE IF:  °· You have increased bruising or swelling. °· You have pain that is getting worse. °· Your swelling or pain is not relieved with medicines. °MAKE SURE YOU:  °· Understand these instructions. °· Will watch your condition. °· Will get help right  away if you are not doing well or get worse. °Document Released: 03/16/2005 Document Revised: 06/11/2013 Document Reviewed: 04/11/2011 °[employer]® Patient Information ©2015 [employer]. This information is not intended to replace advice given to you by your health care provider. Make sure you discuss any questions you have with your health care provider. ° °

## 2014-12-28 LAB — URINE CULTURE: Special Requests: NORMAL

## 2014-12-31 ENCOUNTER — Other Ambulatory Visit: Payer: Self-pay

## 2014-12-31 ENCOUNTER — Ambulatory Visit (HOSPITAL_COMMUNITY)
Admission: RE | Admit: 2014-12-31 | Discharge: 2014-12-31 | Disposition: A | Discharge status: Home / Self Care | Payer: No Typology Code available for payment source | Source: Ambulatory Visit | Attending: Surgery | Admitting: Surgery

## 2014-12-31 DIAGNOSIS — R937 Abnormal findings on diagnostic imaging of other parts of musculoskeletal system: Secondary | ICD-10-CM | POA: Insufficient documentation

## 2014-12-31 DIAGNOSIS — M7552 Bursitis of left shoulder: Secondary | ICD-10-CM | POA: Insufficient documentation

## 2014-12-31 DIAGNOSIS — M25511 Pain in right shoulder: Secondary | ICD-10-CM

## 2014-12-31 DIAGNOSIS — M65811 Other synovitis and tenosynovitis, right shoulder: Secondary | ICD-10-CM | POA: Insufficient documentation

## 2014-12-31 DIAGNOSIS — M129 Arthropathy, unspecified: Secondary | ICD-10-CM | POA: Insufficient documentation

## 2014-12-31 DIAGNOSIS — M25512 Pain in left shoulder: Secondary | ICD-10-CM

## 2014-12-31 DIAGNOSIS — M7591 Shoulder lesion, unspecified, right shoulder: Secondary | ICD-10-CM | POA: Insufficient documentation

## 2014-12-31 DIAGNOSIS — M7551 Bursitis of right shoulder: Secondary | ICD-10-CM | POA: Insufficient documentation

## 2014-12-31 MED ORDER — DULOXETINE HCL 60 MG PO CPEP: 60.0000 mg | ORAL_CAPSULE | Freq: Every day | ORAL | Status: DC

## 2015-01-06 ENCOUNTER — Ambulatory Visit: Payer: No Typology Code available for payment source | Attending: Internal Medicine | Admitting: Internal Medicine

## 2015-01-06 ENCOUNTER — Encounter: Payer: Self-pay | Admitting: Internal Medicine

## 2015-01-06 VITALS — BP 155/95 | HR 76 | Temp 98.0°F | Resp 15 | Wt 236.0 lb

## 2015-01-06 DIAGNOSIS — Z7952 Long term (current) use of systemic steroids: Secondary | ICD-10-CM | POA: Insufficient documentation

## 2015-01-06 DIAGNOSIS — R42 Dizziness and giddiness: Secondary | ICD-10-CM

## 2015-01-06 DIAGNOSIS — F419 Anxiety disorder, unspecified: Secondary | ICD-10-CM | POA: Insufficient documentation

## 2015-01-06 DIAGNOSIS — E139 Other specified diabetes mellitus without complications: Secondary | ICD-10-CM

## 2015-01-06 DIAGNOSIS — E119 Type 2 diabetes mellitus without complications: Secondary | ICD-10-CM | POA: Insufficient documentation

## 2015-01-06 DIAGNOSIS — I10 Essential (primary) hypertension: Secondary | ICD-10-CM

## 2015-01-06 DIAGNOSIS — R0981 Nasal congestion: Secondary | ICD-10-CM | POA: Insufficient documentation

## 2015-01-06 DIAGNOSIS — Z79899 Other long term (current) drug therapy: Secondary | ICD-10-CM | POA: Insufficient documentation

## 2015-01-06 DIAGNOSIS — F329 Major depressive disorder, single episode, unspecified: Secondary | ICD-10-CM | POA: Insufficient documentation

## 2015-01-06 DIAGNOSIS — J069 Acute upper respiratory infection, unspecified: Secondary | ICD-10-CM

## 2015-01-06 LAB — POCT GLYCOSYLATED HEMOGLOBIN (HGB A1C): HEMOGLOBIN A1C: 5.9

## 2015-01-06 LAB — GLUCOSE, POCT (MANUAL RESULT ENTRY): POC Glucose: 265 mg/dl — AB (ref 70–99)

## 2015-01-06 MED ORDER — FREESTYLE SYSTEM KIT: 1.0000 | PACK | Status: DC | PRN

## 2015-01-06 MED ORDER — AZITHROMYCIN 250 MG PO TABS: ORAL_TABLET | ORAL | Status: DC

## 2015-01-06 NOTE — Progress Notes (Signed)
MRN: [id number] Name: Briana Alvarez  Sex: female Age: 54 y.o. DOB: [birth year]  Allergies: Shrimp  Chief Complaint  Patient presents with  . Follow-up    HPI: Patient is 54 y.o. female who Patient has history of diabetes hypertension, recently went to the emergency room after having dizzy spell and fall on her left shoulder he had a contusion, as per patient is currently following up with orthopedics and had MRI done, currently she denies any dizziness her blood pressure is borderline elevated, for diabetes she has been taking metformin as well as Glucotrol for diabetes as per patient she is not checking her blood sugar at home, today noticed her hemoglobin A1c is 5.9% ? Dizziness may be related to her hypoglycemic episodes. Patient denies any chest pain or shortness of breath, she is complaining of some URI symptoms stuffy nose sore throat.  Past Medical History  Diagnosis Date  . Diabetes mellitus   . Hypertension   . Depression   . Anxiety   . Headache     Past Surgical History  Procedure Laterality Date  . Abdominal hysterectomy    . Carpal tunnel release    . Ulner nerve     . Tonsillectomy    . Neck surgery    . Knee surgery Left       Medication List       This list is accurate as of: 01/06/15 11:13 AM.  Always use your most recent med list.               acetaminophen-codeine 300-30 MG per tablet  Commonly known as:  TYLENOL #3  Take 1 tablet by mouth every 8 (eight) hours as needed for moderate pain.     albuterol 108 (90 BASE) MCG/ACT inhaler  Commonly known as:  PROVENTIL HFA;VENTOLIN HFA  Inhale 2 puffs into the lungs every 6 (six) hours as needed for wheezing or shortness of breath.     ARIPiprazole 10 MG tablet  Commonly known as:  ABILIFY  Take 10 mg by mouth at bedtime.     azithromycin 250 MG tablet  Commonly known as:  ZITHROMAX Z-PAK  Take as directed     benazepril 10 MG tablet  Commonly known as:  LOTENSIN  Take 1 tablet (10 mg  total) by mouth daily.     cyclobenzaprine 10 MG tablet  Commonly known as:  FLEXERIL  Take 1 tablet (10 mg total) by mouth 3 (three) times daily as needed for muscle spasms.     DULoxetine 60 MG capsule  Commonly known as:  CYMBALTA  Take 1 capsule (60 mg total) by mouth daily.     EPINEPHrine 0.3 mg/0.3 mL Devi  Commonly known as:  EPIPEN  Inject 0.3 mLs (0.3 mg total) into the muscle once.     gabapentin 300 MG capsule  Commonly known as:  NEURONTIN  Take 3 capsules (900 mg total) by mouth 3 (three) times daily. TAKE 3 CAPSULES BY MOUTH 3 TIMES DAILY     glipiZIDE 5 MG tablet  Commonly known as:  GLUCOTROL  TAKE 1 TABLET BY MOUTH 2 TIMES DAILY BEFORE A MEAL     glucose monitoring kit monitoring kit  1 each by Does not apply route as needed for other. Dispense any model that is covered- dispense testing supplies for Q AC/ HS accuchecks- 1 month supply with one refil.     HYDROcodone-acetaminophen 5-325 MG per tablet  Commonly known as:  NORCO  Take 1  tablet by mouth every 6 (six) hours as needed for moderate pain.     HYDROcodone-acetaminophen 5-325 MG per tablet  Commonly known as:  NORCO  Take 1-2 tablets by mouth every 6 (six) hours as needed for moderate pain.     metFORMIN 1000 MG tablet  Commonly known as:  GLUCOPHAGE  Take 1 tablet (1,000 mg total) by mouth 2 (two) times daily with a meal.     naproxen 500 MG tablet  Commonly known as:  NAPROSYN  Take 1 tablet (500 mg total) by mouth 2 (two) times daily.     predniSONE 50 MG tablet  Commonly known as:  DELTASONE  Take 1 pill daily for 5 days.     traMADol 50 MG tablet  Commonly known as:  ULTRAM  Take 50 mg by mouth every 6 (six) hours as needed for moderate pain.     traZODone 150 MG tablet  Commonly known as:  DESYREL  Take 150 mg by mouth at bedtime.        Meds ordered this encounter  Medications  . azithromycin (ZITHROMAX Z-PAK) 250 MG tablet    Sig: Take as directed    Dispense:  6 each     Refill:  0  . glucose monitoring kit (FREESTYLE) monitoring kit    Sig: 1 each by Does not apply route as needed for other. Dispense any model that is covered- dispense testing supplies for Q AC/ HS accuchecks- 1 month supply with one refil.    Dispense:  1 each    Refill:  1    Immunization History  Administered Date(s) Administered  . Influenza,inj,Quad PF,36+ Mos 04/14/2014  . Tdap 06/16/2014    Family History  Problem Relation Age of Onset  . Cancer Mother   . Diabetes Daughter   . Diabetes Maternal Grandmother   . Heart attack Neg Hx   . Hypertension Mother   . Hypertension Maternal Grandmother   . Stroke Neg Hx     History  Substance Use Topics  . Smoking status: Never Smoker   . Smokeless tobacco: Not on file  . Alcohol Use: No    Review of Systems   As noted in HPI  Filed Vitals:   01/06/15 0957  BP: 155/95  Pulse: 76  Temp: 98 F (36.7 C)  Resp: 15    Physical Exam  Physical Exam  HENT:  nasal congestion minimal sinus tenderness, both TMs visualized not congested  Eyes: EOM are normal. Pupils are equal, round, and reactive to light.  Neck: Neck supple.  Cardiovascular: Normal rate and regular rhythm.   Pulmonary/Chest: Breath sounds normal. No respiratory distress. She has no wheezes.  Musculoskeletal: She exhibits no edema.    CBC    Component Value Date/Time   WBC 6.0 12/26/2014 2124   RBC 4.07 12/26/2014 2124   HGB 13.3 12/26/2014 2124   HCT 41.1 12/26/2014 2124   PLT 154 12/26/2014 2124   MCV 101.0* 12/26/2014 2124   LYMPHSABS 2.6 07/12/2013 1606   MONOABS 0.6 07/12/2013 1606   EOSABS 0.1 07/12/2013 1606   BASOSABS 0.0 07/12/2013 1606    CMP     Component Value Date/Time   NA 138 12/26/2014 2124   K 4.0 12/26/2014 2124   CL 104 12/26/2014 2124   CO2 24 12/26/2014 2124   GLUCOSE 145* 12/26/2014 2124   BUN 16 12/26/2014 2124   CREATININE 1.40* 12/26/2014 2124   CREATININE 1.03 06/16/2014 0938   CALCIUM 9.2 12/26/2014 2124  PROT 5.9* 06/16/2014 0938   ALBUMIN 3.9 06/16/2014 0938   AST 20 06/16/2014 0938   ALT 19 06/16/2014 0938   ALKPHOS 118* 06/16/2014 0938   BILITOT 0.4 06/16/2014 0938   GFRNONAA 42* 12/26/2014 2124   GFRNONAA 62 06/16/2014 0938   GFRAA 48* 12/26/2014 2124   GFRAA 71 06/16/2014 0938    Lab Results  Component Value Date/Time   CHOL 131 06/16/2014 09:38 AM    Lab Results  Component Value Date/Time   HGBA1C 5.90 01/06/2015 09:50 AM   HGBA1C 5.8* 04/20/2011 05:47 AM    Lab Results  Component Value Date/Time   AST 20 06/16/2014 09:38 AM    Assessment and Plan  Other specified diabetes mellitus without complications - Plan:  Results for orders placed or performed in visit on 01/06/15  Glucose (CBG)  Result Value Ref Range   POC Glucose 265.0 (A) 70 - 99 mg/dl  HgB A1c  Result Value Ref Range   Hemoglobin A1C 5.90    Patient is given prescription for glucometer,patient is currently on metformin as well as Glucotrol, have advised patient if her blood sugar drops less than 80 mg/dL, she'll reduce the dose of Glucotrol, she did fingerstick log, repeat  HgB A1c in 3 months  Essential hypertension, benign Blood pressure borderline elevated, she wants to try diet modification, she is given handout, currently on benazepril 10 mg daily, reevaluate on the following visit if persistently elevated blood pressure consider  increasing the dose  Dizziness and giddiness Currently patient is not symptomatic, ? May be related to hypoglycemic episodes, patient will monitor blood sugar at home.  URI (upper respiratory infection) Prescribed patient zpak    Return in about 3 months (around 04/08/2015).   This note has been created with Surveyor, quantity. Any transcriptional errors are unintentional.    Lorayne Marek, MD

## 2015-01-06 NOTE — Progress Notes (Signed)
Patient here for follow up on her diabetes and hypertension Patient complains of having some dizzy spells off and on Patient did state her blood pressure has been running a little on the high side lately

## 2015-01-06 NOTE — Patient Instructions (Signed)
Diabetes Mellitus and Food It is important for you to manage your blood sugar (glucose) level. Your blood glucose level can be greatly affected by what you eat. Eating healthier foods in the appropriate amounts throughout the day at about the same time each day will help you control your blood glucose level. It can also help slow or prevent worsening of your diabetes mellitus. Healthy eating may even help you improve the level of your blood pressure and reach or maintain a healthy weight.  HOW CAN FOOD AFFECT ME? Carbohydrates Carbohydrates affect your blood glucose level more than any other type of food. Your dietitian will help you determine how many carbohydrates to eat at each meal and teach you how to count carbohydrates. Counting carbohydrates is important to keep your blood glucose at a healthy level, especially if you are using insulin or taking certain medicines for diabetes mellitus. Alcohol Alcohol can cause sudden decreases in blood glucose (hypoglycemia), especially if you use insulin or take certain medicines for diabetes mellitus. Hypoglycemia can be a life-threatening condition. Symptoms of hypoglycemia (sleepiness, dizziness, and disorientation) are similar to symptoms of having too much alcohol.  If your health care provider has given you approval to drink alcohol, do so in moderation and use the following guidelines:  Women should not have more than one drink per day, and men should not have more than two drinks per day. One drink is equal to:  12 oz of beer.  5 oz of wine.  1 oz of hard liquor.  Do not drink on an empty stomach.  Keep yourself hydrated. Have water, diet soda, or unsweetened iced tea.  Regular soda, juice, and other mixers might contain a lot of carbohydrates and should be counted. WHAT FOODS ARE NOT RECOMMENDED? As you make food choices, it is important to remember that all foods are not the same. Some foods have fewer nutrients per serving than other  foods, even though they might have the same number of calories or carbohydrates. It is difficult to get your body what it needs when you eat foods with fewer nutrients. Examples of foods that you should avoid that are high in calories and carbohydrates but low in nutrients include:  Trans fats (most processed foods list trans fats on the Nutrition Facts label).  Regular soda.  Juice.  Candy.  Sweets, such as cake, pie, doughnuts, and cookies.  Fried foods. WHAT FOODS CAN I EAT? Have nutrient-rich foods, which will nourish your body and keep you healthy. The food you should eat also will depend on several factors, including:  The calories you need.  The medicines you take.  Your weight.  Your blood glucose level.  Your blood pressure level.  Your cholesterol level. You also should eat a variety of foods, including:  Protein, such as meat, poultry, fish, tofu, nuts, and seeds (lean animal proteins are best).  Fruits.  Vegetables.  Dairy products, such as milk, cheese, and yogurt (low fat is best).  Breads, grains, pasta, cereal, rice, and beans.  Fats such as olive oil, trans fat-free margarine, canola oil, avocado, and olives. DOES EVERYONE WITH DIABETES MELLITUS HAVE THE SAME MEAL PLAN? Because every person with diabetes mellitus is different, there is not one meal plan that works for everyone. It is very important that you meet with a dietitian who will help you create a meal plan that is just right for you. Document Released: 03/03/2005 Document Revised: 06/11/2013 Document Reviewed: 05/03/2013 ExitCare Patient Information 2015 [employer]. This   information is not intended to replace advice given to you by your health care provider. Make sure you discuss any questions you have with your health care provider. DASH Eating Plan DASH stands for "Dietary Approaches to Stop Hypertension." The DASH eating plan is a healthy eating plan that has been shown to reduce high  blood pressure (hypertension). Additional health benefits may include reducing the risk of type 2 diabetes mellitus, heart disease, and stroke. The DASH eating plan may also help with weight loss. WHAT DO I NEED TO KNOW ABOUT THE DASH EATING PLAN? For the DASH eating plan, you will follow these general guidelines:  Choose foods with a percent daily value for sodium of less than 5% (as listed on the food label).  Use salt-free seasonings or herbs instead of table salt or sea salt.  Check with your health care provider or pharmacist before using salt substitutes.  Eat lower-sodium products, often labeled as "lower sodium" or "no salt added."  Eat fresh foods.  Eat more vegetables, fruits, and low-fat dairy products.  Choose whole grains. Look for the word "whole" as the first word in the ingredient list.  Choose fish and skinless chicken or turkey more often than red meat. Limit fish, poultry, and meat to 6 oz (170 g) each day.  Limit sweets, desserts, sugars, and sugary drinks.  Choose heart-healthy fats.  Limit cheese to 1 oz (28 g) per day.  Eat more home-cooked food and less restaurant, buffet, and fast food.  Limit fried foods.  Cook foods using methods other than frying.  Limit canned vegetables. If you do use them, rinse them well to decrease the sodium.  When eating at a restaurant, ask that your food be prepared with less salt, or no salt if possible. WHAT FOODS CAN I EAT? Seek help from a dietitian for individual calorie needs. Grains Whole grain or whole wheat bread. Brown rice. Whole grain or whole wheat pasta. Quinoa, bulgur, and whole grain cereals. Low-sodium cereals. Corn or whole wheat flour tortillas. Whole grain cornbread. Whole grain crackers. Low-sodium crackers. Vegetables Fresh or frozen vegetables (raw, steamed, roasted, or grilled). Low-sodium or reduced-sodium tomato and vegetable juices. Low-sodium or reduced-sodium tomato sauce and paste. Low-sodium  or reduced-sodium canned vegetables.  Fruits All fresh, canned (in natural juice), or frozen fruits. Meat and Other Protein Products Ground beef (85% or leaner), grass-fed beef, or beef trimmed of fat. Skinless chicken or turkey. Ground chicken or turkey. Pork trimmed of fat. All fish and seafood. Eggs. Dried beans, peas, or lentils. Unsalted nuts and seeds. Unsalted canned beans. Dairy Low-fat dairy products, such as skim or 1% milk, 2% or reduced-fat cheeses, low-fat ricotta or cottage cheese, or plain low-fat yogurt. Low-sodium or reduced-sodium cheeses. Fats and Oils Tub margarines without trans fats. Light or reduced-fat mayonnaise and salad dressings (reduced sodium). Avocado. Safflower, olive, or canola oils. Natural peanut or almond butter. Other Unsalted popcorn and pretzels. The items listed above may not be a complete list of recommended foods or beverages. Contact your dietitian for more options. WHAT FOODS ARE NOT RECOMMENDED? Grains White bread. White pasta. White rice. Refined cornbread. Bagels and croissants. Crackers that contain trans fat. Vegetables Creamed or fried vegetables. Vegetables in a cheese sauce. Regular canned vegetables. Regular canned tomato sauce and paste. Regular tomato and vegetable juices. Fruits Dried fruits. Canned fruit in light or heavy syrup. Fruit juice. Meat and Other Protein Products Fatty cuts of meat. Ribs, chicken wings, bacon, sausage, bologna, salami, chitterlings, fatback, hot   dogs, bratwurst, and packaged luncheon meats. Salted nuts and seeds. Canned beans with salt. Dairy Whole or 2% milk, cream, half-and-half, and cream cheese. Whole-fat or sweetened yogurt. Full-fat cheeses or blue cheese. Nondairy creamers and whipped toppings. Processed cheese, cheese spreads, or cheese curds. Condiments Onion and garlic salt, seasoned salt, table salt, and sea salt. Canned and packaged gravies. Worcestershire sauce. Tartar sauce. Barbecue sauce.  Teriyaki sauce. Soy sauce, including reduced sodium. Steak sauce. Fish sauce. Oyster sauce. Cocktail sauce. Horseradish. Ketchup and mustard. Meat flavorings and tenderizers. Bouillon cubes. Hot sauce. Tabasco sauce. Marinades. Taco seasonings. Relishes. Fats and Oils Butter, stick margarine, lard, shortening, ghee, and bacon fat. Coconut, palm kernel, or palm oils. Regular salad dressings. Other Pickles and olives. Salted popcorn and pretzels. The items listed above may not be a complete list of foods and beverages to avoid. Contact your dietitian for more information. WHERE CAN I FIND MORE INFORMATION? National Heart, Lung, and Blood Institute: www.nhlbi.nih.gov/health/health-topics/topics/dash/ Document Released: 05/26/2011 Document Revised: 10/21/2013 Document Reviewed: 04/10/2013 ExitCare Patient Information 2015 [employer]. This information is not intended to replace advice given to you by your health care provider. Make sure you discuss any questions you have with your health care provider.  

## 2015-01-08 ENCOUNTER — Other Ambulatory Visit (HOSPITAL_COMMUNITY): Payer: Self-pay | Admitting: Orthopedic Surgery

## 2015-01-13 ENCOUNTER — Ambulatory Visit: Payer: No Typology Code available for payment source | Attending: Internal Medicine

## 2015-01-13 ENCOUNTER — Other Ambulatory Visit: Payer: Self-pay | Admitting: Internal Medicine

## 2015-01-13 ENCOUNTER — Other Ambulatory Visit: Payer: Self-pay

## 2015-01-13 DIAGNOSIS — G8929 Other chronic pain: Secondary | ICD-10-CM

## 2015-01-13 DIAGNOSIS — M25512 Pain in left shoulder: Principal | ICD-10-CM

## 2015-01-13 MED ORDER — ACETAMINOPHEN-CODEINE #3 300-30 MG PO TABS: 1.0000 | ORAL_TABLET | Freq: Three times a day (TID) | ORAL | Status: DC | PRN

## 2015-01-13 NOTE — Progress Notes (Unsigned)
Patient here in office requesting a refill on her tylenol #3 Prescription printed and given to patient

## 2015-01-21 ENCOUNTER — Encounter: Payer: Self-pay | Admitting: Physical Therapy

## 2015-01-21 ENCOUNTER — Ambulatory Visit: Payer: No Typology Code available for payment source | Attending: Specialist | Admitting: Physical Therapy

## 2015-01-21 DIAGNOSIS — W19XXXD Unspecified fall, subsequent encounter: Secondary | ICD-10-CM | POA: Insufficient documentation

## 2015-01-21 DIAGNOSIS — M129 Arthropathy, unspecified: Secondary | ICD-10-CM | POA: Insufficient documentation

## 2015-01-21 DIAGNOSIS — M25611 Stiffness of right shoulder, not elsewhere classified: Secondary | ICD-10-CM

## 2015-01-21 DIAGNOSIS — M19012 Primary osteoarthritis, left shoulder: Secondary | ICD-10-CM

## 2015-01-21 DIAGNOSIS — M25512 Pain in left shoulder: Secondary | ICD-10-CM | POA: Insufficient documentation

## 2015-01-21 DIAGNOSIS — S40012D Contusion of left shoulder, subsequent encounter: Secondary | ICD-10-CM | POA: Insufficient documentation

## 2015-01-21 DIAGNOSIS — S46812D Strain of other muscles, fascia and tendons at shoulder and upper arm level, left arm, subsequent encounter: Secondary | ICD-10-CM | POA: Insufficient documentation

## 2015-01-21 DIAGNOSIS — T07XXXA Unspecified multiple injuries, initial encounter: Secondary | ICD-10-CM

## 2015-01-21 DIAGNOSIS — S40012A Contusion of left shoulder, initial encounter: Secondary | ICD-10-CM

## 2015-01-21 DIAGNOSIS — Z9889 Other specified postprocedural states: Secondary | ICD-10-CM | POA: Insufficient documentation

## 2015-01-21 DIAGNOSIS — M25612 Stiffness of left shoulder, not elsewhere classified: Secondary | ICD-10-CM | POA: Insufficient documentation

## 2015-01-21 NOTE — Patient Instructions (Signed)
Axial Extension (Chin Tuck)   Pull chin in and lengthen back of neck. Hold _5_ seconds while counting out loud. Repeat __5__ times. Throughout the day  http://gt2.exer.us/450   Copyright  VHI. All rights reserved.     Ear / Shoulder Stretch   Exhaling, move left ear toward left shoulder. Hold position for _4__ breaths. Inhaling, bring head back to center. Repeat to other side. Repeat sequence _3__ times/side.Through out the day  Copyright  VHI. All rights reserved.  Retraction: Scapula - Bilateral   Facing pulley, straps around shoulders, pull straps back by pinching shoulder blades together. Repeat 10 times per set. Do 2 sets per session. Throughout the day Copyright  VHI. All rights reserved.

## 2015-01-21 NOTE — Therapy (Addendum)
[place], [place], [address] Phone: [phone]   Fax:  [phone]  Physical Therapy Evaluation  Patient Details  Name: Briana Alvarez MRN: [id number] Date of Birth: 1961/06/04 Referring Provider:  Jessy Oto, MD  Encounter Date: 01/21/2015      PT End of Session - 01/21/15 0852    Visit Number 1   Number of Visits 16   Date for PT Re-Evaluation 03/18/15   PT Start Time 0805   PT Stop Time 0850   PT Time Calculation (min) 45 min   Activity Tolerance Patient tolerated treatment well   Behavior During Therapy [facility] for tasks assessed/performed      Past Medical History  Diagnosis Date  . Diabetes mellitus   . Hypertension   . Depression   . Anxiety   . Headache     Past Surgical History  Procedure Laterality Date  . Abdominal hysterectomy    . Carpal tunnel release    . Ulner nerve     . Tonsillectomy    . Neck surgery    . Knee surgery Left     There were no vitals filed for this visit.  Visit Diagnosis:  Shoulder contusion, left, initial encounter - Plan: PT plan of care cert/re-cert  Pain in joint, shoulder region, left - Plan: PT plan of care cert/re-cert  Arthritis of left shoulder region - Plan: PT plan of care cert/re-cert  Strains of multiple ligaments or muscles - Plan: PT plan of care cert/re-cert  Decreased range of motion of shoulder, left - Plan: PT plan of care cert/re-cert  Decreased range of motion of shoulder, right - Plan: PT plan of care cert/re-cert      Subjective Assessment - 01/21/15 0814    Subjective Pt sustained a fall on 12/26/14 after feeling dizziness and blacking out which resulted in her falling into a wall on her L shoulder. She sustained a contusion with multiple scapular/shoulder muscle strains. Pt. c/o difficulty lifting, carrying items and anything that involves lifting her hands above her head. Pt plans to attend therapy as much as she can prior  to surgery and plans to return following.   Pertinent History fall on 12/26/14, surgery 02/03/15, narrowing in both shoulders, spurs, arthritis   Limitations Lifting;Standing;House hold activities  standing long period with arms handing by herside will result in numbness and tingling down her arms   How long can you sit comfortably? every hour has to stand due to back   How long can you stand comfortably? pt is able to stand long enough to carry on a conversation with someone   Diagnostic tests MRI   Patient Stated Goals pain relief until surgery   Currently in Pain? Other (Comment)  warm showers are helpful for her she also uses ice, however at this time she is not in pain   Multiple Pain Sites No            [facility] PT Assessment - 01/21/15 0815    Assessment   Medical Diagnosis L shoulder contusion   Onset Date/Surgical Date --  fell 12/26/14, surgery 02/03/15   Hand Dominance Right   Next MD Visit pre op on the 8th of this month   Prior Therapy yes for neck   Precautions   Precautions None   Type of Shoulder Precautions  MD told patient not to lift arms overhead if possible   Restrictions   Weight Bearing Restrictions No   Balance Screen  Has the patient fallen in the past 6 months Yes  Initial injury due to fall no assessment needed at this time   How many times? 1  injury   Has the patient had a decrease in activity level because of a fear of falling?  No   Is the patient reluctant to leave their home because of a fear of falling?  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, full time student   Cognition   Overall Cognitive Status Within Functional Limits for tasks assessed   Observation/Other Assessments   Skin Integrity intact   Focus on  Therapeutic Outcomes (FOTO)  NT   Sensation   Light Touch Appears Intact   Posture/Postural Control   Posture/Postural Control Postural limitations   Postural Limitations Rounded Shoulders;Forward head;Decreased lumbar lordosis   ROM / Strength   AROM / PROM / Strength AROM;Strength   AROM   Overall AROM  Deficits   Overall AROM Comments in supine   AROM Assessment Site Shoulder;Cervical   Right/Left Shoulder Right;Left   Right Shoulder Flexion 98 Degrees   Right Shoulder ABduction 50 Degrees   Right Shoulder Internal Rotation 80 Degrees  at 70degrees of abduction   Right Shoulder External Rotation 50 Degrees  at 70 degrees of abduction   Left Shoulder Flexion 68 Degrees   Left Shoulder ABduction 65 Degrees   Left Shoulder Internal Rotation 80 Degrees  @ 70 degrees abduction   Left Shoulder External Rotation 65 Degrees  @ 70 degrees abduction   Cervical Flexion WFL   Cervical Extension WFL   Cervical - Right Side Bend limited 75%   Cervical - Left Side Bend limited 50%   Cervical - Right Rotation limited 50%   Cervical - Left Rotation limited 50%   Strength   Overall Strength Unable to assess;Due to pain   Palpation   Palpation comment bilateral traps hypertrophied, tender to palpation on L top of glenoid along trap           PT Education - 01/21/15 0851    Education provided Yes   Education Details HEP, ionto wear and removal   Person(s) Educated Patient   Methods Explanation;Demonstration;Handout   Comprehension Verbalized understanding;Returned demonstration          PT Short Term Goals - 01/21/15 0921    PT SHORT TERM GOAL #1   Title Pt will be I with initial HEP   Time 2   Period Weeks   Status New   PT SHORT TERM GOAL #2   Title Pt will demonstrate and verbalize understanding of condition management including ice/heat, positioning for comfort   Time 2   Period Weeks   Status New           PT Long Term Goals - 01/21/15 1123    PT LONG TERM  GOAL #1   Title None current, will be set once patient has surgery               Plan - 01/21/15 0853    Clinical Impression Statement Pt. is a 54 year old female, s/p fall onto L shoulder 12/26/14 after having a dizzy spell and blacking out resulting in contusion. Surgery is scheduled for 02/03/15. A MRI was completed following  the fall which showed strain of L trapezius, L deltoid, infrapinatus and degenerative changes at [facility] joint . Pt presents with decreased range of motion, increased pain with movement and palpation, apparent weakness due to pain although not assessed at this time. Pt. is limited in raising her hands above her head, perfoming ADLS such as grooming. She would benefit from skilled PT services to assist with pain management, postural correction, and home care management of her condition prior to surgery.   Pt will benefit from skilled therapeutic intervention in order to improve on the following deficits Decreased range of motion;Dizziness;Impaired UE functional use;Pain;Impaired flexibility;Decreased mobility;Decreased strength;Postural dysfunction   Rehab Potential Fair   Clinical Impairments Affecting Rehab Potential degenerative changes and injured muscles from fall    PT Frequency 2x / week   PT Duration 4 weeks   PT Treatment/Interventions ADLs/Self Care Home Management;Cryotherapy;Moist Heat;Iontophoresis 4mg /ml Dexamethasone;Patient/family education;Passive range of motion;Therapeutic exercise;Taping;Electrical Stimulation;Ultrasound   PT Next Visit Plan PROM, AAROM, ultrasound, neck stretches (levator, traps)   PT Home Exercise Plan postural exercises, and neck stretches   Consulted and Agree with Plan of Care Patient         Problem List Patient Active Problem List   Diagnosis Date Noted  . Lumbar radiculopathy, chronic 02/18/2014  . Hypertriglyceridemia 01/08/2014  . DM (diabetes mellitus) 07/12/2013  . Insomnia 07/12/2013  . Depression 07/12/2013  .  Essential hypertension, benign 07/12/2013    PAA,JENNIFER 01/21/2015, 3:24 PM  [facility] [address] [place], [place], [address] Phone: [phone]   Fax:  [phone]    Raeford Razor, PT 01/21/2015 3:24 PM Phone: [phone] Fax: [phone]

## 2015-01-23 ENCOUNTER — Ambulatory Visit: Payer: No Typology Code available for payment source | Admitting: Physical Therapy

## 2015-01-23 DIAGNOSIS — M25611 Stiffness of right shoulder, not elsewhere classified: Secondary | ICD-10-CM

## 2015-01-23 DIAGNOSIS — S40012A Contusion of left shoulder, initial encounter: Secondary | ICD-10-CM

## 2015-01-23 DIAGNOSIS — T07XXXA Unspecified multiple injuries, initial encounter: Secondary | ICD-10-CM

## 2015-01-23 DIAGNOSIS — M25512 Pain in left shoulder: Secondary | ICD-10-CM

## 2015-01-23 DIAGNOSIS — M19012 Primary osteoarthritis, left shoulder: Secondary | ICD-10-CM

## 2015-01-23 DIAGNOSIS — M25612 Stiffness of left shoulder, not elsewhere classified: Secondary | ICD-10-CM

## 2015-01-23 NOTE — Therapy (Signed)
[place], [place], [address] Phone: [phone]   Fax:  [phone]  Physical Therapy Treatment  Patient Details  Name: Briana Alvarez MRN: [id number] Date of Birth: January 11, 1961 Referring Provider:  Lorayne Marek, MD  Encounter Date: 01/23/2015      PT End of Session - 01/23/15 0845    Visit Number 2   Number of Visits 16   Date for PT Re-Evaluation 03/18/15   PT Start Time 0803   PT Stop Time 0843   PT Time Calculation (min) 40 min   Activity Tolerance Patient tolerated treatment well;No increased pain   Behavior During Therapy [facility] for tasks assessed/performed      Past Medical History  Diagnosis Date  . Diabetes mellitus   . Hypertension   . Depression   . Anxiety   . Headache     Past Surgical History  Procedure Laterality Date  . Abdominal hysterectomy    . Carpal tunnel release    . Ulner nerve     . Tonsillectomy    . Neck surgery    . Knee surgery Left     There were no vitals filed for this visit.  Visit Diagnosis:  Pain in joint, shoulder region, left  Shoulder contusion, left, initial encounter  Arthritis of left shoulder region  Strains of multiple ligaments or muscles  Decreased range of motion of shoulder, left  Decreased range of motion of shoulder, right      Subjective Assessment - 01/23/15 0805    Subjective Pt. states she is doing well today and is having no pain. She states she felt like she was in a little more pain after removing the ionto patch.   Currently in Pain? No/denies   Multiple Pain Sites No                         [facility] Adult PT Treatment/Exercise - 01/23/15 0808    Exercises   Exercises Shoulder   Neck Exercises: Supine   Other Supine Exercise Supine AAROM with pole, shoulder flexion, ER, abduction, chest press 2 setsx10 for all   Shoulder Exercises: Supine   Horizontal ABduction Strengthening;Left;10 reps;Theraband;Other  (comment)  2 sets   Theraband Level (Shoulder Horizontal ABduction) Level 1 (Yellow)   Other Supine Exercises scapular retractions 2 sets x10   Modalities   Modalities Ultrasound   Manual Therapy   Manual Therapy Passive ROM                PT Education - 01/23/15 0844    Education provided Yes   Education Details HEP   Person(s) Educated Patient   Methods Explanation;Handout;Demonstration   Comprehension Verbalized understanding;Returned demonstration          PT Short Term Goals - 01/21/15 0921    PT SHORT TERM GOAL #1   Title Pt will be I with initial HEP   Time 2   Period Weeks   Status New   PT SHORT TERM GOAL #2   Title Pt will demonstrate and verbalize understanding of condition management including ice/heat, positioning for comfort   Time 2   Period Weeks   Status New           PT Long Term Goals - 01/21/15 1123    PT LONG TERM GOAL #1   Title None current, will be set once patient has surgery  Plan - 01/23/15 0846    Clinical Impression Statement Pt. presents without pain in L UE. During treatment [place] had pain near end range of AAROM. We will continue to work towards maintaining her available ROM, regaining more if possible as well as strengthening her shoulder musculature in prep for her upcoming surgery.   PT Next Visit Plan AAROM supine, try in sitting as well, strengthening exercises with yellow t band   PT Home Exercise Plan cane AAROM exercises, scap retractions   Consulted and Agree with Plan of Care Patient        Problem List Patient Active Problem List   Diagnosis Date Noted  . Lumbar radiculopathy, chronic 02/18/2014  . Hypertriglyceridemia 01/08/2014  . DM (diabetes mellitus) 07/12/2013  . Insomnia 07/12/2013  . Depression 07/12/2013  . Essential hypertension, benign 07/12/2013    Radonna Ricker 01/23/2015, 9:03 AM  [facility] [address] [place], [place], [address] Phone: [phone]   Fax:  [phone]

## 2015-01-23 NOTE — Patient Instructions (Signed)
  Copyright  VHI. All rights reserved.  Retraction: Scapula - Bilateral   Facing pulley, straps around shoulders, pull straps back by pinching shoulder blades together. Repeat ____ times per set. Do ____ sets per session. Do ____ sessions per week. Use ____ lb weights.  Copyright  VHI. All rights reserved. External Rotation (Eccentric), Active-Assist - Supine (Cane)   Lie on back, affected arm out from side, elbow at 90, forearm forward. Use cane to assist in lifting forearm of affected arm to neutral. Slowly lower for 3-5 seconds. ___ reps per set, ___ sets per day, ___ days per week. Add ___ lbs when you achieve ___ repetitions.  Copyright  VHI. All rights reserved.  Cane Exercise: Flexion   Lie on back, holding cane above chest. Keeping arms as straight as possible, lower cane toward floor beyond head. Hold ____ seconds. Repeat ____ times. Do ____ sessions per day.  http://gt2.exer.us/92   Copyright  VHI. All rights reserved.  Abduction (Eccentric) - Active-Assist (Cane)   Use unaffected arm to push affected arm out to side. Avoid hiking shoulder. Keep palm relaxed. Slowly lower affected arm for 3-5 seconds. ___ reps per set, ___ sets per day, ___ days per week. Add ___ lbs when you achieve ___ repetitions. Try to increase use of affected arm during lift/lowering.  Copyright  VHI. All rights reserved.

## 2015-01-26 ENCOUNTER — Encounter (HOSPITAL_COMMUNITY): Payer: Self-pay

## 2015-01-26 ENCOUNTER — Encounter (HOSPITAL_COMMUNITY)
Admission: RE | Admit: 2015-01-26 | Discharge: 2015-01-26 | Disposition: A | Discharge status: Home / Self Care | Payer: No Typology Code available for payment source | Source: Ambulatory Visit | Attending: Orthopedic Surgery | Admitting: Orthopedic Surgery

## 2015-01-26 DIAGNOSIS — M19012 Primary osteoarthritis, left shoulder: Secondary | ICD-10-CM | POA: Insufficient documentation

## 2015-01-26 DIAGNOSIS — Z01812 Encounter for preprocedural laboratory examination: Secondary | ICD-10-CM | POA: Insufficient documentation

## 2015-01-26 HISTORY — DX: Unspecified osteoarthritis, unspecified site: M19.90

## 2015-01-26 HISTORY — DX: Polyneuropathy, unspecified: G62.9

## 2015-01-26 HISTORY — DX: Pneumonia, unspecified organism: J18.9

## 2015-01-26 LAB — BASIC METABOLIC PANEL
Anion gap: 12 (ref 5–15)
BUN: 15 mg/dL (ref 6–20)
CALCIUM: 9.3 mg/dL (ref 8.9–10.3)
CO2: 23 mmol/L (ref 22–32)
Chloride: 100 mmol/L — ABNORMAL LOW (ref 101–111)
Creatinine, Ser: 1.23 mg/dL — ABNORMAL HIGH (ref 0.44–1.00)
GFR calc Af Amer: 57 mL/min — ABNORMAL LOW (ref >60)
GFR calc non Af Amer: 49 mL/min — ABNORMAL LOW (ref >60)
GLUCOSE: 171 mg/dL — AB (ref 65–99)
Potassium: 4.3 mmol/L (ref 3.5–5.1)
SODIUM: 135 mmol/L (ref 135–145)

## 2015-01-26 LAB — CBC
HCT: 41.4 % (ref 36.0–46.0)
HEMOGLOBIN: 13.5 g/dL (ref 12.0–15.0)
MCH: 32.7 pg (ref 26.0–34.0)
MCHC: 32.6 g/dL (ref 30.0–36.0)
MCV: 100.2 fL — ABNORMAL HIGH (ref 78.0–100.0)
PLATELETS: 146 10*3/uL — AB (ref 150–400)
RBC: 4.13 MIL/uL (ref 3.87–5.11)
RDW: 15.4 % (ref 11.5–15.5)
WBC: 5.2 10*3/uL (ref 4.0–10.5)

## 2015-01-26 LAB — GLUCOSE, CAPILLARY: GLUCOSE-CAPILLARY: 191 mg/dL — AB (ref 65–99)

## 2015-01-26 NOTE — Pre-Procedure Instructions (Signed)
Nyema Hachey  01/26/2015      Your procedure is scheduled on Tuesday, February 03, 2015 at 10:50 AM.   Report to [facility] Entrance "A" Admitting Office at 8:50 AM.   Call this number if you have problems the morning of surgery: [phone]   Any questions prior to day of surgery, please call [phone] between 8 & 4 PM.   Remember:  Do not eat food or drink liquids after midnight Monday, 02/02/15.  Take these medicines the morning of surgery with A SIP OF WATER: Duloxetine (Cymbalta), Gabapentin, Albuterol inhaler - if needed, Tylenol #3 - if needed.   How to Manage Your Diabetes Before Surgery   Why is it important to control my blood sugar before and after surgery?   Improving blood sugar levels before and after surgery helps healing and can limit problems.   A way of improving blood sugar control is eating a healthy diet by:  - Eating less sugar and carbohydrates  - Increasing activity/exercise  - Talk with your doctor about reaching your blood sugar goals   High blood sugars (greater than 180 mg/dL) can raise your risk of infections and slow down your recovery so you will need to focus on controlling your diabetes during the weeks before surgery.   Make sure that the doctor who takes care of your diabetes knows about your planned surgery including the date and location.  How do I manage my blood sugars before surgery?   Check your blood sugar at least 4 times a day, 2 days before surgery to make sure that they are not too high or low.    Check your blood sugar the morning of your surgery when you wake up and every 2 hours until you get to the Short-Stay unit.   Treat a low blood sugar (less than 70 mg/dL) with 1/2 cup of clear juice (cranberry or apple), 4 glucose tablets, OR glucose gel.   Recheck blood sugar in 15 minutes after treatment (to make sure it is greater than 70 mg/dL).  If blood sugar is not greater than 70 mg/dL on re-check, call  [phone] for further instructions.    Report your blood sugar to the Short-Stay nurse when you get to Short-Stay.  References:  [facility], 2007 "How to Manage your Diabetes Before and After Surgery".  What do I do about my diabetes medications?  The day before surgery, take only your morning dose of Glipizide (Glucotrol). Take your regular doses of Metformin (Glucophage).   Do not take oral diabetes medicines (pills) the morning of surgery.   Do not wear jewelry, make-up or nail polish.  Do not wear lotions, powders, or perfumes.  You NOT may wear deodorant.  Do not shave 48 hours prior to surgery.    Do not bring valuables to the hospital.  [facility] is not responsible for any belongings or valuables.  Contacts, dentures or bridgework may not be worn into surgery.  Leave your suitcase in the car.  After surgery it may be brought to your room.  For patients admitted to the hospital, discharge time will be determined by your treatment team.  Patients discharged the day of surgery will not be allowed to drive home.   Special instructions:  [facility] - Preparing for Surgery  Before surgery, you can play an important role.  Because skin is not sterile, your skin needs to be as free of germs as possible.  You can  reduce the number of germs on you skin by washing with CHG (chlorahexidine gluconate) soap before surgery.  CHG is an antiseptic cleaner which kills germs and bonds with the skin to continue killing germs even after washing.  Please DO NOT use if you have an allergy to CHG or antibacterial soaps.  If your skin becomes reddened/irritated stop using the CHG and inform your nurse when you arrive at Short Stay.  Do not shave (including legs and underarms) for at least 48 hours prior to the first CHG shower.  You may shave your face.  Please follow these instructions carefully:   1.  Shower with CHG Soap the night before surgery and the                                 morning of Surgery.  2.  If you choose to wash your hair, wash your hair first as usual with your       normal shampoo.  3.  After you shampoo, rinse your hair and body thoroughly to remove the                      Shampoo.  4.  Use CHG as you would any other liquid soap.  You can apply chg directly       to the skin and wash gently with scrungie or a clean washcloth.  5.  Apply the CHG Soap to your body ONLY FROM THE NECK DOWN.        Do not use on open wounds or open sores.  Avoid contact with your eyes, ears, mouth and genitals (private parts).  Wash genitals (private parts) with your normal soap.  6.  Wash thoroughly, paying special attention to the area where your surgery        will be performed.  7.  Thoroughly rinse your body with warm water from the neck down.  8.  DO NOT shower/wash with your normal soap after using and rinsing off       the CHG Soap.  9.  Pat yourself dry with a clean towel.            10.  Wear clean pajamas.            11.  Place clean sheets on your bed the night of your first shower and do not        sleep with pets.  Day of Surgery  Do not apply any lotions/deodorants the morning of surgery.  Please wear clean clothes to the hospital.    Please read over the following fact sheets that you were given. Pain Booklet, Coughing and Deep Breathing and Surgical Site Infection Prevention

## 2015-01-26 NOTE — Progress Notes (Signed)
   01/26/15 0919  OBSTRUCTIVE SLEEP APNEA  Have you ever been diagnosed with sleep apnea through a sleep study? No  Do you snore loudly (loud enough to be heard through closed doors)?  1  Do you often feel tired, fatigued, or sleepy during the daytime? 1  Has anyone observed you stop breathing during your sleep? 0  Do you have, or are you being treated for high blood pressure? 1  BMI more than 35 kg/m2? 1  Age over 55 years old? 1  Neck circumference greater than 40 cm/16 inches? 0  Gender: 0

## 2015-01-26 NOTE — Progress Notes (Addendum)
Pt had her grandson and 3 young children with her during her PAT appt. She states only her grandson will be with her daughter the day of surgery. Pt denies any recent chest pain. Had stress test in 2015 which was normal.  Pt is diabetic, today's CBG was 191. She states she has not taken her meds or eaten yet. She states her fasting blood sugar is usually lower.  Stopbang assessment sent to PCP

## 2015-02-02 ENCOUNTER — Other Ambulatory Visit (HOSPITAL_COMMUNITY): Payer: Self-pay | Admitting: Internal Medicine

## 2015-02-02 DIAGNOSIS — Z1231 Encounter for screening mammogram for malignant neoplasm of breast: Secondary | ICD-10-CM

## 2015-02-02 MED ORDER — CHLORHEXIDINE GLUCONATE 4 % EX LIQD: 60.0000 mL | Freq: Once | CUTANEOUS | Status: DC

## 2015-02-02 MED ORDER — CEFAZOLIN SODIUM-DEXTROSE 2-3 GM-% IV SOLR
2.0000 g | INTRAVENOUS | Status: AC
  Administered 2015-02-03: 2 g via INTRAVENOUS
  Filled 2015-02-02: qty 50

## 2015-02-03 ENCOUNTER — Ambulatory Visit (HOSPITAL_COMMUNITY)
Admission: RE | Admit: 2015-02-03 | Discharge: 2015-02-03 | Disposition: A | Discharge status: Home / Self Care | Payer: No Typology Code available for payment source | Source: Ambulatory Visit | Attending: Orthopedic Surgery | Admitting: Orthopedic Surgery

## 2015-02-03 ENCOUNTER — Ambulatory Visit (HOSPITAL_COMMUNITY): Payer: Self-pay | Admitting: Certified Registered"

## 2015-02-03 ENCOUNTER — Encounter (HOSPITAL_COMMUNITY)
Admission: RE | Disposition: A | Discharge status: Home / Self Care | Payer: Self-pay | Source: Ambulatory Visit | Attending: Orthopedic Surgery

## 2015-02-03 ENCOUNTER — Ambulatory Visit (HOSPITAL_COMMUNITY): Payer: No Typology Code available for payment source | Admitting: Certified Registered"

## 2015-02-03 ENCOUNTER — Encounter (HOSPITAL_COMMUNITY): Payer: Self-pay | Admitting: Certified Registered"

## 2015-02-03 DIAGNOSIS — I10 Essential (primary) hypertension: Secondary | ICD-10-CM | POA: Insufficient documentation

## 2015-02-03 DIAGNOSIS — F329 Major depressive disorder, single episode, unspecified: Secondary | ICD-10-CM | POA: Insufficient documentation

## 2015-02-03 DIAGNOSIS — M7552 Bursitis of left shoulder: Secondary | ICD-10-CM | POA: Insufficient documentation

## 2015-02-03 DIAGNOSIS — E1140 Type 2 diabetes mellitus with diabetic neuropathy, unspecified: Secondary | ICD-10-CM | POA: Insufficient documentation

## 2015-02-03 DIAGNOSIS — M5416 Radiculopathy, lumbar region: Secondary | ICD-10-CM | POA: Insufficient documentation

## 2015-02-03 DIAGNOSIS — F419 Anxiety disorder, unspecified: Secondary | ICD-10-CM | POA: Insufficient documentation

## 2015-02-03 DIAGNOSIS — E781 Pure hyperglyceridemia: Secondary | ICD-10-CM | POA: Insufficient documentation

## 2015-02-03 DIAGNOSIS — Z91013 Allergy to seafood: Secondary | ICD-10-CM | POA: Insufficient documentation

## 2015-02-03 DIAGNOSIS — M19012 Primary osteoarthritis, left shoulder: Secondary | ICD-10-CM | POA: Insufficient documentation

## 2015-02-03 DIAGNOSIS — M94212 Chondromalacia, left shoulder: Secondary | ICD-10-CM | POA: Insufficient documentation

## 2015-02-03 DIAGNOSIS — Z79899 Other long term (current) drug therapy: Secondary | ICD-10-CM | POA: Insufficient documentation

## 2015-02-03 DIAGNOSIS — G4700 Insomnia, unspecified: Secondary | ICD-10-CM | POA: Insufficient documentation

## 2015-02-03 LAB — GLUCOSE, CAPILLARY: Glucose-Capillary: 171 mg/dL — ABNORMAL HIGH (ref 65–99)

## 2015-02-03 SURGERY — SHOULDER ARTHROSCOPY WITH SUBACROMIAL DECOMPRESSION AND DISTAL CLAVICLE EXCISION
Anesthesia: General | Site: Shoulder | Laterality: Left

## 2015-02-03 MED ORDER — FENTANYL CITRATE (PF) 100 MCG/2ML IJ SOLN
INTRAMUSCULAR | Status: DC | PRN
  Administered 2015-02-03: 150 ug via INTRAVENOUS
  Administered 2015-02-03 (×2): 50 ug via INTRAVENOUS

## 2015-02-03 MED ORDER — MORPHINE SULFATE (PF) 4 MG/ML IV SOLN
INTRAVENOUS | Status: DC | PRN
  Administered 2015-02-03: 8 mg

## 2015-02-03 MED ORDER — ONDANSETRON HCL 4 MG/2ML IJ SOLN
INTRAMUSCULAR | Status: DC | PRN
  Administered 2015-02-03: 4 mg via INTRAVENOUS

## 2015-02-03 MED ORDER — DEXTROSE 5 % IV SOLN
10.0000 mg | INTRAVENOUS | Status: DC | PRN
  Administered 2015-02-03: 27 ug/min via INTRAVENOUS

## 2015-02-03 MED ORDER — FENTANYL CITRATE (PF) 100 MCG/2ML IJ SOLN
INTRAMUSCULAR | Status: AC
  Administered 2015-02-03: 50 ug
  Filled 2015-02-03: qty 2

## 2015-02-03 MED ORDER — DEXAMETHASONE SODIUM PHOSPHATE 4 MG/ML IJ SOLN
INTRAMUSCULAR | Status: DC | PRN
  Administered 2015-02-03: 4 mg via INTRAVENOUS

## 2015-02-03 MED ORDER — ONDANSETRON HCL 4 MG/2ML IJ SOLN: 4.0000 mg | Freq: Once | INTRAMUSCULAR | Status: DC | PRN

## 2015-02-03 MED ORDER — BUPIVACAINE-EPINEPHRINE (PF) 0.25% -1:200000 IJ SOLN
INTRAMUSCULAR | Status: DC | PRN
  Administered 2015-02-03: 10 mL via PERINEURAL

## 2015-02-03 MED ORDER — LACTATED RINGERS IV SOLN
INTRAVENOUS | Status: DC | PRN
  Administered 2015-02-03: 10:00:00 via INTRAVENOUS

## 2015-02-03 MED ORDER — LACTATED RINGERS IV SOLN
INTRAVENOUS | Status: DC
  Administered 2015-02-03: 50 mL/h via INTRAVENOUS

## 2015-02-03 MED ORDER — FENTANYL CITRATE (PF) 100 MCG/2ML IJ SOLN: INTRAMUSCULAR | Status: DC | PRN

## 2015-02-03 MED ORDER — FENTANYL CITRATE (PF) 100 MCG/2ML IJ SOLN: 25.0000 ug | INTRAMUSCULAR | Status: DC | PRN

## 2015-02-03 MED ORDER — OXYCODONE HCL 5 MG/5ML PO SOLN: 5.0000 mg | Freq: Once | ORAL | Status: DC | PRN

## 2015-02-03 MED ORDER — LIDOCAINE HCL (CARDIAC) 20 MG/ML IV SOLN
INTRAVENOUS | Status: DC | PRN
  Administered 2015-02-03: 50 mg via INTRAVENOUS

## 2015-02-03 MED ORDER — EPINEPHRINE HCL 1 MG/ML IJ SOLN
INTRAMUSCULAR | Status: AC
  Filled 2015-02-03: qty 1

## 2015-02-03 MED ORDER — CLONIDINE HCL (ANALGESIA) 100 MCG/ML EP SOLN
150.0000 ug | Freq: Once | EPIDURAL | Status: AC
  Administered 2015-02-03: 1 mL via INTRA_ARTICULAR
  Filled 2015-02-03: qty 1.5

## 2015-02-03 MED ORDER — SUCCINYLCHOLINE CHLORIDE 20 MG/ML IJ SOLN
INTRAMUSCULAR | Status: DC | PRN
  Administered 2015-02-03: 100 mg via INTRAVENOUS

## 2015-02-03 MED ORDER — EPINEPHRINE HCL 1 MG/ML IJ SOLN
INTRAMUSCULAR | Status: DC | PRN
  Administered 2015-02-03: .1 mL

## 2015-02-03 MED ORDER — SODIUM CHLORIDE 0.9 % IR SOLN
Status: DC | PRN
  Administered 2015-02-03: 3000 mL

## 2015-02-03 MED ORDER — SODIUM CHLORIDE 0.9 % IJ SOLN
INTRAMUSCULAR | Status: DC | PRN
  Administered 2015-02-03: 50 mL

## 2015-02-03 MED ORDER — SODIUM CHLORIDE 0.9 % IR SOLN
Status: DC | PRN
  Administered 2015-02-03: 1000 mL

## 2015-02-03 MED ORDER — BUPIVACAINE-EPINEPHRINE (PF) 0.25% -1:200000 IJ SOLN
INTRAMUSCULAR | Status: AC
  Filled 2015-02-03: qty 30

## 2015-02-03 MED ORDER — FENTANYL CITRATE (PF) 250 MCG/5ML IJ SOLN
INTRAMUSCULAR | Status: AC
  Filled 2015-02-03: qty 5

## 2015-02-03 MED ORDER — MORPHINE SULFATE (PF) 4 MG/ML IV SOLN
INTRAVENOUS | Status: AC
  Filled 2015-02-03: qty 2

## 2015-02-03 MED ORDER — MIDAZOLAM HCL 2 MG/2ML IJ SOLN
INTRAMUSCULAR | Status: AC
  Administered 2015-02-03: 1 mg
  Filled 2015-02-03: qty 2

## 2015-02-03 MED ORDER — OXYCODONE HCL 5 MG PO TABS: 5.0000 mg | ORAL_TABLET | Freq: Once | ORAL | Status: DC | PRN

## 2015-02-03 MED ORDER — PROPOFOL 10 MG/ML IV BOLUS
INTRAVENOUS | Status: AC
  Filled 2015-02-03: qty 20

## 2015-02-03 MED ORDER — PHENYLEPHRINE HCL 10 MG/ML IJ SOLN
INTRAMUSCULAR | Status: DC | PRN
  Administered 2015-02-03 (×2): 80 ug via INTRAVENOUS

## 2015-02-03 MED ORDER — PROPOFOL 10 MG/ML IV BOLUS
INTRAVENOUS | Status: DC | PRN
  Administered 2015-02-03: 175 mg via INTRAVENOUS

## 2015-02-03 SURGICAL SUPPLY — 72 items
BENZOIN TINCTURE PRP APPL 2/3 (GAUZE/BANDAGES/DRESSINGS) IMPLANT
BIT DRILL TAK (DRILL) IMPLANT
BLADE CUDA 5.5 (BLADE) IMPLANT
BLADE CUTTER GATOR 3.5 (BLADE) IMPLANT
BLADE GREAT WHITE 4.2 (BLADE) ×2 IMPLANT
BLADE GREAT WHITE 4.2MM (BLADE) ×1
BLADE SURG 11 STRL SS (BLADE) ×3 IMPLANT
BUR GATOR 2.9 (BURR) IMPLANT
BUR GATOR 2.9MM (BURR)
BUR OVAL 6.0 (BURR) IMPLANT
CANNULA SHOULDER 7CM (CANNULA) IMPLANT
CARTRIDGE CURVETEK MED (MISCELLANEOUS) IMPLANT
CARTRIDGE CURVETEK XLRG (MISCELLANEOUS) IMPLANT
CLOSURE WOUND 1/2 X4 (GAUZE/BANDAGES/DRESSINGS) ×1
COVER SURGICAL LIGHT HANDLE (MISCELLANEOUS) ×3 IMPLANT
DRAPE IMP U-DRAPE 54X76 (DRAPES) ×3 IMPLANT
DRAPE INCISE IOBAN 66X45 STRL (DRAPES) ×6 IMPLANT
DRAPE STERI 35X30 U-POUCH (DRAPES) ×6 IMPLANT
DRAPE U-SHAPE 47X51 STRL (DRAPES) ×6 IMPLANT
DRILL TAK (DRILL)
DRSG MEPILEX BORDER 4X4 (GAUZE/BANDAGES/DRESSINGS) ×3 IMPLANT
DRSG PAD ABDOMINAL 8X10 ST (GAUZE/BANDAGES/DRESSINGS) IMPLANT
DURAPREP 26ML APPLICATOR (WOUND CARE) ×3 IMPLANT
ELECT MENISCUS 165MM 90D (ELECTRODE) IMPLANT
ELECT REM PT RETURN 9FT ADLT (ELECTROSURGICAL) ×3
ELECTRODE REM PT RTRN 9FT ADLT (ELECTROSURGICAL) ×1 IMPLANT
FILTER STRAW FLUID ASPIR (MISCELLANEOUS) ×3 IMPLANT
GAUZE SPONGE 4X4 12PLY STRL (GAUZE/BANDAGES/DRESSINGS) ×3 IMPLANT
GAUZE XEROFORM 1X8 LF (GAUZE/BANDAGES/DRESSINGS) ×3 IMPLANT
GLOVE BIO SURGEON ST LM GN SZ9 (GLOVE) ×3 IMPLANT
GLOVE BIOGEL PI IND STRL 8 (GLOVE) ×1 IMPLANT
GLOVE BIOGEL PI INDICATOR 8 (GLOVE) ×2
GLOVE SURG ORTHO 8.0 STRL STRW (GLOVE) ×3 IMPLANT
GOWN STRL REUS W/ TWL LRG LVL3 (GOWN DISPOSABLE) ×3 IMPLANT
GOWN STRL REUS W/TWL LRG LVL3 (GOWN DISPOSABLE) ×6
KIT BASIN OR (CUSTOM PROCEDURE TRAY) ×3 IMPLANT
KIT ROOM TURNOVER OR (KITS) ×3 IMPLANT
MANIFOLD NEPTUNE II (INSTRUMENTS) ×3 IMPLANT
NDL SUT 6 .5 CRC .975X.05 MAYO (NEEDLE) IMPLANT
NEEDLE HYPO 25X1 1.5 SAFETY (NEEDLE) ×3 IMPLANT
NEEDLE MAYO TAPER (NEEDLE)
NEEDLE SPNL 18GX3.5 QUINCKE PK (NEEDLE) ×3 IMPLANT
NS IRRIG 1000ML POUR BTL (IV SOLUTION) ×3 IMPLANT
PACK SHOULDER (CUSTOM PROCEDURE TRAY) ×3 IMPLANT
PACK UNIVERSAL I (CUSTOM PROCEDURE TRAY) IMPLANT
PAD ARMBOARD 7.5X6 YLW CONV (MISCELLANEOUS) ×6 IMPLANT
SET ARTHROSCOPY TUBING (MISCELLANEOUS) ×2
SET ARTHROSCOPY TUBING LN (MISCELLANEOUS) ×1 IMPLANT
SLING ARM FOAM STRAP LRG (SOFTGOODS) ×3 IMPLANT
SLING ARM IMMOBILIZER MED (SOFTGOODS) IMPLANT
SPEAR FASTAKII (SLEEVE) IMPLANT
SPONGE LAP 4X18 X RAY DECT (DISPOSABLE) ×3 IMPLANT
STRIP CLOSURE SKIN 1/2X4 (GAUZE/BANDAGES/DRESSINGS) ×2 IMPLANT
SUCTION FRAZIER TIP 10 FR DISP (SUCTIONS) ×3 IMPLANT
SUT ETHILON 3 0 PS 1 (SUTURE) ×6 IMPLANT
SUT FIBERWIRE 2-0 18 17.9 3/8 (SUTURE)
SUT PROLENE 3 0 PS 2 (SUTURE) IMPLANT
SUT VIC AB 0 CT1 27 (SUTURE)
SUT VIC AB 0 CT1 27XBRD ANBCTR (SUTURE) IMPLANT
SUT VIC AB 1 CT1 27 (SUTURE)
SUT VIC AB 1 CT1 27XBRD ANBCTR (SUTURE) IMPLANT
SUT VIC AB 2-0 CT1 27 (SUTURE)
SUT VIC AB 2-0 CT1 TAPERPNT 27 (SUTURE) IMPLANT
SUT VICRYL 0 UR6 27IN ABS (SUTURE) IMPLANT
SUTURE FIBERWR 2-0 18 17.9 3/8 (SUTURE) IMPLANT
SYR 20CC LL (SYRINGE) ×3 IMPLANT
SYR 3ML LL SCALE MARK (SYRINGE) ×3 IMPLANT
SYR TB 1ML LUER SLIP (SYRINGE) ×3 IMPLANT
TOWEL OR 17X24 6PK STRL BLUE (TOWEL DISPOSABLE) ×3 IMPLANT
TOWEL OR 17X26 10 PK STRL BLUE (TOWEL DISPOSABLE) ×3 IMPLANT
WAND HAND CNTRL MULTIVAC 90 (MISCELLANEOUS) ×3 IMPLANT
WATER STERILE IRR 1000ML POUR (IV SOLUTION) ×3 IMPLANT

## 2015-02-03 NOTE — H&P (Signed)
Briana Alvarez is an 54 y.o. female.   Chief Complaint: Left shoulder pain HPI: Briana Alvarez is a 54 year old patient with extensive medical history who had an injury several months ago involving her left shoulder. MRI scan subsequently demonstrated before meals joint edema and tearing of the trapezius muscle distally. She is failed nonoperative conference of course of treatment and presents with persistent shoulder pain. She also describes neck symptoms but she has been evaluated by her spinal surgeon who does not believe that the neck is at play in her current pain  Symptoms she denies any instability in the shoulder but does report pain with overhead motion  Past Medical History  Diagnosis Date  . Diabetes mellitus   . Hypertension   . Depression   . Anxiety   . Pneumonia   . Headache     "2 a month"  . Neuropathy   . Arthritis     Past Surgical History  Procedure Laterality Date  . Abdominal hysterectomy    . Carpal tunnel release    . Ulner nerve     . Tonsillectomy    . Neck surgery    . Knee surgery Left   . Knee arthroscopy Right     Family History  Problem Relation Age of Onset  . Cancer Mother   . Diabetes Daughter   . Diabetes Maternal Grandmother   . Heart attack Neg Hx   . Hypertension Mother   . Hypertension Maternal Grandmother   . Stroke Neg Hx    Social History:  reports that she has never smoked. She has never used smokeless tobacco. She reports that she does not drink alcohol or use illicit drugs.  Allergies:  Allergies  Allergen Reactions  . Shrimp [Shellfish Allergy] Anaphylaxis and Hives    Shrimp - hives    No prescriptions prior to admission    No results found for this or any previous visit (from the past 48 hour(s)). No results found.  Review of Systems  Constitutional: Negative.   HENT: Negative.   Eyes: Negative.   Respiratory: Negative.   Cardiovascular: Negative.   Gastrointestinal: Negative.   Genitourinary: Negative.    Musculoskeletal: Positive for joint pain.  Skin: Negative.   Neurological: Negative.   Psychiatric/Behavioral: Negative.     There were no vitals taken for this visit. Physical Exam  Constitutional: She appears well-developed.  HENT:  Head: Normocephalic.  Eyes: Pupils are equal, round, and reactive to light.  Neck: Normal range of motion.  Cardiovascular: Normal rate.   Respiratory: Effort normal.  Neurological: She is alert.  Skin: Skin is warm.  Psychiatric: She has a normal mood and affect.   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

## 2015-02-03 NOTE — Anesthesia Preprocedure Evaluation (Signed)
Anesthesia Evaluation  Patient identified by MRN, date of birth, ID band Patient awake    Reviewed: Allergy & Precautions, NPO status , Patient's Chart, lab work & pertinent test results  Airway Mallampati: II  TM Distance: >3 FB Neck ROM: Full    Dental  (+) Edentulous Upper, Edentulous Lower   Pulmonary  breath sounds clear to auscultation        Cardiovascular hypertension, Rhythm:Regular Rate:Normal     Neuro/Psych    GI/Hepatic   Endo/Other  diabetes  Renal/GU      Musculoskeletal   Abdominal (+) + obese,   Peds  Hematology   Anesthesia Other Findings   Reproductive/Obstetrics                             Anesthesia Physical Anesthesia Plan  ASA: III  Anesthesia Plan: General   Post-op Pain Management: MAC Combined w/ Regional for Post-op pain   Induction: Intravenous  Airway Management Planned: Oral ETT  Additional Equipment:   Intra-op Plan:   Post-operative Plan: Extubation in OR  Informed Consent: I have reviewed the patients History and Physical, chart, labs and discussed the procedure including the risks, benefits and alternatives for the proposed anesthesia with the patient or authorized representative who has indicated his/her understanding and acceptance.     Plan Discussed with: CRNA and Anesthesiologist  Anesthesia Plan Comments: (Type 2 DM glucose 178 Hypertension Obesity  Plan GA with oral ETT and interscalene block)        Anesthesia Quick Evaluation

## 2015-02-03 NOTE — Anesthesia Procedure Notes (Addendum)
Procedure Name: Intubation Performed by: Duke Salvia Pre-anesthesia Checklist: Patient identified, Emergency Drugs available, Suction available and Patient being monitored Patient Re-evaluated:Patient Re-evaluated prior to inductionOxygen Delivery Method: Circle system utilized Preoxygenation: Pre-oxygenation with 100% oxygen Intubation Type: IV induction Ventilation: Mask ventilation without difficulty Laryngoscope Size: 2 and 3 Number of attempts: 1 Airway Equipment and Method: Patient positioned with wedge pillow Placement Confirmation: ETT inserted through vocal cords under direct vision,  breath sounds checked- equal and bilateral and positive ETCO2 Secured at: 21 cm Tube secured with: Tape Dental Injury: Teeth and Oropharynx as per pre-operative assessment    Anesthesia Regional Block:  Interscalene brachial plexus block  Pre-Anesthetic Checklist: ,, timeout performed, Correct Patient, Correct Site, Correct Laterality, Correct Procedure, Correct Position, site marked, Risks and benefits discussed,  Surgical consent,  Pre-op evaluation,  At surgeon's request and post-op pain management  Laterality: Left  Prep: chloraprep       Needles:  Injection technique: Single-shot  Needle Type: Echogenic Stimulator Needle     Needle Length: 9cm 9 cm Needle Gauge: 21 and 21 G    Additional Needles: Interscalene brachial plexus block Narrative:  Start time: 02/03/2015 9:50 AM End time: 02/03/2015 9:55 AM Injection made incrementally with aspirations every 5 mL.  Performed by: Personally   Additional Notes: 20 cc 0.5% bupivacaine with 1:200 epi injected easily

## 2015-02-03 NOTE — Op Note (Signed)
NAMEKAMPBELL, HOLAWAY                ACCOUNT NO.:  [id number]  MEDICAL RECORD NO.:  [id number]  LOCATION:  [facility]                         FACILITY:  [facility]  PHYSICIAN:  Anderson Malta, M.D.    DATE OF BIRTH:  1961-06-10  DATE OF PROCEDURE: DATE OF DISCHARGE:  02/03/2015                              OPERATIVE REPORT   PREOPERATIVE DIAGNOSIS:  Left shoulder acromioclavicular joint inflammation, arthritis and bursitis.  POSTOPERATIVE DIAGNOSIS:  Left shoulder acromioclavicular joint inflammation, arthritis and bursitis.  PROCEDURE:  Left shoulder arthroscopy with bursectomy and arthroscopic distal clavicle excision.  SURGEON:  Anderson Malta, M.D.  ASSISTANT:  Laure Kidney, RNFA.  ANESTHESIA:  General.  INDICATIONS:  Briana Alvarez is a 54 year old patient with left shoulder pain long duration, failure of conservative management and presents for operative management after explanation of risks and benefits.  OPERATIVE FINDINGS: 1. Examination under anesthesia, the patient had external rotation at     15 degrees, abduction about 70 degrees, full forward flexion, full     abduction without restriction, shoulder stability excellent, 1+     anterior posterior instability. 2. Diagnostic operative arthroscopy.     a.     Mild fraying, undersurface of the biceps anchor, but stable      anchor.     b.     No tenosynovitis of rotator interval.     c.     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     d.     No corresponding changes on the humeral head.     e.     Intact glenohumeral ligaments.     f.     Bursitis.     g.     AC joint arthritis and inflammation.  PROCEDURE IN DETAIL:  The patient was brought to the operating room where general anesthetic was induced.  Preoperative antibiotics were administered.  Time-out was called.  The patient was placed in the beach- chair position with the head in neutral position.  Left shoulder  was prescrubbed with alcohol and Betadine, allowed to air dry, prepped with DuraPrep solution and draped in a sterile manner.  Charlie Pitter was used to cover the operative field.  Posterior portal was created 2 cm medial and inferior to the posterolateral margin of the acromion.  Diagnostic arthroscopy was performed.  Anterior portal was created under direct visualization and in line with the anticipated course of the distal end of the clavicle, which was marked with a 25-gauge needle on the skin. The patient had fairly significant glenoid wear on the anterior half of the glenoid, this was debrided using shaver.  The rotator cuff was intact, biceps anchor although somewhat frayed, had no instability and was intact.  Biceps tendon itself was intact.  Surprisingly, no degenerative changes present on the humeral head surface.  At this time, the scope was placed into the subacromial space.  ArthroCare wand was utilized to remove bursitis.  Distal clavicle was excised approximately 8 mm.  This clavicle itself was stable.  Superior and posterior ligaments were visualized and left intact.  Following distal clavicle excision and bursectomy,  the MRI scan was reviewed in the sagittal plane and this was confirmed visually that there was actually no bony impingement.  For this reason, partial debridement of the CA ligament was performed without frank release of the CA ligament.  Following this, thorough irrigation was performed.  Instruments were removed.  Portals were closed using 3-0 Vicryl, 3-0 nylon for the anterior portal and 3-0 nylon for the posterior and lateral portals.  A sling was applied.  The patient tolerated the procedure well without immediate complications, transferred to the recovery room in stable condition.     Anderson Malta, M.D.     GSD/MEDQ  D:  02/03/2015  T:  02/03/2015  Job:  [id number]

## 2015-02-03 NOTE — Transfer of Care (Signed)
Immediate Anesthesia Transfer of Care Note  Patient: Briana Alvarez  Procedure(s) Performed: Procedure(s) with comments: SHOULDER ARTHROSCOPY WITH SUBACROMIAL DECOMPRESSION AND DISTAL CLAVICLE EXCISION (Left) - LEFT SHOULDER DIAGNOSTIC OPERATIVE ARTHROSCOPY, DEBRIDEMENT, SUBACROMIAL DECOMPRESSION, DISTAL CLAVICLE EXCISION.  Patient Location: PACU  Anesthesia Type:General and Regional  Level of Consciousness: awake, alert , oriented and sedated  Airway & Oxygen Therapy: Patient Spontanous Breathing and Patient connected to nasal cannula oxygen  Post-op Assessment: Report given to RN and Post -op Vital signs reviewed and stable  Post vital signs: Reviewed and stable  Last Vitals:  Filed Vitals:   02/03/15 0955  BP: 169/106  Pulse: 73  Temp:   Resp: 24    Complications: No apparent anesthesia complications

## 2015-02-03 NOTE — Anesthesia Postprocedure Evaluation (Signed)
  Anesthesia Post-op Note  Patient: Briana Alvarez  Procedure(s) Performed: Procedure(s) with comments: SHOULDER ARTHROSCOPY WITH SUBACROMIAL DECOMPRESSION AND DISTAL CLAVICLE EXCISION (Left) - LEFT SHOULDER DIAGNOSTIC OPERATIVE ARTHROSCOPY, DEBRIDEMENT, SUBACROMIAL DECOMPRESSION, DISTAL CLAVICLE EXCISION.  Patient Location: PACU  Anesthesia Type:General and GA combined with regional for post-op pain  Level of Consciousness: awake, alert  and oriented  Airway and Oxygen Therapy: Patient Spontanous Breathing and Patient connected to nasal cannula oxygen  Post-op Pain: none  Post-op Assessment: Post-op Vital signs reviewed, Patient's Cardiovascular Status Stable, Respiratory Function Stable, Patent Airway and Pain level controlled              Post-op Vital Signs: stable  Last Vitals:  Filed Vitals:   02/03/15 1221  BP: 171/88  Pulse: 81  Temp:   Resp: 16    Complications: No apparent anesthesia complications

## 2015-02-03 NOTE — Brief Op Note (Signed)
02/03/2015  11:39 AM  PATIENT:  Briana Alvarez  54 y.o. female  PRE-OPERATIVE DIAGNOSIS:  LEFT SHOULDER ACROMIOCLAVICLULAR OSTEOARTHRITIS, BURSITIS  POST-OPERATIVE DIAGNOSIS:  LEFT SHOULDER ACROMIOCLAVICLULAR   PROCEDURE:  Procedure(s): SHOULDER ARTHROSCOPY WITH SUBACROMIAL DECOMPRESSION AND DISTAL CLAVICLE EXCISION  SURGEON:  Surgeon(s): Meredith Pel, MD  ASSISTANT: carla bethune rnfa  ANESTHESIA:   regional and general  EBL: 10 ml       BLOOD ADMINISTERED: none  DRAINS: none   LOCAL MEDICATIONS USED:  Marcaine mso4 clonidine  SPECIMEN:  No Specimen  COUNTS:  YES  TOURNIQUET:  * No tourniquets in log *  DICTATION: .Other Dictation: Dictation Number [id number]  PLAN OF CARE: Discharge to home after PACU  PATIENT DISPOSITION:  PACU - hemodynamically stable

## 2015-02-04 ENCOUNTER — Ambulatory Visit: Payer: No Typology Code available for payment source

## 2015-02-06 ENCOUNTER — Telehealth: Payer: Self-pay | Admitting: Internal Medicine

## 2015-02-06 NOTE — Telephone Encounter (Signed)
Patient called requesting to speak to nurse, pt states she recently had surgery and was given oxycodone but would rather take tylenol #3. Patient would like to know if medication would be authorized for refill

## 2015-02-10 ENCOUNTER — Ambulatory Visit: Payer: No Typology Code available for payment source | Admitting: Physical Therapy

## 2015-02-10 ENCOUNTER — Other Ambulatory Visit: Payer: Self-pay | Admitting: Internal Medicine

## 2015-02-12 ENCOUNTER — Ambulatory Visit: Payer: No Typology Code available for payment source | Admitting: Physical Therapy

## 2015-02-12 ENCOUNTER — Telehealth: Payer: Self-pay

## 2015-02-12 DIAGNOSIS — M25512 Pain in left shoulder: Secondary | ICD-10-CM

## 2015-02-12 DIAGNOSIS — Z9889 Other specified postprocedural states: Secondary | ICD-10-CM

## 2015-02-12 DIAGNOSIS — M25612 Stiffness of left shoulder, not elsewhere classified: Secondary | ICD-10-CM

## 2015-02-12 NOTE — Therapy (Signed)
[place], [place], [address] Phone: [phone]   Fax:  [phone]  Physical Therapy Evaluation  Patient Details  Name: Briana Alvarez MRN: [id number] Date of Birth: 14-Apr-1961 Referring Provider:  Jessy Oto, MD  Encounter Date: 02/12/2015      PT End of Session - 02/12/15 1317    Visit Number 1   Number of Visits 16   Date for PT Re-Evaluation 04/09/15   PT Start Time 1147   PT Stop Time 1227   PT Time Calculation (min) 40 min   Activity Tolerance Patient tolerated treatment well   Behavior During Therapy [facility] for tasks assessed/performed      Past Medical History  Diagnosis Date  . Diabetes mellitus   . Hypertension   . Depression   . Anxiety   . Pneumonia   . Headache     "2 a month"  . Neuropathy   . Arthritis     Past Surgical History  Procedure Laterality Date  . Abdominal hysterectomy    . Carpal tunnel release    . Ulner nerve     . Tonsillectomy    . Neck surgery    . Knee surgery Left   . Knee arthroscopy Right     There were no vitals filed for this visit.  Visit Diagnosis:  S/P shoulder surgery  Pain in joint, shoulder region, left  Decreased ROM of left shoulder      Subjective Assessment - 02/12/15 1152    Subjective Pt was previous being seen for pre-hab. Pt. has now had her  shoulder surgery on 02/03/15 and has returned for rehab. Once this shoulder is back functioning the doctors plan to perform the same surgery on her Rt. shoulder.   Pertinent History fall on 12/26/14, surgery 02/03/15 (Shoulder arthroscopy with subacromial decompresion an distal clavical excision, debridement), narrowing in both shoulders, spurs, arthritis   How long can you sit comfortably? every hour has to stand due to back   How long can you stand comfortably? pt is able to stand long enough to carry on a conversation with someone   Diagnostic tests MRI 7/14 following fall   Patient  Stated Goals get back to using her arm normally   Currently in Pain? No/denies   Pain Location Shoulder   Pain Orientation Left   Pain Type Surgical pain   Pain Radiating Towards n/a   Pain Onset 1 to 4 weeks ago   Pain Frequency Intermittent   Aggravating Factors  movement to shoulder   Pain Relieving Factors ice packs   Multiple Pain Sites No            [facility] PT Assessment - 02/12/15 1154    Assessment   Medical Diagnosis S/p lt shoulder surgery   Onset Date/Surgical Date 02/03/15  fell 12/26/14,    Hand Dominance Right   Next MD Visit pre op on the 8th of this month   Prior Therapy yes for neck   Precautions   Precautions None   Type of Shoulder Precautions  MD told patient not to lift arms overhead if possible   Precaution Comments no lifting, no horizontal adduction   Restrictions   Weight Bearing Restrictions No   Balance Screen   Has the patient fallen in the past 6 months Yes   How many times? 1   Has the patient had a decrease in activity level because of a fear of falling?  No  Is the patient reluctant to leave their home because of a fear of falling?  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, full time student   Cognition   Overall Cognitive Status Within Functional Limits for tasks assessed   Observation/Other Assessments   Skin Integrity intact   Focus on Therapeutic Outcomes (FOTO)  NT   Sensation   Light Touch Appears Intact   Coordination   Gross Motor Movements are Fluid and Coordinated Not tested   Posture/Postural Control   Posture/Postural Control Postural limitations   Postural Limitations Rounded Shoulders;Forward head;Decreased lumbar lordosis   AROM   Overall AROM  Deficits   Overall AROM Comments in supine    AROM Assessment Site Shoulder   Right/Left Shoulder Left   Right Shoulder Flexion 98 Degrees   Right Shoulder ABduction 50 Degrees   Right Shoulder Internal Rotation 80 Degrees  at 70degrees of abduction   Right Shoulder External Rotation 50 Degrees  at 70 degrees of abduction   Left Shoulder Flexion 50 Degrees  seated, supine-165   Left Shoulder ABduction 172 Degrees  seated, supine 160   Left Shoulder Internal Rotation 70 Degrees  supine   Left Shoulder External Rotation 65 Degrees  supine   Cervical Flexion --   Cervical Extension --   Cervical - Right Side Bend --   Cervical - Left Side Bend --   Cervical - Right Rotation --   Cervical - Left Rotation --   Strength   Overall Strength Unable to assess;Due to precautions;Due to pain   Palpation   Palpation comment --                           PT Education - 02/12/15 1316    Education provided Yes   Education Details HEP   Person(s) Educated Patient   Methods Explanation;Demonstration;Handout   Comprehension Verbalized understanding;Returned demonstration          PT Short Term Goals - 02/12/15 1325    PT SHORT TERM GOAL #1   Title Pt will be I with initial HEP   Time 2   Period Weeks   Status New   PT SHORT TERM GOAL #2   Title Pt will demonstrate and verbalize understanding of condition management including ice/heat, positioning for comfort   Time 2   Period Weeks   Status New   PT SHORT TERM GOAL #3   Title Pt. will demo full AROM in supine into shoulder flexion and abduction    Time 4   Period Weeks   Status New           PT Long Term Goals - 02/12/15 1325    PT LONG TERM GOAL #1   Title Pt. will be I with advanced HEP   Time 8   Period Weeks   Status New   PT LONG TERM GOAL #2   Title Pt. will report 50% reduction in  pain with shoulder abduction movement in order to wash her hair   Time 6   Period Weeks   Status New   PT LONG TERM GOAL #3   Title Pt. will demo  improved strength to 4+/5  without pain (shoulder flexion and abduction)  Time 6   Period Weeks   Status New   PT LONG TERM GOAL #4   Title pt. will report improvement in ability to lift things such as her purse without increased pain (once lifting restrictions are lifted)   Time 8   Period Weeks   Status New   PT LONG TERM GOAL #5   Title Pt will demo increased AROM in sitting shoulder flexion from 50 to 110 degrees in order to perofrm functional activities like reaching into cabinents overhead wthout pain.   Time 8   Period Weeks   Status New               Plan - 02/12/15 1319    Clinical Impression Statement Pt. is a 54 year old female s/p L shoulder surgery after sustaining a fall on 12/26/14. Pt. underwent shoulder arthroscopy with subacromial decompression and distal clavicle excision and debridement. Pt presents with decreased ROM, increased pain with movement, and weakness due to pain.Pt. would benefit from skilled PT services in order to address  current impairments and return to painfree PLOF.   Pt will benefit from skilled therapeutic intervention in order to improve on the following deficits Decreased range of motion;Dizziness;Impaired UE functional use;Pain;Impaired flexibility;Decreased mobility;Decreased strength;Postural dysfunction   Rehab Potential Good   Clinical Impairments Affecting Rehab Potential --   PT Frequency 2x / week   PT Duration 8 weeks   PT Treatment/Interventions ADLs/Self Care Home Management;Cryotherapy;Moist Heat;Patient/family education;Passive range of motion;Therapeutic exercise;Taping;Electrical Stimulation;Ultrasound;Manual techniques;Therapeutic activities   PT Next Visit Plan PROM, AAROM, ultrasound, scapula stabilizing exercises, review HEP   PT Home Exercise Plan scapula retractions, cane supine shoulder flexion, cane seated shoulder flexion   Consulted and Agree with Plan of Care Patient         Problem List Patient Active  Problem List   Diagnosis Date Noted  . Lumbar radiculopathy, chronic 02/18/2014  . Hypertriglyceridemia 01/08/2014  . DM (diabetes mellitus) 07/12/2013  . Insomnia 07/12/2013  . Depression 07/12/2013  . Essential hypertension, benign 07/12/2013    Radonna Ricker 02/12/2015, 3:52 PM  [facility] [address] [place], [place], [address] Phone: [phone]   Fax:  [phone]

## 2015-02-12 NOTE — Telephone Encounter (Signed)
Returned patient phone call Patient not available Left message on voice mail to return our call 

## 2015-02-16 ENCOUNTER — Ambulatory Visit: Payer: Self-pay | Admitting: Podiatry

## 2015-02-16 ENCOUNTER — Telehealth: Payer: Self-pay

## 2015-02-16 MED ORDER — GABAPENTIN 300 MG PO CAPS: 900.0000 mg | ORAL_CAPSULE | Freq: Three times a day (TID) | ORAL | Status: DC

## 2015-02-16 MED ORDER — ACETAMINOPHEN 650 MG RE SUPP: 650.0000 mg | RECTAL | Status: DC | PRN

## 2015-02-16 NOTE — Telephone Encounter (Signed)
Patient returned phone call, please f/u °

## 2015-02-16 NOTE — Telephone Encounter (Signed)
Returned patient phone call Patient not available Left message on voice mail to return our call 

## 2015-02-16 NOTE — Telephone Encounter (Signed)
Patient returned phone call Requesting a refill on her gabapentin and tylenol  [facility]

## 2015-02-17 ENCOUNTER — Encounter: Payer: Self-pay | Admitting: Physical Therapy

## 2015-02-19 ENCOUNTER — Ambulatory Visit: Payer: Self-pay | Attending: Specialist | Admitting: Physical Therapy

## 2015-02-19 DIAGNOSIS — M7582 Other shoulder lesions, left shoulder: Secondary | ICD-10-CM | POA: Insufficient documentation

## 2015-02-19 DIAGNOSIS — M7581 Other shoulder lesions, right shoulder: Secondary | ICD-10-CM | POA: Insufficient documentation

## 2015-02-19 DIAGNOSIS — Z9889 Other specified postprocedural states: Secondary | ICD-10-CM | POA: Insufficient documentation

## 2015-02-19 DIAGNOSIS — T148 Other injury of unspecified body region: Secondary | ICD-10-CM | POA: Insufficient documentation

## 2015-02-19 DIAGNOSIS — M129 Arthropathy, unspecified: Secondary | ICD-10-CM | POA: Insufficient documentation

## 2015-02-19 DIAGNOSIS — M25512 Pain in left shoulder: Secondary | ICD-10-CM | POA: Insufficient documentation

## 2015-02-19 DIAGNOSIS — M25612 Stiffness of left shoulder, not elsewhere classified: Secondary | ICD-10-CM

## 2015-02-19 DIAGNOSIS — S40012A Contusion of left shoulder, initial encounter: Secondary | ICD-10-CM | POA: Insufficient documentation

## 2015-02-19 NOTE — Therapy (Signed)
[place], [place], [address] Phone: [phone]   Fax:  [phone]  Physical Therapy Treatment  Patient Details  Name: Briana Alvarez MRN: [id number] Date of Birth: 12/11/1960 Referring Provider:  Lorayne Marek, MD  Encounter Date: 02/19/2015      PT End of Session - 02/19/15 1135    Visit Number 4   Number of Visits 20   Date for PT Re-Evaluation 04/09/15   PT Start Time 1115  pt late   PT Stop Time 1145   PT Time Calculation (min) 30 min      Past Medical History  Diagnosis Date  . Diabetes mellitus   . Hypertension   . Depression   . Anxiety   . Pneumonia   . Headache     "2 a month"  . Neuropathy   . Arthritis     Past Surgical History  Procedure Laterality Date  . Abdominal hysterectomy    . Carpal tunnel release    . Ulner nerve     . Tonsillectomy    . Neck surgery    . Knee surgery Left   . Knee arthroscopy Right     There were no vitals filed for this visit.  Visit Diagnosis:  Pain in joint, shoulder region, left  S/P shoulder surgery  Decreased ROM of left shoulder  Shoulder contusion, left, initial encounter  Decreased range of motion of shoulder, left      Subjective Assessment - 02/19/15 1115    Subjective No c/o pain. Exercises are going well.   Currently in Pain? No/denies            [facility] PT Assessment - 02/19/15 1333    AROM   Left Shoulder Flexion 180 Degrees   Left Shoulder ABduction 180 Degrees   Strength   Overall Strength Deficits;Due to pain  shoulder flex/abduct 4/5 with pain IR/ER 4+                     [facility] Adult PT Treatment/Exercise - 02/19/15 1117    Exercises   Exercises Shoulder   Shoulder Exercises: Seated   Retraction Strengthening;Both;10 reps   Theraband Level (Shoulder Retraction) Level 1 (Yellow)   External Rotation Strengthening;Both;12 reps;Theraband   Theraband Level (Shoulder External Rotation) Level 1  (Yellow)   Flexion AAROM;10 reps   Abduction Strengthening;Both;10 reps;Theraband   Theraband Level (Shoulder ABduction) Level 1 (Yellow)  2 sets   Other Seated Exercises D2, yellow band 1 set x10/bilat   Other Seated Exercises AEROBIC, UE arm bike, 73mn both arms forward, 4 min rt. arm only backwards,empahsis on posture  verbal cue to decrease speed,    Shoulder Exercises: Isometric Strengthening   Flexion Other (comment)   Flexion Limitations yellow ball, 10sets 2 sec hold   Extension Other (comment)   Extension Limitations yellow ball, 10 reps 2 sec hold   External Rotation Other (comment)   External Rotation Limitations standing, yellow ball, 10 reps, 2 sec hol   Internal Rotation Other (comment)   Internal Rotation Limitations standing, yellow ball on wall, 10 reps 2 sec hold   ABduction Other (comment)   ABduction Limitations standing, yellow ball 10 reps, 2 sec hold   Shoulder Exercises: Stretch   Other Shoulder Stretches seated, arm straight extend behind for anterior chest stretch x15 sec hold                PT Education - 02/19/15 1135  Education provided Yes   Education Details Exercise form correction, importance of good posture during exercises, HEP, no horizontal adduction stretching   Person(s) Educated Patient   Methods Explanation   Comprehension Verbalized understanding          PT Short Term Goals - 02/19/15 1327    PT SHORT TERM GOAL #1   Title Pt will be I with initial HEP   Time 2   Period Weeks   Status Achieved   PT SHORT TERM GOAL #2   Title Pt will demonstrate and verbalize understanding of condition management including ice/heat, positioning for comfort   Time 2   Period Weeks   Status Achieved   PT SHORT TERM GOAL #3   Title Pt. will demo full AROM in supine into shoulder flexion and abduction    Baseline assessed in standing not supine, Full ROM in standing   Time 4   Period Weeks   Status On-going           PT Long Term  Goals - 02/19/15 1328    PT LONG TERM GOAL #1   Title Pt. will be I with advanced HEP   Time 8   Period Weeks   Status On-going   PT LONG TERM GOAL #2   Title Pt. will report 50% reduction in  pain with shoulder abduction movement in order to wash her hair   Time 6   Period Weeks   Status Achieved   PT LONG TERM GOAL #3   Title Pt. will demo improved strength to 4+/5  without pain (shoulder flexion and abduction)   Baseline 4/5 with pain   Time 6   Period Weeks   Status On-going   PT LONG TERM GOAL #4   Title pt. will report improvement in ability to lift things such as her purse without increased pain (once lifting restrictions are lifted)   Time 8   Period Weeks   Status On-going   PT LONG TERM GOAL #5   Title Pt will demo increased AROM in sitting shoulder flexion from 50 to 110 degrees in order to perform functional activities like reaching into cabinents overhead wthout pain.   Baseline now has Full ROM    Time 8   Period Weeks   Status Achieved               Plan - 02/19/15 1324    Clinical Impression Statement Pt. arrived a few minutes late to appointment. She has made excellent gains since last session, Full ROM in shoulder flexion and abduction in standing, improved strength in standing still with some pain present. Pt has been complaint with HEP.  Pt has MET STG #1 and 2 and LTG #2 and #5   PT Next Visit Plan progress based on protocol, no horizontal adduction, UE arm bike, ultrasound if needed, biofreeze at end of session   PT Home Exercise Plan isometrics (flexion, ER/IR, extension)   Consulted and Agree with Plan of Care Patient        Problem List Patient Active Problem List   Diagnosis Date Noted  . Lumbar radiculopathy, chronic 02/18/2014  . Hypertriglyceridemia 01/08/2014  . DM (diabetes mellitus) 07/12/2013  . Insomnia 07/12/2013  . Depression 07/12/2013  . Essential hypertension, benign 07/12/2013    PAA,JENNIFER 02/19/2015, 1:59 PM  [facility] [address] [place], [place], [address] Phone: [phone]   Fax:  [phone]   Raeford Razor, PT 02/19/2015 2:00 PM  Phone: [phone] Fax: [phone]

## 2015-02-24 ENCOUNTER — Ambulatory Visit: Payer: Self-pay | Admitting: Physical Therapy

## 2015-02-24 DIAGNOSIS — M25512 Pain in left shoulder: Secondary | ICD-10-CM

## 2015-02-24 DIAGNOSIS — Z9889 Other specified postprocedural states: Secondary | ICD-10-CM

## 2015-02-24 DIAGNOSIS — M25612 Stiffness of left shoulder, not elsewhere classified: Secondary | ICD-10-CM

## 2015-02-24 NOTE — Therapy (Signed)
[place], [place], [address] Phone: [phone]   Fax:  [phone]  Physical Therapy Treatment  Patient Details  Name: Briana Alvarez MRN: [id number] Date of Birth: Jul 14, 1960 Referring Provider:  Lorayne Marek, MD  Encounter Date: 02/24/2015      PT End of Session - 02/24/15 1249    Visit Number 5   Number of Visits 20   Date for PT Re-Evaluation 04/09/15   PT Start Time 1105   PT Stop Time 1152   PT Time Calculation (min) 47 min   Activity Tolerance Patient tolerated treatment well   Behavior During Therapy [facility] for tasks assessed/performed      Past Medical History  Diagnosis Date  . Diabetes mellitus   . Hypertension   . Depression   . Anxiety   . Pneumonia   . Headache     "2 a month"  . Neuropathy   . Arthritis     Past Surgical History  Procedure Laterality Date  . Abdominal hysterectomy    . Carpal tunnel release    . Ulner nerve     . Tonsillectomy    . Neck surgery    . Knee surgery Left   . Knee arthroscopy Right     There were no vitals filed for this visit.  Visit Diagnosis:  Pain in joint, shoulder region, left  S/P shoulder surgery  Decreased ROM of left shoulder  Decreased range of motion of shoulder, left      Subjective Assessment - 02/24/15 1114    Subjective Pt returns to doctor on the 21st of this month. She is in some pain today stated she rearranged her home which involved heavy lifting "i know I wasnt suppose to do it. I also did some deep cleaning around the house".   Currently in Pain? Yes   Pain Score 5    Pain Location Shoulder   Pain Orientation Left   Pain Descriptors / Indicators Aching   Aggravating Factors  cleaning house   Pain Relieving Factors ice             [facility] Adult PT Treatment/Exercise - 02/24/15 1117    Shoulder Exercises: Seated   Retraction Strengthening;Both;12 reps   Horizontal ABduction Strengthening;Both;12  reps;Theraband   External Rotation Strengthening;Both;12 reps;Theraband   Theraband Level (Shoulder External Rotation) Level 1 (Yellow)   Other Seated Exercises shoulder shrugs x12 vc for neck in neutral, good posture   Shoulder Exercises: Isometric Strengthening   ADduction 5X5"   Modalities   Modalities Electrical Stimulation   Electrical Stimulation   Electrical Stimulation Location L shoulder superior and lateral deltoid   Electrical Stimulation Action IFC   Electrical Stimulation Parameters 1.5 watts/cm2   Electrical Stimulation Goals Pain   Manual Therapy   Manual Therapy Soft tissue mobilization   Manual therapy comments 53min   Soft tissue mobilization prone, trap, lateral deltoid           PT Education - 02/24/15 1249    Education provided Yes   Education Details continue with current HEP due to increased pain   Person(s) Educated Patient   Methods Explanation   Comprehension Verbalized understanding          PT Short Term Goals - 02/19/15 1327    PT SHORT TERM GOAL #1   Title Pt will be I with initial HEP   Time 2   Period Weeks   Status Achieved   PT SHORT TERM GOAL #  2   Title Pt will demonstrate and verbalize understanding of condition management including ice/heat, positioning for comfort   Time 2   Period Weeks   Status Achieved   PT SHORT TERM GOAL #3   Title Pt. will demo full AROM in supine into shoulder flexion and abduction    Baseline assessed in standing not supine, Full ROM in standing   Time 4   Period Weeks   Status On-going           PT Long Term Goals - 02/19/15 1328    PT LONG TERM GOAL #1   Title Pt. will be I with advanced HEP   Time 8   Period Weeks   Status On-going   PT LONG TERM GOAL #2   Title Pt. will report 50% reduction in  pain with shoulder abduction movement in order to wash her hair   Time 6   Period Weeks   Status Achieved   PT LONG TERM GOAL #3   Title Pt. will demo improved strength to 4+/5  without pain  (shoulder flexion and abduction)   Baseline 4/5 with pain   Time 6   Period Weeks   Status On-going   PT LONG TERM GOAL #4   Title pt. will report improvement in ability to lift things such as her purse without increased pain (once lifting restrictions are lifted)   Time 8   Period Weeks   Status On-going   PT LONG TERM GOAL #5   Title Pt will demo increased AROM in sitting shoulder flexion from 50 to 110 degrees in order to perform functional activities like reaching into cabinents overhead wthout pain.   Baseline now has Full ROM    Time 8   Period Weeks   Status Achieved               Plan - 02/24/15 1250    Clinical Impression Statement Pt has had a set back due to rearranging her home saturday increasing pain in her shoulder. Minimal exercises performed today focus on pain management. Pt will benefit from skilled PT services to decrease pain and continue working towards achieving all goals.   PT Next Visit Plan assess goals, UE arm bike, yellow theraband strength exercises sitting or standing, manual to L shoulder emphasis on upper trap, biofreeze at end of session   PT Home Exercise Plan no new exercises given at this time   Consulted and Agree with Plan of Care Patient        Problem List Patient Active Problem List   Diagnosis Date Noted  . Lumbar radiculopathy, chronic 02/18/2014  . Hypertriglyceridemia 01/08/2014  . DM (diabetes mellitus) 07/12/2013  . Insomnia 07/12/2013  . Depression 07/12/2013  . Essential hypertension, benign 07/12/2013  Radonna Ricker, SPT   PAA,JENNIFER 02/24/2015, 1:15 PM  [facility] [address] [place], [place], [address] Phone: [phone]   Fax:  [phone]    Raeford Razor, PT 02/24/2015 1:15 PM Phone: [phone] Fax: [phone]

## 2015-02-26 ENCOUNTER — Ambulatory Visit: Payer: Self-pay | Admitting: Physical Therapy

## 2015-02-26 DIAGNOSIS — Z9889 Other specified postprocedural states: Secondary | ICD-10-CM

## 2015-02-26 DIAGNOSIS — M25512 Pain in left shoulder: Secondary | ICD-10-CM

## 2015-02-26 DIAGNOSIS — M25612 Stiffness of left shoulder, not elsewhere classified: Secondary | ICD-10-CM

## 2015-02-26 DIAGNOSIS — S40012A Contusion of left shoulder, initial encounter: Secondary | ICD-10-CM

## 2015-02-26 DIAGNOSIS — T07XXXA Unspecified multiple injuries, initial encounter: Secondary | ICD-10-CM

## 2015-02-26 DIAGNOSIS — M19012 Primary osteoarthritis, left shoulder: Secondary | ICD-10-CM

## 2015-02-26 NOTE — Therapy (Signed)
[place], [place], [address] Phone: [phone]   Fax:  [phone]  Physical Therapy Treatment  Patient Details  Name: Briana Alvarez MRN: [id number] Date of Birth: October 09, 1960 Referring Provider:  Lorayne Marek, MD  Encounter Date: 02/26/2015      PT End of Session - 02/26/15 1210    Visit Number 6   Number of Visits 20   Date for PT Re-Evaluation 04/09/15   PT Start Time 1107   PT Stop Time 1145   PT Time Calculation (min) 38 min      Past Medical History  Diagnosis Date  . Diabetes mellitus   . Hypertension   . Depression   . Anxiety   . Pneumonia   . Headache     "2 a month"  . Neuropathy   . Arthritis     Past Surgical History  Procedure Laterality Date  . Abdominal hysterectomy    . Carpal tunnel release    . Ulner nerve     . Tonsillectomy    . Neck surgery    . Knee surgery Left   . Knee arthroscopy Right     There were no vitals filed for this visit.  Visit Diagnosis:  Pain in joint, shoulder region, left  S/P shoulder surgery  Decreased ROM of left shoulder  Decreased range of motion of shoulder, left  Shoulder contusion, left, initial encounter  Arthritis of left shoulder region  Strains of multiple ligaments or muscles      Subjective Assessment - 02/26/15 1109    Subjective No pain now, upper trap hurts with movement.    Currently in Pain? No/denies                         [facility] Adult PT Treatment/Exercise - 02/26/15 1115    Shoulder Exercises: Seated   External Rotation --   Theraband Level (Shoulder External Rotation) --   Shoulder Exercises: Standing   External Rotation Left;15 reps;Theraband   Theraband Level (Shoulder External Rotation) Level 1 (Yellow)   Internal Rotation Left;15 reps;Theraband   Theraband Level (Shoulder Internal Rotation) Level 1 (Yellow)   Flexion Left;15 reps;Theraband   Theraband Level (Shoulder Flexion)  Level 1 (Yellow)  punch   Extension Both;15 reps;Theraband   Theraband Level (Shoulder Extension) Level 1 (Yellow)   Row Both;15 reps;Theraband   Theraband Level (Shoulder Row) Level 1 (Yellow)   Shoulder Exercises: ROM/Strengthening   UBE (Upper Arm Bike) Level 1.5 3 min forward 3 min back   Ultrasound   Ultrasound Location left upper trap, perdiscap   Ultrasound Parameters 100% 1.4w/cm2 x 8 min   Ultrasound Goals Pain                PT Education - 02/26/15 1209    Education Details yellow band HEP-scap pulls and rockwood-see pt instructions-verbal cues to stop if painful   Person(s) Educated Patient   Methods Explanation;Handout   Comprehension Verbalized understanding          PT Short Term Goals - 02/19/15 1327    PT SHORT TERM GOAL #1   Title Pt will be I with initial HEP   Time 2   Period Weeks   Status Achieved   PT SHORT TERM GOAL #2   Title Pt will demonstrate and verbalize understanding of condition management including ice/heat, positioning for comfort   Time 2   Period Weeks   Status Achieved  PT SHORT TERM GOAL #3   Title Pt. will demo full AROM in supine into shoulder flexion and abduction    Baseline assessed in standing not supine, Full ROM in standing   Time 4   Period Weeks   Status On-going           PT Long Term Goals - 02/26/15 1111    PT LONG TERM GOAL #1   Title Pt. will be I with advanced HEP   Time 8   Status On-going   PT LONG TERM GOAL #2   Title Pt. will report 50% reduction in  pain with shoulder abduction movement in order to wash her hair   Time 6   Period Weeks   Status Achieved   PT LONG TERM GOAL #3   Title Pt. will demo improved strength to 4+/5  without pain (shoulder flexion and abduction)   Baseline 4+/5 flexion and abduction with minimal pain   Time 6   Period Weeks   Status Partially Met   PT LONG TERM GOAL #4   Title pt. will report improvement in ability to lift things such as her purse without  increased pain (once lifting restrictions are lifted)   Baseline lifting restrictions have not been lifted however she still lifts her purse with LUE and no pain   Time 8   Period Weeks   Status Partially Met   PT LONG TERM GOAL #5   Title Pt will demo increased AROM in sitting shoulder flexion from 50 to 110 degrees in order to perform functional activities like reaching into cabinents overhead wthout pain.   Baseline now has Full ROM    Time 8   Period Weeks   Status Achieved               Plan - 02/26/15 1212    Clinical Impression Statement Pt pain decreased to 0/10 today. Instructed pt in yellow band scapular and shoulder strength. She sees her MD this month to discuss right shoulder surgery. No increased painwith exercises today. Added to HEP and instructed to stop id painful at home. Trial of ultrasound with biofreeze to left upper trap and periscap with pt reporting decreased tightness after.    PT Next Visit Plan Review yellow band HEP, left upper trap manual,ultrasound biofreeze        Problem List Patient Active Problem List   Diagnosis Date Noted  . Lumbar radiculopathy, chronic 02/18/2014  . Hypertriglyceridemia 01/08/2014  . DM (diabetes mellitus) 07/12/2013  . Insomnia 07/12/2013  . Depression 07/12/2013  . Essential hypertension, benign 07/12/2013    Dorene Ar, PTA 02/26/2015, 12:16 PM  [facility] [address] [place], [place], [address] Phone: [phone]   Fax:  [phone]

## 2015-02-26 NOTE — Patient Instructions (Signed)
EXTENSION: Standing - Resistance Band: Stable (Active)   Stand, right arm at side. Against yellow resistance band, draw arm backward, as far as possible, keeping elbow straight. Complete __2_ sets of _15__ repetitions. Perform 2___ sessions per day.   Copyright  VHI. All rights reserved.  Resistive Band Rowing   With resistive band anchored in door, grasp both ends. Keeping elbows bent, pull back, squeezing shoulder blades together. Hold ___51_ seconds. Repeat __15__ times. Do 2____ sessions per day.  http://[email or web address].[email or web address]   Copyright  VHI. All rights reserved.  Strengthening: Resisted Internal Rotation   Hold tubing in left hand, elbow at side and forearm out. Rotate forearm in across body. Repeat ___15_ times per set. Do _2___ sets per session. Do 2____ sessions per day.  http://orth.exer.us/830   Copyright  VHI. All rights reserved.  Strengthening: Resisted External Rotation   Hold tubing in right hand, elbow at side and forearm across body. Rotate forearm out. Repeat _15___ times per set. Do _2___ sets per session. Do _2___ sessions per day.  http://orth.exer.us/828   Copyright  VHI. All rights reserved.  Strengthening: Resisted Flexion   Hold tubing with left arm at side. Pull forward and up. Move shoulder through pain-free range of motion. Repeat __15__ times per set. Do __2__ sets per session. Do __2__ sessions per day.  http://orth.exer.us/824   Copyright  VHI. All rights reserved.

## 2015-03-03 ENCOUNTER — Ambulatory Visit: Payer: Self-pay | Admitting: Physical Therapy

## 2015-03-04 ENCOUNTER — Telehealth: Payer: Self-pay | Admitting: Internal Medicine

## 2015-03-04 NOTE — Telephone Encounter (Signed)
Patients needs further instruction on how to take her medicine acetaminophen (TYLENOL) suppository. Please follow up.

## 2015-03-05 ENCOUNTER — Ambulatory Visit: Payer: Self-pay | Admitting: Physical Therapy

## 2015-03-06 NOTE — Telephone Encounter (Signed)
Patient called requesting a med refill on Tylenol # 3. Please f/u

## 2015-03-09 ENCOUNTER — Encounter (HOSPITAL_COMMUNITY): Payer: Self-pay

## 2015-03-09 ENCOUNTER — Emergency Department (INDEPENDENT_AMBULATORY_CARE_PROVIDER_SITE_OTHER)
Admission: EM | Admit: 2015-03-09 | Discharge: 2015-03-09 | Disposition: A | Discharge status: Home / Self Care | Payer: Self-pay | Source: Home / Self Care | Attending: Family Medicine | Admitting: Family Medicine

## 2015-03-09 DIAGNOSIS — T148XXA Other injury of unspecified body region, initial encounter: Secondary | ICD-10-CM

## 2015-03-09 DIAGNOSIS — S338XXA Sprain of other parts of lumbar spine and pelvis, initial encounter: Secondary | ICD-10-CM

## 2015-03-09 DIAGNOSIS — M545 Low back pain, unspecified: Secondary | ICD-10-CM

## 2015-03-09 DIAGNOSIS — L259 Unspecified contact dermatitis, unspecified cause: Secondary | ICD-10-CM

## 2015-03-09 DIAGNOSIS — B372 Candidiasis of skin and nail: Secondary | ICD-10-CM

## 2015-03-09 DIAGNOSIS — S39012A Strain of muscle, fascia and tendon of lower back, initial encounter: Secondary | ICD-10-CM

## 2015-03-09 DIAGNOSIS — T148 Other injury of unspecified body region: Secondary | ICD-10-CM

## 2015-03-09 MED ORDER — TRIAMCINOLONE ACETONIDE 0.1 % EX CREA: 1.0000 "application " | TOPICAL_CREAM | Freq: Two times a day (BID) | CUTANEOUS | Status: DC

## 2015-03-09 MED ORDER — KETOROLAC TROMETHAMINE 30 MG/ML IJ SOLN
INTRAMUSCULAR | Status: AC
  Filled 2015-03-09: qty 1

## 2015-03-09 MED ORDER — HYDROCODONE-ACETAMINOPHEN 5-325 MG PO TABS: 1.0000 | ORAL_TABLET | ORAL | Status: DC | PRN

## 2015-03-09 MED ORDER — CLOTRIMAZOLE-BETAMETHASONE 1-0.05 % EX CREA: TOPICAL_CREAM | CUTANEOUS | Status: DC

## 2015-03-09 MED ORDER — DICLOFENAC POTASSIUM 50 MG PO TABS: 50.0000 mg | ORAL_TABLET | Freq: Three times a day (TID) | ORAL | Status: DC

## 2015-03-09 MED ORDER — KETOROLAC TROMETHAMINE 30 MG/ML IJ SOLN
30.0000 mg | Freq: Once | INTRAMUSCULAR | Status: AC
  Administered 2015-03-09: 30 mg via INTRAMUSCULAR

## 2015-03-09 NOTE — Discharge Instructions (Signed)
Back Pain, Adult °Low back pain is very common. About 1 in 5 people have back pain. The cause of low back pain is rarely dangerous. The pain often gets better over time. About half of people with a sudden onset of back pain feel better in just 2 weeks. About 8 in 10 people feel better by 6 weeks.  °CAUSES °Some common causes of back pain include: °· Strain of the muscles or ligaments supporting the spine. °· Wear and tear (degeneration) of the spinal discs. °· Arthritis. °· Direct injury to the back. °DIAGNOSIS °Most of the time, the direct cause of low back pain is not known. However, back pain can be treated effectively even when the exact cause of the pain is unknown. Answering your caregiver's questions about your overall health and symptoms is one of the most accurate ways to make sure the cause of your pain is not dangerous. If your caregiver needs more information, he or she may order lab work or imaging tests (X-rays or MRIs). However, even if imaging tests show changes in your back, this usually does not require surgery. °HOME CARE INSTRUCTIONS °For many people, back pain returns. Since low back pain is rarely dangerous, it is often a condition that people can learn to manage on their own.  °· Remain active. It is stressful on the back to sit or stand in one place. Do not sit, drive, or stand in one place for more than 30 minutes at a time. Take short walks on level surfaces as soon as pain allows. Try to increase the length of time you walk each day. °· Do not stay in bed. Resting more than 1 or 2 days can delay your recovery. °· Do not avoid exercise or work. Your body is made to move. It is not dangerous to be active, even though your back may hurt. Your back will likely heal faster if you return to being active before your pain is gone. °· Pay attention to your body when you  bend and lift. Many people have less discomfort when lifting if they bend their knees, keep the load close to their bodies, and  avoid twisting. Often, the most comfortable positions are those that put less stress on your recovering back. °· Find a comfortable position to sleep. Use a firm mattress and lie on your side with your knees slightly bent. If you lie on your back, put a pillow under your knees. °· Only take over-the-counter or prescription medicines as directed by your caregiver. Over-the-counter medicines to reduce pain and inflammation are often the most helpful. Your caregiver may prescribe muscle relaxant drugs. These medicines help dull your pain so you can more quickly return to your normal activities and healthy exercise. °· Put ice on the injured area. °¨ Put ice in a plastic bag. °¨ Place a towel between your skin and the bag. °¨ Leave the ice on for 15-20 minutes, 03-04 times a day for the first 2 to 3 days. After that, ice and heat may be alternated to reduce pain and spasms. °· Ask your caregiver about trying back exercises and gentle massage. This may be of some benefit. °· Avoid feeling anxious or stressed. Stress increases muscle tension and can worsen back pain. It is important to recognize when you are anxious or stressed and learn ways to manage it. Exercise is a great option. °SEEK MEDICAL CARE IF: °· You have pain that is not relieved with rest or medicine. °· You have pain that does not improve in 1 week. °· You have new symptoms. °· You are generally not feeling well. °SEEK   IMMEDIATE MEDICAL CARE IF:   You have pain that radiates from your back into your legs.  You develop new bowel or bladder control problems.  You have unusual weakness or numbness in your arms or legs.  You develop nausea or vomiting.  You develop abdominal pain.  You feel faint. Document Released: 06/06/2005 Document Revised: 12/06/2011 Document Reviewed: 10/08/2013 [facility] Patient Information 2015 [place], [place]. This information is not intended to replace advice given to you by your health care provider. Make sure you  discuss any questions you have with your health care provider.  Candida Infection A Candida infection (also called yeast, fungus, and Monilia infection) is an overgrowth of yeast that can occur anywhere on the body. A yeast infection commonly occurs in warm, moist body areas. Usually, the infection remains localized but can spread to become a systemic infection. A yeast infection may be a sign of a more severe disease such as diabetes, leukemia, or AIDS. A yeast infection can occur in both men and women. In women, Candida vaginitis is a vaginal infection. It is one of the most common causes of vaginitis. Men usually do not have symptoms or know they have an infection until other problems develop. Men may find out they have a yeast infection because their sex partner has a yeast infection. Uncircumcised men are more likely to get a yeast infection than circumcised men. This is because the uncircumcised glans is not exposed to air and does not remain as dry as that of a circumcised glans. Older adults may develop yeast infections around dentures. CAUSES  Women  Antibiotics.  Steroid medication taken for a long time.  Being overweight (obese).  Diabetes.  Poor immune condition.  Certain serious medical conditions.  Immune suppressive medications for organ transplant patients.  Chemotherapy.  Pregnancy.  Menstruation.  Stress and fatigue.  Intravenous drug use.  Oral contraceptives.  Wearing tight-fitting clothes in the crotch area.  Catching it from a sex partner who has a yeast infection.  Spermicide.  Intravenous, urinary, or other catheters. Men  Catching it from a sex partner who has a yeast infection.  Having oral or anal sex with a person who has the infection.  Spermicide.  Diabetes.  Antibiotics.  Poor immune system.  Medications that suppress the immune system.  Intravenous drug use.  Intravenous, urinary, or other catheters. SYMPTOMS   Women  Thick, white vaginal discharge.  Vaginal itching.  Redness and swelling in and around the vagina.  Irritation of the lips of the vagina and perineum.  Blisters on the vaginal lips and perineum.  Painful sexual intercourse.  Low blood sugar (hypoglycemia).  Painful urination.  Bladder infections.  Intestinal problems such as constipation, indigestion, bad breath, bloating, increase in gas, diarrhea, or loose stools. Men  Men may develop intestinal problems such as constipation, indigestion, bad breath, bloating, increase in gas, diarrhea, or loose stools.  Dry, cracked skin on the penis with itching or discomfort.  Jock itch.  Dry, flaky skin.  Athlete's foot.  Hypoglycemia. DIAGNOSIS  Women  A history and an exam are performed.  The discharge may be examined under a microscope.  A culture may be taken of the discharge. Men  A history and an exam are performed.  Any discharge from the penis or areas of cracked skin will be looked at under the microscope and cultured.  Stool samples may be cultured. TREATMENT  Women  Vaginal antifungal suppositories and creams.  Medicated creams to decrease irritation and itching on  the outside of the vagina.  Warm compresses to the perineal area to decrease swelling and discomfort.  Oral antifungal medications.  Medicated vaginal suppositories or cream for repeated or recurrent infections.  Wash and dry the irritation areas before applying the cream.  Eating yogurt with Lactobacillus may help with prevention and treatment.  Sometimes painting the vagina with gentian violet solution may help if creams and suppositories do not work. Men  Antifungal creams and oral antifungal medications.  Sometimes treatment must continue for 30 days after the symptoms go away to prevent recurrence. HOME CARE INSTRUCTIONS  Women  Use cotton underwear and avoid tight-fitting clothing.  Avoid colored, scented toilet  paper and deodorant tampons or pads.  Do not douche.  Keep your diabetes under control.  Finish all the prescribed medications.  Keep your skin clean and dry.  Consume milk or yogurt with Lactobacillus-active culture regularly. If you get frequent yeast infections and think that is what the infection is, there are over-the-counter medications that you can get. If the infection does not show healing in 3 days, talk to your caregiver.  Tell your sex partner you have a yeast infection. Your partner may need treatment also, especially if your infection does not clear up or recurs. Men  Keep your skin clean and dry.  Keep your diabetes under control.  Finish all prescribed medications.  Tell your sex partner that you have a yeast infection so he or she can be treated if necessary. SEEK MEDICAL CARE IF:   Your symptoms do not clear up or worsen in one week after treatment.  You have an oral temperature above 102 F (38.9 C).  You have trouble swallowing or eating for a prolonged time.  You develop blisters on and around your vagina.  You develop vaginal bleeding and it is not your menstrual period.  You develop abdominal pain.  You develop intestinal problems as mentioned above.  You get weak or light-headed.  You have painful or increased urination.  You have pain during sexual intercourse. MAKE SURE YOU:   Understand these instructions.  Will watch your condition.  Will get help right away if you are not doing well or get worse. Document Released: 07/14/2004 Document Revised: 10/21/2013 Document Reviewed: 10/26/2009 [facility] Patient Information 2015 [place], [place]. This information is not intended to replace advice given to you by your health care provider. Make sure you discuss any questions you have with your health care provider.  Contact Dermatitis Contact dermatitis is a reaction to certain substances that touch the skin. Contact dermatitis can be either  irritant contact dermatitis or allergic contact dermatitis. Irritant contact dermatitis does not require previous exposure to the substance for a reaction to occur.Allergic contact dermatitis only occurs if you have been exposed to the substance before. Upon a repeat exposure, your body reacts to the substance.  CAUSES  Many substances can cause contact dermatitis. Irritant dermatitis is most commonly caused by repeated exposure to mildly irritating substances, such as:  Makeup.  Soaps.  Detergents.  Bleaches.  Acids.  Metal salts, such as nickel. Allergic contact dermatitis is most commonly caused by exposure to:  Poisonous plants.  Chemicals (deodorants, shampoos).  Jewelry.  Latex.  Neomycin in triple antibiotic cream.  Preservatives in products, including clothing. SYMPTOMS  The area of skin that is exposed may develop:  Dryness or flaking.  Redness.  Cracks.  Itching.  Pain or a burning sensation.  Blisters. With allergic contact dermatitis, there may also be swelling in  areas such as the eyelids, mouth, or genitals.  DIAGNOSIS  Your caregiver can usually tell what the problem is by doing a physical exam. In cases where the cause is uncertain and an allergic contact dermatitis is suspected, a patch skin test may be performed to help determine the cause of your dermatitis. TREATMENT Treatment includes protecting the skin from further contact with the irritating substance by avoiding that substance if possible. Barrier creams, powders, and gloves may be helpful. Your caregiver may also recommend:  Steroid creams or ointments applied 2 times daily. For best results, soak the rash area in cool water for 20 minutes. Then apply the medicine. Cover the area with a plastic wrap. You can store the steroid cream in the refrigerator for a "chilly" effect on your rash. That may decrease itching. Oral steroid medicines may be needed in more severe cases.  Antibiotics or  antibacterial ointments if a skin infection is present.  Antihistamine lotion or an antihistamine taken by mouth to ease itching.  Lubricants to keep moisture in your skin.  Burow's solution to reduce redness and soreness or to dry a weeping rash. Mix one packet or tablet of solution in 2 cups cool water. Dip a clean washcloth in the mixture, wring it out a bit, and put it on the affected area. Leave the cloth in place for 30 minutes. Do this as often as possible throughout the day.  Taking several cornstarch or baking soda baths daily if the area is too large to cover with a washcloth. Harsh chemicals, such as alkalis or acids, can cause skin damage that is like a burn. You should flush your skin for 15 to 20 minutes with cold water after such an exposure. You should also seek immediate medical care after exposure. Bandages (dressings), antibiotics, and pain medicine may be needed for severely irritated skin.  HOME CARE INSTRUCTIONS  Avoid the substance that caused your reaction.  Keep the area of skin that is affected away from hot water, soap, sunlight, chemicals, acidic substances, or anything else that would irritate your skin.  Do not scratch the rash. Scratching may cause the rash to become infected.  You may take cool baths to help stop the itching.  Only take over-the-counter or prescription medicines as directed by your caregiver.  See your caregiver for follow-up care as directed to make sure your skin is healing properly. SEEK MEDICAL CARE IF:   Your condition is not better after 3 days of treatment.  You seem to be getting worse.  You see signs of infection such as swelling, tenderness, redness, soreness, or warmth in the affected area.  You have any problems related to your medicines. Document Released: 06/03/2000 Document Revised: 08/29/2011 Document Reviewed: 11/09/2010 [facility] Patient Information 2015 [place], [place]. This information is not intended to replace  advice given to you by your health care provider. Make sure you discuss any questions you have with your health care provider.  Muscle Strain A muscle strain is an injury that occurs when a muscle is stretched beyond its normal length. Usually a small number of muscle fibers are torn when this happens. Muscle strain is rated in degrees. First-degree strains have the least amount of muscle fiber tearing and pain. Second-degree and third-degree strains have increasingly more tearing and pain.  Usually, recovery from muscle strain takes 1-2 weeks. Complete healing takes 5-6 weeks.  CAUSES  Muscle strain happens when a sudden, violent force placed on a muscle stretches it too far. This may  occur with lifting, sports, or a fall.  RISK FACTORS Muscle strain is especially common in athletes.  SIGNS AND SYMPTOMS At the site of the muscle strain, there may be:  Pain.  Bruising.  Swelling.  Difficulty using the muscle due to pain or lack of normal function. DIAGNOSIS  Your health care provider will perform a physical exam and ask about your medical history. TREATMENT  Often, the best treatment for a muscle strain is resting, icing, and applying cold compresses to the injured area.  HOME CARE INSTRUCTIONS   Use the PRICE method of treatment to promote muscle healing during the first 2-3 days after your injury. The PRICE method involves:  Protecting the muscle from being injured again.  Restricting your activity and resting the injured body part.  Icing your injury. To do this, put ice in a plastic bag. Place a towel between your skin and the bag. Then, apply the ice and leave it on from 15-20 minutes each hour. After the third day, switch to moist heat packs.  Apply compression to the injured area with a splint or elastic bandage. Be careful not to wrap it too tightly. This may interfere with blood circulation or increase swelling.  Elevate the injured body part above the level of your heart  as often as you can.  Only take over-the-counter or prescription medicines for pain, discomfort, or fever as directed by your health care provider.  Warming up prior to exercise helps to prevent future muscle strains. SEEK MEDICAL CARE IF:   You have increasing pain or swelling in the injured area.  You have numbness, tingling, or a significant loss of strength in the injured area. MAKE SURE YOU:   Understand these instructions.  Will watch your condition.  Will get help right away if you are not doing well or get worse. Document Released: 06/06/2005 Document Revised: 03/27/2013 Document Reviewed: 01/03/2013 [facility] Patient Information 2015 [place], [place]. This information is not intended to replace advice given to you by your health care provider. Make sure you discuss any questions you have with your health care provider.  Lumbosacral Strain Lumbosacral strain is a strain of any of the parts that make up your lumbosacral vertebrae. Your lumbosacral vertebrae are the bones that make up the lower third of your backbone. Your lumbosacral vertebrae are held together by muscles and tough, fibrous tissue (ligaments).  CAUSES  A sudden blow to your back can cause lumbosacral strain. Also, anything that causes an excessive stretch of the muscles in the low back can cause this strain. This is typically seen when people exert themselves strenuously, fall, lift heavy objects, bend, or crouch repeatedly. RISK FACTORS  Physically demanding work.  Participation in pushing or pulling sports or sports that require a sudden twist of the back (tennis, golf, baseball).  Weight lifting.  Excessive lower back curvature.  Forward-tilted pelvis.  Weak back or abdominal muscles or both.  Tight hamstrings. SIGNS AND SYMPTOMS  Lumbosacral strain may cause pain in the area of your injury or pain that moves (radiates) down your leg.  DIAGNOSIS Your health care provider can often diagnose  lumbosacral strain through a physical exam. In some cases, you may need tests such as X-ray exams.  TREATMENT  Treatment for your lower back injury depends on many factors that your clinician will have to evaluate. However, most treatment will include the use of anti-inflammatory medicines. HOME CARE INSTRUCTIONS   Avoid hard physical activities (tennis, racquetball, waterskiing) if you are not in  proper physical condition for it. This may aggravate or create problems.  If you have a back problem, avoid sports requiring sudden body movements. Swimming and walking are generally safer activities.  Maintain good posture.  Maintain a healthy weight.  For acute conditions, you may put ice on the injured area.  Put ice in a plastic bag.  Place a towel between your skin and the bag.  Leave the ice on for 20 minutes, 2-3 times a day.  When the low back starts healing, stretching and strengthening exercises may be recommended. SEEK MEDICAL CARE IF:  Your back pain is getting worse.  You experience severe back pain not relieved with medicines. SEEK IMMEDIATE MEDICAL CARE IF:   You have numbness, tingling, weakness, or problems with the use of your arms or legs.  There is a change in bowel or bladder control.  You have increasing pain in any area of the body, including your belly (abdomen).  You notice shortness of breath, dizziness, or feel faint.  You feel sick to your stomach (nauseous), are throwing up (vomiting), or become sweaty.  You notice discoloration of your toes or legs, or your feet get very cold. MAKE SURE YOU:   Understand these instructions.  Will watch your condition.  Will get help right away if you are not doing well or get worse. Document Released: 03/16/2005 Document Revised: 06/11/2013 Document Reviewed: 01/23/2013 [facility] Patient Information 2015 [place], [place]. This information is not intended to replace advice given to you by your health care provider.  Make sure you discuss any questions you have with your health care provider.

## 2015-03-09 NOTE — ED Notes (Signed)
Rash x 1 week, back pain x couple of days

## 2015-03-09 NOTE — ED Provider Notes (Signed)
CSN: [id number]     Arrival date & time 03/09/15  1939 History   First MD Initiated Contact with Patient 03/09/15 2030     Chief Complaint  Patient presents with  . Back Pain  . Rash   (Consider location/radiation/quality/duration/timing/severity/associated sxs/prior Treatment) HPI Comments: Morbidly obese female with type 2 diabetes mellitus is complaining of pain in the mid low back for 2 days. She states this started suddenly when she arose from a sitting position. She states the pain is substantially worse with any type of movement such is bending twisting rotating at the waist and ambulation. No prior history to the low back. No fall or other trauma.  Second complaint is that of a rash. There is an erythematous ovoid and somewhat crusty type of rash in the fat fold of the lower abdomen. A slightly different type of rash that is dry and crusty and irregularly shaped is located to the right buttock, right wrist and left elbow. All of these areas are pruritic.   Past Medical History  Diagnosis Date  . Diabetes mellitus   . Hypertension   . Depression   . Anxiety   . Pneumonia   . Headache     "2 a month"  . Neuropathy   . Arthritis    Past Surgical History  Procedure Laterality Date  . Abdominal hysterectomy    . Carpal tunnel release    . Ulner nerve     . Tonsillectomy    . Neck surgery    . Knee surgery Left   . Knee arthroscopy Right    Family History  Problem Relation Age of Onset  . Cancer Mother   . Diabetes Daughter   . Diabetes Maternal Grandmother   . Heart attack Neg Hx   . Hypertension Mother   . Hypertension Maternal Grandmother   . Stroke Neg Hx    Social History  Substance Use Topics  . Smoking status: Never Smoker   . Smokeless tobacco: Never Used  . Alcohol Use: No   OB History    No data available     Review of Systems  Constitutional: Positive for activity change. Negative for fever.  HENT: Negative.   Respiratory: Negative.    Cardiovascular: Negative for chest pain and leg swelling.  Gastrointestinal: Negative.   Genitourinary: Negative.   Musculoskeletal: Positive for myalgias and back pain. Negative for neck pain.  Skin: Positive for rash.  Neurological: Negative for dizziness, syncope, numbness and headaches.    Allergies  Shrimp  Home Medications   Prior to Admission medications   Medication Sig Start Date End Date Taking? Authorizing Provider  acetaminophen (TYLENOL) 650 MG suppository Place 1 suppository (650 mg total) rectally every 4 (four) hours as needed. 02/16/15   Tresa Garter, MD  albuterol (PROVENTIL HFA;VENTOLIN HFA) 108 (90 BASE) MCG/ACT inhaler Inhale 2 puffs into the lungs every 6 (six) hours as needed for wheezing or shortness of breath. 05/09/14   Gregor Hams, MD  benazepril (LOTENSIN) 10 MG tablet Take 1 tablet (10 mg total) by mouth daily. 12/16/14   Tresa Garter, MD  clotrimazole-betamethasone (LOTRISONE) cream Apply to the fungal areas of the abdomen 2 times daily prn 03/09/15   Janne Napoleon, NP  diclofenac (CATAFLAM) 50 MG tablet Take 1 tablet (50 mg total) by mouth 3 (three) times daily. One tablet TID with food prn pain. 03/09/15   Janne Napoleon, NP  DULoxetine (CYMBALTA) 60 MG capsule Take 1 capsule (60 mg total)  by mouth daily. 12/31/14   Lorayne Marek, MD  gabapentin (NEURONTIN) 300 MG capsule Take 3 capsules (900 mg total) by mouth 3 (three) times daily. TAKE 3 CAPSULES BY MOUTH 3 TIMES DAILY 02/16/15   Olugbemiga E Doreene Burke, MD  glipiZIDE (GLUCOTROL) 5 MG tablet TAKE 1 TABLET BY MOUTH 2 TIMES DAILY BEFORE A MEAL 12/16/14   Tresa Garter, MD  glucose monitoring kit (FREESTYLE) monitoring kit 1 each by Does not apply route as needed for other. Dispense any model that is covered- dispense testing supplies for Q AC/ HS accuchecks- 1 month supply with one refil. 01/06/15   Lorayne Marek, MD  HYDROcodone-acetaminophen (NORCO/VICODIN) 5-325 MG per tablet Take 1 tablet by mouth  every 4 (four) hours as needed. 03/09/15   Janne Napoleon, NP  metFORMIN (GLUCOPHAGE) 1000 MG tablet Take 1 tablet (1,000 mg total) by mouth 2 (two) times daily with a meal. 12/16/14   Tresa Garter, MD  oxycodone (OXY-IR) 5 MG capsule Take 5 mg by mouth every 4 (four) hours as needed.    Historical Provider, MD  traZODone (DESYREL) 150 MG tablet Take 150 mg by mouth at bedtime.    Historical Provider, MD  triamcinolone cream (KENALOG) 0.1 % Apply 1 application topically 2 (two) times daily. 03/09/15   Janne Napoleon, NP   Meds Ordered and Administered this Visit   Medications  ketorolac (TORADOL) 30 MG/ML injection 30 mg (not administered)    There were no vitals taken for this visit. No data found.   Physical Exam  Constitutional: She is oriented to person, place, and time. She appears well-developed and well-nourished. No distress.  HENT:  Head: Normocephalic and atraumatic.  Eyes: EOM are normal.  Neck: Normal range of motion. Neck supple.  Cardiovascular: Normal rate.   Pulmonary/Chest: Effort normal. No respiratory distress.  Musculoskeletal:  Marked tenderness to mild to moderate palpation of the lower paralumbar sacral musculature. Minor tenderness over the sacrum and lower lumbar spine. No deformity, swelling or discoloration.  Neurological: She is alert and oriented to person, place, and time. No cranial nerve deficit.  Skin: Skin is warm and dry.  There are 2 types of rashes the patient is complaining.  There is a moist erythematous rash in the that fold of the lower abdomen. There is a second type of rash that is dry and regularly shaped and patches to the right hip, left antecubital fossa. There is a streaky, red papular vesicular rash to the right volar wrist forming a streaked pattern.   Nursing note and vitals reviewed.   ED Course  Procedures (including critical care time)  Labs Review Labs Reviewed - No data to display  Imaging Review No results  found.   Visual Acuity Review  Right Eye Distance:   Left Eye Distance:   Bilateral Distance:    Right Eye Near:   Left Eye Near:    Bilateral Near:         MDM   1. Midline low back pain without sciatica   2. Lumbosacral strain, initial encounter   3. Muscle strain   4. Candidal intertrigo   5. Contact dermatitis    toradol 30 mg IM cataflam 50 mg  norco 5 mg #15 lotrisone cream to abdomen Triamcinolone cream to other rash    Janne Napoleon, NP 03/09/15 2051

## 2015-03-10 ENCOUNTER — Ambulatory Visit: Payer: Self-pay | Attending: Internal Medicine

## 2015-03-10 ENCOUNTER — Ambulatory Visit: Payer: Self-pay | Admitting: Physical Therapy

## 2015-03-12 ENCOUNTER — Ambulatory Visit: Payer: Self-pay | Admitting: Physical Therapy

## 2015-03-12 DIAGNOSIS — S40012A Contusion of left shoulder, initial encounter: Secondary | ICD-10-CM

## 2015-03-12 DIAGNOSIS — M25512 Pain in left shoulder: Secondary | ICD-10-CM

## 2015-03-12 DIAGNOSIS — M25611 Stiffness of right shoulder, not elsewhere classified: Secondary | ICD-10-CM

## 2015-03-12 DIAGNOSIS — T07XXXA Unspecified multiple injuries, initial encounter: Secondary | ICD-10-CM

## 2015-03-12 DIAGNOSIS — M25612 Stiffness of left shoulder, not elsewhere classified: Secondary | ICD-10-CM

## 2015-03-12 DIAGNOSIS — Z9889 Other specified postprocedural states: Secondary | ICD-10-CM

## 2015-03-12 DIAGNOSIS — M19012 Primary osteoarthritis, left shoulder: Secondary | ICD-10-CM

## 2015-03-12 NOTE — Therapy (Signed)
[address], [place], [address] Phone: [phone]   Fax:  [phone]  Physical Therapy Treatment  Patient Details  Name: Briana Alvarez MRN: [id number] Date of Birth: Dec 11, 1960 Referring Provider:  Lorayne Marek, MD  Encounter Date: 03/12/2015      PT End of Session - 03/12/15 1108    Visit Number 7   Number of Visits 20   Date for PT Re-Evaluation 04/09/15   PT Start Time 1101   PT Stop Time 1139   PT Time Calculation (min) 38 min      Past Medical History  Diagnosis Date  . Diabetes mellitus   . Hypertension   . Depression   . Anxiety   . Pneumonia   . Headache     "2 a month"  . Neuropathy   . Arthritis     Past Surgical History  Procedure Laterality Date  . Abdominal hysterectomy    . Carpal tunnel release    . Ulner nerve     . Tonsillectomy    . Neck surgery    . Knee surgery Left   . Knee arthroscopy Right     There were no vitals filed for this visit.  Visit Diagnosis:  Pain in joint, shoulder region, left  S/P shoulder surgery  Decreased ROM of left shoulder  Shoulder contusion, left, initial encounter  Arthritis of left shoulder region  Strains of multiple ligaments or muscles  Decreased range of motion of shoulder, right  Decreased range of motion of shoulder, left      Subjective Assessment - 03/12/15 1105    Subjective its been achy, I think its the weather   Pain Score 4    Pain Location Shoulder   Pain Orientation Left   Pain Descriptors / Indicators Aching   Aggravating Factors  using turn signal, lowering something from overhead   Pain Relieving Factors ice                         [facility] Adult PT Treatment/Exercise - 03/12/15 0001    Shoulder Exercises: Supine   Flexion Left;10 reps;Weights   Shoulder Flexion Weight (lbs) 1   Shoulder Exercises: Sidelying   External Rotation Left;20 reps;Weights   External Rotation Weight (lbs)  2   ABduction Left;20 reps;Weights   ABduction Weight (lbs) 1   Shoulder Exercises: Standing   External Rotation Left;20 reps;Theraband   Theraband Level (Shoulder External Rotation) Level 2 (Red)   Internal Rotation Left;20 reps;Theraband   Theraband Level (Shoulder Internal Rotation) Level 2 (Red)   Flexion Left;20 reps;Theraband   Theraband Level (Shoulder Flexion) Level 2 (Red)   Extension 20 reps;Theraband   Theraband Level (Shoulder Extension) Level 2 (Red)   Row Both;20 reps;Theraband   Theraband Level (Shoulder Row) Level 2 (Red)   Shoulder Exercises: ROM/Strengthening   UBE (Upper Arm Bike) Level 1 using left arm only due to right shoulder pain, 3 min forward 1 minute back   Manual Therapy   Manual Therapy Taping   Kinesiotex Create Space;Inhibit Muscle   Kinesiotix   Create Space SHoulder pain with movement out of small kinesiotape book                  PT Short Term Goals - 03/12/15 1126    PT SHORT TERM GOAL #1   Title Pt will be I with initial HEP   Time 2   Period Weeks   Status  Achieved   PT SHORT TERM GOAL #2   Title Pt will demonstrate and verbalize understanding of condition management including ice/heat, positioning for comfort   Time 2   Period Weeks   Status Achieved   PT SHORT TERM GOAL #3   Title Pt. will demo full AROM in supine into shoulder flexion and abduction    Time 4   Period Weeks   Status Achieved           PT Long Term Goals - 02/26/15 1111    PT LONG TERM GOAL #1   Title Pt. will be I with advanced HEP   Time 8   Status On-going   PT LONG TERM GOAL #2   Title Pt. will report 50% reduction in  pain with shoulder abduction movement in order to wash her hair   Time 6   Period Weeks   Status Achieved   PT LONG TERM GOAL #3   Title Pt. will demo improved strength to 4+/5  without pain (shoulder flexion and abduction)   Baseline 4+/5 flexion and abduction with minimal pain   Time 6   Period Weeks   Status Partially  Met   PT LONG TERM GOAL #4   Title pt. will report improvement in ability to lift things such as her purse without increased pain (once lifting restrictions are lifted)   Baseline lifting restrictions have not been lifted however she still lifts her purse with LUE and no pain   Time 8   Period Weeks   Status Partially Met   PT LONG TERM GOAL #5   Title Pt will demo increased AROM in sitting shoulder flexion from 50 to 110 degrees in order to perform functional activities like reaching into cabinents overhead wthout pain.   Baseline now has Full ROM    Time 8   Period Weeks   Status Achieved               Plan - 03/12/15 1139    Clinical Impression Statement Pt demonstrates full supine and standing AROM left shoulder, She has pain with flexion activities. Trial of kinesiotape for superioanterior shoulder pain.    PT Next Visit Plan assess benefit of tape        Problem List Patient Active Problem List   Diagnosis Date Noted  . Lumbar radiculopathy, chronic 02/18/2014  . Hypertriglyceridemia 01/08/2014  . DM (diabetes mellitus) 07/12/2013  . Insomnia 07/12/2013  . Depression 07/12/2013  . Essential hypertension, benign 07/12/2013    Dorene Ar, PTA 03/12/2015, 11:42 AM  [facility] [address] [place], [place], [address] Phone: [phone]   Fax:  [phone]

## 2015-03-19 ENCOUNTER — Ambulatory Visit: Payer: Self-pay | Admitting: Family Medicine

## 2015-03-20 ENCOUNTER — Ambulatory Visit (HOSPITAL_COMMUNITY): Payer: Self-pay

## 2015-03-31 ENCOUNTER — Ambulatory Visit: Payer: Self-pay | Attending: Specialist | Admitting: Physical Therapy

## 2015-03-31 DIAGNOSIS — M25612 Stiffness of left shoulder, not elsewhere classified: Secondary | ICD-10-CM

## 2015-03-31 DIAGNOSIS — T07XXXA Unspecified multiple injuries, initial encounter: Secondary | ICD-10-CM

## 2015-03-31 DIAGNOSIS — S40012A Contusion of left shoulder, initial encounter: Secondary | ICD-10-CM | POA: Insufficient documentation

## 2015-03-31 DIAGNOSIS — Z9889 Other specified postprocedural states: Secondary | ICD-10-CM | POA: Insufficient documentation

## 2015-03-31 DIAGNOSIS — T148 Other injury of unspecified body region: Secondary | ICD-10-CM | POA: Insufficient documentation

## 2015-03-31 DIAGNOSIS — M25512 Pain in left shoulder: Secondary | ICD-10-CM | POA: Insufficient documentation

## 2015-03-31 DIAGNOSIS — M19012 Primary osteoarthritis, left shoulder: Secondary | ICD-10-CM

## 2015-03-31 DIAGNOSIS — M129 Arthropathy, unspecified: Secondary | ICD-10-CM | POA: Insufficient documentation

## 2015-03-31 DIAGNOSIS — M7582 Other shoulder lesions, left shoulder: Secondary | ICD-10-CM | POA: Insufficient documentation

## 2015-03-31 NOTE — Therapy (Signed)
[place], [place], [address] Phone: [phone]   Fax:  [phone]  Physical Therapy Treatment  Patient Details  Name: Briana Alvarez MRN: [id number] Date of Birth: 1960/07/27 Referring Provider:  Lorayne Marek, MD  Encounter Date: 03/31/2015      PT End of Session - 03/31/15 0905    Visit Number 8   Number of Visits 20   Date for PT Re-Evaluation 04/09/15   PT Start Time 0846   PT Stop Time 0927   PT Time Calculation (min) 41 min   Activity Tolerance Patient tolerated treatment well   Behavior During Therapy [facility] for tasks assessed/performed      Past Medical History  Diagnosis Date  . Diabetes mellitus   . Hypertension   . Depression   . Anxiety   . Pneumonia   . Headache     "2 a month"  . Neuropathy   . Arthritis     Past Surgical History  Procedure Laterality Date  . Abdominal hysterectomy    . Carpal tunnel release    . Ulner nerve     . Tonsillectomy    . Neck surgery    . Knee surgery Left   . Knee arthroscopy Right     There were no vitals filed for this visit.  Visit Diagnosis:  Pain in joint, shoulder region, left  S/P shoulder surgery  Decreased ROM of left shoulder  Shoulder contusion, left, initial encounter  Arthritis of left shoulder region  Strains of multiple ligaments or muscles  Decreased range of motion of shoulder, left      Subjective Assessment - 03/31/15 0851    Subjective Still soreness with lifting out to the side, limited in lifting, reaching.  Pain does not last when she feels it.  Usually gets to a 6/10 at worst.  Has pain all day when she has PT.     Currently in Pain? No/denies            [facility] PT Assessment - 03/31/15 0852    AROM   Left Shoulder Flexion 155 Degrees   Left Shoulder ABduction 140 Degrees   Strength   Left Shoulder Flexion 3+/5  pain   Left Shoulder ABduction 4/5  pain   Left Shoulder Internal Rotation 4+/5   Left Shoulder External Rotation 4+/5             [facility] Adult PT Treatment/Exercise - 03/31/15 0901    Shoulder Exercises: Supine   Protraction Strengthening;Both;10 reps   Protraction Weight (lbs) 5   Horizontal ABduction Strengthening;Both;20 reps;Theraband   Theraband Level (Shoulder Horizontal ABduction) Level 3 (Green)   External Rotation Strengthening;Both;20 reps;Theraband   Theraband Level (Shoulder External Rotation) Level 3 (Green)   Other Supine Exercises chest press 5 lbs x 20   Shoulder Exercises: Standing   Flexion Strengthening;Left;10 reps   Theraband Level (Shoulder Flexion) Level 3 (Green)   ABduction Strengthening;Left;20 reps;Theraband   Theraband Level (Shoulder ABduction) Level 3 (Green)   Extension Strengthening;Both;20 reps   Theraband Level (Shoulder Extension) Level 3 (Green)   Row Strengthening;Both;20 reps;Theraband   Theraband Level (Shoulder Row) Level 3 (Green)   Shoulder Exercises: ROM/Strengthening   UBE (Upper Arm Bike) L 5.0, pt used LUE only for a few min due to Rt. UE pain   Wall Pushups 10 reps   Pushups Limitations 2 sets on countertop                PT  [place], [place], [address] Phone: [phone]   Fax:  [phone]  Physical Therapy Treatment  Patient Details  Name: Briana Alvarez MRN: [id number] Date of Birth: [birth year] Referring Provider:  Lorayne Marek, MD  Encounter Date: 03/31/2015      PT End of Session - 03/31/15 0905    Visit Number 8   Number of Visits 20   Date for PT Re-Evaluation 04/09/15   PT Start Time 0846   PT Stop Time 0927   PT Time Calculation (min) 41 min   Activity Tolerance Patient tolerated treatment well   Behavior During Therapy [facility] for tasks assessed/performed      Past Medical History  Diagnosis Date  . Diabetes mellitus   . Hypertension   . Depression   . Anxiety   . Pneumonia   . Headache     "2 a month"  . Neuropathy   . Arthritis     Past Surgical History  Procedure Laterality Date  . Abdominal hysterectomy    . Carpal tunnel release    . Ulner nerve     . Tonsillectomy    . Neck surgery    . Knee surgery Left   . Knee arthroscopy Right     There were no vitals filed for this visit.  Visit Diagnosis:  Pain in joint, shoulder region, left  S/P shoulder surgery  Decreased ROM of left shoulder  Shoulder contusion, left, initial encounter  Arthritis of left shoulder region  Strains of multiple ligaments or muscles  Decreased range of motion of shoulder, left      Subjective Assessment - 03/31/15 0851    Subjective Still soreness with lifting out to the side, limited in lifting, reaching.  Pain does not last when she feels it.  Usually gets to a 6/10 at worst.  Has pain all day when she has PT.     Currently in Pain? No/denies            [facility] PT Assessment - 03/31/15 0852    AROM   Left Shoulder Flexion 155 Degrees   Left Shoulder ABduction 140 Degrees   Strength   Left Shoulder Flexion 3+/5  pain   Left Shoulder ABduction 4/5  pain   Left Shoulder Internal Rotation 4+/5   Left Shoulder External Rotation 4+/5             [facility] Adult PT Treatment/Exercise - 03/31/15 0901    Shoulder Exercises: Supine   Protraction Strengthening;Both;10 reps   Protraction Weight (lbs) 5   Horizontal ABduction Strengthening;Both;20 reps;Theraband   Theraband Level (Shoulder Horizontal ABduction) Level 3 (Green)   External Rotation Strengthening;Both;20 reps;Theraband   Theraband Level (Shoulder External Rotation) Level 3 (Green)   Other Supine Exercises chest press 5 lbs x 20   Shoulder Exercises: Standing   Flexion Strengthening;Left;10 reps   Theraband Level (Shoulder Flexion) Level 3 (Green)   ABduction Strengthening;Left;20 reps;Theraband   Theraband Level (Shoulder ABduction) Level 3 (Green)   Extension Strengthening;Both;20 reps   Theraband Level (Shoulder Extension) Level 3 (Green)   Row Strengthening;Both;20 reps;Theraband   Theraband Level (Shoulder Row) Level 3 (Green)   Shoulder Exercises: ROM/Strengthening   UBE (Upper Arm Bike) L 5.0, pt used LUE only for a few min due to Rt. UE pain   Wall Pushups 10 reps   Pushups Limitations 2 sets on countertop                PT

## 2015-04-10 ENCOUNTER — Ambulatory Visit: Payer: Self-pay | Attending: Family Medicine | Admitting: Family Medicine

## 2015-04-10 ENCOUNTER — Encounter: Payer: Self-pay | Admitting: Family Medicine

## 2015-04-10 VITALS — BP 137/87 | HR 75 | Temp 98.9°F | Resp 16 | Ht 66.5 in | Wt 232.0 lb

## 2015-04-10 DIAGNOSIS — Z0000 Encounter for general adult medical examination without abnormal findings: Secondary | ICD-10-CM

## 2015-04-10 DIAGNOSIS — E1142 Type 2 diabetes mellitus with diabetic polyneuropathy: Secondary | ICD-10-CM | POA: Insufficient documentation

## 2015-04-10 DIAGNOSIS — F32A Depression, unspecified: Secondary | ICD-10-CM

## 2015-04-10 DIAGNOSIS — M5416 Radiculopathy, lumbar region: Secondary | ICD-10-CM | POA: Insufficient documentation

## 2015-04-10 DIAGNOSIS — Z7984 Long term (current) use of oral hypoglycemic drugs: Secondary | ICD-10-CM | POA: Insufficient documentation

## 2015-04-10 DIAGNOSIS — Z79899 Other long term (current) drug therapy: Secondary | ICD-10-CM | POA: Insufficient documentation

## 2015-04-10 DIAGNOSIS — F329 Major depressive disorder, single episode, unspecified: Secondary | ICD-10-CM | POA: Insufficient documentation

## 2015-04-10 DIAGNOSIS — Z23 Encounter for immunization: Secondary | ICD-10-CM | POA: Insufficient documentation

## 2015-04-10 DIAGNOSIS — E119 Type 2 diabetes mellitus without complications: Secondary | ICD-10-CM

## 2015-04-10 LAB — POCT URINALYSIS DIPSTICK
Bilirubin, UA: NEGATIVE
Glucose, UA: NEGATIVE
Ketones, UA: NEGATIVE
Leukocytes, UA: NEGATIVE
Nitrite, UA: NEGATIVE
PH UA: 5.5
PROTEIN UA: NEGATIVE
RBC UA: NEGATIVE
UROBILINOGEN UA: 0.2

## 2015-04-10 LAB — GLUCOSE, POCT (MANUAL RESULT ENTRY): POC Glucose: 224 mg/dl — AB (ref 70–99)

## 2015-04-10 LAB — POCT GLYCOSYLATED HEMOGLOBIN (HGB A1C): HEMOGLOBIN A1C: 5.9

## 2015-04-10 MED ORDER — CLONIDINE HCL 0.1 MG PO TABS: 0.2000 mg | ORAL_TABLET | Freq: Once | ORAL | Status: DC

## 2015-04-10 MED ORDER — ACETAMINOPHEN-CODEINE #3 300-30 MG PO TABS: 1.0000 | ORAL_TABLET | Freq: Three times a day (TID) | ORAL | Status: DC | PRN

## 2015-04-10 MED ORDER — GLIPIZIDE 10 MG PO TABS: ORAL_TABLET | ORAL | Status: DC

## 2015-04-10 MED ORDER — GABAPENTIN 300 MG PO CAPS: 600.0000 mg | ORAL_CAPSULE | Freq: Three times a day (TID) | ORAL | Status: DC

## 2015-04-10 MED ORDER — PREGABALIN 50 MG PO CAPS: 50.0000 mg | ORAL_CAPSULE | Freq: Three times a day (TID) | ORAL | Status: DC

## 2015-04-10 NOTE — Assessment & Plan Note (Signed)
Since rexulti is increasing blood sugar increase glipizide to 10 mg  Taper off gabapentin to 600 mg ordered lyrica which will eventually replace gabapentin

## 2015-04-10 NOTE — Progress Notes (Signed)
Patient ID: Briana Alvarez, female   DOB: 05-20-1961, 54 y.o.   MRN: [id number]   Subjective:  Patient ID: Briana Alvarez, female    DOB: 1960-12-28  Age: 54 y.o. MRN: [id number]  CC: Diabetes   HPI Briana Alvarez presents for   1. CHRONIC DIABETES with peripheral neuropathy   Disease Monitoring  Blood Sugar Ranges: 197-225, since starting rexulti   Polyuria: no   Visual problems: no   Medication Compliance: yes  Medication Side Effects  Hypoglycemia: no   Preventitive Health Care  Eye Exam: due   Foot Exam: done today   Diet pattern:   Exercise:   2. Low back pain: L sided. Chronic. Worsening. Radiates to hip. Established with ortho. Recently had a L shoulder surgery.   Social History  Substance Use Topics  . Smoking status: Never Smoker   . Smokeless tobacco: Never Used  . Alcohol Use: No    Outpatient Prescriptions Prior to Visit  Medication Sig Dispense Refill  . albuterol (PROVENTIL HFA;VENTOLIN HFA) 108 (90 BASE) MCG/ACT inhaler Inhale 2 puffs into the lungs every 6 (six) hours as needed for wheezing or shortness of breath. 1 Inhaler 2  . benazepril (LOTENSIN) 10 MG tablet Take 1 tablet (10 mg total) by mouth daily. 30 tablet 11  . DULoxetine (CYMBALTA) 60 MG capsule Take 1 capsule (60 mg total) by mouth daily. 120 capsule 3  . gabapentin (NEURONTIN) 300 MG capsule Take 3 capsules (900 mg total) by mouth 3 (three) times daily. TAKE 3 CAPSULES BY MOUTH 3 TIMES DAILY 270 capsule 1  . glipiZIDE (GLUCOTROL) 5 MG tablet TAKE 1 TABLET BY MOUTH 2 TIMES DAILY BEFORE A MEAL 60 tablet 3  . metFORMIN (GLUCOPHAGE) 1000 MG tablet Take 1 tablet (1,000 mg total) by mouth 2 (two) times daily with a meal. 60 tablet 3  . traZODone (DESYREL) 150 MG tablet Take 150 mg by mouth at bedtime.    Marland Kitchen acetaminophen (TYLENOL) 650 MG suppository Place 1 suppository (650 mg total) rectally every 4 (four) hours as needed. (Patient not taking: Reported on 04/10/2015) 12 suppository 0  .  clotrimazole-betamethasone (LOTRISONE) cream Apply to the fungal areas of the abdomen 2 times daily prn (Patient not taking: Reported on 04/10/2015) 30 g 0  . diclofenac (CATAFLAM) 50 MG tablet Take 1 tablet (50 mg total) by mouth 3 (three) times daily. One tablet TID with food prn pain. (Patient not taking: Reported on 04/10/2015) 15 tablet 0  . glucose monitoring kit (FREESTYLE) monitoring kit 1 each by Does not apply route as needed for other. Dispense any model that is covered- dispense testing supplies for Q AC/ HS accuchecks- 1 month supply with one refil. (Patient not taking: Reported on 04/10/2015) 1 each 1  . HYDROcodone-acetaminophen (NORCO/VICODIN) 5-325 MG per tablet Take 1 tablet by mouth every 4 (four) hours as needed. (Patient not taking: Reported on 04/10/2015) 15 tablet 0  . oxycodone (OXY-IR) 5 MG capsule Take 5 mg by mouth every 4 (four) hours as needed.    . triamcinolone cream (KENALOG) 0.1 % Apply 1 application topically 2 (two) times daily. (Patient not taking: Reported on 04/10/2015) 30 g 0   No facility-administered medications prior to visit.    ROS Review of Systems  Constitutional: Negative for fever and chills.  Eyes: Negative for visual disturbance.  Respiratory: Negative for shortness of breath.   Cardiovascular: Negative for chest pain.  Gastrointestinal: Negative for abdominal pain and blood in stool.  Musculoskeletal: Positive for back pain.  Negative for arthralgias.  Skin: Negative for rash.  Allergic/Immunologic: Negative for immunocompromised state.  Hematological: Negative for adenopathy. Does not bruise/bleed easily.  Psychiatric/Behavioral: Negative for suicidal ideas and dysphoric mood.   Objective:  BP 137/87 mmHg  Pulse 75  Temp(Src) 98.9 F (37.2 C) (Oral)  Resp 16  Ht 5' 6.5" (1.689 m)  Wt 232 lb (105.235 kg)  BMI 36.89 kg/m2  SpO2 96%  BP/Weight 04/10/2015 03/09/2015 2/70/3500  Systolic BP 938 182 993  Diastolic BP 87 89 88  Wt. (Lbs)  232 - 234  BMI 36.89 - 37.21   Physical Exam  Constitutional: She is oriented to person, place, and time. She appears well-developed and well-nourished. No distress.  HENT:  Head: Normocephalic and atraumatic.  Cardiovascular: Normal rate, regular rhythm, normal heart sounds and intact distal pulses.   Pulmonary/Chest: Effort normal and breath sounds normal.  Musculoskeletal: She exhibits no edema.       Lumbar back: She exhibits tenderness. She exhibits no bony tenderness.  Neurological: She is alert and oriented to person, place, and time.  Skin: Skin is warm and dry. No rash noted.  Psychiatric: She has a normal mood and affect.    Assessment & Plan:   Problem List Items Addressed This Visit    Depression (Chronic)   Relevant Medications   Brexpiprazole (REXULTI) 1 MG TABS   Diabetes type 2, controlled ([place]) - Primary (Chronic)   Relevant Medications   glipiZIDE (GLUCOTROL) 10 MG tablet   Other Relevant Orders   POCT glycosylated hemoglobin (Hb A1C) (Completed)   POCT glucose (manual entry) (Completed)   Microalbumin/Creatinine Ratio, Urine   POCT urinalysis dipstick (Completed)   Ambulatory referral to Ophthalmology   Diabetic peripheral neuropathy associated with type 2 diabetes mellitus (HCC)   Relevant Medications   Brexpiprazole (REXULTI) 1 MG TABS   glipiZIDE (GLUCOTROL) 10 MG tablet   pregabalin (LYRICA) 50 MG capsule   gabapentin (NEURONTIN) 300 MG capsule   Lumbar radiculopathy, chronic (Chronic)   Relevant Medications   Brexpiprazole (REXULTI) 1 MG TABS   acetaminophen-codeine (TYLENOL #3) 300-30 MG tablet   pregabalin (LYRICA) 50 MG capsule   gabapentin (NEURONTIN) 300 MG capsule   Other Relevant Orders   Ambulatory referral to Physical Therapy   Ambulatory referral to Orthopedic Surgery    Other Visit Diagnoses    Healthcare maintenance        Relevant Orders    Flu Vaccine QUAD 36+ mos IM (Completed)       Meds ordered this encounter  Medications   . DISCONTD: cloNIDine (CATAPRES) tablet 0.2 mg    Sig:     Follow-up: No Follow-up on file.   Boykin Nearing MD

## 2015-04-10 NOTE — Progress Notes (Signed)
C/C Back pain  Pain running down lt leg when sitting for too long  Pain scale #8 No hx tobacco

## 2015-04-10 NOTE — Patient Instructions (Addendum)
Briana Alvarez was seen today for diabetes.  Diagnoses and all orders for this visit:  Controlled type 2 diabetes mellitus without complication, without long-term current use of insulin (HCC) -     POCT glycosylated hemoglobin (Hb A1C) -     POCT glucose (manual entry) -     Microalbumin/Creatinine Ratio, Urine -     POCT urinalysis dipstick -     glipiZIDE (GLUCOTROL) 10 MG tablet; TAKE 1 TABLET BY MOUTH 2 TIMES DAILY BEFORE A MEAL  Lumbar radiculopathy, chronic -     acetaminophen-codeine (TYLENOL #3) 300-30 MG tablet; Take 1 tablet by mouth every 8 (eight) hours as needed for moderate pain. -     Ambulatory referral to Physical Therapy -     Ambulatory referral to Orthopedic Surgery  Diabetic peripheral neuropathy associated with type 2 diabetes mellitus (HCC) -     pregabalin (LYRICA) 50 MG capsule; Take 1 capsule (50 mg total) by mouth 3 (three) times daily. -     gabapentin (NEURONTIN) 300 MG capsule; Take 2 capsules (600 mg total) by mouth 3 (three) times daily.  Healthcare maintenance -     Flu Vaccine QUAD 36+ mos IM  Depression  Other orders -     Discontinue: cloNIDine (CATAPRES) tablet 0.2 mg; Take 2 tablets (0.2 mg total) by mouth once.   Since rexulti is increasing blood sugar increase glipizide to 10 mg  Taper off gabapentin to 600 mg ordered lyrica which will eventually replace gabapentin

## 2015-04-11 LAB — MICROALBUMIN / CREATININE URINE RATIO
Creatinine, Urine: 252 mg/dL (ref 20–320)
MICROALB/CREAT RATIO: 5 ug/mg{creat} (ref <30)
Microalb, Ur: 1.3 mg/dL

## 2015-04-13 ENCOUNTER — Other Ambulatory Visit: Payer: Self-pay | Admitting: Internal Medicine

## 2015-04-13 NOTE — Telephone Encounter (Signed)
Patient called and requested a med refill for metFORMIN (GLUCOPHAGE) 1000 MG tablet.  Please f/u with pt.

## 2015-04-14 NOTE — Telephone Encounter (Signed)
Date of birth verified by pt  Normal lab results given to pt  Metformin refills send to [place]  Pt verbalized understanding

## 2015-04-14 NOTE — Telephone Encounter (Signed)
-----   Message from Arnoldo Morale, MD sent at 04/13/2015 10:03 AM EDT ----- Please inform the patient that labs are normal. Thank you.

## 2015-04-20 ENCOUNTER — Encounter (HOSPITAL_COMMUNITY): Payer: Self-pay | Admitting: *Deleted

## 2015-04-20 ENCOUNTER — Emergency Department (INDEPENDENT_AMBULATORY_CARE_PROVIDER_SITE_OTHER)
Admission: EM | Admit: 2015-04-20 | Discharge: 2015-04-20 | Disposition: A | Discharge status: Home / Self Care | Payer: Self-pay | Source: Home / Self Care | Attending: Family Medicine | Admitting: Family Medicine

## 2015-04-20 ENCOUNTER — Emergency Department (INDEPENDENT_AMBULATORY_CARE_PROVIDER_SITE_OTHER): Payer: Self-pay

## 2015-04-20 DIAGNOSIS — S40012A Contusion of left shoulder, initial encounter: Secondary | ICD-10-CM

## 2015-04-20 DIAGNOSIS — S7002XA Contusion of left hip, initial encounter: Secondary | ICD-10-CM

## 2015-04-20 DIAGNOSIS — S7012XA Contusion of left thigh, initial encounter: Secondary | ICD-10-CM

## 2015-04-20 MED ORDER — HYDROCODONE-ACETAMINOPHEN 5-325 MG PO TABS: 1.0000 | ORAL_TABLET | Freq: Four times a day (QID) | ORAL | Status: DC | PRN

## 2015-04-20 NOTE — ED Notes (Signed)
Pt  [place] [address]     And  Injured her l  Shoulder   And    l  Hip   sev  Days  Ago        Pt   Reports     Surgery        In august          On that  Affected  Shoulder       She  Ambulated  To  Room

## 2015-04-20 NOTE — Discharge Instructions (Signed)
Wear sling for comfort, use ice to hip and shoulder and pain medicine as needed and see dr dean if further concerns.

## 2015-04-20 NOTE — ED Provider Notes (Signed)
CSN: [id number]     Arrival date & time 04/20/15  1816 History   First MD Initiated Contact with Patient 04/20/15 1917     Chief Complaint  Patient presents with  . Fall   (Consider location/radiation/quality/duration/timing/severity/associated sxs/prior Treatment) Patient is a 54 y.o. female presenting with fall. The history is provided by the patient.  Fall This is a new problem. The current episode started more than 2 days ago (tripped and fell onto left shoulder and hip , s/p shoulder surg in aug, doing better until now.). The problem has not changed since onset.Pertinent negatives include no chest pain, no abdominal pain and no headaches.    Past Medical History  Diagnosis Date  . Diabetes mellitus   . Hypertension   . Depression   . Anxiety   . Pneumonia   . Headache     "2 a month"  . Neuropathy ([place])   . Arthritis    Past Surgical History  Procedure Laterality Date  . Abdominal hysterectomy    . Carpal tunnel release    . Ulner nerve     . Tonsillectomy    . Neck surgery    . Knee surgery Left   . Knee arthroscopy Right    Family History  Problem Relation Age of Onset  . Cancer Mother   . Diabetes Daughter   . Diabetes Maternal Grandmother   . Heart attack Neg Hx   . Hypertension Mother   . Hypertension Maternal Grandmother   . Stroke Neg Hx    Social History  Substance Use Topics  . Smoking status: Never Smoker   . Smokeless tobacco: Never Used  . Alcohol Use: No   OB History    No data available     Review of Systems  Constitutional: Negative.   Cardiovascular: Negative.  Negative for chest pain.  Gastrointestinal: Negative.  Negative for abdominal pain.  Genitourinary: Negative.   Musculoskeletal: Positive for joint swelling and gait problem.  Skin: Negative.   Neurological: Negative for headaches.  All other systems reviewed and are negative.   Allergies  Shrimp  Home Medications   Prior to Admission medications   Medication Sig  Start Date End Date Taking? Authorizing Provider  acetaminophen-codeine (TYLENOL #3) 300-30 MG tablet Take 1 tablet by mouth every 8 (eight) hours as needed for moderate pain. 04/10/15   Josalyn Funches, MD  albuterol (PROVENTIL HFA;VENTOLIN HFA) 108 (90 BASE) MCG/ACT inhaler Inhale 2 puffs into the lungs every 6 (six) hours as needed for wheezing or shortness of breath. 05/09/14   Gregor Hams, MD  benazepril (LOTENSIN) 10 MG tablet Take 1 tablet (10 mg total) by mouth daily. 12/16/14   Tresa Garter, MD  Brexpiprazole (REXULTI) 1 MG TABS Take by mouth.    Historical Provider, MD  DULoxetine (CYMBALTA) 60 MG capsule Take 1 capsule (60 mg total) by mouth daily. 12/31/14   Lorayne Marek, MD  gabapentin (NEURONTIN) 300 MG capsule Take 2 capsules (600 mg total) by mouth 3 (three) times daily. 04/10/15   Josalyn Funches, MD  glipiZIDE (GLUCOTROL) 10 MG tablet TAKE 1 TABLET BY MOUTH 2 TIMES DAILY BEFORE A MEAL 04/10/15   Josalyn Funches, MD  glucose monitoring kit (FREESTYLE) monitoring kit 1 each by Does not apply route as needed for other. Dispense any model that is covered- dispense testing supplies for Q AC/ HS accuchecks- 1 month supply with one refil. Patient not taking: Reported on 04/10/2015 01/06/15   Lorayne Marek, MD  HYDROcodone-acetaminophen (  NORCO/VICODIN) 5-325 MG tablet Take 1 tablet by mouth every 6 (six) hours as needed for moderate pain. 04/20/15   Billy Fischer, MD  metFORMIN (GLUCOPHAGE) 1000 MG tablet TAKE 1 TABLET BY MOUTH 2 TIMES DAILY WITH A MEAL. 04/14/15   Josalyn Funches, MD  oxycodone (OXY-IR) 5 MG capsule Take 5 mg by mouth every 4 (four) hours as needed.    Historical Provider, MD  pregabalin (LYRICA) 50 MG capsule Take 1 capsule (50 mg total) by mouth 3 (three) times daily. 04/10/15   Josalyn Funches, MD  traZODone (DESYREL) 150 MG tablet Take 150 mg by mouth at bedtime.    Historical Provider, MD   Meds Ordered and Administered this Visit  Medications - No data to  display  BP 156/89 mmHg  Pulse 70  Temp(Src) 97.4 F (36.3 C) (Oral)  Resp 18  SpO2 97% No data found.   Physical Exam  Constitutional: She is oriented to person, place, and time. She appears well-developed and well-nourished. No distress.  Musculoskeletal: She exhibits tenderness.  Pain left lat shoulder, no visible trauma, is able to abduct lat to 90, distal nv fxn intact, left hip soreness , no visible trauma, ambulatory,..   Neurological: She is alert and oriented to person, place, and time.  Skin: Skin is warm and dry. No erythema.  Nursing note and vitals reviewed.   ED Course  Procedures (including critical care time)  Labs Review Labs Reviewed - No data to display  Imaging Review No results found.   Visual Acuity Review  Right Eye Distance:   Left Eye Distance:   Bilateral Distance:    Right Eye Near:   Left Eye Near:    Bilateral Near:         MDM   1. Contusion of left shoulder, initial encounter   2. Contusion of left hip and thigh, initial encounter        Billy Fischer, MD 04/22/15 2027

## 2015-04-30 ENCOUNTER — Ambulatory Visit: Payer: Self-pay | Attending: Specialist | Admitting: Physical Therapy

## 2015-04-30 DIAGNOSIS — M5442 Lumbago with sciatica, left side: Secondary | ICD-10-CM | POA: Insufficient documentation

## 2015-04-30 DIAGNOSIS — R293 Abnormal posture: Secondary | ICD-10-CM | POA: Insufficient documentation

## 2015-04-30 DIAGNOSIS — M62830 Muscle spasm of back: Secondary | ICD-10-CM | POA: Insufficient documentation

## 2015-04-30 DIAGNOSIS — M2560 Stiffness of unspecified joint, not elsewhere classified: Secondary | ICD-10-CM | POA: Insufficient documentation

## 2015-05-04 ENCOUNTER — Ambulatory Visit: Payer: Self-pay | Admitting: Physical Therapy

## 2015-05-04 ENCOUNTER — Telehealth: Payer: Self-pay | Admitting: Family Medicine

## 2015-05-04 DIAGNOSIS — M62830 Muscle spasm of back: Secondary | ICD-10-CM

## 2015-05-04 DIAGNOSIS — R293 Abnormal posture: Secondary | ICD-10-CM

## 2015-05-04 DIAGNOSIS — M5442 Lumbago with sciatica, left side: Secondary | ICD-10-CM

## 2015-05-04 DIAGNOSIS — M5416 Radiculopathy, lumbar region: Secondary | ICD-10-CM

## 2015-05-04 DIAGNOSIS — E1142 Type 2 diabetes mellitus with diabetic polyneuropathy: Secondary | ICD-10-CM

## 2015-05-04 DIAGNOSIS — M5386 Other specified dorsopathies, lumbar region: Secondary | ICD-10-CM

## 2015-05-04 NOTE — Patient Instructions (Signed)
   Halaina Vanduzer PT, DPT, LAT, ATC  [facility] Phone: [phone]     

## 2015-05-04 NOTE — Therapy (Signed)
[place], [place], [address] Phone: [phone]   Fax:  [phone]  Physical Therapy Evaluation  Patient Details  Briana Alvarez: Briana Alvarez MRN: [id number] Date of Birth: [birth year] Referring Provider: Dr. Adrian Blackwater  Encounter Date: 05/04/2015      PT End of Session - 05/04/15 1304    Visit Number 1   Number of Visits 16   Date for PT Re-Evaluation 06/29/15   PT Start Time 8299   PT Stop Time 1100   PT Time Calculation (min) 45 min   Activity Tolerance Patient tolerated treatment well   Behavior During Therapy [name] for tasks assessed/performed      Past Medical History  Diagnosis Date  . Diabetes mellitus   . Hypertension   . Depression   . Anxiety   . Pneumonia   . Headache     "2 a month"  . Neuropathy ([place])   . Arthritis     Past Surgical History  Procedure Laterality Date  . Abdominal hysterectomy    . Carpal tunnel release    . Ulner nerve     . Tonsillectomy    . Neck surgery    . Knee surgery Left   . Knee arthroscopy Right     There were no vitals filed for this visit.  Visit Diagnosis:  Left-sided low back pain with left-sided sciatica - Plan: PT plan of care cert/re-cert  Decreased ROM of lumbar spine - Plan: PT plan of care cert/re-cert  Muscle spasm of back - Plan: PT plan of care cert/re-cert  Abnormal posture - Plan: PT plan of care cert/re-cert      Subjective Assessment - 05/04/15 1023    Subjective pt Alvarez a 54 y.o F with low back pain that has been present for 2 years that started insidously, with radiating pain in Briana L hip and N/T down Briana L leg to Briana knee. since onset she reports it has gradually gotten worse.    Limitations Standing;Walking;Lifting;Sitting   How long can you sit comfortably? 30 min   How long can you stand comfortably? 30-45 min   How long can you walk comfortably? 30-45 min   Diagnostic tests MRI 7/14 following fall, herniated disc   Patient  Stated Goals to get Briana pain to ease up, and be able to do more activities   Currently in Pain? Yes   Pain Score 6    Pain Location Back   Pain Orientation Left;Mid;Lower   Pain Descriptors / Indicators Pins and needles;Aching;Sharp;Tightness   Pain Type Chronic pain   Pain Radiating Towards to Left knee   Pain Onset 1 to 4 weeks ago   Pain Frequency Constant   Aggravating Factors  getting up in Briana morning, bending forward, vacuuming, sitting   Pain Relieving Factors hot shower, ice            [facility] PT Assessment - 05/04/15 1030    Assessment   Medical Diagnosis low back pain   Referring Provider Dr. Adrian Blackwater   Onset Date/Surgical Date --  2 years   Hand Dominance Right   Next MD Visit 05/22/2015   Prior Therapy yes for neck   Precautions   Precautions None   Restrictions   Weight Bearing Restrictions No   Balance Screen   Has Briana patient fallen in Briana past 6 months No   How many times? 1   Has Briana patient had a decrease in activity level because of a fear  of falling?  No   Alvarez Briana patient reluctant to leave their home because of a fear of falling?  No   Home Environment   Living Environment Private residence   Living Arrangements Children   Available Help at Discharge Family   Type of [place] to enter   Entrance Stairs-Number of Steps 1   [place] One level   Prior Function   Level of Independence Independent   Cognition   Overall Cognitive Status Within Functional Limits for tasks assessed   Observation/Other Assessments   Focus on Therapeutic Outcomes (FOTO)  52%   predicted 36%   ROM / Strength   AROM / PROM / Strength AROM;Strength   AROM   AROM Assessment Site Lumbar   Lumbar Flexion 24  tingling into Briana L knee   Lumbar Extension 10  pain at end of motion   Lumbar - Right Side Bend 20   Lumbar - Left Side Bend 26   Strength   Strength Assessment Site Hip;Knee   Right/Left Hip Right;Left   Right Hip Flexion 4+/5   Right  Hip ABduction 4+/5   Right Hip ADduction 4+/5  pain im back during testing   Left Hip Flexion 4+/5   Left Hip ADduction 4+/5  pain im back during testing   Right/Left Knee Right;Left   Right Knee Flexion 4+/5   Right Knee Extension 4+/5   Left Knee Flexion 4+/5   Left Knee Extension 4+/5   Palpation   Spinal mobility hypomobility of Briana intervertebral movements of L2-L5 with pain upon palpation   Palpation comment tendnereness located in Briana R lumbar parapsinals with spasm   Special Tests    Special Tests Lumbar   Lumbar Tests Slump Test;Prone Knee Bend Test;Straight Leg Raise   Slump test   Findings Positive   Side Left   Comment pain to Briana knee   Straight Leg Raise   Findings Positive   Side  Right   Comment contralateral and ipsilateral with referral of pain down Briana L leg to Briana knee   Ambulation/Gait   Gait Pattern Step-through pattern;Decreased stride length;Antalgic;Decreased trunk rotation                           PT Education - 05/04/15 1303    Education provided Yes   Education Details evaluation findings, POC, Goals, HEP, Disc anatomy   Person(s) Educated Patient   Methods Explanation   Comprehension Verbalized understanding          PT Short Term Goals - 05/04/15 1309    PT SHORT TERM GOAL #1   Title Pt will be I with initial HEP   Baseline FROM PREVOUSLY DISCHARGED EPISODE   Period Weeks   Status Achieved   PT SHORT TERM GOAL #2   Title Pt will demonstrate and verbalize understanding of condition management including ice/heat, positioning for comfort   Baseline FROM PREVOUSLY DISCHARGED EPISODE   Time 2   Period Weeks   Status Achieved   PT SHORT TERM GOAL #3   Title Pt. will demo full AROM in supine into shoulder flexion and abduction    Baseline assessed in standing not supine, Full ROM in standing FROM PREVOUSLY DISCHARGED EPISODE   Time 4   Period Weeks   Status Achieved   PT SHORT TERM GOAL #4   Title pt will be I with  inital HEP (06/03/2015)   Time 4  Period Weeks   Status New   PT SHORT TERM GOAL #5   Title pt will increase trunk mobility by > 10 degrees with < 5/10 pain  in all directions to assist with ADLs (06/03/2015)   Time 4   Period Weeks   Status New   Additional Short Term Goals   Additional Short Term Goals Yes   PT SHORT TERM GOAL #6   Title pt will be able to verbalize and demonstrate techniques to reduce low back pain and reinjury via postural awarness, lifting and carrying mechanics, and HEP (06/03/2015)   Time 6   Period Weeks   Status New   PT SHORT TERM GOAL #7   Title pt will increase her FOTO score by >/=10 points to show funcitonal improvement (06/03/2015)   Time 6   Period Weeks   Status New           PT Long Term Goals - 05/04/15 1314    PT LONG TERM GOAL #1   Title Pt. will be I with advanced HEP   Baseline FROM PREVOUSLY DISCHARGED EPISODE   Time 8   Period Weeks   Status On-going   PT LONG TERM GOAL #2   Title Pt. will report 50% reduction in  pain with shoulder abduction movement in order to wash her hair   Baseline FROM PREVOUSLY DISCHARGED EPISODE   Time 6   Period Weeks   Status Achieved   PT LONG TERM GOAL #3   Title Pt. will demo improved strength to 4+/5  without pain (shoulder flexion and abduction)   Baseline 4+/5 flexion and abduction with minimal pain FROM PREVOUSLY DISCHARGED EPISODE   Time 6   Period Weeks   Status Partially Met   PT LONG TERM GOAL #4   Title pt. will report improvement in ability to lift things such as her purse without increased pain (once lifting restrictions are lifted)   Baseline lifting restrictions have not been lifted however she still lifts her purse with LUE and no pain FROM PREVOUSLY DISCHARGED EPISODE   Time 8   Period Weeks   Status Partially Met   PT LONG TERM GOAL #5   Title Pt will demo increased AROM in sitting shoulder flexion from 50 to 110 degrees in order to perform functional activities like reaching  into cabinents overhead wthout pain.   Baseline still has painFROM PREVOUSLY DISCHARGED EPISODE   Time 8   Period Weeks   Status Achieved   Additional Long Term Goals   Additional Long Term Goals Yes   PT LONG TERM GOAL #6   Title pt will be I with all HEP at last visit (06/29/2015)   Time 8   Period Weeks   Status New   PT LONG TERM GOAL #7   Title pt will exhibit decreased lumbar parapsinals spams to assist with improved trunk mobility ( 06/29/2015)   Time 8   Period Weeks   Status New   PT LONG TERM GOAL #8   Title pt will improve trunk flexion by > 20 degrees with < 2/10 pain and with no referral of pain down Briana L leg to assist with ADLs ( 06/29/2015)   Time 8   Period Weeks   Status New   PT LONG TERM GOAL  #9   TITLE pt will be able to tolerated standing and walking for >/= 45 min with < 2/10 pain to assist with job related activities  (06/29/2015)   Time 8  Period Weeks   Status New   PT LONG TERM GOAL  #10   TITLE pt will increase her FOTO score to >/= 64 to demonatreated improved function at discharve ( 06/29/2015)   Time 8   Period Weeks   Status New               Plan - 05/04/15 1304    Clinical Impression Statement Sirenia presents to OPPT with CC of low back pain with intermittent referral of N/T to Briana L knee that has been present for a while. She demonstrates limited trunk mobility in all planes with referral of symptoms to Briana lateral knee. Palpation reveals spasm of Briana R lumbar paraspinals with hypomoblity of Briana L2-L5. She exhibits limited strength in Briana L LE due to pain during testing. She would benefit from physical therapy to decreae her pain improve trunk mobility and return to PLOF by addresing Briana impairments listed.    Pt will benefit from skilled therapeutic intervention in order to improve on Briana following deficits Decreased range of motion;Dizziness;Pain;Impaired flexibility;Decreased mobility;Decreased strength;Postural dysfunction;Increased muscle  spasms;Decreased activity tolerance;Decreased endurance;Hypomobility;Improper body mechanics   Rehab Potential Good   PT Frequency 2x / week   PT Duration 8 weeks   PT Treatment/Interventions ADLs/Self Care Home Management;Cryotherapy;Moist Heat;Patient/family education;Passive range of motion;Therapeutic exercise;Taping;Electrical Stimulation;Ultrasound;Manual techniques;Therapeutic activities;Traction;Dry needling;Iontophoresis 32m/ml Dexamethasone   PT Next Visit Plan assess response to HEP, posture educations, Modalities PRN, trunk mobs and core strengthening   PT Home Exercise Plan standing extension, hamstring stretching, pelvic tilts, lower trunk rotation   Consulted and Agree with Plan of Care Patient         Problem List Patient Active Problem List   Diagnosis Date Noted  . Diabetic peripheral neuropathy associated with type 2 diabetes mellitus ([place] 04/10/2015  . Lumbar radiculopathy, chronic 02/18/2014  . Hypertriglyceridemia 01/08/2014  . Diabetes type 2, controlled ([place] 07/12/2013  . Insomnia 07/12/2013  . Depression 07/12/2013  . Essential hypertension, benign 07/12/2013   KStarr LakePT, DPT, LAT, ATC  05/04/2015  1:26 PM    [facility] [address]Phone: [phone]  Fax:  [phone] Briana Alvarez: PBritt TheardMRN: [id number] of Birth: 112-Jan-1962

## 2015-05-04 NOTE — Telephone Encounter (Signed)
Patient called and requested anther prescription for gabapentin (NEURONTIN) 300 MG capsule. Patient stated that she usually gets more pills than prescribed and ran out early but is not able to get the refill that's left on the medication until the 21st of the month so she is requesting a new prescription to last until she is able to get the refill.   Please f/u

## 2015-05-05 MED ORDER — GABAPENTIN 300 MG PO CAPS: 300.0000 mg | ORAL_CAPSULE | Freq: Three times a day (TID) | ORAL | Status: DC

## 2015-05-05 NOTE — Telephone Encounter (Signed)
Sent in one week of gabapentin

## 2015-05-11 ENCOUNTER — Ambulatory Visit: Payer: Self-pay | Admitting: Physical Therapy

## 2015-05-12 ENCOUNTER — Other Ambulatory Visit: Payer: Self-pay | Admitting: *Deleted

## 2015-05-12 DIAGNOSIS — E1142 Type 2 diabetes mellitus with diabetic polyneuropathy: Secondary | ICD-10-CM

## 2015-05-12 MED ORDER — PREGABALIN 50 MG PO CAPS: 50.0000 mg | ORAL_CAPSULE | Freq: Three times a day (TID) | ORAL | Status: DC

## 2015-05-19 ENCOUNTER — Ambulatory Visit: Payer: Self-pay | Admitting: Physical Therapy

## 2015-05-19 DIAGNOSIS — R293 Abnormal posture: Secondary | ICD-10-CM

## 2015-05-19 DIAGNOSIS — M62830 Muscle spasm of back: Secondary | ICD-10-CM

## 2015-05-19 DIAGNOSIS — M5386 Other specified dorsopathies, lumbar region: Secondary | ICD-10-CM

## 2015-05-19 DIAGNOSIS — M5442 Lumbago with sciatica, left side: Secondary | ICD-10-CM

## 2015-05-20 NOTE — Therapy (Addendum)
[place], [place], [address] Phone: [phone]   Fax:  [phone]  Physical Therapy Treatment  Patient Details  Name: Briana Alvarez MRN: [id number] Date of Birth: 1960/12/28 Referring Provider: Dr. Adrian Blackwater  Encounter Date: 05/19/2015      PT End of Session - 05/19/15 1121    Visit Number 2   Number of Visits 16   Date for PT Re-Evaluation 06/29/15   PT Start Time 1100   PT Stop Time 1200   PT Time Calculation (min) 60 min      Past Medical History  Diagnosis Date  . Diabetes mellitus   . Hypertension   . Depression   . Anxiety   . Pneumonia   . Headache     "2 a month"  . Neuropathy ([place])   . Arthritis     Past Surgical History  Procedure Laterality Date  . Abdominal hysterectomy    . Carpal tunnel release    . Ulner nerve     . Tonsillectomy    . Neck surgery    . Knee surgery Left   . Knee arthroscopy Right     There were no vitals filed for this visit.  Visit Diagnosis:  Left-sided low back pain with left-sided sciatica  Decreased ROM of lumbar spine  Muscle spasm of back  Abnormal posture      Subjective Assessment - 05/19/15 1107    Subjective "I can't sit for very long and I can't stand for too long." "I'm thinking about a deep massage" "I also was curious about the traction we talked about"   Pain Score 5    Pain Location Back   Pain Orientation Left;Mid   Pain Type Chronic pain   Pain Radiating Towards to left knee   Aggravating Factors  sitting for long periods                         [facility] Adult PT Treatment/Exercise - 05/20/15 0001    Neck Exercises: Supine   Other Supine Exercise Did all exercises from HEP. HEP review   Traction   Type of Traction Lumbar   Min (lbs) 45   Max (lbs) 60   Hold Time 60   Rest Time 15   Time 15 minutes   Manual Therapy   Manual Therapy Soft tissue mobilization   Manual therapy comments 10 minutes   Soft tissue mobilization prone: lumbar spines   Ankle Exercises: Aerobic   Stationary Bike NUstep level 5 8 minutes                  PT Short Term Goals - 05/04/15 1309    PT SHORT TERM GOAL #1   Title Pt will be I with initial HEP   Baseline FROM PREVOUSLY DISCHARGED EPISODE   Period Weeks   Status Achieved   PT SHORT TERM GOAL #2   Title Pt will demonstrate and verbalize understanding of condition management including ice/heat, positioning for comfort   Baseline FROM PREVOUSLY DISCHARGED EPISODE   Time 2   Period Weeks   Status Achieved   PT SHORT TERM GOAL #3   Title Pt. will demo full AROM in supine into shoulder flexion and abduction    Baseline assessed in standing not supine, Full ROM in standing FROM PREVOUSLY DISCHARGED EPISODE   Time 4   Period Weeks   Status Achieved   PT SHORT TERM GOAL #4  Title pt will be I with inital HEP (06/03/2015)   Time 4   Period Weeks   Status New   PT SHORT TERM GOAL #5   Title pt will increase trunk mobility by > 10 degrees with < 5/10 pain  in all directions to assist with ADLs (06/03/2015)   Time 4   Period Weeks   Status New   Additional Short Term Goals   Additional Short Term Goals Yes   PT SHORT TERM GOAL #6   Title pt will be able to verbalize and demonstrate techniques to reduce low back pain and reinjury via postural awarness, lifting and carrying mechanics, and HEP (06/03/2015)   Time 6   Period Weeks   Status New   PT SHORT TERM GOAL #7   Title pt will increase her FOTO score by >/=10 points to show funcitonal improvement (06/03/2015)   Time 6   Period Weeks   Status New           PT Long Term Goals - 05/04/15 1314    PT LONG TERM GOAL #1   Title Pt. will be I with advanced HEP   Baseline FROM PREVOUSLY DISCHARGED EPISODE   Time 8   Period Weeks   Status On-going   PT LONG TERM GOAL #2   Title Pt. will report 50% reduction in  pain with shoulder abduction movement in order to wash her hair    Baseline FROM PREVOUSLY DISCHARGED EPISODE   Time 6   Period Weeks   Status Achieved   PT LONG TERM GOAL #3   Title Pt. will demo improved strength to 4+/5  without pain (shoulder flexion and abduction)   Baseline 4+/5 flexion and abduction with minimal pain FROM PREVOUSLY DISCHARGED EPISODE   Time 6   Period Weeks   Status Partially Met   PT LONG TERM GOAL #4   Title pt. will report improvement in ability to lift things such as her purse without increased pain (once lifting restrictions are lifted)   Baseline lifting restrictions have not been lifted however she still lifts her purse with LUE and no pain FROM PREVOUSLY DISCHARGED EPISODE   Time 8   Period Weeks   Status Partially Met   PT LONG TERM GOAL #5   Title Pt will demo increased AROM in sitting shoulder flexion from 50 to 110 degrees in order to perform functional activities like reaching into cabinents overhead wthout pain.   Baseline still has painFROM PREVOUSLY DISCHARGED EPISODE   Time 8   Period Weeks   Status Achieved   Additional Long Term Goals   Additional Long Term Goals Yes   PT LONG TERM GOAL #6   Title pt will be I with all HEP at last visit (06/29/2015)   Time 8   Period Weeks   Status New   PT LONG TERM GOAL #7   Title pt will exhibit decreased lumbar parapsinals spams to assist with improved trunk mobility ( 06/29/2015)   Time 8   Period Weeks   Status New   PT LONG TERM GOAL #8   Title pt will improve trunk flexion by > 20 degrees with < 2/10 pain and with no referral of pain down the L leg to assist with ADLs ( 06/29/2015)   Time 8   Period Weeks   Status New   PT LONG TERM GOAL  #9   TITLE pt will be able to tolerated standing and walking for >/= 45 min with <  2/10 pain to assist with job related activities  (06/29/2015)   Time 8   Period Weeks   Status New   PT LONG TERM GOAL  #10   TITLE pt will increase her FOTO score to >/= 64 to demonatreated improved function at discharve ( 06/29/2015)   Time 8    Period Weeks   Status New               Plan - 05/19/15 1121    Clinical Impression Statement Patient stiff and sore with radiating pain down her left leg. Tried massage and lumbar traction this visit. Standing extentions are hurting too bad to perform for her HEP. STM with left paraspinals sore on palpation. Went over HEP and standing extention was too painful to perform.   PT Next Visit Plan assess response to traction, posture edecation, core strengthening and lumbar stretching gentle, iotno?        Problem List Patient Active Problem List   Diagnosis Date Noted  . Diabetic peripheral neuropathy associated with type 2 diabetes mellitus ([place]) 04/10/2015  . Lumbar radiculopathy, chronic 02/18/2014  . Hypertriglyceridemia 01/08/2014  . Diabetes type 2, controlled ([place]) 07/12/2013  . Insomnia 07/12/2013  . Depression 07/12/2013  . Essential hypertension, benign 07/12/2013   Laury Axon, [place] 05/20/2015 1:14 PM [phone] FAX:[phone]  Hessie Diener, PTA 05/20/2015 4:13 PM Phone: [phone] Fax: [facility] [address] [place], [place], [address] Phone: [phone]   Fax:  [phone]  Name: Briana Alvarez MRN: [id number] Date of Birth: Feb 26, 1961

## 2015-05-21 ENCOUNTER — Ambulatory Visit: Payer: Self-pay | Admitting: Physical Therapy

## 2015-05-22 ENCOUNTER — Ambulatory Visit: Payer: Self-pay | Admitting: Family Medicine

## 2015-05-25 ENCOUNTER — Ambulatory Visit: Payer: Self-pay | Attending: Specialist | Admitting: Physical Therapy

## 2015-05-25 DIAGNOSIS — M62830 Muscle spasm of back: Secondary | ICD-10-CM | POA: Insufficient documentation

## 2015-05-25 DIAGNOSIS — R293 Abnormal posture: Secondary | ICD-10-CM | POA: Insufficient documentation

## 2015-05-25 DIAGNOSIS — M2560 Stiffness of unspecified joint, not elsewhere classified: Secondary | ICD-10-CM | POA: Insufficient documentation

## 2015-05-25 DIAGNOSIS — M5442 Lumbago with sciatica, left side: Secondary | ICD-10-CM | POA: Insufficient documentation

## 2015-05-25 DIAGNOSIS — M5386 Other specified dorsopathies, lumbar region: Secondary | ICD-10-CM

## 2015-05-25 DIAGNOSIS — M25512 Pain in left shoulder: Secondary | ICD-10-CM | POA: Insufficient documentation

## 2015-05-25 NOTE — Therapy (Signed)
[place], [place], [address] Phone: [phone]   Fax:  [phone]  Physical Therapy Treatment  Patient Details  Name: Briana Alvarez MRN: [id number] Date of Birth: [birth year] Referring Provider: Dr. Adrian Blackwater  Encounter Date: 05/25/2015      PT End of Session - 05/25/15 1025    Visit Number 3   Number of Visits 16   Date for PT Re-Evaluation 06/29/15   PT Start Time 5170   PT Stop Time 1115   PT Time Calculation (min) 60 min      Past Medical History  Diagnosis Date  . Diabetes mellitus   . Hypertension   . Depression   . Anxiety   . Pneumonia   . Headache     "2 a month"  . Neuropathy ([place])   . Arthritis     Past Surgical History  Procedure Laterality Date  . Abdominal hysterectomy    . Carpal tunnel release    . Ulner nerve     . Tonsillectomy    . Neck surgery    . Knee surgery Left   . Knee arthroscopy Right     There were no vitals filed for this visit.  Visit Diagnosis:  Left-sided low back pain with left-sided sciatica  Decreased ROM of lumbar spine  Muscle spasm of back  Abnormal posture      Subjective Assessment - 05/25/15 1024    Subjective "I didn't do well with traction at all"   Pain Score 5    Pain Location Back   Pain Orientation Left;Mid   Pain Descriptors / Indicators Aching   Pain Type Chronic pain   Pain Radiating Towards to left knee            [facility] PT Assessment - 05/25/15 0001    Strength   Left Hip Flexion 3+/5   Left Hip ABduction 4-/5   Left Hip ADduction 4-/5                     [facility] Adult PT Treatment/Exercise - 05/25/15 1322    Lumbar Exercises: Seated   Other Seated Lumbar Exercises marches with Ab set x 10 reps   Lumbar Exercises: Supine   Ab Set 10 reps;5 seconds   Clam 15 reps   Clam Limitations one leg at a time no weight   Bent Knee Raise 10 reps   Bent Knee Raise Limitations caused pain on left side   Other Supine Lumbar Exercises ball squeeze 5 second holds 20 reps   Moist Heat Therapy   Number Minutes Moist Heat 15 Minutes   Moist Heat Location Lumbar Spine  prone   Electrical Stimulation   Electrical Stimulation Location paraspinals lumbar   Electrical Stimulation Action IFC   Electrical Stimulation Parameters to tol; 15 minutes   Electrical Stimulation Goals Pain   Manual Therapy   Manual Therapy Soft tissue mobilization   Manual therapy comments 12   Soft tissue mobilization prone; lumbar spine and piriformis   Ankle Exercises: Aerobic   Stationary Bike NUstep level 5 8 minutes                  PT Short Term Goals - 05/04/15 1309    PT SHORT TERM GOAL #1   Title Pt will be I with initial HEP   Baseline FROM PREVOUSLY DISCHARGED EPISODE   Period Weeks   Status Achieved   PT SHORT TERM GOAL #2  Title Pt will demonstrate and verbalize understanding of condition management including ice/heat, positioning for comfort   Baseline FROM PREVOUSLY DISCHARGED EPISODE   Time 2   Period Weeks   Status Achieved   PT SHORT TERM GOAL #3   Title Pt. will demo full AROM in supine into shoulder flexion and abduction    Baseline assessed in standing not supine, Full ROM in standing FROM PREVOUSLY DISCHARGED EPISODE   Time 4   Period Weeks   Status Achieved   PT SHORT TERM GOAL #4   Title pt will be I with inital HEP (06/03/2015)   Time 4   Period Weeks   Status New   PT SHORT TERM GOAL #5   Title pt will increase trunk mobility by > 10 degrees with < 5/10 pain  in all directions to assist with ADLs (06/03/2015)   Time 4   Period Weeks   Status New   Additional Short Term Goals   Additional Short Term Goals Yes   PT SHORT TERM GOAL #6   Title pt will be able to verbalize and demonstrate techniques to reduce low back pain and reinjury via postural awarness, lifting and carrying mechanics, and HEP (06/03/2015)   Time 6   Period Weeks   Status New   PT SHORT TERM GOAL  #7   Title pt will increase her FOTO score by >/=10 points to show funcitonal improvement (06/03/2015)   Time 6   Period Weeks   Status New           PT Long Term Goals - 05/04/15 1314    PT LONG TERM GOAL #1   Title Pt. will be I with advanced HEP   Baseline FROM PREVOUSLY DISCHARGED EPISODE   Time 8   Period Weeks   Status On-going   PT LONG TERM GOAL #2   Title Pt. will report 50% reduction in  pain with shoulder abduction movement in order to wash her hair   Baseline FROM PREVOUSLY DISCHARGED EPISODE   Time 6   Period Weeks   Status Achieved   PT LONG TERM GOAL #3   Title Pt. will demo improved strength to 4+/5  without pain (shoulder flexion and abduction)   Baseline 4+/5 flexion and abduction with minimal pain FROM PREVOUSLY DISCHARGED EPISODE   Time 6   Period Weeks   Status Partially Met   PT LONG TERM GOAL #4   Title pt. will report improvement in ability to lift things such as her purse without increased pain (once lifting restrictions are lifted)   Baseline lifting restrictions have not been lifted however she still lifts her purse with LUE and no pain FROM PREVOUSLY DISCHARGED EPISODE   Time 8   Period Weeks   Status Partially Met   PT LONG TERM GOAL #5   Title Pt will demo increased AROM in sitting shoulder flexion from 50 to 110 degrees in order to perform functional activities like reaching into cabinents overhead wthout pain.   Baseline still has painFROM PREVOUSLY DISCHARGED EPISODE   Time 8   Period Weeks   Status Achieved   Additional Long Term Goals   Additional Long Term Goals Yes   PT LONG TERM GOAL #6   Title pt will be I with all HEP at last visit (06/29/2015)   Time 8   Period Weeks   Status New   PT LONG TERM GOAL #7   Title pt will exhibit decreased lumbar parapsinals spams to assist  with improved trunk mobility ( 06/29/2015)   Time 8   Period Weeks   Status New   PT LONG TERM GOAL #8   Title pt will improve trunk flexion by > 20 degrees  with < 2/10 pain and with no referral of pain down the L leg to assist with ADLs ( 06/29/2015)   Time 8   Period Weeks   Status New   PT LONG TERM GOAL  #9   TITLE pt will be able to tolerated standing and walking for >/= 45 min with < 2/10 pain to assist with job related activities  (06/29/2015)   Time 8   Period Weeks   Status New   PT LONG TERM GOAL  #10   TITLE pt will increase her FOTO score to >/= 64 to demonatreated improved function at discharve ( 06/29/2015)   Time 8   Period Weeks   Status New               Plan - 05/25/15 1025    Clinical Impression Statement Patient presents in a lot of pain and stiffness. Patient could not get out of bed after doing traction last session as it left her very sore. Left hip flexor was measured at 3+ out of 5 and her L hip abduction and adduction were measured at 4-/5. She would benefit from continued PT to address these strength deficits and to reduce pain.   PT Next Visit Plan Manual, posture edecation, core strengthening and lumbar stretching gentle, iotno?        Problem List Patient Active Problem List   Diagnosis Date Noted  . Diabetic peripheral neuropathy associated with type 2 diabetes mellitus ([place]) 04/10/2015  . Lumbar radiculopathy, chronic 02/18/2014  . Hypertriglyceridemia 01/08/2014  . Diabetes type 2, controlled ([place]) 07/12/2013  . Insomnia 07/12/2013  . Depression 07/12/2013  . Essential hypertension, benign 07/12/2013   Laury Axon, SPTA 05/25/2015 1:42 PM PHONE:[phone] [facility] [place], [place], [address] Phone: [phone]   Fax:  [phone]  Name: Briana Alvarez MRN: [id number] Date of Birth: 1960/11/07

## 2015-05-27 ENCOUNTER — Ambulatory Visit: Payer: Self-pay | Admitting: Physical Therapy

## 2015-05-27 DIAGNOSIS — M5386 Other specified dorsopathies, lumbar region: Secondary | ICD-10-CM

## 2015-05-27 DIAGNOSIS — M5442 Lumbago with sciatica, left side: Secondary | ICD-10-CM

## 2015-05-27 DIAGNOSIS — R293 Abnormal posture: Secondary | ICD-10-CM

## 2015-05-27 DIAGNOSIS — M62830 Muscle spasm of back: Secondary | ICD-10-CM

## 2015-05-27 NOTE — Therapy (Signed)
[place], [place], [address] Phone: [phone]   Fax:  [phone]  Physical Therapy Treatment  Patient Details  Name: Briana Alvarez MRN: [id number] Date of Birth: 11-30-1960 Referring Provider: Dr. Adrian Blackwater  Encounter Date: 05/27/2015      PT End of Session - 05/27/15 1020    Visit Number 4   Number of Visits 16   Date for PT Re-Evaluation 06/29/15   PT Start Time 1015   PT Stop Time 1049   PT Time Calculation (min) 34 min   Activity Tolerance Patient tolerated treatment well   Behavior During Therapy [facility] for tasks assessed/performed      Past Medical History  Diagnosis Date  . Diabetes mellitus   . Hypertension   . Depression   . Anxiety   . Pneumonia   . Headache     "2 a month"  . Neuropathy ([place])   . Arthritis     Past Surgical History  Procedure Laterality Date  . Abdominal hysterectomy    . Carpal tunnel release    . Ulner nerve     . Tonsillectomy    . Neck surgery    . Knee surgery Left   . Knee arthroscopy Right     There were no vitals filed for this visit.  Visit Diagnosis:  Left-sided low back pain with left-sided sciatica  Decreased ROM of lumbar spine  Muscle spasm of back  Abnormal posture      Subjective Assessment - 05/27/15 1016    Subjective "I am feeling more achey today which I think is due to the weather"   Pain Score 7    Pain Location Back   Pain Orientation Mid;Left   Pain Descriptors / Indicators Aching   Pain Type Chronic pain   Pain Onset More than a month ago   Pain Frequency Constant   Aggravating Factors  prolong sitting   Pain Relieving Factors hot shower and ice.                          [facility] Adult PT Treatment/Exercise - 05/27/15 0001    Moist Heat Therapy   Number Minutes Moist Heat 5 Minutes   Moist Heat Location Lumbar Spine  with pt in prone   Electrical Stimulation   Electrical Stimulation Location  paraspinals lumbar   Electrical Stimulation Action pre mod   Electrical Stimulation Parameters L 26, x 5 min   Electrical Stimulation Goals Pain   Manual Therapy   Manual Therapy Joint mobilization;Myofascial release   Joint Mobilization grade 1 lumbar P>A mobs for pain relief   Soft tissue mobilization instrument assisted STM over bil lumbar paraspinals   Myofascial Release static myofasical release of lumbar paraspinals          Trigger Point Dry Needling - 05/27/15 1043    Consent Given? Yes   Education Handout Provided Yes   Muscles Treated Upper Body Longissimus  lumbar multifidus   Longissimus Response Twitch response elicited;Palpable increased muscle length              PT Education - 05/27/15 1053    Education provided Yes   Education Details dry needling education   Person(s) Educated Patient   Methods Explanation   Comprehension Verbalized understanding          PT Short Term Goals - 05/27/15 1103    PT SHORT TERM GOAL #4   Title pt will  be I with inital HEP (06/03/2015)   Time 4   Period Weeks   Status On-going   PT SHORT TERM GOAL #5   Title pt will increase trunk mobility by > 10 degrees with < 5/10 pain  in all directions to assist with ADLs (06/03/2015)   Time 4   Period Weeks   Status On-going   PT SHORT TERM GOAL #6   Title pt will be able to verbalize and demonstrate techniques to reduce low back pain and reinjury via postural awarness, lifting and carrying mechanics, and HEP (06/03/2015)   Time 6   Period Weeks   Status On-going   PT SHORT TERM GOAL #7   Title pt will increase her FOTO score by >/=10 points to show funcitonal improvement (06/03/2015)   Time 6   Period Weeks   Status On-going           PT Long Term Goals - 05/27/15 1104    PT LONG TERM GOAL #6   Title pt will be I with all HEP at last visit (06/29/2015)   Time 8   Period Weeks   Status On-going   PT LONG TERM GOAL #7   Title pt will exhibit decreased lumbar  parapsinals spams to assist with improved trunk mobility ( 06/29/2015)   Time 8   Period Weeks   Status On-going   PT LONG TERM GOAL #8   Title pt will improve trunk flexion by > 20 degrees with < 2/10 pain and with no referral of pain down the L leg to assist with ADLs ( 06/29/2015)   Time 8   Period Weeks   Status On-going   PT LONG TERM GOAL  #9   TITLE pt will be able to tolerated standing and walking for >/= 45 min with < 2/10 pain to assist with job related activities  (06/29/2015)   Time 8   Period Weeks   Status On-going   PT LONG TERM GOAL  #10   TITLE pt will increase her FOTO score to >/= 64 to demonatreated improved function at discharve ( 06/29/2015)   Time 8   Period Weeks   Status Unable to assess               Plan - 05/27/15 1054    Clinical Impression Statement Briana Alvarez reports feeling more achey today in the low back. Focused todays session on instrument assisted STM and myofascial release of the lumbar paraspinals. Educated pt on dry needling and she provided cosent for dry needling of bil lumbar paraspinals where a deep ache was elicited and relief of tension was noted by the pt following with sitting up straight as well as transitioning from sit to stand. pt repoted having to leave early today due to a appointment.    Pt will benefit from skilled therapeutic intervention in order to improve on the following deficits Decreased range of motion;Dizziness;Pain;Impaired flexibility;Decreased mobility;Decreased strength;Postural dysfunction;Increased muscle spasms;Decreased activity tolerance;Decreased endurance;Hypomobility;Improper body mechanics   Rehab Potential Good   PT Next Visit Plan Assess response to dry needling, Manual, posture edecation, core strengthening and lumbar stretching gentle, iotno?   PT Home Exercise Plan no new HEP given   Consulted and Agree with Plan of Care Patient        Problem List Patient Active Problem List   Diagnosis Date Noted  .  Diabetic peripheral neuropathy associated with type 2 diabetes mellitus ([place]) 04/10/2015  . Lumbar radiculopathy, chronic 02/18/2014  . Hypertriglyceridemia 01/08/2014  .  Diabetes type 2, controlled ([facility]) 07/12/2013  . Insomnia 07/12/2013  . Depression 07/12/2013  . Essential hypertension, benign 07/12/2013   Starr Lake PT, DPT, LAT, ATC  05/27/2015  11:07 AM     [facility] [address] [place], [place], [address] Phone: [phone]   Fax:  [phone]  Name: Briana Alvarez MRN: [id number] Date of Birth: Aug 24, 1960

## 2015-06-01 ENCOUNTER — Ambulatory Visit: Payer: Self-pay | Admitting: Physical Therapy

## 2015-06-01 ENCOUNTER — Telehealth: Payer: Self-pay | Admitting: Family Medicine

## 2015-06-01 DIAGNOSIS — M62830 Muscle spasm of back: Secondary | ICD-10-CM

## 2015-06-01 DIAGNOSIS — M5386 Other specified dorsopathies, lumbar region: Secondary | ICD-10-CM

## 2015-06-01 DIAGNOSIS — M25512 Pain in left shoulder: Secondary | ICD-10-CM

## 2015-06-01 DIAGNOSIS — R293 Abnormal posture: Secondary | ICD-10-CM

## 2015-06-01 DIAGNOSIS — M5442 Lumbago with sciatica, left side: Secondary | ICD-10-CM

## 2015-06-01 NOTE — Patient Instructions (Signed)
IT Band: Leg Hang (Side-Lying)    Lie on side with right leg on top. Keep hip and knee straight. Move top leg behind and hang over edge. Hold __30-6-_ seconds. Relax. Repeat __2-3_ times. Do _2-3__ times a day. Repeat on other side.    Copyright  VHI. All rights reserved.  Knee to Chest (Flexion)    Pull knee toward chest. Feel stretch in lower back or buttock area. Breathing deeply, Hold _30___ seconds. Repeat with other knee. Repeat __3__ times . Do ___2_ sessions per day.  http://gt2.exer.us/226   Copyright  VHI. All rights reserved.  Piriformis Stretch    Lying on back, pull right knee toward opposite shoulder. Hold __30__ seconds. Repeat __3_ times each leg . Do __2__ sessions per day.  http://gt2.exer.us/258   Copyright  VHI. All rights reserved.  Piriformis (Supine)    JUST CROSS LEG AND PUSH KNEE AWAY FROM YOU!  Cross legs, right on top. Gently pull other knee toward chest until stretch is felt in buttock/hip of top leg. Hold __30__ seconds. Repeat __3__ times per set. Do __1__ sets per session. Do __2-3__ sessions per day.  http://orth.exer.us/677   Copyright  VHI. All rights reserved.

## 2015-06-01 NOTE — Telephone Encounter (Signed)
Per OV visit on 04/10/15, "Taper off gabapentin to 600 mg ordered lyrica which will eventually replace gabapentin". Not sure if gabapentin should be refilled.  Nurse sent provider refill request for gabapentin.

## 2015-06-01 NOTE — Therapy (Signed)
[place], [place], [address] Phone: [phone]   Fax:  [phone]  Physical Therapy Treatment  Patient Details  Name: Briana Alvarez MRN: [id number] Date of Birth: 11-10-1960 Referring Provider: Dr. Adrian Blackwater  Encounter Date: 06/01/2015      PT End of Session - 06/01/15 1018    Visit Number 5   Number of Visits 16   Date for PT Re-Evaluation 06/29/15   PT Start Time [id number]   PT Stop Time 1115   PT Time Calculation (min) 60 min      Past Medical History  Diagnosis Date  . Diabetes mellitus   . Hypertension   . Depression   . Anxiety   . Pneumonia   . Headache     "2 a month"  . Neuropathy ([place])   . Arthritis     Past Surgical History  Procedure Laterality Date  . Abdominal hysterectomy    . Carpal tunnel release    . Ulner nerve     . Tonsillectomy    . Neck surgery    . Knee surgery Left   . Knee arthroscopy Right     There were no vitals filed for this visit.  Visit Diagnosis:  No diagnosis found.      Subjective Assessment - 06/01/15 1024    Subjective the needling helped my pain. My pain has moved lower and is still in my left hip.    Currently in Pain? Yes   Pain Score 4    Pain Location Back  and hip   Pain Descriptors / Indicators Aching                         [facility] Adult PT Treatment/Exercise - 06/01/15 1050    Lumbar Exercises: Stretches   Single Knee to Chest Stretch 3 reps;30 seconds   Piriformis Stretch 3 reps;30 seconds   Piriformis Stretch Limitations modified figure four as well as knee to opposite shoulder 3 x 30 sec each bilateral   Lumbar Exercises: Supine   Clam 15 reps   Clam Limitations bilateral   Bent Knee Raise 10 reps   Bent Knee Raise Limitations caused pain on left side   Knee/Hip Exercises: Stretches   ITB Stretch Left;1 rep   Moist Heat Therapy   Number Minutes Moist Heat 15 Minutes   Moist Heat Location Lumbar Spine  and  left hip   Electrical Stimulation   Electrical Stimulation Location left hip   Electrical Stimulation Action IFC   Electrical Stimulation Parameters to tolerance   Electrical Stimulation Goals Pain          Trigger Point Dry Needling - 06/01/15 1109    Consent Given? Yes   Education Handout Provided Yes   Muscles Treated Upper Body Longissimus  multifidus    Muscles Treated Lower Body Gluteus maximus;Gluteus minimus   Longissimus Response Palpable increased muscle length;Twitch response elicited   Gluteus Maximus Response Twitch response elicited;Palpable increased muscle length   Gluteus Minimus Response Twitch response elicited;Palpable increased muscle length              PT Education - 06/01/15 1055    Education provided Yes   Education Details Stretches; piriformis, knee to chest, ITB off edge   Person(s) Educated Patient   Methods Explanation;Handout   Comprehension Verbalized understanding          PT Short Term Goals - 05/27/15 1103    PT  SHORT TERM GOAL #4   Title pt will be I with inital HEP (06/03/2015)   Time 4   Period Weeks   Status On-going   PT SHORT TERM GOAL #5   Title pt will increase trunk mobility by > 10 degrees with < 5/10 pain  in all directions to assist with ADLs (06/03/2015)   Time 4   Period Weeks   Status On-going   PT SHORT TERM GOAL #6   Title pt will be able to verbalize and demonstrate techniques to reduce low back pain and reinjury via postural awarness, lifting and carrying mechanics, and HEP (06/03/2015)   Time 6   Period Weeks   Status On-going   PT SHORT TERM GOAL #7   Title pt will increase her FOTO score by >/=10 points to show funcitonal improvement (06/03/2015)   Time 6   Period Weeks   Status On-going           PT Long Term Goals - 05/27/15 1104    PT LONG TERM GOAL #6   Title pt will be I with all HEP at last visit (06/29/2015)   Time 8   Period Weeks   Status On-going   PT LONG TERM GOAL #7   Title pt  will exhibit decreased lumbar parapsinals spams to assist with improved trunk mobility ( 06/29/2015)   Time 8   Period Weeks   Status On-going   PT LONG TERM GOAL #8   Title pt will improve trunk flexion by > 20 degrees with < 2/10 pain and with no referral of pain down the L leg to assist with ADLs ( 06/29/2015)   Time 8   Period Weeks   Status On-going   PT LONG TERM GOAL  #9   TITLE pt will be able to tolerated standing and walking for >/= 45 min with < 2/10 pain to assist with job related activities  (06/29/2015)   Time 8   Period Weeks   Status On-going   PT LONG TERM GOAL  #10   TITLE pt will increase her FOTO score to >/= 64 to demonatreated improved function at discharve ( 06/29/2015)   Time 8   Period Weeks   Status Unable to assess               Plan - 06/01/15 1056    Clinical Impression Statement Pt reports decreased pain after last dry needling session. PT available to perfrom additional dry needling during her session today. Instructed pt in stretches to perfrom 2-3 times per day after Dry needling. Estim and heat post treatment to decrease pain.    PT Next Visit Plan Assess response to dry needling, Manual, posture edecation, core strengthening and lumbar stretching gentle, iotno?     Patient provided consent for PT to perform dry needling of the L glute medius/ minimus and bil lumbar spine mulftifidus. She reported multiple twitches in both areas with the gluteus maximus/ medius/ minimus > multifidus. Following she reported reduction  in pain and tightness. Utilized E-stim and heat to calm down any additional soreness following DN.    Problem List Patient Active Problem List   Diagnosis Date Noted  . Diabetic peripheral neuropathy associated with type 2 diabetes mellitus ([place]) 04/10/2015  . Lumbar radiculopathy, chronic 02/18/2014  . Hypertriglyceridemia 01/08/2014  . Diabetes type 2, controlled ([place]) 07/12/2013  . Insomnia 07/12/2013  . Depression 07/12/2013   . Essential hypertension, benign 07/12/2013   Hessie Diener, PTA 06/01/2015 11:18 AM  Phone: [phone] Fax: [phone]  Starr Lake PT, DPT, LAT, ATC  06/01/2015  11:18 AM    [facility] [address] [place], [place], [address] Phone: [phone]   Fax:  [phone]  Name: Briana Alvarez MRN: [id number] Date of Birth: [birth year]

## 2015-06-03 ENCOUNTER — Ambulatory Visit: Payer: Self-pay | Admitting: Physical Therapy

## 2015-06-03 NOTE — Telephone Encounter (Signed)
Pleas call patient to check if she has obtained lyrica. If not, please refill gabapentin.

## 2015-06-05 ENCOUNTER — Other Ambulatory Visit: Payer: Self-pay

## 2015-06-05 DIAGNOSIS — E1142 Type 2 diabetes mellitus with diabetic polyneuropathy: Secondary | ICD-10-CM

## 2015-06-05 MED ORDER — GABAPENTIN 300 MG PO CAPS: 600.0000 mg | ORAL_CAPSULE | Freq: Three times a day (TID) | ORAL | Status: DC

## 2015-06-05 NOTE — Telephone Encounter (Signed)
Patient called to check on the status of med refill for medication, please f/u

## 2015-06-05 NOTE — Telephone Encounter (Signed)
Patient called requesting gabapentin refill. Patient is not taking lyrica at this time and will not have lyrica for 6 weeks. Nurse sent Dr. Adrian Blackwater message due to patient requesting gabapentin 300mg , 3 caps, TID. Nurse cal refill gabapentin 300mg , 2 caps, TID. Due to difference in medication listed and medication being requested nurse sent request to provider.

## 2015-06-08 ENCOUNTER — Ambulatory Visit: Payer: Self-pay | Admitting: Physical Therapy

## 2015-06-08 DIAGNOSIS — M5386 Other specified dorsopathies, lumbar region: Secondary | ICD-10-CM

## 2015-06-08 DIAGNOSIS — R293 Abnormal posture: Secondary | ICD-10-CM

## 2015-06-08 DIAGNOSIS — M62830 Muscle spasm of back: Secondary | ICD-10-CM

## 2015-06-08 DIAGNOSIS — M5442 Lumbago with sciatica, left side: Secondary | ICD-10-CM

## 2015-06-08 NOTE — Therapy (Signed)
[place], [place], [address] Phone: [phone]   Fax:  [phone]  Physical Therapy Treatment  Patient Details  Name: Briana Alvarez MRN: [id number] Date of Birth: Nov 26, 1960 Referring Provider: Dr. Adrian Blackwater  Encounter Date: 06/08/2015      PT End of Session - 06/08/15 1245    Visit Number 6   Number of Visits 16   Date for PT Re-Evaluation 06/29/15   PT Start Time [id number]   PT Stop Time 1112   PT Time Calculation (min) 57 min      Past Medical History  Diagnosis Date  . Diabetes mellitus   . Hypertension   . Depression   . Anxiety   . Pneumonia   . Headache     "2 a month"  . Neuropathy ([place])   . Arthritis     Past Surgical History  Procedure Laterality Date  . Abdominal hysterectomy    . Carpal tunnel release    . Ulner nerve     . Tonsillectomy    . Neck surgery    . Knee surgery Left   . Knee arthroscopy Right     There were no vitals filed for this visit.  Visit Diagnosis:  Left-sided low back pain with left-sided sciatica  Decreased ROM of lumbar spine  Muscle spasm of back  Abnormal posture      Subjective Assessment - 06/08/15 1017    Subjective "i am doing alot better, I stil lhave alittle pain down in the back, the hip is much better"    Currently in Pain? Yes   Pain Score 3    Pain Location Back   Pain Orientation Mid;Left   Pain Descriptors / Indicators Aching   Pain Type Chronic pain   Pain Onset More than a month ago   Aggravating Factors  walking,    Pain Relieving Factors hot shower, ice.                          [facility] Adult PT Treatment/Exercise - 06/08/15 1025    Lumbar Exercises: Stretches   Single Knee to Chest Stretch 30 seconds;2 reps   Double Knee to Chest Stretch 5 reps;30 seconds   Lower Trunk Rotation 30 seconds;2 reps   Pelvic Tilt 4 reps;30 seconds  in prone   Piriformis Stretch 2 reps;30 seconds   Lumbar Exercises:  Aerobic   Stationary Bike L1 x 5 min   Lumbar Exercises: Supine   Ab Set 10 reps;5 seconds   Moist Heat Therapy   Number Minutes Moist Heat 10 Minutes   Moist Heat Location Lumbar Spine  in prone   Electrical Stimulation   Electrical Stimulation Location low back   Electrical Stimulation Action IFC   Electrical Stimulation Parameters L 11, 100% scan, x 10 min   Electrical Stimulation Goals Pain   Manual Therapy   Soft tissue mobilization instrument assisted STM over bil lumbar paraspinals   Myofascial Release static myofasical release of lumbar paraspinals, manual trigger point release of R lumbar paraspinal                PT Education - 06/08/15 1246    Education provided Yes   Education Details back nobber education.    Person(s) Educated Patient   Methods Explanation   Comprehension Verbalized understanding          PT Short Term Goals - 06/08/15 1248    PT SHORT TERM  GOAL #4   Title pt will be I with inital HEP (06/03/2015)   Time 4   Period Weeks   Status On-going   PT SHORT TERM GOAL #5   Title pt will increase trunk mobility by > 10 degrees with < 5/10 pain  in all directions to assist with ADLs (06/03/2015)   Time 4   Period Weeks   Status On-going   PT SHORT TERM GOAL #6   Title pt will be able to verbalize and demonstrate techniques to reduce low back pain and reinjury via postural awarness, lifting and carrying mechanics, and HEP (06/03/2015)   Time 6   Period Weeks   Status On-going   PT SHORT TERM GOAL #7   Title pt will increase her FOTO score by >/=10 points to show funcitonal improvement (06/03/2015)   Time 6   Period Weeks   Status On-going           PT Long Term Goals - 06/08/15 1249    PT LONG TERM GOAL #6   Title pt will be I with all HEP at last visit (06/29/2015)   Time 8   Period Weeks   Status On-going   PT LONG TERM GOAL #7   Title pt will exhibit decreased lumbar parapsinals spams to assist with improved trunk mobility (  06/29/2015)   Time 8   Period Weeks   Status On-going   PT LONG TERM GOAL #8   Title pt will improve trunk flexion by > 20 degrees with < 2/10 pain and with no referral of pain down the L leg to assist with ADLs ( 06/29/2015)   Time 8   Period Weeks   Status On-going   PT LONG TERM GOAL  #9   TITLE pt will be able to tolerated standing and walking for >/= 45 min with < 2/10 pain to assist with job related activities  (06/29/2015)   Time 8   Period Weeks   Status On-going   PT LONG TERM GOAL  #10   TITLE pt will increase her FOTO score to >/= 64 to demonatreated improved function at discharve ( 06/29/2015)   Time 8   Period Weeks   Status Unable to assess               Plan - 06/08/15 1246    Clinical Impression Statement Briana Alvarez reports that she is doing better today with report that the needling has really helped but would like to hold off on it today. Following manual she reported that she had no pain following todays session. plan to progress with [facility] exercises.    PT Next Visit Plan dry needling PRN, Manual, posture edecation, core strengthening and lumbar stretching gentle, iotno   PT Home Exercise Plan no new HEP given   Consulted and Agree with Plan of Care Patient        Problem List Patient Active Problem List   Diagnosis Date Noted  . Diabetic peripheral neuropathy associated with type 2 diabetes mellitus ([place]) 04/10/2015  . Lumbar radiculopathy, chronic 02/18/2014  . Hypertriglyceridemia 01/08/2014  . Diabetes type 2, controlled ([place]) 07/12/2013  . Insomnia 07/12/2013  . Depression 07/12/2013  . Essential hypertension, benign 07/12/2013   Starr Lake PT, DPT, LAT, ATC  06/08/2015  12:54 PM     [facility] [address] [place], [place], [address] Phone: [phone]   Fax:  [phone]  Name: Briana Alvarez MRN: [id number] Date of Birth: 25-Dec-1960

## 2015-06-09 ENCOUNTER — Encounter: Payer: Self-pay | Admitting: Family Medicine

## 2015-06-09 ENCOUNTER — Ambulatory Visit: Payer: Self-pay | Attending: Family Medicine | Admitting: Family Medicine

## 2015-06-09 VITALS — BP 116/83 | HR 65 | Temp 98.7°F | Resp 16 | Ht 66.5 in | Wt 235.0 lb

## 2015-06-09 DIAGNOSIS — M5416 Radiculopathy, lumbar region: Secondary | ICD-10-CM

## 2015-06-09 DIAGNOSIS — E119 Type 2 diabetes mellitus without complications: Secondary | ICD-10-CM

## 2015-06-09 DIAGNOSIS — R251 Tremor, unspecified: Secondary | ICD-10-CM

## 2015-06-09 LAB — COMPLETE METABOLIC PANEL WITH GFR
ALBUMIN: 4 g/dL (ref 3.6–5.1)
ALK PHOS: 92 U/L (ref 33–130)
ALT: 14 U/L (ref 6–29)
AST: 16 U/L (ref 10–35)
BUN: 12 mg/dL (ref 7–25)
CALCIUM: 9.3 mg/dL (ref 8.6–10.4)
CHLORIDE: 101 mmol/L (ref 98–110)
CO2: 24 mmol/L (ref 20–31)
Creat: 0.98 mg/dL (ref 0.50–1.05)
GFR, Est African American: 75 mL/min (ref >=60)
GFR, Est Non African American: 65 mL/min (ref >=60)
Glucose, Bld: 89 mg/dL (ref 65–99)
POTASSIUM: 4.2 mmol/L (ref 3.5–5.3)
Sodium: 138 mmol/L (ref 135–146)
Total Bilirubin: 0.6 mg/dL (ref 0.2–1.2)
Total Protein: 6.2 g/dL (ref 6.1–8.1)

## 2015-06-09 LAB — GLUCOSE, POCT (MANUAL RESULT ENTRY): POC Glucose: 74 mg/dl (ref 70–99)

## 2015-06-09 NOTE — Patient Instructions (Addendum)
Briana Alvarez was seen today for back pain and tremors.  Diagnoses and all orders for this visit:  Controlled type 2 diabetes mellitus without complication, without long-term current use of insulin ([facility]) -     POCT glucose (manual entry)  Lumbar radiculopathy, chronic -     Ambulatory referral to Pain Clinic  Tremor of both hands -     COMPLETE METABOLIC PANEL WITH GFR -     TSH -     Vitamin B12 -     Ambulatory referral to Physical Therapy    You will be called with lab results ordered to evaluate tremors. PT ordered for hands as well.   F/u in 6-8 weeks for hand tremors  Dr. Adrian Blackwater

## 2015-06-09 NOTE — Assessment & Plan Note (Signed)
A; chronic back pain. Pain not controlled with tylenol #3 P: Pain management referral

## 2015-06-09 NOTE — Progress Notes (Signed)
C/C hand shaking and back pain Pain scale #7 Tylenol #3 not helping No tobacco user, no ETOH  No suicidal thought in the past two weeks

## 2015-06-09 NOTE — Progress Notes (Signed)
Patient ID: Briana Alvarez, female   DOB: March 12, 1961, 54 y.o.   MRN: [id number]   Subjective:  Patient ID: Briana Alvarez, female    DOB: Jul 28, 1960  Age: 54 y.o. MRN: [id number]  CC: Back Pain and Tremors   HPI Briana Alvarez presents for    1. Back pain: chronic low back pain. Having PT. Doing acupuncture. Worsening pain after acupuncture. Tylenol #3 is not helping much. vicodin was better. She has tried tramadol without relief. She has tried flexeril without relief. I informed her that per prescribing practices I do not prescribe vicodin.   2. Tremors in hands: x 2 months. Tremors come during typing. Hand feels weak. Taking classes on line, so she types often. Both hands are affected. She is R handed. She is s/p R carpal tunnel and ulnar nerve release. She does also have aches in both hands. No personal or family hx of tremor. No ETOH. Sleeping well. No recent medication changes.   Past Surgical History  Procedure Laterality Date  . Abdominal hysterectomy    . Carpal tunnel release    . Ulner nerve     . Tonsillectomy    . Neck surgery    . Knee surgery Left   . Knee arthroscopy Right     Social History  Substance Use Topics  . Smoking status: Never Smoker   . Smokeless tobacco: Never Used  . Alcohol Use: No    Outpatient Prescriptions Prior to Visit  Medication Sig Dispense Refill  . acetaminophen-codeine (TYLENOL #3) 300-30 MG tablet Take 1 tablet by mouth every 8 (eight) hours as needed for moderate pain. 60 tablet 2  . benazepril (LOTENSIN) 10 MG tablet Take 1 tablet (10 mg total) by mouth daily. 30 tablet 11  . Brexpiprazole (REXULTI) 1 MG TABS Take by mouth.    . DULoxetine (CYMBALTA) 60 MG capsule Take 1 capsule (60 mg total) by mouth daily. 120 capsule 3  . gabapentin (NEURONTIN) 300 MG capsule Take 2 capsules (600 mg total) by mouth 3 (three) times daily. 180 capsule 1  . glipiZIDE (GLUCOTROL) 10 MG tablet TAKE 1 TABLET BY MOUTH 2 TIMES DAILY BEFORE A MEAL 60 tablet 5  .  glucose monitoring kit (FREESTYLE) monitoring kit 1 each by Does not apply route as needed for other. Dispense any model that is covered- dispense testing supplies for Q AC/ HS accuchecks- 1 month supply with one refil. 1 each 1  . metFORMIN (GLUCOPHAGE) 1000 MG tablet TAKE 1 TABLET BY MOUTH 2 TIMES DAILY WITH A MEAL. 60 tablet 3  . traZODone (DESYREL) 150 MG tablet Take 150 mg by mouth at bedtime.    Marland Kitchen albuterol (PROVENTIL HFA;VENTOLIN HFA) 108 (90 BASE) MCG/ACT inhaler Inhale 2 puffs into the lungs every 6 (six) hours as needed for wheezing or shortness of breath. 1 Inhaler 2  . HYDROcodone-acetaminophen (NORCO/VICODIN) 5-325 MG tablet Take 1 tablet by mouth every 6 (six) hours as needed for moderate pain. (Patient not taking: Reported on 06/09/2015) 30 tablet 0  . oxycodone (OXY-IR) 5 MG capsule Take 5 mg by mouth every 4 (four) hours as needed. Reported on 06/09/2015    . pregabalin (LYRICA) 50 MG capsule Take 1 capsule (50 mg total) by mouth 3 (three) times daily. (Patient not taking: Reported on 06/09/2015) 90 capsule 3   No facility-administered medications prior to visit.    ROS Review of Systems  Constitutional: Negative for fever and chills.  Eyes: Negative for visual disturbance.  Respiratory: Negative for shortness  of breath.   Cardiovascular: Negative for chest pain.  Gastrointestinal: Negative for abdominal pain and blood in stool.  Musculoskeletal: Positive for myalgias, back pain, arthralgias and gait problem.  Skin: Negative for rash.  Allergic/Immunologic: Negative for immunocompromised state.  Neurological: Positive for tremors.  Hematological: Negative for adenopathy. Does not bruise/bleed easily.  Psychiatric/Behavioral: Negative for suicidal ideas and dysphoric mood.    Objective:  BP 116/83 mmHg  Pulse 65  Temp(Src) 98.7 F (37.1 C) (Oral)  Resp 16  Ht 5' 6.5" (1.689 m)  Wt 235 lb (106.595 kg)  BMI 37.37 kg/m2  SpO2 98%  BP/Weight 06/09/2015 04/20/2015  82/64/1583  Systolic BP 094 076 808  Diastolic BP 83 89 87  Wt. (Lbs) 235 - 232  BMI 37.37 - 36.89   Physical Exam  Constitutional: She is oriented to person, place, and time. She appears well-developed and well-nourished. No distress.  HENT:  Head: Normocephalic and atraumatic.  Cardiovascular: Normal rate, regular rhythm, normal heart sounds and intact distal pulses.   Pulses:      Radial pulses are 2+ on the right side, and 2+ on the left side.  Pulmonary/Chest: Effort normal and breath sounds normal.  Musculoskeletal: She exhibits no edema.  Normal grip strength in hands Negative phalen    Neurological: She is alert and oriented to person, place, and time.  Skin: Skin is warm and dry. No rash noted. No erythema. No pallor.  Psychiatric: She has a normal mood and affect.   Lab Results  Component Value Date   HGBA1C 5.90 04/10/2015   CBG 74  Assessment & Plan:   Problem List Items Addressed This Visit    Diabetes type 2, controlled ([place]) - Primary (Chronic)   Relevant Orders   POCT glucose (manual entry) (Completed)   Lumbar radiculopathy, chronic (Chronic)    A; chronic back pain. Pain not controlled with tylenol #3 P: Pain management referral       Relevant Orders   Ambulatory referral to Pain Clinic   Tremor of both hands    Termor and pain in hands suspect carpal tunnel PT ordered Checking TSH, vit b12 and calcium as well       Relevant Orders   COMPLETE METABOLIC PANEL WITH GFR   TSH   Vitamin B12   Ambulatory referral to Physical Therapy      No orders of the defined types were placed in this encounter.    Follow-up: No Follow-up on file.   Boykin Nearing MD

## 2015-06-09 NOTE — Assessment & Plan Note (Signed)
Termor and pain in hands suspect carpal tunnel PT ordered Checking TSH, vit b12 and calcium as well

## 2015-06-10 ENCOUNTER — Ambulatory Visit: Payer: Self-pay | Admitting: Physical Therapy

## 2015-06-10 LAB — VITAMIN B12: VITAMIN B 12: 264 pg/mL (ref 211–911)

## 2015-06-10 LAB — TSH: TSH: 2.934 u[IU]/mL (ref 0.350–4.500)

## 2015-06-17 ENCOUNTER — Ambulatory Visit: Payer: Self-pay | Admitting: Physical Therapy

## 2015-06-17 DIAGNOSIS — M5442 Lumbago with sciatica, left side: Secondary | ICD-10-CM

## 2015-06-17 DIAGNOSIS — M62830 Muscle spasm of back: Secondary | ICD-10-CM

## 2015-06-17 DIAGNOSIS — R293 Abnormal posture: Secondary | ICD-10-CM

## 2015-06-17 DIAGNOSIS — M5386 Other specified dorsopathies, lumbar region: Secondary | ICD-10-CM

## 2015-06-17 NOTE — Therapy (Addendum)
[facility] [address] [place], [place], [address] Phone: [phone]   Fax:  [phone]  Physical Therapy Treatment  Patient Details  Name: Briana Alvarez MRN: [id number] Date of Birth: 1961-01-21 Referring Provider: Dr. Armen Pickup  Encounter Date: 06/17/2015      PT End of Session - 06/17/15 1027    Visit Number 7   Number of Visits 16   Date for PT Re-Evaluation 06/29/15   PT Start Time 0930   PT Stop Time 1023   PT Time Calculation (min) 53 min   Activity Tolerance Patient tolerated treatment well   Behavior During Therapy [facility] for tasks assessed/performed      Past Medical History  Diagnosis Date  . Diabetes mellitus   . Hypertension   . Depression   . Anxiety   . Pneumonia   . Headache     "2 a month"  . Neuropathy (HCC)   . Arthritis     Past Surgical History  Procedure Laterality Date  . Abdominal hysterectomy    . Carpal tunnel release    . Ulner nerve     . Tonsillectomy    . Neck surgery    . Knee surgery Left   . Knee arthroscopy Right     There were no vitals filed for this visit.  Visit Diagnosis:  No diagnosis found.      Subjective Assessment - 06/17/15 0930    Subjective "I am having more pain in the lower back and l hip today"   Currently in Pain? Yes   Pain Score 4    Pain Location Back   Pain Orientation Left;Mid   Pain Descriptors / Indicators Aching   Pain Type Chronic pain   Pain Onset More than a month ago   Pain Frequency Constant   Aggravating Factors  getting up and moving around   Pain Relieving Factors hot shower, ice                         [facility] Adult PT Treatment/Exercise - 06/17/15 0001    Lumbar Exercises: Prone   Other Prone Lumbar Exercises multifidus activation prone with pelvic tilt 2 x 10   Knee/Hip Exercises: Stretches   Active Hamstring Stretch 2 reps;30 seconds   Quad Stretch 2 reps;30 seconds   ITB Stretch Left;1 rep   Moist Heat  Therapy   Number Minutes Moist Heat 15 Minutes   Moist Heat Location Lumbar Spine  supine   Electrical Stimulation   Electrical Stimulation Location low back   Electrical Stimulation Action IFC   Electrical Stimulation Parameters 15 min, L 14    Electrical Stimulation Goals Pain   Manual Therapy   Joint Mobilization grade 1 lumbar P>A mobs for pain relief   Soft tissue mobilization instrument assisted STM over bil lumbar paraspinals,    Myofascial Release static myofasical release of lumbar paraspinals, manual trigger point release of R lumbar paraspinal. and over L glute and piriformis                PT Education - 06/17/15 1026    Education provided No          PT Short Term Goals - 06/08/15 1248    PT SHORT TERM GOAL #4   Title pt will be I with inital HEP (06/03/2015)   Time 4   Period Weeks   Status On-going   PT SHORT TERM GOAL #5   Title pt  will increase trunk mobility by > 10 degrees with < 5/10 pain  in all directions to assist with ADLs (06/03/2015)   Time 4   Period Weeks   Status On-going   PT SHORT TERM GOAL #6   Title pt will be able to verbalize and demonstrate techniques to reduce low back pain and reinjury via postural awarness, lifting and carrying mechanics, and HEP (06/03/2015)   Time 6   Period Weeks   Status On-going   PT SHORT TERM GOAL #7   Title pt will increase her FOTO score by >/=10 points to show funcitonal improvement (06/03/2015)   Time 6   Period Weeks   Status On-going           PT Long Term Goals - 06/08/15 1249    PT LONG TERM GOAL #6   Title pt will be I with all HEP at last visit (06/29/2015)   Time 8   Period Weeks   Status On-going   PT LONG TERM GOAL #7   Title pt will exhibit decreased lumbar parapsinals spams to assist with improved trunk mobility ( 06/29/2015)   Time 8   Period Weeks   Status On-going   PT LONG TERM GOAL #8   Title pt will improve trunk flexion by > 20 degrees with < 2/10 pain and with no  referral of pain down the L leg to assist with ADLs ( 06/29/2015)   Time 8   Period Weeks   Status On-going   PT LONG TERM GOAL  #9   TITLE pt will be able to tolerated standing and walking for >/= 45 min with < 2/10 pain to assist with job related activities  (06/29/2015)   Time 8   Period Weeks   Status On-going   PT LONG TERM GOAL  #10   TITLE pt will increase her FOTO score to >/= 64 to demonatreated improved function at discharve ( 06/29/2015)   Time 8   Period Weeks   Status Unable to assess               Plan - 06/17/15 1027    Clinical Impression Statement Cadence reports that she is feeling alittle more tight today. Focused todays session on decreasing tightness in the low back and L hip which she reported relief following manual and exercise. Following todays sessionw ith e-stim and heat  to decrease any additional soreness.    PT Next Visit Plan dry needling PRN, Manual, posture edecation, core strengthening and lumbar stretching gentle, iotno   PT Home Exercise Plan no new HEP given        Problem List Patient Active Problem List   Diagnosis Date Noted  . Tremor of both hands 06/09/2015  . Diabetic peripheral neuropathy associated with type 2 diabetes mellitus (HCC) 04/10/2015  . Lumbar radiculopathy, chronic 02/18/2014  . Hypertriglyceridemia 01/08/2014  . Diabetes type 2, controlled (HCC) 07/12/2013  . Insomnia 07/12/2013  . Depression 07/12/2013  . Essential hypertension, benign 07/12/2013   Lulu Riding PT, DPT, LAT, ATC  06/17/2015  10:41 AM      [facility] [address] [place], [place], [address] Phone: [phone]   Fax:  [phone]  Name: Kawena Gally MRN: [id number] Date of Birth: 29-Sep-1960   PHYSICAL THERAPY DISCHARGE SUMMARY  Visits from Start of Care: 7  Current functional level related to goals / functional outcomes: See goals   Remaining deficits: Unknown due to not  returning  Education / Equipment: HEP  Plan:                                                    Patient goals were not met. Patient is being discharged due to not returning since the last visit.  ?????        Lowell Makara PT, DPT, LAT, ATC  08/07/2015  8:25 AM

## 2015-06-19 ENCOUNTER — Ambulatory Visit: Payer: Self-pay | Admitting: Occupational Therapy

## 2015-06-23 NOTE — Telephone Encounter (Signed)
Pt. Called requesting her lab results. Please f/u with pt.  °

## 2015-06-24 ENCOUNTER — Telehealth: Payer: Self-pay | Admitting: *Deleted

## 2015-06-24 ENCOUNTER — Ambulatory Visit: Payer: No Typology Code available for payment source | Attending: Family Medicine | Admitting: Physical Therapy

## 2015-06-24 ENCOUNTER — Telehealth: Payer: Self-pay | Admitting: Physical Therapy

## 2015-06-24 NOTE — Telephone Encounter (Signed)
-----   Message from Boykin Nearing, MD sent at 06/10/2015  8:19 AM EST ----- All labs TSH, vit B12 and CMP obtained to check calcium are normal

## 2015-06-24 NOTE — Telephone Encounter (Signed)
Date of Birth verified by pt  Normal lab results given  Pt verbalized understanding

## 2015-06-24 NOTE — Telephone Encounter (Signed)
LVM that pt missed todays visit and when her next visit was and if she had any questions to call us back.

## 2015-06-25 MED FILL — DULoxetine HCL 60 MG CPEP: 60 | 30 days supply | Qty: 30 | Fill #5

## 2015-06-25 MED FILL — glipiZIDE 10 MG TABS: 10 | 30 days supply | Qty: 60 | Fill #2

## 2015-06-25 MED FILL — metFORMIN HCL 1000 MG TABS: 1000 | 30 days supply | Qty: 60 | Fill #2

## 2015-06-25 MED FILL — BENAZEPRIL HCL 10 MG TABLET: 10 | 30 days supply | Qty: 30 | Fill #10

## 2015-06-26 ENCOUNTER — Ambulatory Visit: Payer: No Typology Code available for payment source | Admitting: Physical Therapy

## 2015-06-26 MED FILL — GABAPENTIN 300 MG CAPSULE: 300 | 30 days supply | Qty: 180 | Fill #1

## 2015-07-14 ENCOUNTER — Telehealth: Payer: Self-pay | Admitting: Family Medicine

## 2015-07-14 DIAGNOSIS — E1142 Type 2 diabetes mellitus with diabetic polyneuropathy: Secondary | ICD-10-CM

## 2015-07-14 MED ORDER — GABAPENTIN 300 MG PO CAPS: 900.0000 mg | ORAL_CAPSULE | Freq: Three times a day (TID) | ORAL | Status: DC

## 2015-07-14 NOTE — Telephone Encounter (Signed)
Patient called stating that Lyrica patient assistance is behind on applications. Patient would like to know if she can switch back to the gabapentin. Patient state that she was taking 3 pills, 3 times a day. And needs to go back to that. Please follow up with patient.

## 2015-07-14 NOTE — Telephone Encounter (Signed)
Gabapentin 900mg TID refilled

## 2015-07-15 NOTE — Telephone Encounter (Signed)
Pt notified Rx send to [facility]

## 2015-07-17 MED FILL — GABAPENTIN 300 MG CAPSULE: 300 | 30 days supply | Qty: 270 | Fill #0

## 2015-07-29 ENCOUNTER — Telehealth: Payer: Self-pay | Admitting: Family Medicine

## 2015-07-29 MED FILL — metFORMIN HCL 1000 MG TABS: 1000 | 30 days supply | Qty: 60 | Fill #3

## 2015-07-29 MED FILL — glipiZIDE 10 MG TABS: 10 | 30 days supply | Qty: 60 | Fill #3

## 2015-07-29 MED FILL — BENAZEPRIL HCL 10 MG TABLET: 10 | 30 days supply | Qty: 30 | Fill #11

## 2015-07-29 NOTE — Telephone Encounter (Signed)
Patient received lyrica in the mail and would like to know if she needs to reduce her gabapentin or should she be taking both with no change....please follow up with patient

## 2015-07-30 NOTE — Telephone Encounter (Signed)
Pt advised to continue taking Lyrica  Stop taking gabapentin  Gabapentin D/C form Pt medicine Hx

## 2015-08-05 MED FILL — DULoxetine HCL 60 MG CPEP: 60 | 30 days supply | Qty: 30 | Fill #6

## 2015-08-06 ENCOUNTER — Other Ambulatory Visit: Payer: Self-pay | Admitting: Family Medicine

## 2015-08-06 ENCOUNTER — Encounter: Payer: Self-pay | Admitting: Family Medicine

## 2015-08-06 ENCOUNTER — Ambulatory Visit: Payer: Medicaid Other | Attending: Family Medicine | Admitting: Family Medicine

## 2015-08-06 VITALS — BP 132/86 | HR 72 | Temp 98.6°F | Resp 16 | Ht 66.0 in | Wt 236.0 lb

## 2015-08-06 DIAGNOSIS — R251 Tremor, unspecified: Secondary | ICD-10-CM | POA: Diagnosis not present

## 2015-08-06 DIAGNOSIS — Z113 Encounter for screening for infections with a predominantly sexual mode of transmission: Secondary | ICD-10-CM

## 2015-08-06 DIAGNOSIS — E1142 Type 2 diabetes mellitus with diabetic polyneuropathy: Secondary | ICD-10-CM

## 2015-08-06 DIAGNOSIS — F32A Depression, unspecified: Secondary | ICD-10-CM

## 2015-08-06 DIAGNOSIS — Z1159 Encounter for screening for other viral diseases: Secondary | ICD-10-CM

## 2015-08-06 DIAGNOSIS — Z1231 Encounter for screening mammogram for malignant neoplasm of breast: Secondary | ICD-10-CM | POA: Diagnosis not present

## 2015-08-06 DIAGNOSIS — F329 Major depressive disorder, single episode, unspecified: Secondary | ICD-10-CM

## 2015-08-06 DIAGNOSIS — E119 Type 2 diabetes mellitus without complications: Secondary | ICD-10-CM

## 2015-08-06 LAB — POCT GLYCOSYLATED HEMOGLOBIN (HGB A1C): Hemoglobin A1C: 5.8

## 2015-08-06 LAB — GLUCOSE, POCT (MANUAL RESULT ENTRY): POC GLUCOSE: 98 mg/dL (ref 70–99)

## 2015-08-06 MED ORDER — GABAPENTIN 300 MG PO CAPS
900.0000 mg | ORAL_CAPSULE | Freq: Three times a day (TID) | ORAL | Status: DC
Start: 2015-08-06 — End: 2015-09-03

## 2015-08-06 MED ORDER — DULOXETINE HCL 30 MG PO CPEP: 30.0000 mg | ORAL_CAPSULE | Freq: Every day | ORAL | Status: DC

## 2015-08-06 MED ORDER — BUPROPION HCL ER (XL) 150 MG PO TB24
150.0000 mg | ORAL_TABLET | Freq: Every day | ORAL | Status: DC
Start: 2015-08-06 — End: 2015-10-19

## 2015-08-06 MED ORDER — DULOXETINE HCL 60 MG PO CPEP: 60.0000 mg | ORAL_CAPSULE | Freq: Every day | ORAL | Status: DC

## 2015-08-06 MED FILL — GABAPENTIN 300 MG CAPSULE: 300 | 30 days supply | Qty: 270 | Fill #0

## 2015-08-06 MED FILL — ?BUPROPION HCL XL 150 MG TA: 150 | 30 days supply | Qty: 30 | Fill #0

## 2015-08-06 NOTE — Assessment & Plan Note (Signed)
A; depression with sexual dysfunction P: Taper down Cymbalta Keep trazodone the same Add wellbutrin  Close f/u

## 2015-08-06 NOTE — Assessment & Plan Note (Signed)
Resolved

## 2015-08-06 NOTE — Progress Notes (Signed)
Patient ID: Briana Alvarez, female   DOB: 17-Jan-1961, 55 y.o.   MRN: [id number]   Subjective:  Patient ID: Briana Alvarez, female    DOB: 03/21/1961  Age: 55 y.o. MRN: [id number]  CC: No chief complaint on file.   HPI Briana Alvarez presents for    1. Tremors in hands: resolved. No pain.   2. Requesting STD testing: has a new partner. Request STD screen. No genital lesions, discharge or pain.   3. Delayed orgasm: having trouble achieving orgasm. Taking rexulti, trazodone and cymbalta. This is bothersome. Would be willing to adjust medications. Reports mood is doing well.   Social History  Substance Use Topics  . Smoking status: Never Smoker   . Smokeless tobacco: Never Used  . Alcohol Use: No   Past Surgical History  Procedure Laterality Date  . Abdominal hysterectomy    . Carpal tunnel release    . Ulner nerve     . Tonsillectomy    . Neck surgery    . Knee surgery Left   . Knee arthroscopy Right     Social History  Substance Use Topics  . Smoking status: Never Smoker   . Smokeless tobacco: Never Used  . Alcohol Use: No    Outpatient Prescriptions Prior to Visit  Medication Sig Dispense Refill  . acetaminophen-codeine (TYLENOL #3) 300-30 MG tablet Take 1 tablet by mouth every 8 (eight) hours as needed for moderate pain. 60 tablet 2  . albuterol (PROVENTIL HFA;VENTOLIN HFA) 108 (90 BASE) MCG/ACT inhaler Inhale 2 puffs into the lungs every 6 (six) hours as needed for wheezing or shortness of breath. 1 Inhaler 2  . benazepril (LOTENSIN) 10 MG tablet Take 1 tablet (10 mg total) by mouth daily. 30 tablet 11  . Brexpiprazole (REXULTI) 1 MG TABS Take by mouth.    . DULoxetine (CYMBALTA) 60 MG capsule Take 1 capsule (60 mg total) by mouth daily. 120 capsule 3  . glipiZIDE (GLUCOTROL) 10 MG tablet TAKE 1 TABLET BY MOUTH 2 TIMES DAILY BEFORE A MEAL 60 tablet 5  . glucose monitoring kit (FREESTYLE) monitoring kit 1 each by Does not apply route as needed for other. Dispense any model  that is covered- dispense testing supplies for Q AC/ HS accuchecks- 1 month supply with one refil. 1 each 1  . metFORMIN (GLUCOPHAGE) 1000 MG tablet TAKE 1 TABLET BY MOUTH 2 TIMES DAILY WITH A MEAL. 60 tablet 3  . pregabalin (LYRICA) 50 MG capsule Take 1 capsule (50 mg total) by mouth 3 (three) times daily. (Patient not taking: Reported on 06/09/2015) 90 capsule 3  . traZODone (DESYREL) 150 MG tablet Take 150 mg by mouth at bedtime.     No facility-administered medications prior to visit.    ROS Review of Systems  Constitutional: Negative for fever and chills.  Eyes: Negative for visual disturbance.  Respiratory: Negative for shortness of breath.   Cardiovascular: Negative for chest pain.  Gastrointestinal: Negative for abdominal pain and blood in stool.  Musculoskeletal: Positive for myalgias, back pain, arthralgias and gait problem.  Skin: Negative for rash.  Allergic/Immunologic: Negative for immunocompromised state.  Neurological: Negative for tremors.  Hematological: Negative for adenopathy. Does not bruise/bleed easily.  Psychiatric/Behavioral: Negative for suicidal ideas and dysphoric mood.    Objective:  BP 132/86 mmHg  Pulse 72  Temp(Src) 98.6 F (37 C)  Resp 16  Ht _0  (1.676 m)  Wt 236 lb (107.049 kg)  BMI 38.11 kg/m2  SpO2 100%  BP/Weight 08/06/2015 06/09/2015  09/60/4540  Systolic BP 981 191 478  Diastolic BP 86 83 89  Wt. (Lbs) 236 235 -  BMI 38.11 37.37 -   Physical Exam  Constitutional: She is oriented to person, place, and time. She appears well-developed and well-nourished. No distress.  HENT:  Head: Normocephalic and atraumatic.  Cardiovascular: Normal rate, regular rhythm, normal heart sounds and intact distal pulses.   Pulses:      Radial pulses are 2+ on the right side, and 2+ on the left side.  Pulmonary/Chest: Effort normal and breath sounds normal.  Musculoskeletal: She exhibits no edema.  Neurological: She is alert and oriented to person,  place, and time.  Skin: Skin is warm and dry. No rash noted. No erythema. No pallor.  Psychiatric: She has a normal mood and affect.   Lab Results  Component Value Date   HGBA1C 5.90 04/10/2015   CBG 74  Depression screen [facility] 2/9 08/06/2015 04/10/2015 02/18/2014  Decreased Interest 0 0 0  Down, Depressed, Hopeless 0 0 0  PHQ - 2 Score 0 0 0  Altered sleeping 1 - -  Tired, decreased energy 3 - -  Change in appetite 0 - -  Feeling bad or failure about yourself  0 - -  Trouble concentrating 0 - -  Moving slowly or fidgety/restless 0 - -  Suicidal thoughts 0 - -  PHQ-9 Score 4 - -   GAD 7 : Generalized Anxiety Score 08/06/2015  Nervous, Anxious, on Edge 0  Control/stop worrying 1  Worry too much - different things 1  Trouble relaxing 0  Restless 0  Easily annoyed or irritable 1  Afraid - awful might happen 0  Total GAD 7 Score 3    Assessment & Plan:   Problem List Items Addressed This Visit    Tremor of both hands - Primary   Diabetic peripheral neuropathy associated with type 2 diabetes mellitus (HCC)   Relevant Medications   gabapentin (NEURONTIN) 300 MG capsule   buPROPion (WELLBUTRIN XL) 150 MG 24 hr tablet   DULoxetine (CYMBALTA) 30 MG capsule   Diabetes type 2, controlled (HCC) (Chronic)   Relevant Orders   HgB A1c (Completed)   Glucose (CBG) (Completed)   Depression (Chronic)   Relevant Medications   buPROPion (WELLBUTRIN XL) 150 MG 24 hr tablet   DULoxetine (CYMBALTA) 30 MG capsule    Other Visit Diagnoses    Encounter for screening mammogram for breast cancer        Need for hepatitis C screening test        Relevant Orders    Hepatitis C antibody, reflex    Screen for STD (sexually transmitted disease)        Relevant Orders    HIV antibody (with reflex)    Urine cytology ancillary only    RPR    Visit for screening mammogram        Relevant Orders    MM DIGITAL SCREENING BILATERAL    Visit for screening mammogram   (Acute)      Relevant Orders     MM DIGITAL SCREENING BILATERAL       No orders of the defined types were placed in this encounter.    Follow-up: No Follow-up on file.   Boykin Nearing MD

## 2015-08-06 NOTE — Patient Instructions (Addendum)
Briana Alvarez was seen today for tremors.  Diagnoses and all orders for this visit:  Tremor of both hands  Encounter for screening mammogram for breast cancer  Controlled type 2 diabetes mellitus without complication, without long-term current use of insulin (HCC) -     HgB A1c -     Glucose (CBG)  Diabetic peripheral neuropathy associated with type 2 diabetes mellitus (HCC) -     gabapentin (NEURONTIN) 300 MG capsule; Take 3 capsules (900 mg total) by mouth 3 (three) times daily.  Depression -     Discontinue: DULoxetine (CYMBALTA) 60 MG capsule; Take 1 capsule (60 mg total) by mouth daily. -     buPROPion (WELLBUTRIN XL) 150 MG 24 hr tablet; Take 1 tablet (150 mg total) by mouth daily. -     DULoxetine (CYMBALTA) 30 MG capsule; Take 1 capsule (30 mg total) by mouth daily.  Need for hepatitis C screening test -     Hepatitis C antibody, reflex  Screen for STD (sexually transmitted disease) -     HIV antibody (with reflex) -     Urine cytology ancillary only -     RPR  Visit for screening mammogram -     MM DIGITAL SCREENING BILATERAL; Future  Visit for screening mammogram -     MM DIGITAL SCREENING BILATERAL; Future   Here is the contact info of the pain management center you were referred to: [facility] [address], [place], [place] [address]  [phone]  Delayed orgasm on Cymbalta 60 mg and trazodone. Decrease Cymbalta to 30 mg daily daily Add Wellbutrin 150 mg XR daily, stop Wellbutrin if you notice worsening anxiety symptoms   F/u in 3 weeks for depression/medication adjustments   Dr. Adrian Blackwater

## 2015-08-07 LAB — HIV ANTIBODY (ROUTINE TESTING W REFLEX): HIV 1&2 Ab, 4th Generation: NONREACTIVE

## 2015-08-07 LAB — URINE CYTOLOGY ANCILLARY ONLY
CHLAMYDIA, DNA PROBE: NEGATIVE
Neisseria Gonorrhea: NEGATIVE
Trichomonas: NEGATIVE

## 2015-08-07 LAB — HEPATITIS C ANTIBODY: HCV Ab: NEGATIVE

## 2015-08-07 LAB — RPR

## 2015-08-12 NOTE — Telephone Encounter (Signed)
Pt. Called requesting her lab results. Please f/u °

## 2015-08-19 ENCOUNTER — Ambulatory Visit
Admission: RE | Admit: 2015-08-19 | Discharge: 2015-08-19 | Disposition: A | Discharge status: Home / Self Care | Payer: No Typology Code available for payment source | Source: Ambulatory Visit | Attending: Family Medicine | Admitting: Family Medicine

## 2015-08-19 DIAGNOSIS — Z1231 Encounter for screening mammogram for malignant neoplasm of breast: Secondary | ICD-10-CM

## 2015-08-19 NOTE — Telephone Encounter (Signed)
Pt. Returned Call. Please f/u with pt.

## 2015-08-19 NOTE — Telephone Encounter (Signed)
LVM to return call    Notes Recorded by Boykin Nearing, MD on 08/07/2015 at 4:29 PM Negative screening HC/chlam/trich Notes Recorded by Boykin Nearing, MD on 08/07/2015 at 8:41 AM Negative screening HIV and RPR (syphilis) Hep C negative

## 2015-08-20 ENCOUNTER — Telehealth: Payer: Self-pay | Admitting: Family Medicine

## 2015-08-20 NOTE — Telephone Encounter (Signed)
Pt called requesting to speak to nurse, states nurse called patient. Please f/u

## 2015-08-21 NOTE — Telephone Encounter (Signed)
Patient given all lab results from last month OV.  All labs negative She voiced understanding.

## 2015-08-21 NOTE — Telephone Encounter (Signed)
Pt. Returned call. Pt. Is very upset and stated she has been waiting for one week for the nurse to call back. Pt. Would like for nurse to return call today. Please f/u

## 2015-08-27 ENCOUNTER — Encounter: Payer: Self-pay | Admitting: Family Medicine

## 2015-08-27 ENCOUNTER — Ambulatory Visit: Payer: Medicaid Other | Attending: Family Medicine | Admitting: Family Medicine

## 2015-08-27 DIAGNOSIS — F32A Depression, unspecified: Secondary | ICD-10-CM

## 2015-08-27 DIAGNOSIS — M5416 Radiculopathy, lumbar region: Secondary | ICD-10-CM | POA: Diagnosis not present

## 2015-08-27 DIAGNOSIS — F329 Major depressive disorder, single episode, unspecified: Secondary | ICD-10-CM | POA: Diagnosis not present

## 2015-08-27 DIAGNOSIS — M5442 Lumbago with sciatica, left side: Secondary | ICD-10-CM

## 2015-08-27 DIAGNOSIS — M545 Low back pain, unspecified: Secondary | ICD-10-CM | POA: Insufficient documentation

## 2015-08-27 DIAGNOSIS — E119 Type 2 diabetes mellitus without complications: Secondary | ICD-10-CM | POA: Diagnosis not present

## 2015-08-27 LAB — GLUCOSE, POCT (MANUAL RESULT ENTRY): POC GLUCOSE: 116 mg/dL — AB (ref 70–99)

## 2015-08-27 MED ORDER — METHYLPREDNISOLONE 4 MG PO TBPK: ORAL_TABLET | ORAL | Status: DC

## 2015-08-27 MED ORDER — CYCLOBENZAPRINE HCL 10 MG PO TABS: 10.0000 mg | ORAL_TABLET | Freq: Three times a day (TID) | ORAL | Status: DC | PRN

## 2015-08-27 MED ORDER — INSULIN PEN NEEDLE 31G X 8 MM MISC: 1.0000 "application " | Freq: Two times a day (BID) | Status: DC

## 2015-08-27 MED ORDER — EXENATIDE 5 MCG/0.02ML ~~LOC~~ SOPN: 5.0000 ug | PEN_INJECTOR | Freq: Two times a day (BID) | SUBCUTANEOUS | Status: DC

## 2015-08-27 MED ORDER — DULOXETINE HCL 20 MG PO CPEP
20.0000 mg | ORAL_CAPSULE | Freq: Every day | ORAL | Status: DC
Start: 2015-08-27 — End: 2015-10-19

## 2015-08-27 MED FILL — METHYLPREDNISOLONE 4 MG TAB: 4 | 6 days supply | Qty: 21 | Fill #0

## 2015-08-27 MED FILL — ?CYCLOBENZAPRINE 10 MG TABL: 10 | 10 days supply | Qty: 30 | Fill #0

## 2015-08-27 MED FILL — DULoxetine HCL 20 MG CPEP: 20 | 15 days supply | Qty: 15 | Fill #0

## 2015-08-27 MED FILL — !BYETTA 5 MCG DOSE PEN INJ: 5 | 22 days supply | Qty: 1 | Fill #0

## 2015-08-27 MED FILL — ULTICARE PEN NDL 4MM 32G: 32G X 4 MM | 30 days supply | Qty: 100 | Fill #0

## 2015-08-27 NOTE — Assessment & Plan Note (Signed)
A: left low back pain with radicular symptoms P: Steroid dose pak Flexeril Patient to fill tylenol #3 Plan to PT if no improvement with above  Plan for MRI of no improvement with PT

## 2015-08-27 NOTE — Assessment & Plan Note (Signed)
A; depression is well controlled. Sexual dysfunction has improved with lower dose of cymbalta P: Continue to taper cymbalta

## 2015-08-27 NOTE — Progress Notes (Signed)
Subjective:  Patient ID: Briana Alvarez, female    DOB: February 04, 1961  Age: 55 y.o. MRN: [id number]  CC: Back Pain and Depression   HPI Briana Alvarez presents for    1. L hip pain: x one month, radiating from posterior gluteal area to knee. No recent injury. Has hx of LBP improved with dry needling. Also has diabetic neuropathy. No numbness. No fecal or urinary incontinence. No weakness in leg.   2. Depression: improved. Decreased sex drive has also improved with decreased dose of cymbalta. No SI. Amenable to further tapering of cymbalta.   3. Diabetes: well controlled. Would like to lose weight. Having trouble. Compliant with meformin and glipizide. No low CBGs. Has idabetic neuropathy   Social History  Substance Use Topics  . Smoking status: Never Smoker   . Smokeless tobacco: Never Used  . Alcohol Use: No    Outpatient Prescriptions Prior to Visit  Medication Sig Dispense Refill  . acetaminophen-codeine (TYLENOL #3) 300-30 MG tablet Take 1 tablet by mouth every 8 (eight) hours as needed for moderate pain. 60 tablet 2  . albuterol (PROVENTIL HFA;VENTOLIN HFA) 108 (90 BASE) MCG/ACT inhaler Inhale 2 puffs into the lungs every 6 (six) hours as needed for wheezing or shortness of breath. 1 Inhaler 2  . benazepril (LOTENSIN) 10 MG tablet Take 1 tablet (10 mg total) by mouth daily. 30 tablet 11  . Brexpiprazole (REXULTI) 1 MG TABS Take by mouth.    Marland Kitchen buPROPion (WELLBUTRIN XL) 150 MG 24 hr tablet Take 1 tablet (150 mg total) by mouth daily. 30 tablet 3  . DULoxetine (CYMBALTA) 30 MG capsule Take 1 capsule (30 mg total) by mouth daily. 30 capsule 1  . gabapentin (NEURONTIN) 300 MG capsule Take 3 capsules (900 mg total) by mouth 3 (three) times daily. 90 capsule 3  . glipiZIDE (GLUCOTROL) 10 MG tablet TAKE 1 TABLET BY MOUTH 2 TIMES DAILY BEFORE A MEAL 60 tablet 5  . glucose monitoring kit (FREESTYLE) monitoring kit 1 each by Does not apply route as needed for other. Dispense any model that is  covered- dispense testing supplies for Q AC/ HS accuchecks- 1 month supply with one refil. 1 each 1  . metFORMIN (GLUCOPHAGE) 1000 MG tablet TAKE 1 TABLET BY MOUTH 2 TIMES DAILY WITH A MEAL. 60 tablet 3  . traZODone (DESYREL) 150 MG tablet Take 150 mg by mouth at bedtime.     No facility-administered medications prior to visit.    ROS Review of Systems  Constitutional: Negative for fever and chills.  Eyes: Negative for visual disturbance.  Respiratory: Negative for shortness of breath.   Cardiovascular: Negative for chest pain.  Gastrointestinal: Negative for abdominal pain and blood in stool.  Musculoskeletal: Positive for myalgias, back pain, arthralgias and gait problem.  Skin: Negative for rash.  Allergic/Immunologic: Negative for immunocompromised state.  Neurological: Negative for tremors.  Hematological: Negative for adenopathy. Does not bruise/bleed easily.  Psychiatric/Behavioral: Negative for suicidal ideas and dysphoric mood.    Objective:  BP 130/86 mmHg  Pulse 79  Temp(Src) 98.5 F (36.9 C) (Oral)  Wt 240 lb 9.6 oz (109.135 kg)  BP/Weight 08/27/2015 08/06/2015 15/10/6977  Systolic BP 480 165 537  Diastolic BP 86 86 83  Wt. (Lbs) 240.6 236 235  BMI 38.85 38.11 37.37   Physical Exam  Constitutional: She is oriented to person, place, and time. She appears well-developed and well-nourished. No distress.  HENT:  Head: Normocephalic and atraumatic.  Cardiovascular: Normal rate, regular rhythm, normal  heart sounds and intact distal pulses.   Pulses:      Radial pulses are 2+ on the right side, and 2+ on the left side.  Pulmonary/Chest: Effort normal and breath sounds normal.  Musculoskeletal: She exhibits no edema.       Legs: Back Exam: Back: Normal Curvature, no deformities or CVA tenderness  Paraspinal Tenderness: present on L lumbar   LE Strength 5/5  LE Sensation: in tact  LE Reflexes 2+ and symmetric  Straight leg raise: neg    Neurological: She is alert  and oriented to person, place, and time.  Skin: Skin is warm and dry. No rash noted. No erythema. No pallor.  Psychiatric: She has a normal mood and affect.   Lab Results  Component Value Date   HGBA1C 5.80 08/06/2015   Depression screen [facility] 2/9 08/27/2015 08/06/2015 04/10/2015  Decreased Interest 0 0 0  Down, Depressed, Hopeless 0 0 0  PHQ - 2 Score 0 0 0  Altered sleeping 1 1 -  Tired, decreased energy 1 3 -  Change in appetite 0 0 -  Feeling bad or failure about yourself  0 0 -  Trouble concentrating 0 0 -  Moving slowly or fidgety/restless 0 0 -  Suicidal thoughts 0 0 -  PHQ-9 Score 2 4 -    GAD 7 : Generalized Anxiety Score 08/27/2015 08/06/2015  Nervous, Anxious, on Edge 0 0  Control/stop worrying 0 1  Worry too much - different things 0 1  Trouble relaxing 0 0  Restless 0 0  Easily annoyed or irritable 0 1  Afraid - awful might happen 0 0  Total GAD 7 Score 0 3  Anxiety Difficulty Not difficult at all -    Assessment & Plan:   There are no diagnoses linked to this encounter.  [name] was seen today for back pain and depression.  Diagnoses and all orders for this visit:  Controlled type 2 diabetes mellitus without complication, without long-term current use of insulin (HCC) -     exenatide (BYETTA 5 MCG PEN) 5 MCG/0.02ML SOPN injection; Inject 0.02 mLs (5 mcg total) into the skin 2 (two) times daily with a meal. -     Glucose (CBG) -     Insulin Pen Needle (B-D ULTRAFINE III SHORT PEN) 31G X 8 MM MISC; 1 application by Does not apply route 2 (two) times daily.  Depression -     DULoxetine (CYMBALTA) 20 MG capsule; Take 1 capsule (20 mg total) by mouth daily. Take 20 mg daily for one week, then every other day for one week then STOP  Left-sided low back pain with left-sided sciatica -     methylPREDNISolone (MEDROL DOSEPAK) 4 MG TBPK tablet; Taper per packet insert -     cyclobenzaprine (FLEXERIL) 10 MG tablet; Take 1 tablet (10 mg total) by mouth 3 (three) times  daily as needed for muscle spasms.  Lumbar radiculopathy, chronic   Follow-up: No Follow-up on file.   Boykin Nearing MD

## 2015-08-27 NOTE — Patient Instructions (Addendum)
Briana Alvarez was seen today for back pain and depression.  Diagnoses and all orders for this visit:  Right-sided low back pain with right-sided sciatica -     methylPREDNISolone (MEDROL DOSEPAK) 4 MG TBPK tablet; Taper per packet insert -     cyclobenzaprine (FLEXERIL) 10 MG tablet; Take 1 tablet (10 mg total) by mouth 3 (three) times daily as needed for muscle spasms.  Controlled type 2 diabetes mellitus without complication, without long-term current use of insulin (HCC) -     exenatide (BYETTA 5 MCG PEN) 5 MCG/0.02ML SOPN injection; Inject 0.02 mLs (5 mcg total) into the skin 2 (two) times daily with a meal. -     Glucose (CBG)  Depression -     DULoxetine (CYMBALTA) 20 MG capsule; Take 1 capsule (20 mg total) by mouth daily. Take 20 mg daily for one week, then every other day for one week then STOP   Taper off cymbalta per orders  F/u in 3 weeks for R sided low back pain  Dr. Adrian Blackwater   Low Back Sprain With Rehab A sprain is an injury in which a ligament is torn. The ligaments of the lower back are vulnerable to sprains. However, they are strong and require great force to be injured. These ligaments are important for stabilizing the spinal column. Sprains are classified into three categories. Grade 1 sprains cause pain, but the tendon is not lengthened. Grade 2 sprains include a lengthened ligament, due to the ligament being stretched or partially ruptured. With grade 2 sprains there is still function, although the function may be decreased. Grade 3 sprains involve a complete tear of the tendon or muscle, and function is usually impaired. SYMPTOMS   Severe pain in the lower back.  Sometimes, a feeling of a "pop," "snap," or tear, at the time of injury.  Tenderness and sometimes swelling at the injury site.  Uncommonly, bruising (contusion) within 48 hours of injury.  Muscle spasms in the back. CAUSES  Low back sprains occur when a force is placed on the ligaments that is greater than  they can handle. Common causes of injury include:  Performing a stressful act while off-balance.  Repetitive stressful activities that involve movement of the lower back.  Direct hit (trauma) to the lower back. RISK INCREASES WITH:  Contact sports (football, wrestling).  Collisions (major skiing accidents).  Sports that require throwing or lifting (baseball, weightlifting).  Sports involving twisting of the spine (gymnastics, diving, tennis, golf).  Poor strength and flexibility.  Inadequate protection.  Previous back injury or surgery (especially fusion). PREVENTION  Wear properly fitted and padded protective equipment.  Warm up and stretch properly before activity.  Allow for adequate recovery between workouts.  Maintain physical fitness:  Strength, flexibility, and endurance.  Cardiovascular fitness.  Maintain a healthy body weight. PROGNOSIS  If treated properly, low back sprains usually heal with non-surgical treatment. The length of time for healing depends on the severity of the injury.  RELATED COMPLICATIONS   Recurring symptoms, resulting in a chronic problem.  Chronic inflammation and pain in the low back.  Delayed healing or resolution of symptoms, especially if activity is resumed too soon.  Prolonged impairment.  Unstable or arthritic joints of the low back. TREATMENT  Treatment first involves the use of ice and medicine, to reduce pain and inflammation. The use of strengthening and stretching exercises may help reduce pain with activity. These exercises may be performed at home or with a therapist. Severe injuries may require referral  to a therapist for further evaluation and treatment, such as ultrasound. Your caregiver may advise that you wear a back brace or corset, to help reduce pain and discomfort. Often, prolonged bed rest results in greater harm then benefit. Corticosteroid injections may be recommended. However, these should be reserved for  the most serious cases. It is important to avoid using your back when lifting objects. At night, sleep on your back on a firm mattress, with a pillow placed under your knees. If non-surgical treatment is unsuccessful, surgery may be needed.  MEDICATION   If pain medicine is needed, nonsteroidal anti-inflammatory medicines (aspirin and ibuprofen), or other minor pain relievers (acetaminophen), are often advised.  Do not take pain medicine for 7 days before surgery.  Prescription pain relievers may be given, if your caregiver thinks they are needed. Use only as directed and only as much as you need.  Ointments applied to the skin may be helpful.  Corticosteroid injections may be given by your caregiver. These injections should be reserved for the most serious cases, because they may only be given a certain number of times. HEAT AND COLD  Cold treatment (icing) should be applied for 10 to 15 minutes every 2 to 3 hours for inflammation and pain, and immediately after activity that aggravates your symptoms. Use ice packs or an ice massage.  Heat treatment may be used before performing stretching and strengthening activities prescribed by your caregiver, physical therapist, or athletic trainer. Use a heat pack or a warm water soak. SEEK MEDICAL CARE IF:   Symptoms get worse or do not improve in 2 to 4 weeks, despite treatment.  You develop numbness or weakness in either leg.  You lose bowel or bladder function.  Any of the following occur after surgery: fever, increased pain, swelling, redness, drainage of fluids, or bleeding in the affected area.  New, unexplained symptoms develop. (Drugs used in treatment may produce side effects.) EXERCISES  RANGE OF MOTION (ROM) AND STRETCHING EXERCISES - Low Back Sprain Most people with lower back pain will find that their symptoms get worse with excessive bending forward (flexion) or arching at the lower back (extension). The exercises that will help  resolve your symptoms will focus on the opposite motion.  Your physician, physical therapist or athletic trainer will help you determine which exercises will be most helpful to resolve your lower back pain. Do not complete any exercises without first consulting with your caregiver. Discontinue any exercises which make your symptoms worse, until you speak to your caregiver. If you have pain, numbness or tingling which travels down into your buttocks, leg or foot, the goal of the therapy is for these symptoms to move closer to your back and eventually resolve. Sometimes, these leg symptoms will get better, but your lower back pain may worsen. This is often an indication of progress in your rehabilitation. Be very alert to any changes in your symptoms and the activities in which you participated in the 24 hours prior to the change. Sharing this information with your caregiver will allow him or her to most efficiently treat your condition. These exercises may help you when beginning to rehabilitate your injury. Your symptoms may resolve with or without further involvement from your physician, physical therapist or athletic trainer. While completing these exercises, remember:   Restoring tissue flexibility helps normal motion to return to the joints. This allows healthier, less painful movement and activity.  An effective stretch should be held for at least 30 seconds.  A  stretch should never be painful. You should only feel a gentle lengthening or release in the stretched tissue. FLEXION RANGE OF MOTION AND STRETCHING EXERCISES: STRETCH - Flexion, Single Knee to Chest   Lie on a firm bed or floor with both legs extended in front of you.  Keeping one leg in contact with the floor, bring your opposite knee to your chest. Hold your leg in place by either grabbing behind your thigh or at your knee.  Pull until you feel a gentle stretch in your low back. Hold __________ seconds.  Slowly release your grasp  and repeat the exercise with the opposite side. Repeat __________ times. Complete this exercise __________ times per day.  STRETCH - Flexion, Double Knee to Chest  Lie on a firm bed or floor with both legs extended in front of you.  Keeping one leg in contact with the floor, bring your opposite knee to your chest.  Tense your stomach muscles to support your back and then lift your other knee to your chest. Hold your legs in place by either grabbing behind your thighs or at your knees.  Pull both knees toward your chest until you feel a gentle stretch in your low back. Hold __________ seconds.  Tense your stomach muscles and slowly return one leg at a time to the floor. Repeat __________ times. Complete this exercise __________ times per day.  STRETCH - Low Trunk Rotation  Lie on a firm bed or floor. Keeping your legs in front of you, bend your knees so they are both pointed toward the ceiling and your feet are flat on the floor.  Extend your arms out to the side. This will stabilize your upper body by keeping your shoulders in contact with the floor.  Gently and slowly drop both knees together to one side until you feel a gentle stretch in your low back. Hold for __________ seconds.  Tense your stomach muscles to support your lower back as you bring your knees back to the starting position. Repeat the exercise to the other side. Repeat __________ times. Complete this exercise __________ times per day  EXTENSION RANGE OF MOTION AND FLEXIBILITY EXERCISES: STRETCH - Extension, Prone on Elbows   Lie on your stomach on the floor, a bed will be too soft. Place your palms about shoulder width apart and at the height of your head.  Place your elbows under your shoulders. If this is too painful, stack pillows under your chest.  Allow your body to relax so that your hips drop lower and make contact more completely with the floor.  Hold this position for __________ seconds.  Slowly return to  lying flat on the floor. Repeat __________ times. Complete this exercise __________ times per day.  RANGE OF MOTION - Extension, Prone Press Ups  Lie on your stomach on the floor, a bed will be too soft. Place your palms about shoulder width apart and at the height of your head.  Keeping your back as relaxed as possible, slowly straighten your elbows while keeping your hips on the floor. You may adjust the placement of your hands to maximize your comfort. As you gain motion, your hands will come more underneath your shoulders.  Hold this position __________ seconds.  Slowly return to lying flat on the floor. Repeat __________ times. Complete this exercise __________ times per day.  RANGE OF MOTION- Quadruped, Neutral Spine   Assume a hands and knees position on a firm surface. Keep your hands under your shoulders  and your knees under your hips. You may place padding under your knees for comfort.  Drop your head and point your tailbone toward the ground below you. This will round out your lower back like an angry cat. Hold this position for __________ seconds.  Slowly lift your head and release your tail bone so that your back sags into a large arch, like an old horse.  Hold this position for __________ seconds.  Repeat this until you feel limber in your low back.  Now, find your "sweet spot." This will be the most comfortable position somewhere between the two previous positions. This is your neutral spine. Once you have found this position, tense your stomach muscles to support your low back.  Hold this position for __________ seconds. Repeat __________ times. Complete this exercise __________ times per day.  STRENGTHENING EXERCISES - Low Back Sprain These exercises may help you when beginning to rehabilitate your injury. These exercises should be done near your "sweet spot." This is the neutral, low-back arch, somewhere between fully rounded and fully arched, that is your least painful  position. When performed in this safe range of motion, these exercises can be used for people who have either a flexion or extension based injury. These exercises may resolve your symptoms with or without further involvement from your physician, physical therapist or athletic trainer. While completing these exercises, remember:   Muscles can gain both the endurance and the strength needed for everyday activities through controlled exercises.  Complete these exercises as instructed by your physician, physical therapist or athletic trainer. Increase the resistance and repetitions only as guided.  You may experience muscle soreness or fatigue, but the pain or discomfort you are trying to eliminate should never worsen during these exercises. If this pain does worsen, stop and make certain you are following the directions exactly. If the pain is still present after adjustments, discontinue the exercise until you can discuss the trouble with your caregiver. STRENGTHENING - Deep Abdominals, Pelvic Tilt   Lie on a firm bed or floor. Keeping your legs in front of you, bend your knees so they are both pointed toward the ceiling and your feet are flat on the floor.  Tense your lower abdominal muscles to press your low back into the floor. This motion will rotate your pelvis so that your tail bone is scooping upwards rather than pointing at your feet or into the floor. With a gentle tension and even breathing, hold this position for __________ seconds. Repeat __________ times. Complete this exercise __________ times per day.  STRENGTHENING - Abdominals, Crunches   Lie on a firm bed or floor. Keeping your legs in front of you, bend your knees so they are both pointed toward the ceiling and your feet are flat on the floor. Cross your arms over your chest.  Slightly tip your chin down without bending your neck.  Tense your abdominals and slowly lift your trunk high enough to just clear your shoulder blades.  Lifting higher can put excessive stress on the lower back and does not further strengthen your abdominal muscles.  Control your return to the starting position. Repeat __________ times. Complete this exercise __________ times per day.  STRENGTHENING - Quadruped, Opposite UE/LE Lift   Assume a hands and knees position on a firm surface. Keep your hands under your shoulders and your knees under your hips. You may place padding under your knees for comfort.  Find your neutral spine and gently tense your abdominal muscles so  that you can maintain this position. Your shoulders and hips should form a rectangle that is parallel with the floor and is not twisted.  Keeping your trunk steady, lift your right hand no higher than your shoulder and then your left leg no higher than your hip. Make sure you are not holding your breath. Hold this position for __________ seconds.  Continuing to keep your abdominal muscles tense and your back steady, slowly return to your starting position. Repeat with the opposite arm and leg. Repeat __________ times. Complete this exercise __________ times per day.  STRENGTHENING - Abdominals and Quadriceps, Straight Leg Raise   Lie on a firm bed or floor with both legs extended in front of you.  Keeping one leg in contact with the floor, bend the other knee so that your foot can rest flat on the floor.  Find your neutral spine, and tense your abdominal muscles to maintain your spinal position throughout the exercise.  Slowly lift your straight leg off the floor about 6 inches for a count of 15, making sure to not hold your breath.  Still keeping your neutral spine, slowly lower your leg all the way to the floor. Repeat this exercise with each leg __________ times. Complete this exercise __________ times per day. POSTURE AND BODY MECHANICS CONSIDERATIONS - Low Back Sprain Keeping correct posture when sitting, standing or completing your activities will reduce the stress  put on different body tissues, allowing injured tissues a chance to heal and limiting painful experiences. The following are general guidelines for improved posture. Your physician or physical therapist will provide you with any instructions specific to your needs. While reading these guidelines, remember:  The exercises prescribed by your provider will help you have the flexibility and strength to maintain correct postures.  The correct posture provides the best environment for your joints to work. All of your joints have less wear and tear when properly supported by a spine with good posture. This means you will experience a healthier, less painful body.  Correct posture must be practiced with all of your activities, especially prolonged sitting and standing. Correct posture is as important when doing repetitive low-stress activities (typing) as it is when doing a single heavy-load activity (lifting). RESTING POSITIONS Consider which positions are most painful for you when choosing a resting position. If you have pain with flexion-based activities (sitting, bending, stooping, squatting), choose a position that allows you to rest in a less flexed posture. You would want to avoid curling into a fetal position on your side. If your pain worsens with extension-based activities (prolonged standing, working overhead), avoid resting in an extended position such as sleeping on your stomach. Most people will find more comfort when they rest with their spine in a more neutral position, neither too rounded nor too arched. Lying on a non-sagging bed on your side with a pillow between your knees, or on your back with a pillow under your knees will often provide some relief. Keep in mind, being in any one position for a prolonged period of time, no matter how correct your posture, can still lead to stiffness. PROPER SITTING POSTURE In order to minimize stress and discomfort on your spine, you must sit with correct  posture. Sitting with good posture should be effortless for a healthy body. Returning to good posture is a gradual process. Many people can work toward this most comfortably by using various supports until they have the flexibility and strength to maintain this posture on their  own. When sitting with proper posture, your ears will fall over your shoulders and your shoulders will fall over your hips. You should use the back of the chair to support your upper back. Your lower back will be in a neutral position, just slightly arched. You may place a small pillow or folded towel at the base of your lower back for  support.  When working at a desk, create an environment that supports good, upright posture. Without extra support, muscles tire, which leads to excessive strain on joints and other tissues. Keep these recommendations in mind: CHAIR:  A chair should be able to slide under your desk when your back makes contact with the back of the chair. This allows you to work closely.  The chair's height should allow your eyes to be level with the upper part of your monitor and your hands to be slightly lower than your elbows. BODY POSITION  Your feet should make contact with the floor. If this is not possible, use a foot rest.  Keep your ears over your shoulders. This will reduce stress on your neck and low back. INCORRECT SITTING POSTURES  If you are feeling tired and unable to assume a healthy sitting posture, do not slouch or slump. This puts excessive strain on your back tissues, causing more damage and pain. Healthier options include:  Using more support, like a lumbar pillow.  Switching tasks to something that requires you to be upright or walking.  Talking a brief walk.  Lying down to rest in a neutral-spine position. PROLONGED STANDING WHILE SLIGHTLY LEANING FORWARD  When completing a task that requires you to lean forward while standing in one place for a long time, place either foot up on  a stationary 2-4 inch high object to help maintain the best posture. When both feet are on the ground, the lower back tends to lose its slight inward curve. If this curve flattens (or becomes too large), then the back and your other joints will experience too much stress, tire more quickly, and can cause pain. CORRECT STANDING POSTURES Proper standing posture should be assumed with all daily activities, even if they only take a few moments, like when brushing your teeth. As in sitting, your ears should fall over your shoulders and your shoulders should fall over your hips. You should keep a slight tension in your abdominal muscles to brace your spine. Your tailbone should point down to the ground, not behind your body, resulting in an over-extended swayback posture.  INCORRECT STANDING POSTURES  Common incorrect standing postures include a forward head, locked knees and/or an excessive swayback. WALKING Walk with an upright posture. Your ears, shoulders and hips should all line-up. PROLONGED ACTIVITY IN A FLEXED POSITION When completing a task that requires you to bend forward at your waist or lean over a low surface, try to find a way to stabilize 3 out of 4 of your limbs. You can place a hand or elbow on your thigh or rest a knee on the surface you are reaching across. This will provide you more stability, so that your muscles do not tire as quickly. By keeping your knees relaxed, or slightly bent, you will also reduce stress across your lower back. CORRECT LIFTING TECHNIQUES DO :  Assume a wide stance. This will provide you more stability and the opportunity to get as close as possible to the object which you are lifting.  Tense your abdominals to brace your spine. Bend at the knees and  hips. Keeping your back locked in a neutral-spine position, lift using your leg muscles. Lift with your legs, keeping your back straight.  Test the weight of unknown objects before attempting to lift them.  Try  to keep your elbows locked down at your sides in order get the best strength from your shoulders when carrying an object.  Always ask for help when lifting heavy or awkward objects. INCORRECT LIFTING TECHNIQUES DO NOT:   Lock your knees when lifting, even if it is a small object.  Bend and twist. Pivot at your feet or move your feet when needing to change directions.  Assume that you can safely pick up even a paperclip without proper posture.   This information is not intended to replace advice given to you by your health care provider. Make sure you discuss any questions you have with your health care provider.   Document Released: 06/06/2005 Document Revised: 06/27/2014 Document Reviewed: 09/18/2008 Elsevier Interactive Patient Education [employer].

## 2015-08-27 NOTE — Assessment & Plan Note (Signed)
A: diabetes well controlled. Patient desires weight loss P: D/c glipizide Add byetta Continue metformin

## 2015-08-28 ENCOUNTER — Encounter: Payer: Self-pay | Admitting: Clinical

## 2015-08-28 NOTE — Progress Notes (Signed)
Depression screen [facility] 2/9 08/27/2015 08/06/2015 04/10/2015 02/18/2014  Decreased Interest 0 0 0 0  Down, Depressed, Hopeless 0 0 0 0  PHQ - 2 Score 0 0 0 0  Altered sleeping 1 1 - -  Tired, decreased energy 1 3 - -  Change in appetite 0 0 - -  Feeling bad or failure about yourself  0 0 - -  Trouble concentrating 0 0 - -  Moving slowly or fidgety/restless 0 0 - -  Suicidal thoughts 0 0 - -  PHQ-9 Score 2 4 - -   GAD 7 : Generalized Anxiety Score 08/27/2015 08/06/2015  Nervous, Anxious, on Edge 0 0  Control/stop worrying 0 1  Worry too much - different things 0 1  Trouble relaxing 0 0  Restless 0 0  Easily annoyed or irritable 0 1  Afraid - awful might happen 0 0  Total GAD 7 Score 0 3  Anxiety Difficulty Not difficult at all -

## 2015-08-31 ENCOUNTER — Encounter (HOSPITAL_COMMUNITY): Payer: Self-pay | Admitting: Family Medicine

## 2015-08-31 ENCOUNTER — Emergency Department (HOSPITAL_COMMUNITY)
Admission: EM | Admit: 2015-08-31 | Discharge: 2015-08-31 | Disposition: A | Discharge status: Home / Self Care | Payer: Medicaid Other | Attending: Emergency Medicine | Admitting: Emergency Medicine

## 2015-08-31 DIAGNOSIS — M542 Cervicalgia: Secondary | ICD-10-CM | POA: Diagnosis not present

## 2015-08-31 DIAGNOSIS — R51 Headache: Secondary | ICD-10-CM | POA: Insufficient documentation

## 2015-08-31 DIAGNOSIS — M62838 Other muscle spasm: Secondary | ICD-10-CM | POA: Diagnosis not present

## 2015-08-31 DIAGNOSIS — F419 Anxiety disorder, unspecified: Secondary | ICD-10-CM | POA: Diagnosis not present

## 2015-08-31 DIAGNOSIS — Z9889 Other specified postprocedural states: Secondary | ICD-10-CM | POA: Diagnosis not present

## 2015-08-31 DIAGNOSIS — I10 Essential (primary) hypertension: Secondary | ICD-10-CM | POA: Insufficient documentation

## 2015-08-31 DIAGNOSIS — E119 Type 2 diabetes mellitus without complications: Secondary | ICD-10-CM | POA: Diagnosis not present

## 2015-08-31 DIAGNOSIS — M1990 Unspecified osteoarthritis, unspecified site: Secondary | ICD-10-CM | POA: Diagnosis not present

## 2015-08-31 DIAGNOSIS — F329 Major depressive disorder, single episode, unspecified: Secondary | ICD-10-CM | POA: Diagnosis not present

## 2015-08-31 DIAGNOSIS — Z794 Long term (current) use of insulin: Secondary | ICD-10-CM | POA: Insufficient documentation

## 2015-08-31 DIAGNOSIS — G629 Polyneuropathy, unspecified: Secondary | ICD-10-CM | POA: Diagnosis not present

## 2015-08-31 DIAGNOSIS — Z8701 Personal history of pneumonia (recurrent): Secondary | ICD-10-CM | POA: Insufficient documentation

## 2015-08-31 DIAGNOSIS — Z7984 Long term (current) use of oral hypoglycemic drugs: Secondary | ICD-10-CM | POA: Insufficient documentation

## 2015-08-31 DIAGNOSIS — Z79899 Other long term (current) drug therapy: Secondary | ICD-10-CM | POA: Insufficient documentation

## 2015-08-31 MED ORDER — ACETAMINOPHEN 325 MG PO TABS
650.0000 mg | ORAL_TABLET | Freq: Once | ORAL | Status: AC
  Administered 2015-08-31: 650 mg via ORAL
  Filled 2015-08-31: qty 2

## 2015-08-31 MED ORDER — KETOROLAC TROMETHAMINE 30 MG/ML IJ SOLN
30.0000 mg | Freq: Once | INTRAMUSCULAR | Status: AC
  Administered 2015-08-31: 30 mg via INTRAMUSCULAR
  Filled 2015-08-31: qty 1

## 2015-08-31 MED ORDER — DIAZEPAM 5 MG PO TABS: 5.0000 mg | ORAL_TABLET | Freq: Three times a day (TID) | ORAL | Status: DC | PRN

## 2015-08-31 MED ORDER — PREDNISONE 20 MG PO TABS: 40.0000 mg | ORAL_TABLET | Freq: Once | ORAL | Status: DC

## 2015-08-31 NOTE — ED Notes (Signed)
Pt here for neck pain radiating into head. sts x 3 days with hx of neck surgery. Denies fever or vision problems. Pt sts pain more on left. denies injury.

## 2015-08-31 NOTE — ED Provider Notes (Signed)
Patient hand off to me by Jen Mow ,PA-C. The patient resented with a complaint of neck pain, history of neck surgery in 2013. On physical exam and by history of present illness is thought that the symptoms are muscular. The plan is for patient to obtain some pain control and reevaluation. They discuss imaging, the patient agrees that his pain medication does not help her symptoms then a CT scan will be done.  On reevaluation at 845 the patient states that her pain is much better she feels that she go home. She has MRI of her neck scheduled for April. I will give her a prescription for Valium for at home as this is the plan that [place] and the patient discussed.   Patient well appearing at discharge. Return precautions discussed.  Filed Vitals:   08/31/15 1930 08/31/15 2015  BP: 155/93 158/95  Pulse: 62 60  Temp:    Resp: 16 [address] PA-C 08/31/15 2049  Tanna Furry, MD 09/12/15 2259

## 2015-08-31 NOTE — Discharge Instructions (Signed)

## 2015-08-31 NOTE — ED Provider Notes (Signed)
CSN: [id number]     Arrival date & time 08/31/15  80 History   First MD Initiated Contact with Patient 08/31/15 1846     Chief Complaint  Patient presents with  . Neck Pain  . Headache  . Nausea    HPI  Ms. Wickersham is an 55 y.o. female with history of DM, HTN, depression, neuropathy, s/p neck surgery who presents to the ED for evaluation of neck pain. She states for the past week her neck has been hurting more and more. She states when she looks down the pain feels like it radiates into her head and the back of her scalp. She denies injury or trauma. Denies headache when neck is not moving. Denies dizziness, lightheadedness, fever, chills. Denies new numbness or weakness. Denies radiation of pain into her arms. She states she saw PCP last week for low back pain and was diagnosed with sciatica and has been taking flexeril but it has not helped her neck pain at all. Pt states she has ortho appt with Dr. Marlou Sa scheduled for early April.   Past Medical History  Diagnosis Date  . Diabetes mellitus   . Hypertension   . Depression   . Anxiety   . Pneumonia   . Headache     "2 a month"  . Neuropathy ([place])   . Arthritis    Past Surgical History  Procedure Laterality Date  . Abdominal hysterectomy    . Carpal tunnel release    . Ulner nerve     . Tonsillectomy    . Neck surgery    . Knee surgery Left   . Knee arthroscopy Right    Family History  Problem Relation Age of Onset  . Cancer Mother   . Diabetes Daughter   . Diabetes Maternal Grandmother   . Heart attack Neg Hx   . Hypertension Mother   . Hypertension Maternal Grandmother   . Stroke Neg Hx    Social History  Substance Use Topics  . Smoking status: Never Smoker   . Smokeless tobacco: Never Used  . Alcohol Use: No   OB History    No data available     Review of Systems  All other systems reviewed and are negative.     Allergies  Shrimp  Home Medications   Prior to Admission medications   Medication Sig  Start Date End Date Taking? Authorizing Provider  acetaminophen-codeine (TYLENOL #3) 300-30 MG tablet Take 1 tablet by mouth every 8 (eight) hours as needed for moderate pain. Patient not taking: Reported on 08/27/2015 04/10/15   Boykin Nearing, MD  albuterol (PROVENTIL HFA;VENTOLIN HFA) 108 (90 BASE) MCG/ACT inhaler Inhale 2 puffs into the lungs every 6 (six) hours as needed for wheezing or shortness of breath. 05/09/14   Gregor Hams, MD  benazepril (LOTENSIN) 10 MG tablet Take 1 tablet (10 mg total) by mouth daily. 12/16/14   Tresa Garter, MD  Brexpiprazole (REXULTI) 1 MG TABS Take by mouth.    Historical Provider, MD  buPROPion (WELLBUTRIN XL) 150 MG 24 hr tablet Take 1 tablet (150 mg total) by mouth daily. 08/06/15   Josalyn Funches, MD  cyclobenzaprine (FLEXERIL) 10 MG tablet Take 1 tablet (10 mg total) by mouth 3 (three) times daily as needed for muscle spasms. 08/27/15   Josalyn Funches, MD  DULoxetine (CYMBALTA) 20 MG capsule Take 1 capsule (20 mg total) by mouth daily. Take 20 mg daily for one week, then every other day for one week  then STOP 08/27/15   Josalyn Funches, MD  exenatide (BYETTA 5 MCG PEN) 5 MCG/0.02ML SOPN injection Inject 0.02 mLs (5 mcg total) into the skin 2 (two) times daily with a meal. 08/27/15   Josalyn Funches, MD  gabapentin (NEURONTIN) 300 MG capsule Take 3 capsules (900 mg total) by mouth 3 (three) times daily. 08/06/15   Josalyn Funches, MD  glucose monitoring kit (FREESTYLE) monitoring kit 1 each by Does not apply route as needed for other. Dispense any model that is covered- dispense testing supplies for Q AC/ HS accuchecks- 1 month supply with one refil. 01/06/15   Lorayne Marek, MD  Insulin Pen Needle (B-D ULTRAFINE III SHORT PEN) 31G X 8 MM MISC 1 application by Does not apply route 2 (two) times daily. 08/27/15   Josalyn Funches, MD  metFORMIN (GLUCOPHAGE) 1000 MG tablet TAKE 1 TABLET BY MOUTH 2 TIMES DAILY WITH A MEAL. 04/14/15   Josalyn Funches, MD   methylPREDNISolone (MEDROL DOSEPAK) 4 MG TBPK tablet Taper per packet insert 08/27/15   Boykin Nearing, MD  Multiple Vitamins-Minerals (WOMENS 50+ MULTI VITAMIN/MIN PO) Take 1 tablet by mouth daily.    Historical Provider, MD  traZODone (DESYREL) 150 MG tablet Take 150 mg by mouth at bedtime.    Historical Provider, MD   BP 153/95 mmHg  Pulse 81  Temp(Src) 98.3 F (36.8 C)  Resp 18  SpO2 97% Physical Exam  Constitutional: She is oriented to person, place, and time. No distress.  HENT:  Head: Atraumatic.  Right Ear: External ear normal.  Left Ear: External ear normal.  Nose: Nose normal.  Eyes: Conjunctivae and EOM are normal. Pupils are equal, round, and reactive to light. No scleral icterus.  No nystagmus  Neck: Normal range of motion. Neck supple.  Posterior neck is diffusely ttp with spasm. FROM of neck. Pt does endorse pain into her occiput with neck flexion. No meningismus.   Cardiovascular: Normal rate and regular rhythm.   Pulmonary/Chest: Effort normal. No respiratory distress. She exhibits no tenderness.  Abdominal: Soft. She exhibits no distension. There is no tenderness.  Neurological: She is alert and oriented to person, place, and time.  UE strength and sensation intact. Normal finger to nose. No pronator drift.   Skin: Skin is warm and dry. She is not diaphoretic.  Psychiatric: She has a normal mood and affect. Her behavior is normal.  Nursing note and vitals reviewed.  Filed Vitals:   08/31/15 1915 08/31/15 1930 08/31/15 2015 08/31/15 2045  BP: 151/90 155/93 158/95 155/85  Pulse: 61 62 60 57  Temp:      Resp:  _0 SpO2: 98% 98% 97% 98%     ED Course  Procedures (including critical care time) Labs Review Labs Reviewed - No data to display  Imaging Review No results found. I have personally reviewed and evaluated these images and lab results as part of my medical decision-making.   EKG Interpretation None      MDM   Final diagnoses:  Neck  pain    Discussed with pt that at this time I suspect soft tissue inflammation/spasm. Given no neuro deficits, no history of injury/trauma, I discussed with pt that CT not indicated. Emergent MRI is not warranted at this time either. X-ray would not be useful. Pt does appear anxious and concerned that there is an issue with her previous surgery. She also notes that she has had bulging discs on prior MRIs. I discussed trial of toradol and tylenol and  can reassess. I had originally intended on starting prednisone as well but EMR review reveals that pt is on medrol dosepack. If pain persists after toradol and tylenol i discussed with pt we can obtain CT c-spine without contrast as she is quite concerned. If improvement can d/c home with NSAIDs, valium. Will sign out to oncoming team.     Anne Ng, PA-C 09/01/15 [place], MD 09/12/15 2259

## 2015-09-01 ENCOUNTER — Other Ambulatory Visit: Payer: Self-pay | Admitting: Family Medicine

## 2015-09-01 MED FILL — GABAPENTIN 300 MG CAPSULE: 300 | 10 days supply | Qty: 90 | Fill #1

## 2015-09-01 MED FILL — BENAZEPRIL HCL 10 MG TABLET: 10 | 30 days supply | Qty: 30 | Fill #0

## 2015-09-01 NOTE — Telephone Encounter (Signed)
Patient needs needles for exenatide (BYETTA 5 MCG PEN) 5 MCG/0.02ML SOPN injection [id number]  Medication refill for metformin and benazepril  Please follow up with patient

## 2015-09-02 ENCOUNTER — Emergency Department (INDEPENDENT_AMBULATORY_CARE_PROVIDER_SITE_OTHER): Payer: No Typology Code available for payment source

## 2015-09-02 ENCOUNTER — Emergency Department (INDEPENDENT_AMBULATORY_CARE_PROVIDER_SITE_OTHER)
Admission: EM | Admit: 2015-09-02 | Discharge: 2015-09-02 | Disposition: A | Discharge status: Home / Self Care | Payer: No Typology Code available for payment source | Source: Home / Self Care | Attending: Family Medicine | Admitting: Family Medicine

## 2015-09-02 ENCOUNTER — Encounter (HOSPITAL_COMMUNITY): Payer: Self-pay

## 2015-09-02 DIAGNOSIS — M545 Low back pain: Secondary | ICD-10-CM

## 2015-09-02 MED ORDER — TRAMADOL HCL 50 MG PO TABS: 50.0000 mg | ORAL_TABLET | Freq: Four times a day (QID) | ORAL | Status: DC | PRN

## 2015-09-02 NOTE — Discharge Instructions (Signed)

## 2015-09-02 NOTE — ED Notes (Signed)
54 y.o./female present with lower back pain on the ride since last night 03/14/217 and she states she is only urinating once a day with no odor or pressure when she urinates. Pt has been taking Cyclobenzaprine 10 mg for pain. No acute distress

## 2015-09-02 NOTE — ED Provider Notes (Signed)
CSN: [id number]     Arrival date & time 09/02/15  1613 History   First MD Initiated Contact with Patient 09/02/15 1801     Chief Complaint  Patient presents with  . Back Pain   (Consider location/radiation/quality/duration/timing/severity/associated sxs/prior Treatment) HPI Recent onset of right lumbar pain, currently being treated for left side sciatica. Similar pain as before. No known trauma. No bowel or bladder dysfunction. Past Medical History  Diagnosis Date  . Diabetes mellitus   . Hypertension   . Depression   . Anxiety   . Pneumonia   . Headache     "2 a month"  . Neuropathy ([place])   . Arthritis    Past Surgical History  Procedure Laterality Date  . Abdominal hysterectomy    . Carpal tunnel release    . Ulner nerve     . Tonsillectomy    . Neck surgery    . Knee surgery Left   . Knee arthroscopy Right    Family History  Problem Relation Age of Onset  . Cancer Mother   . Diabetes Daughter   . Diabetes Maternal Grandmother   . Heart attack Neg Hx   . Hypertension Mother   . Hypertension Maternal Grandmother   . Stroke Neg Hx    Social History  Substance Use Topics  . Smoking status: Never Smoker   . Smokeless tobacco: Never Used  . Alcohol Use: No   OB History    No data available     Review of Systems Back pain Allergies  Shrimp  Home Medications   Prior to Admission medications   Medication Sig Start Date End Date Taking? Authorizing Provider  benazepril (LOTENSIN) 10 MG tablet Take 1 tablet (10 mg total) by mouth daily. 12/16/14  Yes Olugbemiga Essie Christine, MD  Brexpiprazole (REXULTI) 1 MG TABS Take 1 tablet by mouth at bedtime.    Yes Historical Provider, MD  buPROPion (WELLBUTRIN XL) 150 MG 24 hr tablet Take 1 tablet (150 mg total) by mouth daily. 08/06/15  Yes Josalyn Funches, MD  cyclobenzaprine (FLEXERIL) 10 MG tablet Take 1 tablet (10 mg total) by mouth 3 (three) times daily as needed for muscle spasms. 08/27/15  Yes Josalyn Funches, MD   exenatide (BYETTA 5 MCG PEN) 5 MCG/0.02ML SOPN injection Inject 0.02 mLs (5 mcg total) into the skin 2 (two) times daily with a meal. 08/27/15  Yes Josalyn Funches, MD  gabapentin (NEURONTIN) 300 MG capsule Take 3 capsules (900 mg total) by mouth 3 (three) times daily. 08/06/15  Yes Josalyn Funches, MD  metFORMIN (GLUCOPHAGE) 1000 MG tablet TAKE 1 TABLET BY MOUTH 2 TIMES DAILY WITH A MEAL. 09/01/15  Yes Boykin Nearing, MD  Multiple Vitamins-Minerals (WOMENS 50+ MULTI VITAMIN/MIN PO) Take 1 tablet by mouth daily.   Yes Historical Provider, MD  traZODone (DESYREL) 150 MG tablet Take 150 mg by mouth at bedtime.   Yes Historical Provider, MD  acetaminophen-codeine (TYLENOL #3) 300-30 MG tablet Take 1 tablet by mouth every 8 (eight) hours as needed for moderate pain. Patient not taking: Reported on 08/27/2015 04/10/15   Boykin Nearing, MD  albuterol (PROVENTIL HFA;VENTOLIN HFA) 108 (90 BASE) MCG/ACT inhaler Inhale 2 puffs into the lungs every 6 (six) hours as needed for wheezing or shortness of breath. 05/09/14   Gregor Hams, MD  diazepam (VALIUM) 5 MG tablet Take 1 tablet (5 mg total) by mouth every 8 (eight) hours as needed for anxiety. 08/31/15   Delos Haring, PA-C  DULoxetine (CYMBALTA) 20  MG capsule Take 1 capsule (20 mg total) by mouth daily. Take 20 mg daily for one week, then every other day for one week then STOP Patient not taking: Reported on 08/31/2015 08/27/15   Boykin Nearing, MD  glucose monitoring kit (FREESTYLE) monitoring kit 1 each by Does not apply route as needed for other. Dispense any model that is covered- dispense testing supplies for Q AC/ HS accuchecks- 1 month supply with one refil. 01/06/15   Lorayne Marek, MD  Insulin Pen Needle (B-D ULTRAFINE III SHORT PEN) 31G X 8 MM MISC 1 application by Does not apply route 2 (two) times daily. 08/27/15   Josalyn Funches, MD  methylPREDNISolone (MEDROL DOSEPAK) 4 MG TBPK tablet Taper per packet insert Patient taking differently: Take 4 mg by  mouth See admin instructions. Taper per packet insert. Patient states she has two more doses left 08/27/15   Boykin Nearing, MD  traMADol (ULTRAM) 50 MG tablet Take 1 tablet (50 mg total) by mouth every 6 (six) hours as needed. 09/02/15   Konrad Felix, PA   Meds Ordered and Administered this Visit  Medications - No data to display  BP 90/65 mmHg  Pulse 75  Temp(Src) 98.3 F (36.8 C) (Oral)  SpO2 98% No data found.   Physical Exam NURSES NOTES AND VITAL SIGNS REVIEWED. CONSTITUTIONAL: Well developed, well nourished, no acute distress HEENT: normocephalic, atraumatic, right and left TM's are normal EYES: Conjunctiva normal NECK:normal ROM, supple, no adenopathy PULMONARY:No respiratory distress, normal effort, Lungs: CTAb/l, no wheezes, or increased work of breathing CARDIOVASCULAR: RRR, no murmur ABDOMEN: soft, ND, NT, +'ve BS MUSCULOSKELETAL: Normal ROM of all extremities, some tenderness right lumbar area SKIN: warm and dry without rash PSYCHIATRIC: Mood and affect, behavior are normal  ED Course  Procedures (including critical care time)  Labs Review Labs Reviewed - No data to display  Imaging Review Dg Lumbar Spine Complete  09/02/2015  CLINICAL DATA:  Acute onset of right lower back pain. Chronic left lower back pain. Initial encounter. EXAM: LUMBAR SPINE - COMPLETE 4+ VIEW COMPARISON:  MRI of the lumbar spine performed 01/24/2014, and lumbar spine radiographs performed 01/20/2014 FINDINGS: There is no evidence of fracture or subluxation. Vertebral bodies demonstrate normal height and alignment. Intervertebral disc spaces are preserved. Mild degenerative change is noted at the lower lumbar spine, with sclerotic change at L5-S1, and associated osteophytes. The visualized bowel gas pattern is unremarkable in appearance; air and stool are noted within the colon. The sacroiliac joints are within normal limits. IMPRESSION: No evidence of fracture or subluxation along the lumbar  spine. Mild degenerative change at the lower lumbar spine. Electronically Signed   By: Garald Balding M.D.   On: 09/02/2015 18:32     Visual Acuity Review  Right Eye Distance:   Left Eye Distance:   Bilateral Distance:    Right Eye Near:   Left Eye Near:    Bilateral Near:      Review of XR with patient RX tramadol  MDM   1. Right low back pain, with sciatica presence unspecified      Patient is reassured that there are no issues that require transfer to higher level of care at this time or additional tests. Patient is advised to continue home symptomatic treatment. Patient is advised that if there are new or worsening symptoms to attend the emergency department, contact primary care provider, or return to UC. Instructions of care provided discharged home in stable condition. Return to work/school note provided.  THIS NOTE WAS GENERATED USING A VOICE RECOGNITION SOFTWARE PROGRAM. ALL REASONABLE EFFORTS  WERE MADE TO PROOFREAD THIS DOCUMENT FOR ACCURACY.  I have verbally reviewed the discharge instructions with the patient. A printed AVS was given to the patient.  All questions were answered prior to discharge.      Konrad Felix, [place] 09/02/15 2134

## 2015-09-03 ENCOUNTER — Other Ambulatory Visit: Payer: Self-pay | Admitting: Family Medicine

## 2015-09-03 MED FILL — ?METFORMIN HCL 1,000 MG TAB: 1000 | 30 days supply | Qty: 60 | Fill #0 | Status: TO

## 2015-09-09 MED FILL — GABAPENTIN 300 MG CAPSULE: 300 | 30 days supply | Qty: 270 | Fill #0

## 2015-09-18 LAB — GLUCOSE, POCT (MANUAL RESULT ENTRY): POC GLUCOSE: 160 mg/dL — AB (ref 70–99)

## 2015-09-25 ENCOUNTER — Encounter: Payer: Self-pay | Admitting: Family Medicine

## 2015-09-25 ENCOUNTER — Ambulatory Visit: Payer: Medicaid Other | Attending: Family Medicine | Admitting: Family Medicine

## 2015-09-25 VITALS — BP 130/86 | HR 72 | Temp 98.3°F | Resp 16 | Ht 66.5 in | Wt 239.0 lb

## 2015-09-25 DIAGNOSIS — Z79899 Other long term (current) drug therapy: Secondary | ICD-10-CM | POA: Insufficient documentation

## 2015-09-25 DIAGNOSIS — Z794 Long term (current) use of insulin: Secondary | ICD-10-CM | POA: Diagnosis not present

## 2015-09-25 DIAGNOSIS — M545 Low back pain: Secondary | ICD-10-CM | POA: Diagnosis not present

## 2015-09-25 DIAGNOSIS — E119 Type 2 diabetes mellitus without complications: Secondary | ICD-10-CM | POA: Diagnosis not present

## 2015-09-25 DIAGNOSIS — M5416 Radiculopathy, lumbar region: Secondary | ICD-10-CM | POA: Insufficient documentation

## 2015-09-25 DIAGNOSIS — G8929 Other chronic pain: Secondary | ICD-10-CM | POA: Insufficient documentation

## 2015-09-25 LAB — GLUCOSE, POCT (MANUAL RESULT ENTRY): POC Glucose: 108 mg/dl — AB (ref 70–99)

## 2015-09-25 MED ORDER — ACETAMINOPHEN-CODEINE #3 300-30 MG PO TABS: 1.0000 | ORAL_TABLET | Freq: Three times a day (TID) | ORAL | Status: DC | PRN

## 2015-09-25 MED ORDER — NAPROXEN 500 MG PO TABS: 500.0000 mg | ORAL_TABLET | Freq: Two times a day (BID) | ORAL | Status: DC

## 2015-09-25 MED FILL — NAPROXEN 500 MG TABLET: 500 | 30 days supply | Qty: 30 | Fill #0

## 2015-09-25 NOTE — Progress Notes (Signed)
F/U DM medication changes  Doing today, no problems with new DM medication Glucose been running 115- 160 No tobacco user  No suicidal thoughts in the past two weeks  No pain today

## 2015-09-25 NOTE — Patient Instructions (Addendum)
Quanta was seen today for follow-up.  Diagnoses and all orders for this visit:  Controlled type 2 diabetes mellitus without complication, without long-term current use of insulin (HCC) -     POCT glucose (manual entry)  Lumbar radiculopathy, chronic -     naproxen (NAPROSYN) 500 MG tablet; Take 1 tablet (500 mg total) by mouth 2 (two) times daily with a meal. -     Ambulatory referral to Orthopedic Surgery -     acetaminophen-codeine (TYLENOL #3) 300-30 MG tablet; Take 1 tablet by mouth every 8 (eight) hours as needed for moderate pain.  your colonoscopy was normal on 11/16/2011, repeat in 10 years  F/u in 2 months for diabete;/weight loss   Dr. Adrian Blackwater

## 2015-09-25 NOTE — Assessment & Plan Note (Signed)
A; chronic pain now with hip involvement. Most likely radicular pain. P: Ortho referral Continue naproxen prn with food Continue tylenol #3

## 2015-09-25 NOTE — Assessment & Plan Note (Signed)
Remains well controlled One pound down on byetta  Continue to work towards weight loss

## 2015-09-25 NOTE — Progress Notes (Signed)
Subjective:  Patient ID: Briana Alvarez, female    DOB: Mar 01, 1961  Age: 55 y.o. MRN: [id number]  CC: Follow-up   HPI [name] presents for   1. CHRONIC DIABETES  Disease Monitoring  Blood Sugar Ranges: 115-160  Polyuria: no   Visual problems: no   Medication Compliance: yes, added byetta with help with weight loss efforts  Medication Side Effects  Hypoglycemia: no   2. Low back pain: this is chronic. Worse on L. Also with L gluteal and anterior hip pain. The medrol dose pak helped. She would like a referral to ortho. She sees Dr. Louanne Skye at [facility].   Social History  Substance Use Topics  . Smoking status: Never Smoker   . Smokeless tobacco: Never Used  . Alcohol Use: No    Outpatient Prescriptions Prior to Visit  Medication Sig Dispense Refill  . acetaminophen-codeine (TYLENOL #3) 300-30 MG tablet Take 1 tablet by mouth every 8 (eight) hours as needed for moderate pain. 60 tablet 2  . albuterol (PROVENTIL HFA;VENTOLIN HFA) 108 (90 BASE) MCG/ACT inhaler Inhale 2 puffs into the lungs every 6 (six) hours as needed for wheezing or shortness of breath. 1 Inhaler 2  . benazepril (LOTENSIN) 10 MG tablet Take 1 tablet (10 mg total) by mouth daily. 30 tablet 11  . Brexpiprazole (REXULTI) 1 MG TABS Take 1 tablet by mouth at bedtime.     Marland Kitchen buPROPion (WELLBUTRIN XL) 150 MG 24 hr tablet Take 1 tablet (150 mg total) by mouth daily. 30 tablet 3  . cyclobenzaprine (FLEXERIL) 10 MG tablet Take 1 tablet (10 mg total) by mouth 3 (three) times daily as needed for muscle spasms. 30 tablet 0  . diazepam (VALIUM) 5 MG tablet Take 1 tablet (5 mg total) by mouth every 8 (eight) hours as needed for anxiety. 10 tablet 0  . DULoxetine (CYMBALTA) 20 MG capsule Take 1 capsule (20 mg total) by mouth daily. Take 20 mg daily for one week, then every other day for one week then STOP 15 capsule 0  . exenatide (BYETTA 5 MCG PEN) 5 MCG/0.02ML SOPN injection Inject 0.02 mLs (5 mcg total) into the skin 2  (two) times daily with a meal. 1.2 mL 3  . gabapentin (NEURONTIN) 300 MG capsule TAKE 3 CAPSULES BY MOUTH 3 TIMES DAILY. 270 capsule 2  . glucose monitoring kit (FREESTYLE) monitoring kit 1 each by Does not apply route as needed for other. Dispense any model that is covered- dispense testing supplies for Q AC/ HS accuchecks- 1 month supply with one refil. 1 each 1  . Insulin Pen Needle (B-D ULTRAFINE III SHORT PEN) 31G X 8 MM MISC 1 application by Does not apply route 2 (two) times daily. 100 each 3  . metFORMIN (GLUCOPHAGE) 1000 MG tablet TAKE 1 TABLET BY MOUTH 2 TIMES DAILY WITH A MEAL. 60 tablet 3  . Multiple Vitamins-Minerals (WOMENS 50+ MULTI VITAMIN/MIN PO) Take 1 tablet by mouth daily.    . traMADol (ULTRAM) 50 MG tablet Take 1 tablet (50 mg total) by mouth every 6 (six) hours as needed. 15 tablet 0  . traZODone (DESYREL) 150 MG tablet Take 150 mg by mouth at bedtime.    . methylPREDNISolone (MEDROL DOSEPAK) 4 MG TBPK tablet Taper per packet insert (Patient not taking: Reported on 09/25/2015) 21 tablet 0   No facility-administered medications prior to visit.    ROS Review of Systems  Constitutional: Negative for fever and chills.  Eyes: Negative for visual disturbance.  Respiratory: Negative for shortness of breath.   Cardiovascular: Negative for chest pain.  Gastrointestinal: Negative for abdominal pain and blood in stool.  Musculoskeletal: Positive for myalgias, arthralgias and gait problem. Negative for back pain.  Skin: Negative for rash.  Allergic/Immunologic: Negative for immunocompromised state.  Neurological: Negative for tremors.  Hematological: Negative for adenopathy. Does not bruise/bleed easily.  Psychiatric/Behavioral: Negative for suicidal ideas and dysphoric mood.    Objective:  BP 130/86 mmHg  Pulse 72  Temp(Src) 98.3 F (36.8 C) (Oral)  Resp 16  Ht 5' 6.5" (1.689 m)  Wt 239 lb (108.41 kg)  BMI 38.00 kg/m2  BP/Weight 09/25/2015 09/02/2015 1/97/5883  Systolic  BP 254 90 982  Diastolic BP 86 65 85  Wt. (Lbs) 239 - -  BMI 38 - -   Wt Readings from Last 3 Encounters:  09/25/15 239 lb (108.41 kg)  08/27/15 240 lb 9.6 oz (109.135 kg)  08/06/15 236 lb (107.049 kg)     Physical Exam  Constitutional: She is oriented to person, place, and time. She appears well-developed and well-nourished. No distress.  HENT:  Head: Normocephalic and atraumatic.  Cardiovascular: Normal rate, regular rhythm, normal heart sounds and intact distal pulses.   Pulmonary/Chest: Effort normal and breath sounds normal.  Musculoskeletal: She exhibits no edema.       Left hip: She exhibits decreased range of motion and tenderness. She exhibits normal strength, no bony tenderness, no swelling, no crepitus, no deformity and no laceration.       Legs: Neurological: She is alert and oriented to person, place, and time.  Skin: Skin is warm and dry. No rash noted.  Psychiatric: She has a normal mood and affect.   Lab Results  Component Value Date   HGBA1C 5.80 08/06/2015   CBG 108  Assessment & Plan:   Sarabelle was seen today for follow-up.  Diagnoses and all orders for this visit:  Controlled type 2 diabetes mellitus without complication, without long-term current use of insulin (HCC) -     POCT glucose (manual entry)  Lumbar radiculopathy, chronic -     naproxen (NAPROSYN) 500 MG tablet; Take 1 tablet (500 mg total) by mouth 2 (two) times daily with a meal. -     Ambulatory referral to Orthopedic Surgery -     acetaminophen-codeine (TYLENOL #3) 300-30 MG tablet; Take 1 tablet by mouth every 8 (eight) hours as needed for moderate pain.    No orders of the defined types were placed in this encounter.    Follow-up: No Follow-up on file.   Boykin Nearing MD

## 2015-09-28 ENCOUNTER — Ambulatory Visit (INDEPENDENT_AMBULATORY_CARE_PROVIDER_SITE_OTHER): Payer: Medicaid Other | Admitting: Sports Medicine

## 2015-09-28 ENCOUNTER — Encounter: Payer: Self-pay | Admitting: Sports Medicine

## 2015-09-28 ENCOUNTER — Ambulatory Visit (INDEPENDENT_AMBULATORY_CARE_PROVIDER_SITE_OTHER): Payer: Medicaid Other

## 2015-09-28 VITALS — BP 131/80 | HR 99 | Resp 16

## 2015-09-28 DIAGNOSIS — G5751 Tarsal tunnel syndrome, right lower limb: Secondary | ICD-10-CM | POA: Diagnosis not present

## 2015-09-28 DIAGNOSIS — E1140 Type 2 diabetes mellitus with diabetic neuropathy, unspecified: Secondary | ICD-10-CM

## 2015-09-28 DIAGNOSIS — M19071 Primary osteoarthritis, right ankle and foot: Secondary | ICD-10-CM

## 2015-09-28 DIAGNOSIS — M25571 Pain in right ankle and joints of right foot: Secondary | ICD-10-CM

## 2015-09-28 DIAGNOSIS — M722 Plantar fascial fibromatosis: Secondary | ICD-10-CM | POA: Diagnosis not present

## 2015-09-28 MED ORDER — MELOXICAM 15 MG PO TABS
15.0000 mg | ORAL_TABLET | Freq: Every day | ORAL | Status: DC
Start: 2015-09-28 — End: 2015-10-19

## 2015-09-28 MED ORDER — TRIAMCINOLONE ACETONIDE 10 MG/ML IJ SUSP: 10.0000 mg | Freq: Once | INTRAMUSCULAR | Status: DC

## 2015-09-28 MED FILL — ACETAMINOPHEN/COD #3 TABLET: 300-30 | 20 days supply | Qty: 60 | Fill #0

## 2015-09-28 MED FILL — traMADol HCL 50 MG TABS: 50 | 4 days supply | Qty: 15 | Fill #0

## 2015-09-28 NOTE — Progress Notes (Signed)
Patient ID: Briana Alvarez, female   DOB: Apr 06, 1961, 55 y.o.   MRN: [id number] Subjective: Briana Alvarez is a 55 y.o. diabetic female patient presents to office with complaint of heel pain on the right<left. Patient admits to post static dyskinesia for years in duration. Patient has treated this problem with ice and change in shoes. Patient reports that this treatment has helped a little but has pain also in right ankle that feels like its locked up; reports that she had an ankle injury many years ago. Denies any other problems.  FBS not recorded "good"  Patient Active Problem List   Diagnosis Date Noted  . Morbid obesity ([place]) 08/27/2015  . Left low back pain 08/27/2015  . Diabetic peripheral neuropathy associated with type 2 diabetes mellitus ([place]) 04/10/2015  . Lumbar radiculopathy, chronic 02/18/2014  . Hypertriglyceridemia 01/08/2014  . Diabetes type 2, controlled ([place]) 07/12/2013  . Insomnia 07/12/2013  . Depression 07/12/2013  . Essential hypertension, benign 07/12/2013    Current Outpatient Prescriptions on File Prior to Visit  Medication Sig Dispense Refill  . acetaminophen-codeine (TYLENOL #3) 300-30 MG tablet Take 1 tablet by mouth every 8 (eight) hours as needed for moderate pain. 60 tablet 2  . albuterol (PROVENTIL HFA;VENTOLIN HFA) 108 (90 BASE) MCG/ACT inhaler Inhale 2 puffs into the lungs every 6 (six) hours as needed for wheezing or shortness of breath. 1 Inhaler 2  . benazepril (LOTENSIN) 10 MG tablet Take 1 tablet (10 mg total) by mouth daily. 30 tablet 11  . Brexpiprazole (REXULTI) 1 MG TABS Take 1 tablet by mouth at bedtime.     Marland Kitchen buPROPion (WELLBUTRIN XL) 150 MG 24 hr tablet Take 1 tablet (150 mg total) by mouth daily. 30 tablet 3  . cyclobenzaprine (FLEXERIL) 10 MG tablet Take 1 tablet (10 mg total) by mouth 3 (three) times daily as needed for muscle spasms. 30 tablet 0  . DULoxetine (CYMBALTA) 20 MG capsule Take 1 capsule (20 mg total) by mouth daily. Take 20 mg daily  for one week, then every other day for one week then STOP 15 capsule 0  . exenatide (BYETTA 5 MCG PEN) 5 MCG/0.02ML SOPN injection Inject 0.02 mLs (5 mcg total) into the skin 2 (two) times daily with a meal. 1.2 mL 3  . gabapentin (NEURONTIN) 300 MG capsule TAKE 3 CAPSULES BY MOUTH 3 TIMES DAILY. 270 capsule 2  . glucose monitoring kit (FREESTYLE) monitoring kit 1 each by Does not apply route as needed for other. Dispense any model that is covered- dispense testing supplies for Q AC/ HS accuchecks- 1 month supply with one refil. 1 each 1  . Insulin Pen Needle (B-D ULTRAFINE III SHORT PEN) 31G X 8 MM MISC 1 application by Does not apply route 2 (two) times daily. 100 each 3  . metFORMIN (GLUCOPHAGE) 1000 MG tablet TAKE 1 TABLET BY MOUTH 2 TIMES DAILY WITH A MEAL. 60 tablet 3  . Multiple Vitamins-Minerals (WOMENS 50+ MULTI VITAMIN/MIN PO) Take 1 tablet by mouth daily.    . naproxen (NAPROSYN) 500 MG tablet Take 1 tablet (500 mg total) by mouth 2 (two) times daily with a meal. 30 tablet 0  . traZODone (DESYREL) 150 MG tablet Take 150 mg by mouth at bedtime.     No current facility-administered medications on file prior to visit.    Allergies  Allergen Reactions  . Shrimp [Shellfish Allergy] Anaphylaxis and Hives    Shrimp - hives    Objective: Physical Exam General: The patient is  alert and oriented x3 in no acute distress.  Dermatology: Skin is warm, dry and supple bilateral lower extremities. Nails 1-10 are normal. There is no erythema, edema, no eccymosis, no open lesions present. Integument is otherwise unremarkable.  Vascular: Dorsalis Pedis pulse and Posterior Tibial pulse are 2/4 bilateral. Capillary fill time is immediate to all digits.  Neurological: Grossly intact to light touch with an achilles reflex of +2/5 and a  negative Tinel's sign bilateral.  Musculoskeletal: Tenderness to palpation at the medial calcaneal tubercale and through the insertion of the plantar fascia on the  left<right foot, right lateral ankle and sinus tari. No pain with compression of calcaneus bilateral. No pain with tuning fork to calcaneus bilateral. No pain with calf compression bilateral. There is decreased Ankle joint range of motion bilateral. All other joints range of motion within normal limits bilateral. Strength 5/5 in all groups bilateral.   Xray, Right & Left foot:  Normal osseous mineralization. Joint spaces preserved. No fracture/dislocation/boney destruction. Mild hammertoes, moderate right ankle bone spurs and narrowing suggestive of osteoarthritis, Calcaneal spur present with mild thickening of plantar fascia. No other soft tissue abnormalities or radiopaque foreign bodies.   Assessment and Plan: Problem List Items Addressed This Visit    None    Visit Diagnoses    Ankle pain, right    -  Primary    Relevant Medications    meloxicam (MOBIC) 15 MG tablet    Other Relevant Orders    DG Ankle 2 Views Right    Type 2 diabetes, controlled, with neuropathy (HCC)        Relevant Orders    DG Foot 2 Views Left    DG Foot 2 Views Right    Plantar fasciitis, bilateral        Relevant Medications    meloxicam (MOBIC) 15 MG tablet    triamcinolone acetonide (KENALOG) 10 MG/ML injection 10 mg (Start on 09/28/2015  7:30 PM)    Sinus tarsi syndrome of right ankle        Relevant Medications    meloxicam (MOBIC) 15 MG tablet    Osteoarthritis of ankle and foot, right        Relevant Medications    meloxicam (MOBIC) 15 MG tablet    triamcinolone acetonide (KENALOG) 10 MG/ML injection 10 mg (Start on 09/28/2015  7:30 PM)      -Complete examination performed. Discussed with patient in detail the condition of plantar fasciitis and right ankle pain, how this occurs and general treatment options. Explained both conservative and surgical treatments.  -After oral consent and aseptic prep, injected a mixture containing 1 ml of 2%  plain lidocaine, 1 ml 0.5% plain marcaine, 0.5 ml of kenalog  10 and 0.5 ml of dexamethasone phosphate into left and right heel. Post-injection care discussed with patient.  -Rx Meloxicam -Recommended good supportive shoes and advised use of OTC insert. Explained to patient that if these orthoses work well, we will continue with these. If these do not improve her condition and  pain, we will consider custom molded orthoses from [employer]. -Recommend right ankle brace. -Explained and dispensed to patient daily stretching exercises. -Recommend patient to ice affected area 1-2x daily. -Patient to return to office in 4 weeks for follow up or sooner if problems or questions arise.  Landis Martins, DPM

## 2015-09-28 NOTE — Patient Instructions (Signed)

## 2015-09-30 ENCOUNTER — Telehealth: Payer: Self-pay | Admitting: Family Medicine

## 2015-09-30 NOTE — Telephone Encounter (Signed)
PA from [place] called requesting a immediate urgent appointment with patient's pcp to follow up with patient regarding severe headaches....Marland KitchenMarland KitchenPA stated patient was referred urgently to them and complained of neck pain and headaches... He could treat neck pain but not headaches.. I informed PA the soonest appointment was for 4/20.Marland KitchenMarland KitchenMarland KitchenHe stated she needed to be seen sooner I informed him unfortunately I didn't have at the moment anything sooner with any other provider.phone call ended abruptly.Marland KitchenMarland KitchenMarland Kitchen

## 2015-09-30 NOTE — Telephone Encounter (Signed)
Noted. Patient was referred for lumbar back pain.  She has hx of chronic neck pain s/p C4-5 to C6-7 discectomies with bony fusion.   She was seen on 09/25/14 and did not mention HA.   I agree, she will need to be seen here for HA at first available appt.

## 2015-10-08 ENCOUNTER — Ambulatory Visit: Payer: Medicaid Other | Admitting: Family Medicine

## 2015-10-12 ENCOUNTER — Encounter (HOSPITAL_COMMUNITY): Payer: Self-pay | Admitting: *Deleted

## 2015-10-12 ENCOUNTER — Emergency Department (HOSPITAL_COMMUNITY)
Admission: EM | Admit: 2015-10-12 | Discharge: 2015-10-12 | Disposition: A | Discharge status: Home / Self Care | Payer: Medicaid Other | Source: Home / Self Care

## 2015-10-12 ENCOUNTER — Emergency Department (HOSPITAL_COMMUNITY)
Admission: EM | Admit: 2015-10-12 | Discharge: 2015-10-13 | Disposition: A | Discharge status: Home / Self Care | Payer: Medicaid Other | Attending: Emergency Medicine | Admitting: Emergency Medicine

## 2015-10-12 ENCOUNTER — Encounter (HOSPITAL_COMMUNITY): Payer: Self-pay

## 2015-10-12 DIAGNOSIS — K644 Residual hemorrhoidal skin tags: Secondary | ICD-10-CM | POA: Diagnosis not present

## 2015-10-12 DIAGNOSIS — I10 Essential (primary) hypertension: Secondary | ICD-10-CM

## 2015-10-12 DIAGNOSIS — Z8701 Personal history of pneumonia (recurrent): Secondary | ICD-10-CM | POA: Diagnosis not present

## 2015-10-12 DIAGNOSIS — F419 Anxiety disorder, unspecified: Secondary | ICD-10-CM | POA: Insufficient documentation

## 2015-10-12 DIAGNOSIS — Z791 Long term (current) use of non-steroidal anti-inflammatories (NSAID): Secondary | ICD-10-CM | POA: Diagnosis not present

## 2015-10-12 DIAGNOSIS — R39198 Other difficulties with micturition: Secondary | ICD-10-CM | POA: Insufficient documentation

## 2015-10-12 DIAGNOSIS — M1990 Unspecified osteoarthritis, unspecified site: Secondary | ICD-10-CM | POA: Diagnosis not present

## 2015-10-12 DIAGNOSIS — Z794 Long term (current) use of insulin: Secondary | ICD-10-CM | POA: Insufficient documentation

## 2015-10-12 DIAGNOSIS — G629 Polyneuropathy, unspecified: Secondary | ICD-10-CM | POA: Insufficient documentation

## 2015-10-12 DIAGNOSIS — K5641 Fecal impaction: Secondary | ICD-10-CM | POA: Diagnosis not present

## 2015-10-12 DIAGNOSIS — K921 Melena: Secondary | ICD-10-CM | POA: Insufficient documentation

## 2015-10-12 DIAGNOSIS — R109 Unspecified abdominal pain: Secondary | ICD-10-CM

## 2015-10-12 DIAGNOSIS — Z79899 Other long term (current) drug therapy: Secondary | ICD-10-CM | POA: Insufficient documentation

## 2015-10-12 DIAGNOSIS — Z7984 Long term (current) use of oral hypoglycemic drugs: Secondary | ICD-10-CM | POA: Insufficient documentation

## 2015-10-12 DIAGNOSIS — F329 Major depressive disorder, single episode, unspecified: Secondary | ICD-10-CM | POA: Insufficient documentation

## 2015-10-12 DIAGNOSIS — E119 Type 2 diabetes mellitus without complications: Secondary | ICD-10-CM

## 2015-10-12 LAB — URINALYSIS, ROUTINE W REFLEX MICROSCOPIC
BILIRUBIN URINE: NEGATIVE
Glucose, UA: >1000 mg/dL — AB
Ketones, ur: NEGATIVE mg/dL
LEUKOCYTES UA: NEGATIVE
NITRITE: NEGATIVE
PH: 5.5 (ref 5.0–8.0)
Protein, ur: NEGATIVE mg/dL
SPECIFIC GRAVITY, URINE: 1.031 — AB (ref 1.005–1.030)

## 2015-10-12 LAB — URINE MICROSCOPIC-ADD ON

## 2015-10-12 LAB — COMPREHENSIVE METABOLIC PANEL
ALBUMIN: 3.5 g/dL (ref 3.5–5.0)
ALK PHOS: 133 U/L — AB (ref 38–126)
ALT: 19 U/L (ref 14–54)
ANION GAP: 12 (ref 5–15)
AST: 25 U/L (ref 15–41)
BILIRUBIN TOTAL: 0.6 mg/dL (ref 0.3–1.2)
BUN: 16 mg/dL (ref 6–20)
CALCIUM: 9.1 mg/dL (ref 8.9–10.3)
CO2: 24 mmol/L (ref 22–32)
Chloride: 103 mmol/L (ref 101–111)
Creatinine, Ser: 1.32 mg/dL — ABNORMAL HIGH (ref 0.44–1.00)
GFR calc non Af Amer: 44 mL/min — ABNORMAL LOW (ref >60)
GFR, EST AFRICAN AMERICAN: 52 mL/min — AB (ref >60)
Glucose, Bld: 297 mg/dL — ABNORMAL HIGH (ref 65–99)
POTASSIUM: 4 mmol/L (ref 3.5–5.1)
SODIUM: 139 mmol/L (ref 135–145)
TOTAL PROTEIN: 5.7 g/dL — AB (ref 6.5–8.1)

## 2015-10-12 LAB — CBC
HEMATOCRIT: 41.2 % (ref 36.0–46.0)
HEMOGLOBIN: 13.1 g/dL (ref 12.0–15.0)
MCH: 32 pg (ref 26.0–34.0)
MCHC: 31.8 g/dL (ref 30.0–36.0)
MCV: 100.7 fL — ABNORMAL HIGH (ref 78.0–100.0)
Platelets: 157 10*3/uL (ref 150–400)
RBC: 4.09 MIL/uL (ref 3.87–5.11)
RDW: 15.1 % (ref 11.5–15.5)
WBC: 9.5 10*3/uL (ref 4.0–10.5)

## 2015-10-12 LAB — LIPASE, BLOOD: Lipase: 40 U/L (ref 11–51)

## 2015-10-12 NOTE — ED Notes (Signed)
Pt complains of being constipated for four days, no nausea or vomiting but she complains of lower abdominal pain and pressure, states that she has seen some blood when she tries to go to the bathroom

## 2015-10-12 NOTE — ED Notes (Signed)
Pt is going to [facility] to be seen

## 2015-10-12 NOTE — ED Notes (Signed)
The pt has been taking tylenol with codeine  No bm for 4 days

## 2015-10-13 LAB — CBC WITH DIFFERENTIAL/PLATELET
BASOS ABS: 0 10*3/uL (ref 0.0–0.1)
Basophils Relative: 0 %
EOS PCT: 2 %
Eosinophils Absolute: 0.1 10*3/uL (ref 0.0–0.7)
HEMATOCRIT: 41.5 % (ref 36.0–46.0)
HEMOGLOBIN: 13.6 g/dL (ref 12.0–15.0)
LYMPHS ABS: 2.2 10*3/uL (ref 0.7–4.0)
LYMPHS PCT: 24 %
MCH: 32.6 pg (ref 26.0–34.0)
MCHC: 32.8 g/dL (ref 30.0–36.0)
MCV: 99.5 fL (ref 78.0–100.0)
Monocytes Absolute: 0.7 10*3/uL (ref 0.1–1.0)
Monocytes Relative: 7 %
NEUTROS ABS: 5.9 10*3/uL (ref 1.7–7.7)
Neutrophils Relative %: 67 %
PLATELETS: 143 10*3/uL — AB (ref 150–400)
RBC: 4.17 MIL/uL (ref 3.87–5.11)
RDW: 15.2 % (ref 11.5–15.5)
WBC: 8.9 10*3/uL (ref 4.0–10.5)

## 2015-10-13 LAB — COMPREHENSIVE METABOLIC PANEL
ALT: 18 U/L (ref 14–54)
AST: 22 U/L (ref 15–41)
Albumin: 3.8 g/dL (ref 3.5–5.0)
Alkaline Phosphatase: 127 U/L — ABNORMAL HIGH (ref 38–126)
Anion gap: 5 (ref 5–15)
BILIRUBIN TOTAL: 0.5 mg/dL (ref 0.3–1.2)
BUN: 21 mg/dL — AB (ref 6–20)
CHLORIDE: 106 mmol/L (ref 101–111)
CO2: 26 mmol/L (ref 22–32)
CREATININE: 1.11 mg/dL — AB (ref 0.44–1.00)
Calcium: 9.2 mg/dL (ref 8.9–10.3)
GFR calc Af Amer: >60 mL/min (ref >60)
GFR, EST NON AFRICAN AMERICAN: 55 mL/min — AB (ref >60)
GLUCOSE: 211 mg/dL — AB (ref 65–99)
POTASSIUM: 3.9 mmol/L (ref 3.5–5.1)
Sodium: 137 mmol/L (ref 135–145)
Total Protein: 6.2 g/dL — ABNORMAL LOW (ref 6.5–8.1)

## 2015-10-13 LAB — URINALYSIS, ROUTINE W REFLEX MICROSCOPIC
BILIRUBIN URINE: NEGATIVE
HGB URINE DIPSTICK: NEGATIVE
Ketones, ur: NEGATIVE mg/dL
Leukocytes, UA: NEGATIVE
Nitrite: NEGATIVE
Protein, ur: NEGATIVE mg/dL
SPECIFIC GRAVITY, URINE: 1.031 — AB (ref 1.005–1.030)
pH: 5.5 (ref 5.0–8.0)

## 2015-10-13 LAB — URINE MICROSCOPIC-ADD ON: BACTERIA UA: NONE SEEN

## 2015-10-13 LAB — POC OCCULT BLOOD, ED: Fecal Occult Bld: POSITIVE — AB

## 2015-10-13 MED ORDER — MILK AND MOLASSES ENEMA
1.0000 | Freq: Once | RECTAL | Status: AC
  Administered 2015-10-13: 250 mL via RECTAL
  Filled 2015-10-13: qty 250

## 2015-10-13 MED ORDER — MAGNESIUM CITRATE PO SOLN: 1.0000 | Freq: Once | ORAL | Status: DC

## 2015-10-13 NOTE — ED Notes (Signed)
Patient reports she had a large bowel movement and feels " a whole lot better".

## 2015-10-13 NOTE — ED Provider Notes (Signed)
CSN: [id number]     Arrival date & time 10/12/15  2033 History   First MD Initiated Contact with Patient 10/13/15 0029     Chief Complaint  Patient presents with  . Constipation     (Consider location/radiation/quality/duration/timing/severity/associated sxs/prior Treatment) HPI Comments: Patient with history of abdominal hysterectomy, Tylenol 3 use for chronic back pain -- presents with complaint of constipation for the past 4 days. Patient has had generalized abdominal pain as well. Patient has been unable to urinate since approximately 1 PM yesterday. She was straining hard to go today and noted blood in her stool. She has history of normal colonoscopy. No vomiting, chest pain, shortness of breath. No fevers or diarrhea. Patient is passing gas. No urinary symptoms. She's tried MiraLAX and over-the-counter laxatives without relief. Onset of symptoms acute. Course is gradually worsening. Nothing makes symptoms better or worse.  Patient is a 55 y.o. female presenting with constipation. The history is provided by the patient.  Constipation Associated symptoms: abdominal pain   Associated symptoms: no diarrhea, no dysuria, no fever, no nausea and no vomiting     Past Medical History  Diagnosis Date  . Diabetes mellitus   . Hypertension   . Depression   . Anxiety   . Pneumonia   . Headache     "2 a month"  . Neuropathy ([place])   . Arthritis    Past Surgical History  Procedure Laterality Date  . Abdominal hysterectomy    . Carpal tunnel release    . Ulner nerve     . Tonsillectomy    . Neck surgery    . Knee surgery Left   . Knee arthroscopy Right    Family History  Problem Relation Age of Onset  . Cancer Mother   . Diabetes Daughter   . Diabetes Maternal Grandmother   . Heart attack Neg Hx   . Hypertension Mother   . Hypertension Maternal Grandmother   . Stroke Neg Hx    Social History  Substance Use Topics  . Smoking status: Never Smoker   . Smokeless tobacco: Never  Used  . Alcohol Use: No   OB History    No data available     Review of Systems  Constitutional: Negative for fever.  HENT: Negative for rhinorrhea and sore throat.   Eyes: Negative for redness.  Respiratory: Negative for cough.   Cardiovascular: Negative for chest pain.  Gastrointestinal: Positive for abdominal pain and constipation. Negative for nausea, vomiting and diarrhea.  Genitourinary: Positive for difficulty urinating. Negative for dysuria and frequency.  Musculoskeletal: Negative for myalgias.  Skin: Negative for rash.  Neurological: Negative for headaches.    Allergies  Shrimp  Home Medications   Prior to Admission medications   Medication Sig Start Date End Date Taking? Authorizing Provider  acetaminophen-codeine (TYLENOL #3) 300-30 MG tablet Take 1 tablet by mouth every 8 (eight) hours as needed for moderate pain. 09/25/15   Josalyn Funches, MD  albuterol (PROVENTIL HFA;VENTOLIN HFA) 108 (90 BASE) MCG/ACT inhaler Inhale 2 puffs into the lungs every 6 (six) hours as needed for wheezing or shortness of breath. 05/09/14   Gregor Hams, MD  benazepril (LOTENSIN) 10 MG tablet Take 1 tablet (10 mg total) by mouth daily. 12/16/14   Tresa Garter, MD  Brexpiprazole (REXULTI) 1 MG TABS Take 1 tablet by mouth at bedtime.     Historical Provider, MD  buPROPion (WELLBUTRIN XL) 150 MG 24 hr tablet Take 1 tablet (150 mg total) by  mouth daily. 08/06/15   Josalyn Funches, MD  cyclobenzaprine (FLEXERIL) 10 MG tablet Take 1 tablet (10 mg total) by mouth 3 (three) times daily as needed for muscle spasms. 08/27/15   Josalyn Funches, MD  DULoxetine (CYMBALTA) 20 MG capsule Take 1 capsule (20 mg total) by mouth daily. Take 20 mg daily for one week, then every other day for one week then STOP 08/27/15   Josalyn Funches, MD  exenatide (BYETTA 5 MCG PEN) 5 MCG/0.02ML SOPN injection Inject 0.02 mLs (5 mcg total) into the skin 2 (two) times daily with a meal. 08/27/15   Josalyn Funches, MD   gabapentin (NEURONTIN) 300 MG capsule TAKE 3 CAPSULES BY MOUTH 3 TIMES DAILY. 09/04/15   Josalyn Funches, MD  glucose monitoring kit (FREESTYLE) monitoring kit 1 each by Does not apply route as needed for other. Dispense any model that is covered- dispense testing supplies for Q AC/ HS accuchecks- 1 month supply with one refil. 01/06/15   Lorayne Marek, MD  Insulin Pen Needle (B-D ULTRAFINE III SHORT PEN) 31G X 8 MM MISC 1 application by Does not apply route 2 (two) times daily. 08/27/15   Josalyn Funches, MD  meloxicam (MOBIC) 15 MG tablet Take 1 tablet (15 mg total) by mouth daily. 09/28/15   Titorya Stover, DPM  metFORMIN (GLUCOPHAGE) 1000 MG tablet TAKE 1 TABLET BY MOUTH 2 TIMES DAILY WITH A MEAL. 09/01/15   Boykin Nearing, MD  Multiple Vitamins-Minerals (WOMENS 50+ MULTI VITAMIN/MIN PO) Take 1 tablet by mouth daily.    Historical Provider, MD  naproxen (NAPROSYN) 500 MG tablet Take 1 tablet (500 mg total) by mouth 2 (two) times daily with a meal. 09/25/15   Josalyn Funches, MD  traZODone (DESYREL) 150 MG tablet Take 150 mg by mouth at bedtime.    Historical Provider, MD   BP 184/102 mmHg  Pulse 64  Temp(Src) 98 F (36.7 C) (Oral)  Resp 20  Ht 5' 6.5" (1.689 m)  Wt 110.337 kg  BMI 38.68 kg/m2  SpO2 98%   Physical Exam  Constitutional: She appears well-developed and well-nourished.  HENT:  Head: Normocephalic and atraumatic.  Eyes: Conjunctivae are normal. Right eye exhibits no discharge. Left eye exhibits no discharge.  Neck: Normal range of motion. Neck supple.  Cardiovascular: Normal rate, regular rhythm and normal heart sounds.   Pulmonary/Chest: Effort normal and breath sounds normal.  Abdominal: Soft. There is tenderness. There is no rebound and no guarding.  Lower abdominal pain, moderate.  Genitourinary: Rectal exam shows external hemorrhoid (non-thrombosed) and tenderness. Rectal exam shows no internal hemorrhoid, no fissure, no mass and anal tone normal. Guaiac positive stool.   Positive fecal impaction  Neurological: She is alert.  Skin: Skin is warm and dry.  Psychiatric: She has a normal mood and affect.  Nursing note and vitals reviewed.   ED Course  Procedures (including critical care time) Labs Review Labs Reviewed  CBC WITH DIFFERENTIAL/PLATELET - Abnormal; Notable for the following:    Platelets 143 (*)    All other components within normal limits  COMPREHENSIVE METABOLIC PANEL - Abnormal; Notable for the following:    Glucose, Bld 211 (*)    BUN 21 (*)    Creatinine, Ser 1.11 (*)    Total Protein 6.2 (*)    Alkaline Phosphatase 127 (*)    GFR calc non Af Amer 55 (*)    All other components within normal limits  URINALYSIS, ROUTINE W REFLEX MICROSCOPIC (NOT AT [facility]) - Abnormal; Notable for the  following:    Specific Gravity, Urine 1.031 (*)    Glucose, UA >1000 (*)    All other components within normal limits  URINE MICROSCOPIC-ADD ON - Abnormal; Notable for the following:    Squamous Epithelial / LPF 0-5 (*)    All other components within normal limits  POC OCCULT BLOOD, ED - Abnormal; Notable for the following:    Fecal Occult Bld POSITIVE (*)    All other components within normal limits  OCCULT BLOOD X 1 CARD TO LAB, STOOL    12:39 AM Patient seen and examined. Most pressing issue is that patient has not been able to urinate for 12 hours. Will scan bladder, cath if needed, and reassess.   Vital signs reviewed and are as follows: BP 184/102 mmHg  Pulse 64  Temp(Src) 98 F (36.7 C) (Oral)  Resp 20  Ht 5' 6.5" (1.689 m)  Wt 110.337 kg  BMI 38.68 kg/m2  SpO2 98%  Lower abdominal pain improved with cath, however patient still has urged to defecate.   Rectal exam performed with chaperone. Patient has large ball of stool that I can feel at the end of my fingertip. Enema ordered.   Patient had complete enema and was able to pass a large amount of stool. Her symptoms are much improved. She has been able to urinate without problem. She  feels better and would like to go home. I encouraged patient to continue using MiraLAX of next week. If she is taking Tylenol 3, she will need to take a stool softener. Also encouraged hydration and good dietary fiber intake.  Patient was informed that she does have blood in her stool. She will need to monitor this and follow up closely with her doctor for recheck. She verbalizes understanding and agrees with plan.  MDM   Final diagnoses:  Fecal impaction ([place])  Hematochezia  Essential hypertension   Fecal impaction: Resolved with enema.  Urinary retention: Likely related to fecal impaction. Improved with treatment of impaction.  Hematochezia: Possibly related the patient straining, however patient will need recheck to ensure that this improves. Normal hemoglobin.  Hypertension: PCP follow-up. No indication of end organ damage. Patient encouraged to continue her medications.  Diabetes mellitus: No concerns for DKA.    Carlisle Cater, PA-C 10/13/15 [place], MD 10/16/15 1018

## 2015-10-13 NOTE — Discharge Instructions (Signed)
Please read and follow all provided instructions.  Your diagnoses today include:  1. Fecal impaction ([place])   2. Hematochezia   3. Essential hypertension     Tests performed today include:  Blood counts and electrolytes  Hemoccult - shows some blood in stool  Vital signs. See below for your results today.   Medications prescribed:   None  Take any medications only as directed on prescription or on packaging.   Home care instructions:  Follow any educational materials contained in this packet.  Follow-up instructions: Please follow-up with your primary care provider in the next week for further evaluation of your symptoms.   Return instructions:   Please return to the Emergency Department if you experience worsening symptoms.   Please return if you have worsening abdominal pain, swelling of your abdomen, persistent vomiting, blood in your stool or vomit, or fever.   Please return if you have any other emergent concerns.  Additional Information:  Your vital signs today were: BP 184/102 mmHg   Pulse 64   Temp(Src) 98 F (36.7 C) (Oral)   Resp 20   Ht 5' 6.5" (1.689 m)   Wt 110.337 kg   BMI 38.68 kg/m2   SpO2 98% If your blood pressure (BP) was elevated above 135/85 this visit, please have this repeated by your doctor within one month. --------------

## 2015-10-13 NOTE — ED Notes (Signed)
Administered the second half of the enema. Pt has ambulated to restroom.

## 2015-10-13 NOTE — ED Notes (Signed)
Patient to restroom with giving half of the enema, she reported she "had very little" results. Will give rest of enema to see about results.

## 2015-10-15 MED FILL — GABAPENTIN 300 MG CAPSULE: 300 | 30 days supply | Qty: 270 | Fill #1 | Status: TO

## 2015-10-15 MED FILL — BENAZEPRIL HCL 10 MG TABLET: 10 | 30 days supply | Qty: 30 | Fill #1 | Status: TO

## 2015-10-15 MED FILL — metFORMIN HCL 1000 MG TABS: 1000 | 30 days supply | Qty: 60 | Fill #1 | Status: TO

## 2015-10-16 ENCOUNTER — Ambulatory Visit (INDEPENDENT_AMBULATORY_CARE_PROVIDER_SITE_OTHER): Payer: Medicaid Other | Admitting: Diagnostic Neuroimaging

## 2015-10-16 ENCOUNTER — Encounter: Payer: Self-pay | Admitting: Diagnostic Neuroimaging

## 2015-10-16 VITALS — BP 156/110 | HR 74 | Resp 18 | Ht 66.5 in | Wt 238.6 lb

## 2015-10-16 DIAGNOSIS — G43709 Chronic migraine without aura, not intractable, without status migrainosus: Secondary | ICD-10-CM

## 2015-10-16 DIAGNOSIS — G43001 Migraine without aura, not intractable, with status migrainosus: Secondary | ICD-10-CM | POA: Diagnosis not present

## 2015-10-16 DIAGNOSIS — H538 Other visual disturbances: Secondary | ICD-10-CM

## 2015-10-16 DIAGNOSIS — M542 Cervicalgia: Secondary | ICD-10-CM

## 2015-10-16 DIAGNOSIS — E669 Obesity, unspecified: Secondary | ICD-10-CM | POA: Diagnosis not present

## 2015-10-16 DIAGNOSIS — R42 Dizziness and giddiness: Secondary | ICD-10-CM | POA: Diagnosis not present

## 2015-10-16 DIAGNOSIS — R0683 Snoring: Secondary | ICD-10-CM

## 2015-10-16 DIAGNOSIS — G4719 Other hypersomnia: Secondary | ICD-10-CM | POA: Diagnosis not present

## 2015-10-16 MED ORDER — RIZATRIPTAN BENZOATE 10 MG PO TBDP: 10.0000 mg | ORAL_TABLET | ORAL | Status: DC | PRN

## 2015-10-16 MED ORDER — TOPIRAMATE 50 MG PO TABS: 50.0000 mg | ORAL_TABLET | Freq: Two times a day (BID) | ORAL | Status: DC

## 2015-10-16 MED FILL — TOPIRAMATE 50 MG TABLET: 50 | 30 days supply | Qty: 60 | Fill #0 | Status: TO

## 2015-10-16 MED FILL — RIZATRIPTAN 10 MG ODT: 10 | 30 days supply | Qty: 9 | Fill #0 | Status: TO

## 2015-10-16 NOTE — Progress Notes (Signed)
[facility]  PATIENT: Briana Alvarez DOB: Dec 29, 1960  REFERRING CLINICIAN: Ricard Dillon HISTORY FROM: patient  REASON FOR VISIT: new consult    HISTORICAL  CHIEF COMPLAINT:  Chief Complaint  Patient presents with  . Headaches    Sts. has had daily h/a's for the last 3-4 years.  Sts. pcp has treated in the past for same--he has rx'd Tyl. #3 but she is unable to tolerate this due to constipation.  Sts. she's never tried any other meds for h/a.  She would like to discuss other tx. options.  Hx. of neck surgery 2013/fim    HISTORY OF PRESENT ILLNESS:   55 year old right-handed female with hypertension, diabetes, depression, anxiety, here for evaluation of headaches.  Patient has had bilateral occipital headaches radiating to the bilateral parietal and frontal regions for the past 4 years. Patient has had daily headaches since that time. Headaches associated with throbbing and pressure sensation, photophobia, dizziness and blurred vision. No nausea vomiting. Twice a week headaches are more severe in intensity patient has to go lay down a dark room. No specific triggering factors. Patient has family history of migraine in her mother and sister. Patient has history of C4-C7 cervical spine surgery.  Patient also has snoring, hypertension, obesity, excessive daytime sleepiness. She's never had a sleep study.   REVIEW OF SYSTEMS: Full 14 system review of systems performed and negative with exception of: Fatigue swelling in legs insomnia snoring restless legs headache dizziness passing out anxiety depression cramps constipation racing thoughts not asleep.  ALLERGIES: Allergies  Allergen Reactions  . Shrimp [Shellfish Allergy] Anaphylaxis and Hives    Shrimp - hives    HOME MEDICATIONS: Outpatient Prescriptions Prior to Visit  Medication Sig Dispense Refill  . acetaminophen-codeine (TYLENOL #3) 300-30 MG tablet Take 1 tablet by mouth every 8 (eight) hours as needed for  moderate pain. 60 tablet 2  . albuterol (PROVENTIL HFA;VENTOLIN HFA) 108 (90 BASE) MCG/ACT inhaler Inhale 2 puffs into the lungs every 6 (six) hours as needed for wheezing or shortness of breath. 1 Inhaler 2  . benazepril (LOTENSIN) 10 MG tablet Take 1 tablet (10 mg total) by mouth daily. 30 tablet 11  . buPROPion (WELLBUTRIN XL) 150 MG 24 hr tablet Take 1 tablet (150 mg total) by mouth daily. 30 tablet 3  . exenatide (BYETTA 5 MCG PEN) 5 MCG/0.02ML SOPN injection Inject 0.02 mLs (5 mcg total) into the skin 2 (two) times daily with a meal. 1.2 mL 3  . gabapentin (NEURONTIN) 300 MG capsule TAKE 3 CAPSULES BY MOUTH 3 TIMES DAILY. 270 capsule 2  . glucose monitoring kit (FREESTYLE) monitoring kit 1 each by Does not apply route as needed for other. Dispense any model that is covered- dispense testing supplies for Q AC/ HS accuchecks- 1 month supply with one refil. 1 each 1  . Insulin Pen Needle (B-D ULTRAFINE III SHORT PEN) 31G X 8 MM MISC 1 application by Does not apply route 2 (two) times daily. 100 each 3  . metFORMIN (GLUCOPHAGE) 1000 MG tablet TAKE 1 TABLET BY MOUTH 2 TIMES DAILY WITH A MEAL. 60 tablet 3  . Multiple Vitamins-Minerals (WOMENS 50+ MULTI VITAMIN/MIN PO) Take 1 tablet by mouth daily.    . naproxen (NAPROSYN) 500 MG tablet Take 1 tablet (500 mg total) by mouth 2 (two) times daily with a meal. 30 tablet 0  . traZODone (DESYREL) 150 MG tablet Take 150 mg by mouth at bedtime.    . cyclobenzaprine (FLEXERIL) 10 MG tablet  Take 1 tablet (10 mg total) by mouth 3 (three) times daily as needed for muscle spasms. (Patient not taking: Reported on 10/13/2015) 30 tablet 0  . DULoxetine (CYMBALTA) 20 MG capsule Take 1 capsule (20 mg total) by mouth daily. Take 20 mg daily for one week, then every other day for one week then STOP (Patient not taking: Reported on 10/13/2015) 15 capsule 0  . meloxicam (MOBIC) 15 MG tablet Take 1 tablet (15 mg total) by mouth daily. (Patient not taking: Reported on 10/13/2015)  30 tablet 0   Facility-Administered Medications Prior to Visit  Medication Dose Route Frequency Provider Last Rate Last Dose  . triamcinolone acetonide (KENALOG) 10 MG/ML injection 10 mg  10 mg Other Once Landis Martins, DPM        PAST MEDICAL HISTORY: Past Medical History  Diagnosis Date  . Diabetes mellitus   . Hypertension   . Depression   . Anxiety   . Pneumonia   . Headache     "2 a month"  . Neuropathy ([place])   . Arthritis     PAST SURGICAL HISTORY: Past Surgical History  Procedure Laterality Date  . Abdominal hysterectomy    . Carpal tunnel release    . Ulner nerve     . Tonsillectomy    . Neck surgery    . Knee surgery Left   . Knee arthroscopy Right     FAMILY HISTORY: Family History  Problem Relation Age of Onset  . Cancer Mother   . Diabetes Daughter   . Diabetes Maternal Grandmother   . Heart attack Neg Hx   . Hypertension Mother   . Hypertension Maternal Grandmother   . Stroke Neg Hx     SOCIAL HISTORY:  Social History   Social History  . Marital Status: Divorced    Spouse Name: N/A  . Number of Children: N/A  . Years of Education: N/A   Occupational History  . Not on file.   Social History Main Topics  . Smoking status: Never Smoker   . Smokeless tobacco: Never Used  . Alcohol Use: No  . Drug Use: No  . Sexual Activity: Not on file   Other Topics Concern  . Not on file   Social History Narrative     PHYSICAL EXAM  GENERAL EXAM/CONSTITUTIONAL: Vitals:  Filed Vitals:   10/16/15 1025  BP: 156/110  Pulse: 74  Resp: 18  Height: 5' 6.5" (1.689 m)  Weight: 238 lb 9.6 oz (108.228 kg)     Body mass index is 37.94 kg/(m^2).  No exam data present  Patient is in no distress; well developed, nourished and groomed; neck is supple  CARDIOVASCULAR:  Examination of carotid arteries is normal; no carotid bruits  Regular rate and rhythm, no murmurs  Examination of peripheral vascular system by observation and palpation is  normal  EYES:  Ophthalmoscopic exam of optic discs and posterior segments is normal; no papilledema or hemorrhages  MUSCULOSKELETAL:  Gait, strength, tone, movements noted in Neurologic exam below  NEUROLOGIC: MENTAL STATUS:  No flowsheet data found.  awake, alert, oriented to person, place and time  recent and remote memory intact  normal attention and concentration  language fluent, comprehension intact, naming intact,   fund of knowledge appropriate  CRANIAL NERVE:   2nd - no papilledema on fundoscopic exam  2nd, 3rd, 4th, 6th - pupils equal and reactive to light, visual fields full to confrontation, extraocular muscles intact, no nystagmus  5th - facial sensation symmetric  7th - facial strength symmetric  8th - hearing intact  9th - palate elevates symmetrically, uvula midline  11th - shoulder shrug symmetric  12th - tongue protrusion midline  MOTOR:   normal bulk and tone, full strength in the BUE, BLE  SENSORY:   normal and symmetric to light touch, temperature, vibration  COORDINATION:   finger-nose-finger, fine finger movements normal  REFLEXES:   deep tendon reflexes TRACE and symmetric  GAIT/STATION:   narrow based gait; romberg is negative    DIAGNOSTIC DATA (LABS, IMAGING, TESTING) - I reviewed patient records, labs, notes, testing and imaging myself where available.  Lab Results  Component Value Date   WBC 8.9 10/13/2015   HGB 13.6 10/13/2015   HCT 41.5 10/13/2015   MCV 99.5 10/13/2015   PLT 143* 10/13/2015      Component Value Date/Time   NA 137 10/13/2015 0111   K 3.9 10/13/2015 0111   CL 106 10/13/2015 0111   CO2 26 10/13/2015 0111   GLUCOSE 211* 10/13/2015 0111   BUN 21* 10/13/2015 0111   CREATININE 1.11* 10/13/2015 0111   CREATININE 0.98 06/09/2015 1529   CALCIUM 9.2 10/13/2015 0111   PROT 6.2* 10/13/2015 0111   ALBUMIN 3.8 10/13/2015 0111   AST 22 10/13/2015 0111   ALT 18 10/13/2015 0111   ALKPHOS 127*  10/13/2015 0111   BILITOT 0.5 10/13/2015 0111   GFRNONAA 55* 10/13/2015 0111   GFRNONAA 65 06/09/2015 1529   GFRAA >60 10/13/2015 0111   GFRAA 75 06/09/2015 1529   Lab Results  Component Value Date   CHOL 131 06/16/2014   HDL 34* 06/16/2014   LDLCALC 58 06/16/2014   TRIG 197* 06/16/2014   CHOLHDL 3.9 06/16/2014   Lab Results  Component Value Date   HGBA1C 5.80 08/06/2015   Lab Results  Component Value Date   VITAMINB12 264 06/09/2015   Lab Results  Component Value Date   TSH 2.934 06/09/2015    09/02/15 XRAY LUMBAR SPINE [I reviewed images myself and agree with interpretation. -VRP] - No evidence of fracture or subluxation along the lumbar spine. Mild degenerative change at the lower lumbar spine.  12/26/14 CT head / cervical spine [I reviewed images myself and agree with interpretation. -VRP] 1. No evidence of intracranial or cervical spine injury. 2. C4-5 to C6-7 discectomies with completed bony fusion.  10/30/14 MRI cervical spine [I reviewed images myself and agree with interpretation. -VRP] 1. Prior C4-C7 ACDF with moderate residual spinal stenosis at C5-6 and C6-7. 2. Mild-to-moderate spinal stenosis C3-4, stable to minimally progressed from the prior MRI.     ASSESSMENT AND PLAN  55 y.o. year old female here with daily headaches with migraine features since 2013. Most likely represents intractable chronic migraine. We'll check MRI and sleep study for further evaluation. Will start on migraine medications.  Ddx: migraine, tension HA, sleep apnea, cervicogenic headache, secondary headache  1. Migraine without aura and with status migrainosus, not intractable   2. Chronic migraine without aura without status migrainosus, not intractable   3. Neck pain   4. Dizziness and giddiness   5. Blurred vision   6. Snoring   7. Excessive daytime sleepiness   8. Obesity      PLAN: - start topiramate 30m at bedtime; after 1 week increase to twice a day; drink plenty of  water - rizatriptan 124mas needed for breakthrough headache; may repeat x 1 after 2 hours; max 2 tabs per day or 8 per month - I will  check MRI brain and sleep consult/study - refer to [facility] migraine observational study  Orders Placed This Encounter  Procedures  . MR Brain Wo Contrast  . Ambulatory referral to Sleep Studies   Meds ordered this encounter  Medications  . topiramate (TOPAMAX) 50 MG tablet    Sig: Take 1 tablet (50 mg total) by mouth 2 (two) times daily.    Dispense:  60 tablet    Refill:  12  . rizatriptan (MAXALT-MLT) 10 MG disintegrating tablet    Sig: Take 1 tablet (10 mg total) by mouth as needed for migraine. May repeat in 2 hours if needed    Dispense:  9 tablet    Refill:  11   Return in about 2 months (around 12/16/2015).  I reviewed images, labs, notes, records myself. I summarized findings and reviewed with patient, for this moderate risk condition (severe headache, dizziness, blurred vision, neck pain) requiring high complexity decision making.    Penni Bombard, MD 9/67/8938, 10:17 AM Certified in Neurology, Neurophysiology and Neuroimaging  [facility] [address] [place], [place] [address] [phone]

## 2015-10-16 NOTE — Patient Instructions (Signed)
Thank you for coming to see [place] at [facility]. I hope we have been able to provide you high quality care today.  You may receive a patient satisfaction survey over the next few weeks. We would appreciate your feedback and comments so that we may continue to improve ourselves and the health of our patients.  - start topiramate 69m at bedtime; after 1 week increase to twice a day; drink plenty of water - rizatriptan 163mas needed for breakthrough headache; may repeat x 1 after 2 hours; max 2 tabs per day or 8 per month - I will check MRI brain and sleep consult/study   ~~~~~~~~~~~~~~~~~~~~~~~~~~~~~~~~~~~~~~~~~~~~~~~~~~~~~~~~~~~~~~~~~  DR. PENUMALLI'S GUIDE TO HAPPY AND HEALTHY LIVING These are some of my general health and wellness recommendations. Some of them may apply to you better than others. Please use common sense as you try these suggestions and feel free to ask me any questions.   ACTIVITY/FITNESS Mental, social, emotional and physical stimulation are very important for brain and body health. Try learning a new activity (arts, music, language, sports, games).  Keep moving your body to the best of your abilities. You can do this at home, inside or outside, the park, community center, gym or anywhere you like. Consider a physical therapist or personal trainer to get started. Consider the app Sworkit. Fitness trackers such as smart-watches, smart-phones or Fitbits can help as well.   NUTRITION Eat more plants: colorful vegetables, nuts, seeds and berries.  Eat less sugar, salt, preservatives and processed foods.  Avoid toxins such as cigarettes and alcohol.  Drink water when you are thirsty. Warm water with a slice of lemon is an excellent morning drink to start the day.  Consider these websites for more information The Nutrition [employer] (hthttps://www.henry-hernandez.biz/[employer]  (wwWindowBlog.ch  RELAXATION Consider practicing mindfulness meditation or other relaxation techniques such as deep breathing, prayer, yoga, tai chi, massage. See website mindful.org or the apps Headspace or Calm to help get started.   SLEEP Try to get at least 7-8+ hours sleep per day. Regular exercise and reduced caffeine will help you sleep better. Practice good sleep hygeine techniques. See website sleep.org for more information.   PLANNING Prepare estate planning, living will, healthcare POA documents. Sometimes this is best planned with the help of an attorney. Theconversationproject.org and agingwithdignity.org are excellent resources.

## 2015-10-19 ENCOUNTER — Encounter: Payer: Self-pay | Admitting: Neurology

## 2015-10-19 ENCOUNTER — Ambulatory Visit (INDEPENDENT_AMBULATORY_CARE_PROVIDER_SITE_OTHER): Payer: Medicaid Other | Admitting: Neurology

## 2015-10-19 VITALS — BP 160/102 | HR 76 | Resp 20 | Ht 66.5 in | Wt 233.0 lb

## 2015-10-19 DIAGNOSIS — R51 Headache: Secondary | ICD-10-CM

## 2015-10-19 DIAGNOSIS — G4710 Hypersomnia, unspecified: Secondary | ICD-10-CM | POA: Diagnosis not present

## 2015-10-19 DIAGNOSIS — G4730 Sleep apnea, unspecified: Secondary | ICD-10-CM | POA: Diagnosis not present

## 2015-10-19 DIAGNOSIS — R519 Headache, unspecified: Secondary | ICD-10-CM

## 2015-10-19 DIAGNOSIS — G44019 Episodic cluster headache, not intractable: Secondary | ICD-10-CM | POA: Diagnosis not present

## 2015-10-19 DIAGNOSIS — E662 Morbid (severe) obesity with alveolar hypoventilation: Secondary | ICD-10-CM | POA: Diagnosis not present

## 2015-10-19 MED FILL — BYETTA 5 MCG DOSE PEN INJ: 5 | 30 days supply | Qty: 1 | Fill #1 | Status: TO

## 2015-10-19 NOTE — Progress Notes (Signed)
SLEEP MEDICINE CLINIC   Provider:  Larey Seat, M D  Referring Provider: Boykin Nearing, MD Primary Care Physician:  Minerva Ends, MD  Chief Complaint  Patient presents with  . New Patient (Initial Visit)    snoing, never had sleep study, Dr. Leta Baptist referral, rm 66, alone    HPI:  Briana Alvarez is a 55 y.o. female , seen here as a referral from Dr. Andrey Spearman for a sleep evaluation.   Chief complaint according to patient : The patient reports having daily headaches for the last 3-4 years, but was not able to address these medically as she lacks insurance. She now was able to sign up for Medicaid. Her last medical attention prior was in regards to a neck surgery in 2013 with  fusion of the cervical vertebrae 4 through 7, at [facility].  Briana Alvarez also reports that she feels sleepy all the time, she does not schedule naps but frequently falls asleep inadvertently. Whenever she is at rest and not mentally stimulated or physically active she is is at risk of dozing off. This can be in front of the computer,  reading a book, while watching TV.   Sleep habits are as follows: The patient has an exact bedtime of 9 PM, and describes her bedroom is cool, quiet and dark. She sleeps alone, but shares a house with her daughter. She sleeps on one pillow, and prefers a lateral sleep position. Her daughter and her grandson have told her that she snores loudly, breezes irregularly. The patient feels that she's a deep sleeper and sound sleeper and denies waking up with gasping for air, shortness of air, chest pain palpitations or diaphoresis. She wakes up usually between 2:30 AM and 3 AM to go to the bathroom once but otherwise sleeps through the night. She was placed on trazodone to help her sleep and this has worked well for her. Trazodone has been prescribed since 2011. She spontaneously wakes up at 5:30 AM ready to start her day, she does not rely on an alarm. She  usually is ready to go by that time. She is getting her grandson ready for school and drives him to school. She states that she drinks 4-5 glasses of sweet tea from the morning on, maybe 1 or 2 cans of caffeinated soda, seldom does she drink coffee. After she returns from her school rub in the morning she would like to go to bed again. By lunchtime she watches TV and very often falls asleep. She fights sleeping but sometimes it is an irresistible urge to go and have a nap. This is somewhat reflected in her Epworth sleepiness score which she endorsed at a high 15 point count. She also feels fatigued, heavy and slow. " I lack all energy".   Sleep medical history and family sleep history: her son has sleep apnea. Mother was a loud snorer.  Cervical fusion anterior access. No history of ENT surgery.  Social history:  Caffeine user - 5 to 8 beverages a day! Non-Tobacco user , seldom ETOH use.    Dr. Gladstone Lighter notes from [phone] year old right-handed female with hypertension, diabetes, depression, anxiety, here for evaluation of headaches.  Patient has had bilateral occipital headaches radiating to the bilateral parietal and frontal regions for the past 4 years. Patient has had daily headaches since that time. Headaches associated with throbbing and pressure sensation, photophobia, dizziness and blurred vision. No nausea vomiting. Twice a week headaches are more severe in intensity patient  has to go lay down a dark room. No specific triggering factors. Patient has family history of migraine in her mother and sister. Patient has history of C4-C7 cervical spine surgery. Patient also has snoring, hypertension, obesity, excessive daytime sleepiness. She's never had a sleep study.   REVIEW OF SYSTEMS: Full 14 system review of systems performed and negative with exception of: Fatigue / swelling in legs / snoring/ restless legs /headache/ dizziness /passing out/ anxiety, depression cramps constipation  racing thoughts not asleep.  Diabetes  ( sweet tea ) and HTN ,  How likely are you to doze in the following situations: 0 = not likely, 1 = slight chance, 2 = moderate chance, 3 = high chance  Sitting and Reading? 3Watching Television?3 Sitting inactive in a public place (theater or meeting)?2 Lying down in the afternoon when circumstances permit?3 Sitting and talking to someone?0 Sitting quietly after lunch without alcohol?2 In a car, while stopped for a few minutes in traffic?0 As a passenger in a car for an hour without a break?2  Total = 15  FSS 40 points, PHQ9     Social History   Social History  . Marital Status: Divorced    Spouse Name: N/A  . Number of Children: N/A  . Years of Education: N/A   Occupational History  . Not on file.   Social History Main Topics  . Smoking status: Never Smoker   . Smokeless tobacco: Never Used  . Alcohol Use: No  . Drug Use: No  . Sexual Activity: Not on file   Other Topics Concern  . Not on file   Social History Narrative    Family History  Problem Relation Age of Onset  . Cancer Mother   . Diabetes Daughter   . Diabetes Maternal Grandmother   . Heart attack Neg Hx   . Hypertension Mother   . Hypertension Maternal Grandmother   . Stroke Neg Hx     Past Medical History  Diagnosis Date  . Diabetes mellitus   . Hypertension   . Depression   . Anxiety   . Pneumonia   . Headache     "2 a month"  . Neuropathy ([place])   . Arthritis     Past Surgical History  Procedure Laterality Date  . Abdominal hysterectomy    . Carpal tunnel release    . Ulner nerve     . Tonsillectomy    . Neck surgery    . Knee surgery Left   . Knee arthroscopy Right     Current Outpatient Prescriptions  Medication Sig Dispense Refill  . acetaminophen-codeine (TYLENOL #3) 300-30 MG tablet Take 1 tablet by mouth every 8 (eight) hours as needed for moderate pain. 60 tablet 2  . albuterol (PROVENTIL HFA;VENTOLIN HFA) 108 (90 BASE)  MCG/ACT inhaler Inhale 2 puffs into the lungs every 6 (six) hours as needed for wheezing or shortness of breath. 1 Inhaler 2  . benazepril (LOTENSIN) 10 MG tablet Take 1 tablet (10 mg total) by mouth daily. 30 tablet 11  . cyclobenzaprine (FLEXERIL) 10 MG tablet Take 1 tablet (10 mg total) by mouth 3 (three) times daily as needed for muscle spasms. 30 tablet 0  . exenatide (BYETTA 5 MCG PEN) 5 MCG/0.02ML SOPN injection Inject 0.02 mLs (5 mcg total) into the skin 2 (two) times daily with a meal. 1.2 mL 3  . gabapentin (NEURONTIN) 300 MG capsule TAKE 3 CAPSULES BY MOUTH 3 TIMES DAILY. 270 capsule 2  . glucose  monitoring kit (FREESTYLE) monitoring kit 1 each by Does not apply route as needed for other. Dispense any model that is covered- dispense testing supplies for Q AC/ HS accuchecks- 1 month supply with one refil. 1 each 1  . Insulin Pen Needle (B-D ULTRAFINE III SHORT PEN) 31G X 8 MM MISC 1 application by Does not apply route 2 (two) times daily. 100 each 3  . metFORMIN (GLUCOPHAGE) 1000 MG tablet TAKE 1 TABLET BY MOUTH 2 TIMES DAILY WITH A MEAL. 60 tablet 3  . Multiple Vitamins-Minerals (WOMENS 50+ MULTI VITAMIN/MIN PO) Take 1 tablet by mouth daily.    . naproxen (NAPROSYN) 500 MG tablet Take 1 tablet (500 mg total) by mouth 2 (two) times daily with a meal. 30 tablet 0  . rizatriptan (MAXALT-MLT) 10 MG disintegrating tablet Take 1 tablet (10 mg total) by mouth as needed for migraine. May repeat in 2 hours if needed 9 tablet 11  . topiramate (TOPAMAX) 50 MG tablet Take 1 tablet (50 mg total) by mouth 2 (two) times daily. 60 tablet 12  . traZODone (DESYREL) 150 MG tablet Take 150 mg by mouth at bedtime.     Current Facility-Administered Medications  Medication Dose Route Frequency Provider Last Rate Last Dose  . triamcinolone acetonide (KENALOG) 10 MG/ML injection 10 mg  10 mg Other Once Landis Martins, DPM        Allergies as of 10/19/2015 - Review Complete 10/19/2015  Allergen Reaction Noted    . Shrimp [shellfish allergy] Anaphylaxis and Hives 06/28/2012    Vitals: BP 160/102 mmHg  Pulse 76  Resp 20  Ht 5' 6.5" (1.689 m)  Wt 233 lb (105.688 kg)  BMI 37.05 kg/m2 Last Weight:  Wt Readings from Last 1 Encounters:  10/19/15 233 lb (105.688 kg)   XYI:AXKP mass index is 37.05 kg/(m^2).     Last Height:   Ht Readings from Last 1 Encounters:  10/19/15 5' 6.5" (1.689 m)    Physical exam:  General: The patient is awake, alert and appears not in acute distress. The patient is well groomed. Head: Normocephalic, atraumatic. Neck is supple. Mallampati 3   neck circumference: 15.25 . Nasal airflow restricted , TMJ  click evident . Retrognathia is not seen. No biological teeth, all dentures.  Cardiovascular:  Regular rate and rhythm without  murmurs or carotid bruit, and without distended neck veins. Respiratory: Lungs are clear to auscultation. Skin:  Without evidence of edema, or rash Trunk: BMI is elevated . The patient's posture is slumped. .    Neurologic exam : The patient is awake and alert, oriented to place and time.   Memory subjective described as intact.   Attention span & concentration ability appears normal.  Speech is fluent,  without dysarthria, dysphonia or aphasia.  Mood and affect are appropriate.  Cranial nerves: Pupils are equal and briskly reactive to light. Funduscopic exam without  evidence of pallor or edema. Extraocular movements  in vertical and horizontal planes intact and without nystagmus. Visual fields by finger perimetry are intact. Hearing to finger rub intact.   Facial sensation intact to fine touch.  Facial motor strength is symmetric and tongue and uvula move midline. Shoulder shrug was symmetrical.   Motor exam:  Normal tone, muscle bulk and symmetric strength in all extremities.  Sensory:  Fine touch, pinprick and vibration were reduced in lower extremities.  Proprioception tested in the upper extremities was normal.   The patient was  advised of the nature of the diagnosed sleep  disorder , the treatment options and risks for general a health and wellness arising from not treating the condition.  I spent more than 40 minutes of face to face time with the patient. Greater than 50% of time was spent in counseling and coordination of care. We have discussed the diagnosis and differential and I answered the patient's questions.     Assessment:  After physical and neurologic examination, review of laboratory studies,  Personal review of imaging studies, reports of other /same  Imaging studies ,  Results of polysomnography/ neurophysiology testing and pre-existing records as far as provided in visit., my assessment is   1) the patient is a crowded upper airway with lateral pillars, a higher tongue ground, and a peaked palate roof. she is obese and has a large neck circumference. These anatomical features are associated with a higher risk of obstructive sleep apnea.  2) Briana Alvarez family has noticed that she snores loudly, this further is a risk indicator that she also has obstructive sleep apnea.  3) Briana Alvarez excessive daytime sleepiness and fatigue are her own chief problem and indeed her headache frequency over the last 3 or 4 years could be associated with hypercapnia, hypoxia, and caffeine use.    Plan:  Treatment plan and additional workup :  Split night with Co2 measures,  Sleep hygiene is already good, but caffeine use needs to be curbed.  This will help diabetes and BMI,as well as sleep.  Insomnia was years ago defined as depression related.     Briana Partridge Altovise Wahler MD  10/19/2015   CC: Boykin Nearing, [address] [place], [place] [id number]

## 2015-10-20 ENCOUNTER — Telehealth: Payer: Self-pay | Admitting: Family Medicine

## 2015-10-20 DIAGNOSIS — G4700 Insomnia, unspecified: Secondary | ICD-10-CM

## 2015-10-20 MED ORDER — TRAZODONE HCL 150 MG PO TABS: 150.0000 mg | ORAL_TABLET | Freq: Every day | ORAL | Status: DC

## 2015-10-20 NOTE — Telephone Encounter (Signed)
Trazodone refilled Please inform patient

## 2015-10-20 NOTE — Telephone Encounter (Signed)
Patient would like to know if she can get traZODone (DESYREL) 150 MG tablet [id number] prescribed by her PCP here at our clinic.Marland KitchenMarland KitchenMarland KitchenMarland Kitchenplease follow up

## 2015-10-21 NOTE — Telephone Encounter (Signed)
Pt notified Rx send to [place]

## 2015-10-24 ENCOUNTER — Ambulatory Visit
Admission: RE | Admit: 2015-10-24 | Discharge: 2015-10-24 | Disposition: A | Discharge status: Home / Self Care | Payer: Medicaid Other | Source: Ambulatory Visit | Attending: Diagnostic Neuroimaging | Admitting: Diagnostic Neuroimaging

## 2015-10-24 DIAGNOSIS — H538 Other visual disturbances: Secondary | ICD-10-CM

## 2015-10-24 DIAGNOSIS — R42 Dizziness and giddiness: Secondary | ICD-10-CM | POA: Diagnosis not present

## 2015-10-24 DIAGNOSIS — G43001 Migraine without aura, not intractable, with status migrainosus: Secondary | ICD-10-CM | POA: Diagnosis not present

## 2015-10-26 ENCOUNTER — Ambulatory Visit (INDEPENDENT_AMBULATORY_CARE_PROVIDER_SITE_OTHER): Payer: Medicaid Other | Admitting: Sports Medicine

## 2015-10-26 ENCOUNTER — Encounter: Payer: Self-pay | Admitting: Sports Medicine

## 2015-10-26 DIAGNOSIS — M19071 Primary osteoarthritis, right ankle and foot: Secondary | ICD-10-CM

## 2015-10-26 DIAGNOSIS — M25571 Pain in right ankle and joints of right foot: Secondary | ICD-10-CM

## 2015-10-26 DIAGNOSIS — M722 Plantar fascial fibromatosis: Secondary | ICD-10-CM

## 2015-10-26 DIAGNOSIS — E1140 Type 2 diabetes mellitus with diabetic neuropathy, unspecified: Secondary | ICD-10-CM

## 2015-10-26 DIAGNOSIS — M79672 Pain in left foot: Secondary | ICD-10-CM

## 2015-10-26 DIAGNOSIS — M79671 Pain in right foot: Secondary | ICD-10-CM

## 2015-10-26 MED ORDER — METHYLPREDNISOLONE 4 MG PO TBPK: ORAL_TABLET | ORAL | Status: DC

## 2015-10-26 MED ORDER — TRIAMCINOLONE ACETONIDE 10 MG/ML IJ SUSP: 10.0000 mg | Freq: Once | INTRAMUSCULAR | Status: DC

## 2015-10-26 MED ORDER — DICLOFENAC SODIUM 75 MG PO TBEC: 75.0000 mg | DELAYED_RELEASE_TABLET | Freq: Two times a day (BID) | ORAL | Status: DC

## 2015-10-26 MED FILL — METHYLPREDNISOLONE 4 MG TAB: 4 | 6 days supply | Qty: 21 | Fill #0 | Status: TO

## 2015-10-26 MED FILL — DICLOFENAC SOD 75 MG TAB EC: 75 | 15 days supply | Qty: 30 | Fill #0 | Status: TO

## 2015-10-26 NOTE — Progress Notes (Signed)
Patient ID: Briana Alvarez, female   DOB: 12/05/1960, 55 y.o.   MRN: [id number] Subjective: Briana Alvarez is a 55 y.o. Diabetic female returns to office for follow up evaluation after Left and Right heel injection for plantar fasciitis, injection #1 administered 4 weeks ago. Patient states that the injection seems to help her pain for about 2 weeks then started to recur. Patient denies any recent changes in medications or new problems since last visit. Reports that she tolerated the mobic fine with no problems. Reports that she has not been wearing a ankle brace, states that her heels hurt more than her right ankle.  FBS 103  Patient Active Problem List   Diagnosis Date Noted  . Morbid obesity ([place]) 08/27/2015  . Left low back pain 08/27/2015  . Diabetic peripheral neuropathy associated with type 2 diabetes mellitus ([place]) 04/10/2015  . Lumbar radiculopathy, chronic 02/18/2014  . Hypertriglyceridemia 01/08/2014  . Diabetes type 2, controlled ([place]) 07/12/2013  . Insomnia 07/12/2013  . Depression 07/12/2013  . Essential hypertension, benign 07/12/2013  . 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 Ankle sprain 11/30/2012  . Bipolar affective disorder ([place]) 10/12/2012  . BP (high blood pressure) 10/12/2012  . Diabetes mellitus ([place]) 10/12/2012    Current Outpatient Prescriptions on File Prior to Visit  Medication Sig Dispense Refill  . acetaminophen-codeine (TYLENOL #3) 300-30 MG tablet Take 1 tablet by mouth every 8 (eight) hours as needed for moderate pain. 60 tablet 2  . albuterol (PROVENTIL HFA;VENTOLIN HFA) 108 (90 BASE) MCG/ACT inhaler Inhale 2 puffs into the lungs every 6 (six) hours as needed for wheezing or shortness of breath. 1 Inhaler 2  . benazepril (LOTENSIN) 10 MG tablet Take 1 tablet (10 mg total) by mouth daily. 30 tablet 11  . cyclobenzaprine (FLEXERIL) 10 MG tablet Take 1 tablet (10 mg total) by mouth 3 (three) times daily as needed for muscle spasms. 30 tablet 0  . exenatide  (BYETTA 5 MCG PEN) 5 MCG/0.02ML SOPN injection Inject 0.02 mLs (5 mcg total) into the skin 2 (two) times daily with a meal. 1.2 mL 3  . gabapentin (NEURONTIN) 300 MG capsule TAKE 3 CAPSULES BY MOUTH 3 TIMES DAILY. 270 capsule 2  . glucose monitoring kit (FREESTYLE) monitoring kit 1 each by Does not apply route as needed for other. Dispense any model that is covered- dispense testing supplies for Q AC/ HS accuchecks- 1 month supply with one refil. 1 each 1  . Insulin Pen Needle (B-D ULTRAFINE III SHORT PEN) 31G X 8 MM MISC 1 application by Does not apply route 2 (two) times daily. 100 each 3  . metFORMIN (GLUCOPHAGE) 1000 MG tablet TAKE 1 TABLET BY MOUTH 2 TIMES DAILY WITH A MEAL. 60 tablet 3  . Multiple Vitamins-Minerals (WOMENS 50+ MULTI VITAMIN/MIN PO) Take 1 tablet by mouth daily.    . naproxen (NAPROSYN) 500 MG tablet Take 1 tablet (500 mg total) by mouth 2 (two) times daily with a meal. 30 tablet 0  . rizatriptan (MAXALT-MLT) 10 MG disintegrating tablet Take 1 tablet (10 mg total) by mouth as needed for migraine. May repeat in 2 hours if needed 9 tablet 11  . topiramate (TOPAMAX) 50 MG tablet Take 1 tablet (50 mg total) by mouth 2 (two) times daily. 60 tablet 12  . traZODone (DESYREL) 150 MG tablet Take 1 tablet (150 mg total) by mouth at bedtime. 30 tablet 5   Current Facility-Administered Medications on File Prior to Visit  Medication Dose Route Frequency Provider  Last Rate Last Dose  . triamcinolone acetonide (KENALOG) 10 MG/ML injection 10 mg  10 mg Other Once Landis Martins, DPM        Allergies  Allergen Reactions  . Shrimp [Shellfish Allergy] Anaphylaxis and Hives    Shrimp - hives    Objective:   General:  Alert and oriented x 3, in no acute distress  Dermatology: Skin is warm, dry, and supple bilateral. Nails are within normal limits. There is no lower extremity erythema, no eccymosis, no open lesions present bilateral.   Vascular: Dorsalis Pedis and Posterior Tibial pedal  pulses are 2/4 bilateral. + hair growth noted bilateral. Capillary Fill Time is 3 seconds in all digits. No varicosities, No edema bilateral lower extremities.   Neurological: Sensation grossly intact to light touch with an achilles reflex of +2 and a  negative Tinel's sign bilateral. Vibratory, sharp/dull, Semmes Weinstein Monofilament within normal limits.   Musculoskeletal: There is tenderness to palpation at the medial calcaneal tubercale and through the insertion of the plantar fascia bilateral. Mild tenderness to right lateral ankle and sinus tarsi with no instability. No pain with compression to calcaneus or application of tuning fork. There is decreased Ankle joint range of motion bilateral. All other joints range of motion  within normal limits bilateral. Strength 5/5 bilateral.   Assessment and Plan: Problem List Items Addressed This Visit    None    Visit Diagnoses    Plantar fasciitis, bilateral    -  Primary    Relevant Medications    methylPREDNISolone (MEDROL DOSEPAK) 4 MG TBPK tablet    diclofenac (VOLTAREN) 75 MG EC tablet    triamcinolone acetonide (KENALOG) 10 MG/ML injection 10 mg    Sinus tarsi syndrome of right ankle        Osteoarthritis of ankle and foot, right        Relevant Medications    methylPREDNISolone (MEDROL DOSEPAK) 4 MG TBPK tablet    diclofenac (VOLTAREN) 75 MG EC tablet    triamcinolone acetonide (KENALOG) 10 MG/ML injection 10 mg    Type 2 diabetes, controlled, with neuropathy ([facility])        Foot pain, bilateral          -Complete examination performed.  -Previous x-rays reviewed. -Discussed with patient in detail the condition of plantar fasciitis and sinus tarsi on right, how this  occurs related to the foot type of the patient and general treatment options. - Patient opted for another injection today; After oral consent and aseptic prep, injected a mixture containing 0.5 ml of 1%plain lidocaine, 0.5 ml 0.25% plain marcaine, 0.5 ml of kenalog 10  and 0.5 ml of dexmethasone phosphate to both heels at area of most pain/trigger point injection. -Rx Diclofenac to start after Medrol dose pack is completed -Applied plantar fascial taping bilateral to keep clean, dry, and intact as long as tolerated -Continue with stretching, icing, good supportive shoes, inserts daily. Encouraged patient to get OTC ankle support for Right. -Discussed long term care and reocurrence; will closely monitor; if fails to improve will consider other treatment modalities.  -Patient to return to office in 1 month for follow up or sooner if problems or questions arise.  Landis Martins, DPM

## 2015-10-26 NOTE — Patient Instructions (Signed)

## 2015-11-04 MED FILL — ULTICARE PEN NDL 4MM 32G: 32G X 4 MM | 30 days supply | Qty: 100 | Fill #1 | Status: TO

## 2015-11-04 MED FILL — traZODone HCL 150 MG TABS: 150 | 30 days supply | Qty: 30 | Fill #0 | Status: TO

## 2015-11-05 ENCOUNTER — Telehealth: Payer: Self-pay | Admitting: Family Medicine

## 2015-11-05 ENCOUNTER — Ambulatory Visit (INDEPENDENT_AMBULATORY_CARE_PROVIDER_SITE_OTHER): Payer: Medicaid Other | Admitting: Neurology

## 2015-11-05 DIAGNOSIS — R51 Headache: Principal | ICD-10-CM

## 2015-11-05 DIAGNOSIS — E662 Morbid (severe) obesity with alveolar hypoventilation: Secondary | ICD-10-CM

## 2015-11-05 DIAGNOSIS — G4730 Sleep apnea, unspecified: Secondary | ICD-10-CM | POA: Diagnosis not present

## 2015-11-05 DIAGNOSIS — R519 Headache, unspecified: Secondary | ICD-10-CM

## 2015-11-05 DIAGNOSIS — G4700 Insomnia, unspecified: Secondary | ICD-10-CM

## 2015-11-05 DIAGNOSIS — G4710 Hypersomnia, unspecified: Secondary | ICD-10-CM | POA: Diagnosis not present

## 2015-11-05 DIAGNOSIS — G44019 Episodic cluster headache, not intractable: Secondary | ICD-10-CM

## 2015-11-05 NOTE — Telephone Encounter (Signed)
Pt is calling regarding  Her Trazadone Rx  . Pt take the medication 2 tablets at night  Please, call her  Thank You

## 2015-11-09 MED ORDER — TRAZODONE HCL 150 MG PO TABS: 300.0000 mg | ORAL_TABLET | Freq: Every day | ORAL | Status: DC

## 2015-11-09 NOTE — Telephone Encounter (Signed)
Called patient. Verified name and DOB. Verified that she is taking 300 mg of trazodone total nightly. She prefers to take two 150 mg tablets. Refill sent to pharmacy.

## 2015-11-12 ENCOUNTER — Telehealth: Payer: Self-pay

## 2015-11-12 NOTE — Telephone Encounter (Signed)
I spoke to patient and she is aware of sleep study results. We made appt in July to discuss sleep study further. Copy of study was sent to PCP.

## 2015-11-23 ENCOUNTER — Ambulatory Visit (INDEPENDENT_AMBULATORY_CARE_PROVIDER_SITE_OTHER): Payer: Medicaid Other | Admitting: Sports Medicine

## 2015-11-23 ENCOUNTER — Encounter: Payer: Self-pay | Admitting: Sports Medicine

## 2015-11-23 DIAGNOSIS — M79671 Pain in right foot: Secondary | ICD-10-CM

## 2015-11-23 DIAGNOSIS — E1140 Type 2 diabetes mellitus with diabetic neuropathy, unspecified: Secondary | ICD-10-CM

## 2015-11-23 DIAGNOSIS — M79672 Pain in left foot: Secondary | ICD-10-CM

## 2015-11-23 DIAGNOSIS — M19071 Primary osteoarthritis, right ankle and foot: Secondary | ICD-10-CM

## 2015-11-23 DIAGNOSIS — M722 Plantar fascial fibromatosis: Secondary | ICD-10-CM | POA: Diagnosis not present

## 2015-11-23 MED ORDER — DICLOFENAC SODIUM 75 MG PO TBEC: 75.0000 mg | DELAYED_RELEASE_TABLET | Freq: Two times a day (BID) | ORAL | Status: DC

## 2015-11-23 MED ORDER — TRIAMCINOLONE ACETONIDE 10 MG/ML IJ SUSP: 10.0000 mg | Freq: Once | INTRAMUSCULAR | Status: DC

## 2015-11-23 NOTE — Progress Notes (Signed)
Patient ID: Briana Alvarez, female   DOB: 02-21-1961, 55 y.o.   MRN: [id number]  Subjective: Briana Alvarez is a 55 y.o. Diabetic female returns to office for follow up evaluation after Left and Right heel injection for plantar fasciitis, injection #2 administered 3 weeks ago. Patient states that pain is still present, no relief with injection, meds, or taping. Patient denies any recent changes in medications or new problems since last visit. Reports that she has not been wearing a ankle brace, states that her heels hurt more than her right ankle.  FBS 103, as previous  Patient Active Problem List   Diagnosis Date Noted  . Morbid obesity ([place]) 08/27/2015  . Left low back pain 08/27/2015  . Diabetic peripheral neuropathy associated with type 2 diabetes mellitus ([place]) 04/10/2015  . Lumbar radiculopathy, chronic 02/18/2014  . Hypertriglyceridemia 01/08/2014  . Diabetes type 2, controlled ([place]) 07/12/2013  . Insomnia 07/12/2013  . Depression 07/12/2013  . Essential hypertension, benign 07/12/2013  . Peripheral nerve disease ([place]) 01/16/2013  . Ankle sprain 11/30/2012  . Bipolar affective disorder ([place]) 10/12/2012  . BP (high blood pressure) 10/12/2012  . Diabetes mellitus ([place]) 10/12/2012    Current Outpatient Prescriptions on File Prior to Visit  Medication Sig Dispense Refill  . acetaminophen-codeine (TYLENOL #3) 300-30 MG tablet Take 1 tablet by mouth every 8 (eight) hours as needed for moderate pain. 60 tablet 2  . albuterol (PROVENTIL HFA;VENTOLIN HFA) 108 (90 BASE) MCG/ACT inhaler Inhale 2 puffs into the lungs every 6 (six) hours as needed for wheezing or shortness of breath. 1 Inhaler 2  . benazepril (LOTENSIN) 10 MG tablet Take 1 tablet (10 mg total) by mouth daily. 30 tablet 11  . cyclobenzaprine (FLEXERIL) 10 MG tablet Take 1 tablet (10 mg total) by mouth 3 (three) times daily as needed for muscle spasms. 30 tablet 0  . exenatide (BYETTA 5 MCG PEN) 5 MCG/0.02ML SOPN injection Inject  0.02 mLs (5 mcg total) into the skin 2 (two) times daily with a meal. 1.2 mL 3  . gabapentin (NEURONTIN) 300 MG capsule TAKE 3 CAPSULES BY MOUTH 3 TIMES DAILY. 270 capsule 2  . glucose monitoring kit (FREESTYLE) monitoring kit 1 each by Does not apply route as needed for other. Dispense any model that is covered- dispense testing supplies for Q AC/ HS accuchecks- 1 month supply with one refil. 1 each 1  . Insulin Pen Needle (B-D ULTRAFINE III SHORT PEN) 31G X 8 MM MISC 1 application by Does not apply route 2 (two) times daily. 100 each 3  . metFORMIN (GLUCOPHAGE) 1000 MG tablet TAKE 1 TABLET BY MOUTH 2 TIMES DAILY WITH A MEAL. 60 tablet 3  . methylPREDNISolone (MEDROL DOSEPAK) 4 MG TBPK tablet Take first as instructed 21 tablet 0  . Multiple Vitamins-Minerals (WOMENS 50+ MULTI VITAMIN/MIN PO) Take 1 tablet by mouth daily.    . naproxen (NAPROSYN) 500 MG tablet Take 1 tablet (500 mg total) by mouth 2 (two) times daily with a meal. 30 tablet 0  . rizatriptan (MAXALT-MLT) 10 MG disintegrating tablet Take 1 tablet (10 mg total) by mouth as needed for migraine. May repeat in 2 hours if needed 9 tablet 11  . topiramate (TOPAMAX) 50 MG tablet Take 1 tablet (50 mg total) by mouth 2 (two) times daily. 60 tablet 12  . traZODone (DESYREL) 150 MG tablet Take 2 tablets (300 mg total) by mouth at bedtime. 180 tablet 5   Current Facility-Administered Medications on File Prior to Visit  Medication Dose Route Frequency Provider Last Rate Last Dose  . triamcinolone acetonide (KENALOG) 10 MG/ML injection 10 mg  10 mg Other Once [employer], DPM      . triamcinolone acetonide (KENALOG) 10 MG/ML injection 10 mg  10 mg Other Once Landis Martins, DPM        Allergies  Allergen Reactions  . Shrimp [Shellfish Allergy] Anaphylaxis and Hives    Shrimp - hives    Objective:   General:  Alert and oriented x 3, in no acute distress  Dermatology: Skin is warm, dry, and supple bilateral. Nails are within normal  limits. There is no lower extremity erythema, no eccymosis, no open lesions present bilateral.   Vascular: Dorsalis Pedis and Posterior Tibial pedal pulses are 2/4 bilateral. + hair growth noted bilateral. Capillary Fill Time is 3 seconds in all digits. No varicosities, No edema bilateral lower extremities.   Neurological: Sensation grossly intact to light touch with an achilles reflex of +2 and a  negative Tinel's sign bilateral. Vibratory, sharp/dull, Semmes Weinstein Monofilament within normal limits.   Musculoskeletal: There is tenderness to palpation at the medial calcaneal tubercale and through the insertion of the plantar fascia bilateral Left>Right. Mild tenderness to right lateral ankle and sinus tarsi with no instability. No pain with compression to calcaneus or application of tuning fork. There is decreased Ankle joint range of motion bilateral. All other joints range of motion  within normal limits bilateral. Strength 5/5 bilateral.   Assessment and Plan: Problem List Items Addressed This Visit    None    Visit Diagnoses    Plantar fasciitis, bilateral    -  Primary    L>R    Relevant Medications    triamcinolone acetonide (KENALOG) 10 MG/ML injection 10 mg    diclofenac (VOLTAREN) 75 MG EC tablet    Osteoarthritis of ankle and foot, right        Relevant Medications    triamcinolone acetonide (KENALOG) 10 MG/ML injection 10 mg    diclofenac (VOLTAREN) 75 MG EC tablet    Type 2 diabetes, controlled, with neuropathy (HCC)        Foot pain, bilateral          -Complete examination performed.  -Previous x-rays reviewed. -Discussed with patient in detail the condition of plantar fasciitis and sinus tarsi on right, how this  occurs related to the foot type of the patient and general treatment options. - Patient opted for another injection today to left heel (#3); After oral consent and aseptic prep, injected a mixture containing 0.5 ml of 1%plain lidocaine, 0.5 ml 0.25% plain  marcaine, 0.5 ml of kenalog 10 and 0.5 ml of dexmethasone phosphate to Left heel at area of most pain/trigger point injection. -Refilled Diclofenac -Continue with stretching, icing, good supportive shoes, inserts daily. Encouraged patient to use OTC ankle support for Right. -Discussed long term care and reocurrence; will closely monitor; if fails to improve will consider other treatment modalities.  -Patient to return to office in 3 weeks for follow up or sooner if problems or questions arise. If no improvement recommend surgical consult for EPF left and injection on right during surgery.   Landis Martins, DPM

## 2015-12-04 ENCOUNTER — Other Ambulatory Visit: Payer: Self-pay | Admitting: Sports Medicine

## 2015-12-04 NOTE — Telephone Encounter (Signed)
If pt is continuing to have pain, she needs to be seen.

## 2015-12-13 ENCOUNTER — Other Ambulatory Visit: Payer: Self-pay | Admitting: Family Medicine

## 2015-12-14 ENCOUNTER — Ambulatory Visit: Payer: Medicaid Other | Admitting: Sports Medicine

## 2015-12-23 ENCOUNTER — Ambulatory Visit: Payer: Medicaid Other | Admitting: Diagnostic Neuroimaging

## 2015-12-24 ENCOUNTER — Encounter: Payer: Self-pay | Admitting: Diagnostic Neuroimaging

## 2015-12-29 DIAGNOSIS — M19019 Primary osteoarthritis, unspecified shoulder: Secondary | ICD-10-CM | POA: Insufficient documentation

## 2015-12-29 DIAGNOSIS — M722 Plantar fascial fibromatosis: Secondary | ICD-10-CM | POA: Insufficient documentation

## 2015-12-29 DIAGNOSIS — G43709 Chronic migraine without aura, not intractable, without status migrainosus: Secondary | ICD-10-CM | POA: Insufficient documentation

## 2015-12-29 DIAGNOSIS — M5442 Lumbago with sciatica, left side: Secondary | ICD-10-CM

## 2015-12-29 DIAGNOSIS — G4733 Obstructive sleep apnea (adult) (pediatric): Secondary | ICD-10-CM | POA: Insufficient documentation

## 2015-12-29 DIAGNOSIS — G8929 Other chronic pain: Secondary | ICD-10-CM | POA: Insufficient documentation

## 2016-01-04 ENCOUNTER — Ambulatory Visit: Payer: Medicaid Other | Admitting: Sports Medicine

## 2016-01-06 ENCOUNTER — Other Ambulatory Visit: Payer: Self-pay | Admitting: Specialist

## 2016-01-06 DIAGNOSIS — M25511 Pain in right shoulder: Secondary | ICD-10-CM

## 2016-01-11 ENCOUNTER — Encounter: Payer: Self-pay | Admitting: Neurology

## 2016-01-11 ENCOUNTER — Ambulatory Visit (INDEPENDENT_AMBULATORY_CARE_PROVIDER_SITE_OTHER): Payer: Medicaid Other | Admitting: Neurology

## 2016-01-11 VITALS — BP 166/99 | HR 70 | Ht 66.5 in | Wt 219.5 lb

## 2016-01-11 DIAGNOSIS — G4710 Hypersomnia, unspecified: Secondary | ICD-10-CM | POA: Insufficient documentation

## 2016-01-11 DIAGNOSIS — G4719 Other hypersomnia: Secondary | ICD-10-CM | POA: Diagnosis not present

## 2016-01-11 NOTE — Patient Instructions (Signed)
Hypersomnia Hypersomnia is when you feel extremely tired during the day even though you're getting plenty of sleep at night. You may need to take naps during the day, and you may also be extremely difficult to wake up when you are sleeping.  CAUSES  The cause of your hypersomnia may not be known. Hypersomnia may be caused by:   Medicines.  Sleep disorders, such as narcolepsy.  Trauma or injury to your head or nervous system.  Using drugs or alcohol.  Tumors.  Medical conditions, such as depression or hypothyroidism.  Genetics. SIGNS AND SYMPTOMS  The main symptoms of hypersomnia include:   Feeling extremely tired throughout the day.  Being very difficult to wake up.  Sleeping for longer and longer periods.  Taking naps throughout the day. Other symptoms may include:   Feeling:  Restless.  Annoyed.  Anxious.  Low energy.  Having difficulty:  Remembering.  Speaking.  Thinking.  Losing your appetite.  Experiencing hallucinations. DIAGNOSIS  Hypersomnia may be diagnosed by:  Medical history and physical exam. This will include a sleep history.  Completing sleep logs.  Tests may also be done, such as:  Polysomnography.  Multiple sleep latency test (MSLT). TREATMENT  There is no cure for hypersomnia, but treatment can be very effective in helping manage the condition. Treatment may include:  Lifestyle and sleeping strategies to help cope with the condition.  Stimulant medicines.  Treating any underlying causes of hypersomnia. HOME CARE INSTRUCTIONS  Take medicines only as directed by your health care provider.  Schedule short naps for when you feel sleepiest during the day. Tell your employer or teachers that you have hypersomnia. You may be able to adjust your schedule to include time for naps.  Avoid drinking alcohol or caffeinated beverages.  Do not eat a heavy meal before bedtime. Eat at about the same times every day.  Do not drive or  operate heavy machinery if you are sleepy.  Do not swim or go out on the water without a life jacket.  If possible, adjust your schedule so that you do not have to work or be active at night.  Keep all follow-up visits as directed by your health care provider. This is important. SEEK MEDICAL CARE IF:   You have new symptoms.  Your symptoms get worse. SEEK IMMEDIATE MEDICAL CARE IF:  You have serious thoughts of hurting yourself or someone else.   This information is not intended to replace advice given to you by your health care provider. Make sure you discuss any questions you have with your health care provider.   Document Released: 05/27/2002 Document Revised: 06/27/2014 Document Reviewed: 01/09/2014 Elsevier Interactive Patient Education 2016 Elsevier Inc[employer]  

## 2016-01-11 NOTE — Progress Notes (Signed)
SLEEP MEDICINE CLINIC   Provider:  Larey Seat, M D  Referring Provider: Boykin Nearing, MD Primary Care Physician:  Vicenta Aly, FNP  Chief Complaint  Patient presents with  . Sleep Related Headaches/Fatigue    She is here to discuss the results of her sleep study.    HPI:  Briana Alvarez is a 55 y.o. female , seen here as a referral from Dr. Andrey Spearman for a sleep evaluation.   Chief complaint according to patient : The patient reports having daily headaches for the last 3-4 years, but was not able to address these medically as she lacks insurance. She now was able to sign up for Medicaid. Her last medical attention prior was in regards to a neck surgery in 2013 with  fusion of the cervical vertebrae 4 through 7, at [facility]Briana Alvarez also reports that she feels sleepy all the time, she does not schedule naps but frequently falls asleep inadvertently. Whenever she is at rest and not mentally stimulated or physically active she is is at risk of dozing off. This can be in front of the computer,  reading a book, while watching TV.   Sleep habits are as follows: The patient has an exact bedtime of 9 PM, and describes her bedroom is cool, quiet and dark. She sleeps alone, but shares a house with her daughter. She sleeps on one pillow, and prefers a lateral sleep position. Her daughter and her grandson have told her that she snores loudly, breezes irregularly. The patient feels that she's a deep sleeper and sound sleeper and denies waking up with gasping for air, shortness of air, chest pain palpitations or diaphoresis. She wakes up usually between 2:30 AM and 3 AM to go to the bathroom once but otherwise sleeps through the night. She was placed on trazodone to help her sleep and this has worked well for her. Trazodone has been prescribed since 2011. She spontaneously wakes up at 5:30 AM ready to start her day, she does not rely on an alarm. She usually is ready  to go by that time. She is getting her grandson ready for school and drives him to school. She states that she drinks 4-5 glasses of sweet tea from the morning on, maybe 1 or 2 cans of caffeinated soda, seldom does she drink coffee. After she returns from her school rub in the morning she would like to go to bed again. By lunchtime she watches TV and very often falls asleep. She fights sleeping but sometimes it is an irresistible urge to go and have a nap. This is somewhat reflected in her Epworth sleepiness score which she endorsed at a high 15 point count. She also feels fatigued, heavy and slow. " I lack all energy".  Interval history from 01/11/2016 Briana Alvarez again endorsed today the Epworth sleepiness score at an elevated level of 15 points fatigue severity at 31 points, but her sleep study from May 18 did not give any answers as to why. The patient slept for a total time of 465 minutes with very sustained sleep pattern. She had no significant apnea, desaturation of oxygen, periodic limb movements and no cardiac abnormalities. I do wonder if her headaches may be related to caffeine overuse. She stated that she has stopped drinking coffee in the meantime I also told her that we found evidence of snoring. However CPAP could not be used to treat snoring alone. Weight loss, positional changes and sometimes dental devices can be applied but  this patient already uses a full set of dentures which was makes the mandibular advancement impossible. It leaves her with the advice to avoid the supine sleep position and to try to lose weight. Tolerating upper airway muscles can also be achieved by singing or playing a musical woodwind instrument.    Sleep medical history and family sleep history: her son has sleep apnea. Mother was a loud snorer.  Cervical fusion anterior access. No history of ENT surgery.  Social history:  Caffeine user - 5 to 8 beverages a day! Non-Tobacco user , seldom ETOH use.    Dr.  Gladstone Lighter notes from [phone] year old right-handed female with hypertension, diabetes, depression, anxiety, here for evaluation of headaches. Patient has had bilateral occipital headaches radiating to the bilateral parietal and frontal regions for the past 4 years. Patient has had daily headaches since that time. Headaches associated with throbbing and pressure sensation, photophobia, dizziness and blurred vision. No nausea vomiting. Twice a week headaches are more severe in intensity patient has to go lay down a dark room. No specific triggering factors. Patient has family history of migraine in her mother and sister. Patient has history of C4-C7 cervical spine surgery. Patient also has snoring, hypertension, obesity, excessive daytime sleepiness. She's never had a sleep study.   REVIEW OF SYSTEMS: Full 14 system review of systems performed and negative with exception of: Fatigue / swelling in legs / snoring/ restless legs /headache/ dizziness /passing out/ anxiety, depression cramps constipation racing thoughts not asleep.  Diabetes  ( sweet tea ) and HTN ,  How likely are you to doze in the following situations: 0 = not likely, 1 = slight chance, 2 = moderate chance, 3 = high chance  Sitting and Reading? 3Watching Television?3 Sitting inactive in a public place (theater or meeting)?2 Lying down in the afternoon when circumstances permit?3 Sitting and talking to someone?0 Sitting quietly after lunch without alcohol?2 In a car, while stopped for a few minutes in traffic?0 As a passenger in a car for an hour without a break?2  Total = 15  FSS 40 points, PHQ9     Social History   Social History  . Marital status: Divorced    Spouse name: N/A  . Number of children: N/A  . Years of education: N/A   Occupational History  . Not on file.   Social History Main Topics  . Smoking status: Never Smoker  . Smokeless tobacco: Never Used  . Alcohol use No  . Drug use: No  . Sexual  activity: Not on file   Other Topics Concern  . Not on file   Social History Narrative  . No narrative on file    Family History  Problem Relation Age of Onset  . Cancer Mother   . Diabetes Daughter   . Diabetes Maternal Grandmother   . Heart attack Neg Hx   . Hypertension Mother   . Hypertension Maternal Grandmother   . Stroke Neg Hx     Past Medical History:  Diagnosis Date  . Anxiety   . Arthritis   . Depression   . Diabetes mellitus   . Headache    "2 a month"  . Hypertension   . Neuropathy ([place])   . Pneumonia     Past Surgical History:  Procedure Laterality Date  . ABDOMINAL HYSTERECTOMY    . CARPAL TUNNEL RELEASE    . KNEE ARTHROSCOPY Right   . KNEE SURGERY Left   . NECK SURGERY    .  TONSILLECTOMY    . ulner nerve       Current Outpatient Prescriptions  Medication Sig Dispense Refill  . acetaminophen-codeine (TYLENOL #3) 300-30 MG tablet Take 1 tablet by mouth every 8 (eight) hours as needed for moderate pain. 60 tablet 2  . albuterol (PROVENTIL HFA;VENTOLIN HFA) 108 (90 BASE) MCG/ACT inhaler Inhale 2 puffs into the lungs every 6 (six) hours as needed for wheezing or shortness of breath. 1 Inhaler 2  . benazepril (LOTENSIN) 10 MG tablet Take 1 tablet (10 mg total) by mouth daily. 30 tablet 11  . exenatide (BYETTA 5 MCG PEN) 5 MCG/0.02ML SOPN injection Inject 0.02 mLs (5 mcg total) into the skin 2 (two) times daily with a meal. 1.2 mL 3  . gabapentin (NEURONTIN) 300 MG capsule TAKE 3 CAPSULES BY MOUTH 3 TIMES A DAY 270 capsule 0  . glucose monitoring kit (FREESTYLE) monitoring kit 1 each by Does not apply route as needed for other. Dispense any model that is covered- dispense testing supplies for Q AC/ HS accuchecks- 1 month supply with one refil. 1 each 1  . Insulin Pen Needle (B-D ULTRAFINE III SHORT PEN) 31G X 8 MM MISC 1 application by Does not apply route 2 (two) times daily. 100 each 3  . metFORMIN (GLUCOPHAGE) 1000 MG tablet TAKE 1 TABLET BY MOUTH 2  TIMES DAILY WITH A MEAL. 60 tablet 3  . Multiple Vitamins-Minerals (WOMENS 50+ MULTI VITAMIN/MIN PO) Take 1 tablet by mouth daily.    . rizatriptan (MAXALT-MLT) 10 MG disintegrating tablet Take 1 tablet (10 mg total) by mouth as needed for migraine. May repeat in 2 hours if needed 9 tablet 11  . topiramate (TOPAMAX) 50 MG tablet Take 1 tablet (50 mg total) by mouth 2 (two) times daily. 60 tablet 12  . traZODone (DESYREL) 150 MG tablet Take 2 tablets (300 mg total) by mouth at bedtime. 180 tablet 5   Current Facility-Administered Medications  Medication Dose Route Frequency Provider Last Rate Last Dose  . triamcinolone acetonide (KENALOG) 10 MG/ML injection 10 mg  10 mg Other Once [employer], DPM      . triamcinolone acetonide (KENALOG) 10 MG/ML injection 10 mg  10 mg Other Once [employer], DPM      . triamcinolone acetonide (KENALOG) 10 MG/ML injection 10 mg  10 mg Other Once Landis Martins, DPM        Allergies as of 01/11/2016 - Review Complete 01/11/2016  Allergen Reaction Noted  . Shrimp [shellfish allergy] Anaphylaxis and Hives 06/28/2012    Vitals: BP (!) 166/99   Pulse 70   Ht 5' 6.5" (1.689 m)   Wt 219 lb 8 oz (99.6 kg)   BMI 34.90 kg/m  Last Weight:  Wt Readings from Last 1 Encounters:  01/11/16 219 lb 8 oz (99.6 kg)   OTL:XBWI mass index is 34.9 kg/m.     Last Height:   Ht Readings from Last 1 Encounters:  01/11/16 5' 6.5" (1.689 m)    Physical exam:  General: The patient is awake, alert and appears not in acute distress. The patient is well groomed. Head: Normocephalic, atraumatic. Neck is supple. Mallampati 3   neck circumference: 15.25 . Nasal airflow restricted , TMJ  click evident . Retrognathia is not seen. No biological teeth, all dentures.  Cardiovascular:  Regular rate and rhythm without  murmurs or carotid bruit, and without distended neck veins. Respiratory: Lungs are clear to auscultation. Skin:  Without evidence of edema, or rash Trunk: BMI  is  elevated . The patient's posture is slumped. .    Neurologic exam : The patient is awake and alert, oriented to place and time.   Memory subjective described as intact.   Attention span & concentration ability appears normal.  Speech is fluent,  without dysarthria, dysphonia or aphasia.  Mood and affect are appropriate.  Cranial nerves: Pupils are equal and briskly reactive to light. Funduscopic exam without  evidence of pallor or edema. Extraocular movements  in vertical and horizontal planes intact and without nystagmus. Visual fields by finger perimetry are intact. Hearing to finger rub intact.   Facial sensation intact to fine touch.  Facial motor strength is symmetric and tongue and uvula move midline. Shoulder shrug was symmetrical.   Motor exam:  Normal tone, muscle bulk and symmetric strength in all extremities.  Sensory:  Fine touch, pinprick and vibration were reduced in lower extremities.  Proprioception tested in the upper extremities was normal.   The patient was advised of the nature of the diagnosed sleep disorder , the treatment options and risks for general a health and wellness arising from not treating the condition.  I spent more than 40 minutes of face to face time with the patient. Greater than 50% of time was spent in counseling and coordination of care. We have discussed the diagnosis and differential and I answered the patient's questions.     Assessment:  After physical and neurologic examination, review of laboratory studies,  Personal review of imaging studies, reports of other /same  Imaging studies ,  Results of polysomnography/ neurophysiology testing and pre-existing records as far as provided in visit., my assessment is   1) the patient is a crowded upper airway with lateral pillars, a higher tongue ground, and a peaked palate roof. she is obese and has a large neck circumference. These anatomical features are associated with a higher risk of obstructive  sleep apnea.  2) Mrs. Newsham family has noticed that she snores loudly,and this was confirmed in her sleep study, There was no hypoxemia, PLMs or significant AHI noted.  To treat snoring unassociated with apnea I recommend weight loss #1  #2 sleeping in a nonsupine position on her side or on her belly and  #3  A dental device for mandibular advancement could usually be applied. Mrs. Buchbinder stated that she uses a full set of dentures    However there is no reason to use CPAP for the snoring treatment alone.     3) Mrs. Buckbee excessive daytime sleepiness and fatigue are her own chief problem and indeed her headache frequency over the last 3 or 4 years could be associated with caffeine use.    Plan:  Return to Dr Leta Baptist.      Asencion Partridge Caralina Nop MD  01/11/2016   CC: Boykin Nearing, [address] [place], [place] [address]

## 2016-01-13 ENCOUNTER — Ambulatory Visit
Admission: RE | Admit: 2016-01-13 | Discharge: 2016-01-13 | Disposition: A | Discharge status: Home / Self Care | Payer: Medicaid Other | Source: Ambulatory Visit | Attending: Specialist | Admitting: Specialist

## 2016-01-13 DIAGNOSIS — M25511 Pain in right shoulder: Secondary | ICD-10-CM

## 2016-01-19 ENCOUNTER — Ambulatory Visit: Payer: Medicaid Other | Admitting: Sports Medicine

## 2016-01-22 ENCOUNTER — Other Ambulatory Visit: Payer: Self-pay | Admitting: Orthopedic Surgery

## 2016-02-04 ENCOUNTER — Encounter (HOSPITAL_COMMUNITY)
Admission: RE | Admit: 2016-02-04 | Discharge: 2016-02-04 | Disposition: A | Discharge status: Home / Self Care | Payer: Medicaid Other | Source: Ambulatory Visit | Attending: Orthopedic Surgery | Admitting: Orthopedic Surgery

## 2016-02-04 ENCOUNTER — Encounter (HOSPITAL_COMMUNITY): Payer: Self-pay

## 2016-02-04 DIAGNOSIS — Z01812 Encounter for preprocedural laboratory examination: Secondary | ICD-10-CM | POA: Diagnosis not present

## 2016-02-04 DIAGNOSIS — I10 Essential (primary) hypertension: Secondary | ICD-10-CM | POA: Insufficient documentation

## 2016-02-04 DIAGNOSIS — M19011 Primary osteoarthritis, right shoulder: Secondary | ICD-10-CM | POA: Insufficient documentation

## 2016-02-04 DIAGNOSIS — I491 Atrial premature depolarization: Secondary | ICD-10-CM | POA: Insufficient documentation

## 2016-02-04 DIAGNOSIS — M7551 Bursitis of right shoulder: Secondary | ICD-10-CM | POA: Diagnosis not present

## 2016-02-04 DIAGNOSIS — Z01818 Encounter for other preprocedural examination: Secondary | ICD-10-CM | POA: Diagnosis present

## 2016-02-04 LAB — CBC
HEMATOCRIT: 41.2 % (ref 36.0–46.0)
HEMOGLOBIN: 13.1 g/dL (ref 12.0–15.0)
MCH: 33 pg (ref 26.0–34.0)
MCHC: 31.8 g/dL (ref 30.0–36.0)
MCV: 103.8 fL — AB (ref 78.0–100.0)
Platelets: 154 10*3/uL (ref 150–400)
RBC: 3.97 MIL/uL (ref 3.87–5.11)
RDW: 14.1 % (ref 11.5–15.5)
WBC: 6.8 10*3/uL (ref 4.0–10.5)

## 2016-02-04 LAB — BASIC METABOLIC PANEL
ANION GAP: 8 (ref 5–15)
BUN: 8 mg/dL (ref 6–20)
CALCIUM: 9.7 mg/dL (ref 8.9–10.3)
CHLORIDE: 106 mmol/L (ref 101–111)
CO2: 24 mmol/L (ref 22–32)
Creatinine, Ser: 0.98 mg/dL (ref 0.44–1.00)
GFR calc non Af Amer: >60 mL/min (ref >60)
GLUCOSE: 115 mg/dL — AB (ref 65–99)
Potassium: 4.1 mmol/L (ref 3.5–5.1)
Sodium: 138 mmol/L (ref 135–145)

## 2016-02-04 LAB — GLUCOSE, CAPILLARY: Glucose-Capillary: 120 mg/dL — ABNORMAL HIGH (ref 65–99)

## 2016-02-04 MED ORDER — CHLORHEXIDINE GLUCONATE 4 % EX LIQD: 60.0000 mL | Freq: Once | CUTANEOUS | Status: DC

## 2016-02-04 NOTE — Pre-Procedure Instructions (Signed)
Genoa Jeske  02/04/2016      [id number] [place], [place] [id number] [place] [id number] Phone: [phone] Fax: [phone]    Your procedure is scheduled on Mon, Aug 28 @ 12:30 PM  Report to [facility] Admitting at 10:30 AM  Call this number if you have problems the morning of surgery:  [phone]   Remember:  Do not eat food or drink liquids after midnight.  Take these medicines the morning of surgery with A SIP OF WATER Albuterol<Bring Your Inhaler With You> and Gabapentin(Neurontin)                  How to Manage Your Diabetes Before and After Surgery  Why is it important to control my blood sugar before and after surgery? . Improving blood sugar levels before and after surgery helps healing and can limit problems. . A way of improving blood sugar control is eating a healthy diet by: o  Eating less sugar and carbohydrates o  Increasing activity/exercise o  Talking with your doctor about reaching your blood sugar goals . High blood sugars (greater than 180 mg/dL) can raise your risk of infections and slow your recovery, so you will need to focus on controlling your diabetes during the weeks before surgery. . Make sure that the doctor who takes care of your diabetes knows about your planned surgery including the date and location.  How do I manage my blood sugar before surgery? . Check your blood sugar at least 4 times a day, starting 2 days before surgery, to make sure that the level is not too high or low. o Check your blood sugar the morning of your surgery when you wake up and every 2 hours until you get to the Short Stay unit. . If your blood sugar is less than 70 mg/dL, you will need to treat for low blood sugar: o Do not take insulin. o Treat a low blood sugar (less than 70 mg/dL) with  cup of clear juice (cranberry or apple), 4 glucose tablets, OR glucose  gel. o Recheck blood sugar in 15 minutes after treatment (to make sure it is greater than 70 mg/dL). If your blood sugar is not greater than 70 mg/dL on recheck, call [phone] for further instructions. . Report your blood sugar to the short stay nurse when you get to Short Stay.  . If you are admitted to the hospital after surgery: o Your blood sugar will be checked by the staff and you will probably be given insulin after surgery (instead of oral diabetes medicines) to make sure you have good blood sugar levels. o The goal for blood sugar control after surgery is 80-180 mg/dL.              WHAT DO I DO ABOUT MY DIABETES MEDICATION?   Marland Kitchen Do not take oral diabetes medicines (pills) the morning of surgery.        . The day of surgery, do not take other diabetes injectables, including Byetta (exenatide), Bydureon (exenatide ER), Victoza (liraglutide), or Trulicity (dulaglutide).  . If your CBG is greater than 220 mg/dL, you may take  of your sliding scale (correction) dose of insulin.  Other Instructions:          Patient Signature:  Date:   Nurse Signature:  Date:   Reviewed and Endorsed by [facility] Patient Education Committee, August 2015  Do not wear jewelry, make-up or nail polish.  Do not wear lotions, powders, or perfumes.    Do not shave 48 hours prior to surgery.    Do not bring valuables to the hospital.  [facility] is not responsible for any belongings or valuables.  Contacts, dentures or bridgework may not be worn into surgery.  Leave your suitcase in the car.  After surgery it may be brought to your room.  For patients admitted to the hospital, discharge time will be determined by your treatment team.  Patients discharged the day of surgery will not be allowed to drive home.    [facility]  Before surgery, you can play an important role.  Because skin is not sterile, your skin needs to be as free of  germs as possible.  You can reduce the number of germs on you skin by washing with CHG (chlorahexidine gluconate) soap before surgery.  CHG is an antiseptic cleaner which kills germs and bonds with the skin to continue killing germs even after washing.  Please DO NOT use if you have an allergy to CHG or antibacterial soaps.  If your skin becomes reddened/irritated stop using the CHG and inform your nurse when you arrive at Short Stay.  Do not shave (including legs and underarms) for at least 48 hours prior to the first CHG shower.  You may shave your face.  Please follow these instructions carefully:   1.  Shower with CHG Soap the night before surgery and the                                morning of Surgery.  2.  If you choose to wash your hair, wash your hair first as usual with your       normal shampoo.  3.  After you shampoo, rinse your hair and body thoroughly to remove the                      Shampoo.  4.  Use CHG as you would any other liquid soap.  You can apply chg directly       to the skin and wash gently with scrungie or a clean washcloth.  5.  Apply the CHG Soap to your body ONLY FROM THE NECK DOWN.        Do not use on open wounds or open sores.  Avoid contact with your eyes,       ears, mouth and genitals (private parts).  Wash genitals (private parts)       with your normal soap.  6.  Wash thoroughly, paying special attention to the area where your surgery        will be performed.  7.  Thoroughly rinse your body with warm water from the neck down.  8.  DO NOT shower/wash with your normal soap after using and rinsing off       the CHG Soap.  9.  Pat yourself dry with a clean towel.            10.  Wear clean pajamas.            11.  Place clean sheets on your bed the night of your first shower and do not        sleep with pets.  Day of Surgery  Do not apply any lotions/deoderants the morning of  surgery.  Please wear clean clothes to the hospital/surgery center.      Please  read over the following fact sheets that you were given. Pain Booklet, Coughing and Deep Breathing and Surgical Site Infection Prevention

## 2016-02-05 LAB — HEMOGLOBIN A1C
Hgb A1c MFr Bld: 5.6 % (ref 4.8–5.6)
Mean Plasma Glucose: 114 mg/dL

## 2016-02-12 MED ORDER — CEFAZOLIN SODIUM-DEXTROSE 2-4 GM/100ML-% IV SOLN
2.0000 g | INTRAVENOUS | Status: AC
  Administered 2016-02-15: 2 g via INTRAVENOUS
  Filled 2016-02-12: qty 100

## 2016-02-15 ENCOUNTER — Encounter (HOSPITAL_COMMUNITY)
Admission: RE | Disposition: A | Discharge status: Home / Self Care | Payer: Self-pay | Source: Ambulatory Visit | Attending: Orthopedic Surgery

## 2016-02-15 ENCOUNTER — Ambulatory Visit (HOSPITAL_COMMUNITY): Payer: Medicaid Other | Admitting: Certified Registered"

## 2016-02-15 ENCOUNTER — Encounter: Payer: Self-pay | Admitting: Podiatry

## 2016-02-15 ENCOUNTER — Ambulatory Visit (INDEPENDENT_AMBULATORY_CARE_PROVIDER_SITE_OTHER): Payer: Medicaid Other | Admitting: Podiatry

## 2016-02-15 ENCOUNTER — Ambulatory Visit (HOSPITAL_COMMUNITY)
Admission: RE | Admit: 2016-02-15 | Discharge: 2016-02-15 | Disposition: A | Discharge status: Home / Self Care | Payer: Medicaid Other | Source: Ambulatory Visit | Attending: Orthopedic Surgery | Admitting: Orthopedic Surgery

## 2016-02-15 ENCOUNTER — Encounter (HOSPITAL_COMMUNITY): Payer: Self-pay | Admitting: *Deleted

## 2016-02-15 DIAGNOSIS — Z7984 Long term (current) use of oral hypoglycemic drugs: Secondary | ICD-10-CM | POA: Diagnosis not present

## 2016-02-15 DIAGNOSIS — M79672 Pain in left foot: Secondary | ICD-10-CM

## 2016-02-15 DIAGNOSIS — M722 Plantar fascial fibromatosis: Secondary | ICD-10-CM

## 2016-02-15 DIAGNOSIS — M19011 Primary osteoarthritis, right shoulder: Secondary | ICD-10-CM | POA: Insufficient documentation

## 2016-02-15 DIAGNOSIS — E1140 Type 2 diabetes mellitus with diabetic neuropathy, unspecified: Secondary | ICD-10-CM | POA: Insufficient documentation

## 2016-02-15 DIAGNOSIS — F329 Major depressive disorder, single episode, unspecified: Secondary | ICD-10-CM | POA: Insufficient documentation

## 2016-02-15 DIAGNOSIS — I10 Essential (primary) hypertension: Secondary | ICD-10-CM | POA: Diagnosis not present

## 2016-02-15 DIAGNOSIS — E119 Type 2 diabetes mellitus without complications: Secondary | ICD-10-CM | POA: Insufficient documentation

## 2016-02-15 DIAGNOSIS — M7551 Bursitis of right shoulder: Secondary | ICD-10-CM | POA: Insufficient documentation

## 2016-02-15 DIAGNOSIS — G629 Polyneuropathy, unspecified: Secondary | ICD-10-CM | POA: Diagnosis not present

## 2016-02-15 DIAGNOSIS — M79671 Pain in right foot: Secondary | ICD-10-CM

## 2016-02-15 DIAGNOSIS — F419 Anxiety disorder, unspecified: Secondary | ICD-10-CM | POA: Insufficient documentation

## 2016-02-15 HISTORY — PX: SHOULDER ARTHROSCOPY WITH SUBACROMIAL DECOMPRESSION AND BICEP TENDON REPAIR: SHX5689

## 2016-02-15 LAB — GLUCOSE, CAPILLARY
Glucose-Capillary: 135 mg/dL — ABNORMAL HIGH (ref 65–99)
Glucose-Capillary: 153 mg/dL — ABNORMAL HIGH (ref 65–99)
Glucose-Capillary: 172 mg/dL — ABNORMAL HIGH (ref 65–99)

## 2016-02-15 SURGERY — SHOULDER ARTHROSCOPY WITH SUBACROMIAL DECOMPRESSION AND BICEP TENDON REPAIR
Anesthesia: General | Site: Shoulder | Laterality: Right

## 2016-02-15 MED ORDER — ROCURONIUM BROMIDE 10 MG/ML (PF) SYRINGE
PREFILLED_SYRINGE | INTRAVENOUS | Status: AC
  Filled 2016-02-15: qty 10

## 2016-02-15 MED ORDER — FENTANYL CITRATE (PF) 100 MCG/2ML IJ SOLN
INTRAMUSCULAR | Status: AC
  Administered 2016-02-15: 50 ug via INTRAVENOUS
  Filled 2016-02-15: qty 2

## 2016-02-15 MED ORDER — MORPHINE SULFATE (PF) 4 MG/ML IV SOLN
INTRAVENOUS | Status: AC
  Filled 2016-02-15: qty 1

## 2016-02-15 MED ORDER — MIDAZOLAM HCL 2 MG/2ML IJ SOLN
INTRAMUSCULAR | Status: AC
  Filled 2016-02-15: qty 2

## 2016-02-15 MED ORDER — BUPIVACAINE-EPINEPHRINE (PF) 0.5% -1:200000 IJ SOLN
INTRAMUSCULAR | Status: DC | PRN
  Administered 2016-02-15: 20 mL via PERINEURAL

## 2016-02-15 MED ORDER — HYDROCODONE-ACETAMINOPHEN 7.5-325 MG PO TABS: 1.0000 | ORAL_TABLET | Freq: Once | ORAL | Status: DC | PRN

## 2016-02-15 MED ORDER — ONDANSETRON HCL 4 MG/2ML IJ SOLN
INTRAMUSCULAR | Status: DC | PRN
  Administered 2016-02-15: 4 mg via INTRAVENOUS

## 2016-02-15 MED ORDER — MIDAZOLAM HCL 2 MG/2ML IJ SOLN
1.0000 mg | Freq: Once | INTRAMUSCULAR | Status: AC
  Administered 2016-02-15: 1 mg via INTRAVENOUS

## 2016-02-15 MED ORDER — ROCURONIUM BROMIDE 100 MG/10ML IV SOLN
INTRAVENOUS | Status: DC | PRN
  Administered 2016-02-15: 50 mg via INTRAVENOUS

## 2016-02-15 MED ORDER — EPINEPHRINE HCL 1 MG/ML IJ SOLN
INTRAMUSCULAR | Status: DC | PRN
  Administered 2016-02-15: .8 mL via INTRAMUSCULAR
  Administered 2016-02-15: .1 mL via INTRAMUSCULAR

## 2016-02-15 MED ORDER — GLYCOPYRROLATE 0.2 MG/ML IJ SOLN
INTRAMUSCULAR | Status: DC | PRN
  Administered 2016-02-15: 0.2 mg via INTRAVENOUS

## 2016-02-15 MED ORDER — GLYCOPYRROLATE 0.2 MG/ML IV SOSY
PREFILLED_SYRINGE | INTRAVENOUS | Status: AC
  Filled 2016-02-15: qty 3

## 2016-02-15 MED ORDER — PROMETHAZINE HCL 25 MG/ML IJ SOLN: 6.2500 mg | INTRAMUSCULAR | Status: DC | PRN

## 2016-02-15 MED ORDER — ONDANSETRON HCL 4 MG/2ML IJ SOLN
INTRAMUSCULAR | Status: AC
  Filled 2016-02-15: qty 2

## 2016-02-15 MED ORDER — EPINEPHRINE HCL 1 MG/ML IJ SOLN
INTRAMUSCULAR | Status: AC
  Filled 2016-02-15: qty 1

## 2016-02-15 MED ORDER — LIDOCAINE HCL (CARDIAC) 20 MG/ML IV SOLN
INTRAVENOUS | Status: DC | PRN
Start: 2016-02-15 — End: 2016-02-15
  Administered 2016-02-15: 60 mg via INTRAVENOUS

## 2016-02-15 MED ORDER — DEXAMETHASONE SODIUM PHOSPHATE 10 MG/ML IJ SOLN
INTRAMUSCULAR | Status: DC | PRN
Start: 2016-02-15 — End: 2016-02-15
  Administered 2016-02-15: 5 mg via INTRAVENOUS

## 2016-02-15 MED ORDER — SODIUM CHLORIDE 0.9 % IR SOLN
Status: DC | PRN
  Administered 2016-02-15 (×4): 3000 mL

## 2016-02-15 MED ORDER — OXYCODONE-ACETAMINOPHEN 10-325 MG PO TABS: 1.0000 | ORAL_TABLET | Freq: Four times a day (QID) | ORAL | 0 refills | Status: DC | PRN

## 2016-02-15 MED ORDER — PROPOFOL 10 MG/ML IV BOLUS
INTRAVENOUS | Status: AC
  Filled 2016-02-15: qty 20

## 2016-02-15 MED ORDER — BUPIVACAINE HCL (PF) 0.25 % IJ SOLN
INTRAMUSCULAR | Status: DC | PRN
Start: 2016-02-15 — End: 2016-02-15
  Administered 2016-02-15: 10 mL

## 2016-02-15 MED ORDER — HYDROMORPHONE HCL 1 MG/ML IJ SOLN: 0.2500 mg | INTRAMUSCULAR | Status: DC | PRN

## 2016-02-15 MED ORDER — MIDAZOLAM HCL 2 MG/2ML IJ SOLN
INTRAMUSCULAR | Status: AC
  Administered 2016-02-15: 1 mg via INTRAVENOUS
  Filled 2016-02-15: qty 2

## 2016-02-15 MED ORDER — FENTANYL CITRATE (PF) 100 MCG/2ML IJ SOLN
INTRAMUSCULAR | Status: DC | PRN
  Administered 2016-02-15: 50 ug via INTRAVENOUS

## 2016-02-15 MED ORDER — SODIUM CHLORIDE 0.9 % IJ SOLN
INTRAMUSCULAR | Status: DC | PRN
  Administered 2016-02-15: 20 mL
  Administered 2016-02-15: 30 mL

## 2016-02-15 MED ORDER — LACTATED RINGERS IV SOLN
INTRAVENOUS | Status: DC
  Administered 2016-02-15: 12:00:00 via INTRAVENOUS

## 2016-02-15 MED ORDER — FENTANYL CITRATE (PF) 100 MCG/2ML IJ SOLN
50.0000 ug | Freq: Once | INTRAMUSCULAR | Status: AC
  Administered 2016-02-15: 50 ug via INTRAVENOUS

## 2016-02-15 MED ORDER — PROPOFOL 10 MG/ML IV BOLUS
INTRAVENOUS | Status: DC | PRN
  Administered 2016-02-15: 30 mg via INTRAVENOUS
  Administered 2016-02-15: 150 mg via INTRAVENOUS

## 2016-02-15 MED ORDER — SUGAMMADEX SODIUM 200 MG/2ML IV SOLN
INTRAVENOUS | Status: DC | PRN
  Administered 2016-02-15: 200 mg via INTRAVENOUS

## 2016-02-15 MED ORDER — CLONIDINE HCL (ANALGESIA) 100 MCG/ML EP SOLN
100.0000 ug | Freq: Once | EPIDURAL | Status: DC
  Filled 2016-02-15: qty 1

## 2016-02-15 MED ORDER — PHENYLEPHRINE HCL 10 MG/ML IJ SOLN
INTRAMUSCULAR | Status: DC | PRN
  Administered 2016-02-15: 80 ug via INTRAVENOUS
  Administered 2016-02-15: 40 ug via INTRAVENOUS

## 2016-02-15 MED ORDER — METHOCARBAMOL 500 MG PO TABS
500.0000 mg | ORAL_TABLET | Freq: Four times a day (QID) | ORAL | 0 refills | Status: DC
Start: 2016-02-15 — End: 2016-09-20

## 2016-02-15 MED ORDER — EPHEDRINE 5 MG/ML INJ
INTRAVENOUS | Status: AC
  Filled 2016-02-15: qty 10

## 2016-02-15 MED ORDER — DEXAMETHASONE SODIUM PHOSPHATE 10 MG/ML IJ SOLN
INTRAMUSCULAR | Status: AC
  Filled 2016-02-15: qty 1

## 2016-02-15 MED ORDER — BUPIVACAINE HCL (PF) 0.25 % IJ SOLN
INTRAMUSCULAR | Status: AC
  Filled 2016-02-15: qty 30

## 2016-02-15 MED ORDER — DEXTROSE 5 % IV SOLN
INTRAVENOUS | Status: DC | PRN
  Administered 2016-02-15: 30 ug/min via INTRAVENOUS

## 2016-02-15 MED ORDER — FENTANYL CITRATE (PF) 100 MCG/2ML IJ SOLN
INTRAMUSCULAR | Status: AC
  Filled 2016-02-15: qty 2

## 2016-02-15 MED ORDER — EPHEDRINE SULFATE 50 MG/ML IJ SOLN
INTRAMUSCULAR | Status: DC | PRN
Start: 2016-02-15 — End: 2016-02-15
  Administered 2016-02-15: 10 mg via INTRAVENOUS
  Administered 2016-02-15: 5 mg via INTRAVENOUS

## 2016-02-15 SURGICAL SUPPLY — 70 items
BENZOIN TINCTURE PRP APPL 2/3 (GAUZE/BANDAGES/DRESSINGS) ×3 IMPLANT
BLADE CUTTER GATOR 3.5 (BLADE) IMPLANT
BLADE GREAT WHITE 4.2 (BLADE) ×2 IMPLANT
BLADE GREAT WHITE 4.2MM (BLADE) ×1
BLADE SURG 11 STRL SS (BLADE) ×3 IMPLANT
BUR OVAL 6.0 (BURR) ×3 IMPLANT
CLOSURE WOUND 1/2 X4 (GAUZE/BANDAGES/DRESSINGS) ×1
COVER SURGICAL LIGHT HANDLE (MISCELLANEOUS) ×3 IMPLANT
DRAPE INCISE IOBAN 66X45 STRL (DRAPES) ×6 IMPLANT
DRAPE STERI 35X30 U-POUCH (DRAPES) ×3 IMPLANT
DRAPE U-SHAPE 47X51 STRL (DRAPES) ×6 IMPLANT
DRSG PAD ABDOMINAL 8X10 ST (GAUZE/BANDAGES/DRESSINGS) ×9 IMPLANT
DRSG TEGADERM 4X4.75 (GAUZE/BANDAGES/DRESSINGS) ×9 IMPLANT
DURAPREP 26ML APPLICATOR (WOUND CARE) ×3 IMPLANT
ELECT REM PT RETURN 9FT ADLT (ELECTROSURGICAL) ×3
ELECTRODE REM PT RTRN 9FT ADLT (ELECTROSURGICAL) ×1 IMPLANT
FILTER STRAW FLUID ASPIR (MISCELLANEOUS) ×3 IMPLANT
GAUZE SPONGE 4X4 12PLY STRL (GAUZE/BANDAGES/DRESSINGS) ×3 IMPLANT
GAUZE XEROFORM 1X8 LF (GAUZE/BANDAGES/DRESSINGS) ×3 IMPLANT
GLOVE BIO SURGEON ST LM GN SZ9 (GLOVE) ×3 IMPLANT
GLOVE BIO SURGEON STRL SZ 6.5 (GLOVE) ×2 IMPLANT
GLOVE BIO SURGEONS STRL SZ 6.5 (GLOVE) ×1
GLOVE BIOGEL PI IND STRL 6.5 (GLOVE) ×1 IMPLANT
GLOVE BIOGEL PI IND STRL 8 (GLOVE) ×1 IMPLANT
GLOVE BIOGEL PI INDICATOR 6.5 (GLOVE) ×2
GLOVE BIOGEL PI INDICATOR 8 (GLOVE) ×2
GLOVE SURG ORTHO 8.0 STRL STRW (GLOVE) ×6 IMPLANT
GLOVE SURG SS PI 8.5 STRL IVOR (GLOVE) ×2
GLOVE SURG SS PI 8.5 STRL STRW (GLOVE) ×1 IMPLANT
GOWN STRL REUS W/ TWL LRG LVL3 (GOWN DISPOSABLE) ×3 IMPLANT
GOWN STRL REUS W/TWL LRG LVL3 (GOWN DISPOSABLE) ×6
KIT BASIN OR (CUSTOM PROCEDURE TRAY) ×3 IMPLANT
KIT ROOM TURNOVER OR (KITS) ×3 IMPLANT
MANIFOLD NEPTUNE II (INSTRUMENTS) ×3 IMPLANT
NDL SUT 6 .5 CRC .975X.05 MAYO (NEEDLE) IMPLANT
NEEDLE HYPO 25X1 1.5 SAFETY (NEEDLE) ×3 IMPLANT
NEEDLE MAYO TAPER (NEEDLE)
NEEDLE SPNL 18GX3.5 QUINCKE PK (NEEDLE) ×3 IMPLANT
NS IRRIG 1000ML POUR BTL (IV SOLUTION) ×3 IMPLANT
PACK SHOULDER (CUSTOM PROCEDURE TRAY) ×3 IMPLANT
PAD ARMBOARD 7.5X6 YLW CONV (MISCELLANEOUS) ×6 IMPLANT
SET ARTHROSCOPY TUBING (MISCELLANEOUS) ×2
SET ARTHROSCOPY TUBING LN (MISCELLANEOUS) ×1 IMPLANT
SLING ARM IMMOBILIZER LRG (SOFTGOODS) ×3 IMPLANT
SLING ARM IMMOBILIZER MED (SOFTGOODS) IMPLANT
SPONGE GAUZE 4X4 12PLY STER LF (GAUZE/BANDAGES/DRESSINGS) ×3 IMPLANT
SPONGE LAP 4X18 X RAY DECT (DISPOSABLE) ×6 IMPLANT
STRIP CLOSURE SKIN 1/2X4 (GAUZE/BANDAGES/DRESSINGS) ×2 IMPLANT
SUCTION FRAZIER HANDLE 10FR (MISCELLANEOUS) ×2
SUCTION TUBE FRAZIER 10FR DISP (MISCELLANEOUS) ×1 IMPLANT
SUT ETHILON 3 0 PS 1 (SUTURE) ×9 IMPLANT
SUT FIBERWIRE 2-0 18 17.9 3/8 (SUTURE)
SUT MNCRL 3 0 RB1 (SUTURE) IMPLANT
SUT MONOCRYL 3 0 RB1 (SUTURE)
SUT VIC AB 0 CT1 27 (SUTURE)
SUT VIC AB 0 CT1 27XBRD ANBCTR (SUTURE) IMPLANT
SUT VIC AB 1 CT1 27 (SUTURE)
SUT VIC AB 1 CT1 27XBRD ANBCTR (SUTURE) IMPLANT
SUT VIC AB 2-0 CT1 27 (SUTURE)
SUT VIC AB 2-0 CT1 TAPERPNT 27 (SUTURE) IMPLANT
SUT VIC AB 3-0 FS2 27 (SUTURE) ×3 IMPLANT
SUT VICRYL 0 UR6 27IN ABS (SUTURE) IMPLANT
SUTURE FIBERWR 2-0 18 17.9 3/8 (SUTURE) IMPLANT
SYR 20CC LL (SYRINGE) ×6 IMPLANT
SYR 3ML LL SCALE MARK (SYRINGE) ×3 IMPLANT
SYR TB 1ML LUER SLIP (SYRINGE) ×3 IMPLANT
TOWEL OR 17X24 6PK STRL BLUE (TOWEL DISPOSABLE) ×3 IMPLANT
TOWEL OR 17X26 10 PK STRL BLUE (TOWEL DISPOSABLE) ×3 IMPLANT
WAND HAND CNTRL MULTIVAC 90 (MISCELLANEOUS) ×3 IMPLANT
WATER STERILE IRR 1000ML POUR (IV SOLUTION) ×3 IMPLANT

## 2016-02-15 NOTE — Progress Notes (Addendum)
Patient placed on monitor for nerve block. Data not crossing over. Patient maintained NSR rate 65- 70. SpO2 98- 100%, EtCO2 35- 40, RR 15- 20. BP as noted on flow sheet

## 2016-02-15 NOTE — Anesthesia Preprocedure Evaluation (Addendum)
Anesthesia Evaluation  Patient identified by MRN, date of birth, ID band Patient awake    Reviewed: Allergy & Precautions, NPO status , Patient's Chart, lab work & pertinent test results  Airway Mallampati: II  TM Distance: >3 FB Neck ROM: Full    Dental  (+) Edentulous Upper, Edentulous Lower   Pulmonary neg pulmonary ROS,    breath sounds clear to auscultation       Cardiovascular hypertension, Pt. on medications  Rhythm:Regular Rate:Normal     Neuro/Psych negative neurological ROS     GI/Hepatic negative GI ROS, Neg liver ROS,   Endo/Other  diabetes, Type 2, Oral Hypoglycemic Agents  Renal/GU negative Renal ROS     Musculoskeletal  (+) Arthritis ,   Abdominal (+) + obese,   Peds  Hematology negative hematology ROS (+)   Anesthesia Other Findings   Reproductive/Obstetrics                             Anesthesia Physical  Anesthesia Plan  ASA: III  Anesthesia Plan: General   Post-op Pain Management:  Regional for Post-op pain   Induction: Intravenous  Airway Management Planned: Oral ETT  Additional Equipment:   Intra-op Plan:   Post-operative Plan: Extubation in OR  Informed Consent: I have reviewed the patients History and Physical, chart, labs and discussed the procedure including the risks, benefits and alternatives for the proposed anesthesia with the patient or authorized representative who has indicated his/her understanding and acceptance.     Plan Discussed with: CRNA and Anesthesiologist  Anesthesia Plan Comments: (Type 2 DM glucose 178 Hypertension Obesity  Plan GA with oral ETT and interscalene block)        Anesthesia Quick Evaluation

## 2016-02-15 NOTE — Anesthesia Procedure Notes (Signed)
Procedure Name: Intubation Date/Time: 02/15/2016 1:25 PM Performed by: Freddie Breech Pre-anesthesia Checklist: Patient identified, Emergency Drugs available, Suction available and Patient being monitored Patient Re-evaluated:Patient Re-evaluated prior to inductionOxygen Delivery Method: Circle System Utilized Preoxygenation: Pre-oxygenation with 100% oxygen Intubation Type: IV induction Ventilation: Mask ventilation without difficulty Laryngoscope Size: Mac and 3 Grade View: Grade I Tube type: Oral Number of attempts: 1 Airway Equipment and Method: Stylet and Oral airway Placement Confirmation: ETT inserted through vocal cords under direct vision,  positive ETCO2 and breath sounds checked- equal and bilateral Secured at: 23 cm Tube secured with: Tape Dental Injury: Teeth and Oropharynx as per pre-operative assessment

## 2016-02-15 NOTE — Anesthesia Procedure Notes (Signed)
Anesthesia Regional Block:  Interscalene brachial plexus block  Pre-Anesthetic Checklist: ,, timeout performed, Correct Patient, Correct Site, Correct Laterality, Correct Procedure, Correct Position, site marked, Risks and benefits discussed,  Surgical consent,  Pre-op evaluation,  At surgeon's request and post-op pain management  Laterality: Right  Prep: chloraprep       Needles:  Injection technique: Single-shot  Needle Type: Echogenic Stimulator Needle     Needle Length: 9cm 9 cm Needle Gauge: 21 and 21 G    Additional Needles:  Procedures: ultrasound guided (picture in chart) and nerve stimulator Interscalene brachial plexus block  Nerve Stimulator or Paresthesia:  Response: deltoid, 0.5 mA,   Additional Responses:   Narrative:  Start time: 02/15/2016 11:57 AM End time: 02/15/2016 12:06 PM Injection made incrementally with aspirations every 5 mL.  Performed by: Personally  Anesthesiologist: Suzette Battiest

## 2016-02-15 NOTE — Transfer of Care (Signed)
Immediate Anesthesia Transfer of Care Note  Patient: Briana Alvarez  Procedure(s) Performed: Procedure(s): SHOULDER DIAGNOSTIC OPERATIVE ARTHROSCOPY WITH SUBACROMIAL DECOMPRESSION AND DISTAL CLAVICLE EXCISION (Right)  Patient Location: PACU  Anesthesia Type:General  Level of Consciousness: Awake. Alert. Oriented -Responds appropriately to questions.    Airway & Oxygen Therapy: Patient Spontanous Breathing and Patient connected to nasal cannula oxygen  Post-op Assessment: Report given to RN and Post -op Vital signs reviewed and stable  Post vital signs: Reviewed and stable  Last Vitals:  Vitals:   02/15/16 1215 02/15/16 1220  BP: (!) 160/81 (!) 153/69  Pulse:  70  Resp:  17  Temp:      Last Pain:  Vitals:   02/15/16 1043  TempSrc: Oral         Complications: No apparent anesthesia complications

## 2016-02-15 NOTE — H&P (Signed)
Briana Alvarez is an 55 y.o. female.   Chief Complaint: Right shoulder pain HPI: Briana Alvarez is a 55 year old patient with right shoulder pain.  Been ongoing for many months.  She's had  good relief with injections but it is not been sustained.  The injections were placed in the before meals joint.  She's had similar problems with resolution of symptoms on the left-hand side with arthroscopic distal clavicle excision.  On the right-hand side MRI scan is consistent with before meals joint arthritis along with bursitis and partial-thickness tearing of the rotator cuff.  She describes pain localizing to the superior aspect of the shoulder worse with crossarm adduction.  Past Medical History:  Diagnosis Date  . Anxiety   . Arthritis   . Depression   . Diabetes mellitus   . Headache    "2 a month"  . Hypertension   . Neuropathy ([place])   . Pneumonia     Past Surgical History:  Procedure Laterality Date  . ABDOMINAL HYSTERECTOMY    . CARPAL TUNNEL RELEASE    . KNEE ARTHROSCOPY Right   . KNEE SURGERY Left   . NECK SURGERY    . SHOULDER ARTHROSCOPY    . TONSILLECTOMY    . ulner nerve       Family History  Problem Relation Age of Onset  . Cancer Mother   . Hypertension Mother   . Diabetes Daughter   . Diabetes Maternal Grandmother   . Hypertension Maternal Grandmother   . Heart attack Neg Hx   . Stroke Neg Hx    Social History:  reports that she has never smoked. She has never used smokeless tobacco. She reports that she does not drink alcohol or use drugs.  Allergies:  Allergies  Allergen Reactions  . Shrimp [Shellfish Allergy] Anaphylaxis and Hives    Shrimp - hives    Facility-Administered Medications Prior to Admission  Medication Dose Route Frequency Provider Last Rate Last Dose  . triamcinolone acetonide (KENALOG) 10 MG/ML injection 10 mg  10 mg Other Once [employer], DPM      . triamcinolone acetonide (KENALOG) 10 MG/ML injection 10 mg  10 mg Other Once [employer], DPM      . triamcinolone acetonide (KENALOG) 10 MG/ML injection 10 mg  10 mg Other Once Landis Martins, DPM       Medications Prior to Admission  Medication Sig Dispense Refill  . benazepril (LOTENSIN) 10 MG tablet Take 1 tablet (10 mg total) by mouth daily. 30 tablet 11  . exenatide (BYETTA 5 MCG PEN) 5 MCG/0.02ML SOPN injection Inject 0.02 mLs (5 mcg total) into the skin 2 (two) times daily with a meal. 1.2 mL 3  . gabapentin (NEURONTIN) 300 MG capsule TAKE 3 CAPSULES BY MOUTH 3 TIMES A DAY 270 capsule 0  . metFORMIN (GLUCOPHAGE) 1000 MG tablet TAKE 1 TABLET BY MOUTH 2 TIMES DAILY WITH A MEAL. 60 tablet 3  . Multiple Vitamins-Minerals (WOMENS 50+ MULTI VITAMIN/MIN PO) Take 1 tablet by mouth daily.    Marland Kitchen topiramate (TOPAMAX) 50 MG tablet Take 1 tablet (50 mg total) by mouth 2 (two) times daily. 60 tablet 12  . traZODone (DESYREL) 150 MG tablet Take 2 tablets (300 mg total) by mouth at bedtime. 180 tablet 5  . albuterol (PROVENTIL HFA;VENTOLIN HFA) 108 (90 BASE) MCG/ACT inhaler Inhale 2 puffs into the lungs every 6 (six) hours as needed for wheezing or shortness of breath. 1 Inhaler 2  . rizatriptan (MAXALT-MLT) 10 MG  disintegrating tablet Take 1 tablet (10 mg total) by mouth as needed for migraine. May repeat in 2 hours if needed 9 tablet 11    Results for orders placed or performed during the hospital encounter of 02/15/16 (from the past 48 hour(s))  Glucose, capillary     Status: Abnormal   Collection Time: 02/15/16 10:39 AM  Result Value Ref Range   Glucose-Capillary 135 (H) 65 - 99 mg/dL   Comment 1 Notify RN    Comment 2 Document in Chart   Glucose, capillary     Status: Abnormal   Collection Time: 02/15/16 12:37 PM  Result Value Ref Range   Glucose-Capillary 153 (H) 65 - 99 mg/dL   No results found.  Review of Systems  Musculoskeletal: Positive for joint pain.  All other systems reviewed and are negative.   Blood pressure (!) 153/69, pulse 70, temperature 98.2 F  (36.8 C), temperature source Oral, resp. rate 17, weight 97.1 kg (214 lb), SpO2 100 %. Physical Exam  Constitutional: She appears well-developed.  HENT:  Head: Normocephalic.  Eyes: Pupils are equal, round, and reactive to light.  Neck: Normal range of motion.  Respiratory: Effort normal.  Neurological: She is alert.  Skin: Skin is warm.  Psychiatric: She has a normal mood and affect.  Examination of the right shoulder demonstrates good rotator cuff strength positive impingement signs pain with palpation of the before meals joint pain with crossarm adduction no restriction of passive external rotation at 15 of abduction and negative O'Brien's testing no other masses lymph adenopathy or skin changes noted in the right shoulder girdle region    Assessment/Plan Impression is right shoulder bursitis and before meals joint arthritis plan arthroscopy debridement subacromial decompression distal clavicle excision risk and benefits discussed with the patient including not limited to infection incomplete pain relief shoulder stiffness nerve vessel damage AND answered anticipate outpatient surgery with discharge home the same day. Meredith Pel, MD 02/15/2016, 1:09 PM

## 2016-02-15 NOTE — Anesthesia Procedure Notes (Signed)
Procedures

## 2016-02-15 NOTE — Brief Op Note (Signed)
02/15/2016  3:24 PM  PATIENT:  Briana Alvarez  55 y.o. female  PRE-OPERATIVE DIAGNOSIS:  RIGHT SHOULDER BURSITIS, AC OSTEOARTHRITIS  POST-OPERATIVE DIAGNOSIS:  RIGHT SHOULDER BURSITIS, AC OSTEOARTHRITIS  PROCEDURE:  Procedure(s): SHOULDER DIAGNOSTIC OPERATIVE ARTHROSCOPY WITH SUBACROMIAL DECOMPRESSION AND DISTAL CLAVICLE EXCISION  SURGEON:  Surgeon(s): Meredith Pel, MD  ASSISTANT: none  ANESTHESIA:   regional and general  EBL: 35 ml    Total I/O In: -  Out: 315 [Urine:300; Blood:15]  BLOOD ADMINISTERED: none  DRAINS: none   LOCAL MEDICATIONS USED:  marcaine  SPECIMEN:  No Specimen  COUNTS:  YES  TOURNIQUET:  * No tourniquets in log *  DICTATION: .Other Dictation: Dictation Number [id number]  PLAN OF CARE: Discharge to home after PACU  PATIENT DISPOSITION:  PACU - hemodynamically stable

## 2016-02-16 ENCOUNTER — Encounter (HOSPITAL_COMMUNITY): Payer: Self-pay | Admitting: Orthopedic Surgery

## 2016-02-16 NOTE — Anesthesia Postprocedure Evaluation (Signed)
Anesthesia Post Note  Patient: Briana Alvarez  Procedure(s) Performed: Procedure(s) (LRB): SHOULDER DIAGNOSTIC OPERATIVE ARTHROSCOPY WITH SUBACROMIAL DECOMPRESSION AND DISTAL CLAVICLE EXCISION (Right)  Patient location during evaluation: PACU Anesthesia Type: General Level of consciousness: awake and alert Pain management: pain level controlled Vital Signs Assessment: post-procedure vital signs reviewed and stable Respiratory status: spontaneous breathing, nonlabored ventilation, respiratory function stable and patient connected to nasal cannula oxygen Cardiovascular status: blood pressure returned to baseline and stable Postop Assessment: no signs of nausea or vomiting Anesthetic complications: no     Last Vitals:  Vitals:   02/15/16 1556 02/15/16 1606  BP: (!) 166/98 (!) 171/93  Pulse: 74 70  Resp: 16   Temp: 36.4 C     Last Pain:  Vitals:   02/15/16 1606  TempSrc:   PainSc: 0-No pain   Pain Goal:                 Reginal Lutes

## 2016-02-16 NOTE — Progress Notes (Signed)
Subjective:     Patient ID: Briana Alvarez, female   DOB: Sep 05, 1960, 55 y.o.   MRN: [id number]  HPI patient states that she is having pain in her arches and she is due for shoulder surgery today   Review of Systems     Objective:   Physical Exam Neurovascular status intact muscle strength adequate with discomfort in the arch region bilateral with inflammation and fluid within the arch itself    Assessment:     Plantar fasciitis left arch and right arch with pain    Plan:     Dispensed night splint with instructions on usage along with continued physical therapy and reappoint to recheck

## 2016-02-16 NOTE — Op Note (Deleted)
  The note originally documented on this encounter has been moved the the encounter in which it belongs.  

## 2016-02-16 NOTE — Op Note (Signed)
Briana Alvarez, FINAN                ACCOUNT NO.:  [id number]  MEDICAL RECORD NO.:  [id number]  LOCATION:                               FACILITY:  [facility]  PHYSICIAN:  Anderson Malta, M.D.    DATE OF BIRTH:  [birth year]  DATE OF PROCEDURE: DATE OF DISCHARGE:                              OPERATIVE REPORT   PREOPERATIVE DIAGNOSIS:  Right shoulder acromioclavicular joint arthritis and bursitis.  POSTOPERATIVE DIAGNOSIS:  Right shoulder acromioclavicular joint arthritis and bursitis.  PROCEDURE:  Right shoulder arthroscopy, limited debridement, intra- articular of chondral injury, anterior-inferior glenoid and labral fraying with subsequent subacromial decompression, acromioplasty and arthroscopic distal clavicle excision.  SURGEON:  Anderson Malta, M.D.  ASSISTANT:  None.  ANESTHESIA:  General plus regional.  INDICATIONS:  [name] is a 55 year old patient, right shoulder pain, good response to [facility] joint injections, arthritis by MRI scan, presents for operative management after explanation of risks and benefits.  OPERATIVE FINDINGS:  Examination under anesthesia, range of motion, forward flexion of 170, external rotation 50 degrees, abduction is 70, shoulder stability is good.  Anterior-posterior testing, less than a centimeter sulcus sign.  No rotator cuff tears present.  Mild degenerative changes present on the undersurface of the labrum.  AC joint arthritis is present.  PROCEDURE IN DETAIL:  The patient was brought to the operating room where general anesthetic was induced.  Preoperative antibiotics were administered.  Time-out was called.  The patient was placed in beach- chair position with the head in neutral position.  Right arm was prescrubbed with alcohol and Betadine, allowed to air dry, prepped with DuraPrep solution and draped in a sterile manner.  Charlie Pitter was used cover the axilla.  The [facility] joint was localized with a 25-gauge needle. Solution of saline, epinephrine placed  into the subacromial space below the [facility] joint.  Posterior portal created 2 cm medial and inferior to the posterolateral margin of the acromion.  Diagnostic arthroscopy was performed after hemostat was placed into the posterior aspect of the shoulder.  The patient did have some anterior-inferior glenoid grade 4 chondromalacia changes over about an 8 x 8 squared millimeter area. Anterior-inferior and posterior-inferior glenohumeral ligaments were intact.  Labrum had fraying, which was debrided.  Rotator cuff was intact.  Biceps tendon was intact.  Scope placed in the subacromial space.  Significant bursitis was present.  Instrument was removed, lateral portal created under direct visualization.  Acromioplasty and release of the CA ligament were performed.  At this time, burr placed to the anterior portal and the distal 8-9 mm of distal clavicle was excised with preservation of the superior and posterior ligaments.  Thorough irrigation was performed.  Instruments were removed.  Portals were closed using 3-0 Vicryl, 3-0 nylon.  Impervious dressing was placed.  The patient tolerated the procedure well without immediate complication, transferred to the recovery room in stable condition.     Anderson Malta, M.D.   ______________________________ Darnell Level. Alphonzo Severance, M.D.    GSD/MEDQ  D:  02/15/2016  T:  02/16/2016  Job:  [id number]

## 2016-03-07 ENCOUNTER — Ambulatory Visit (INDEPENDENT_AMBULATORY_CARE_PROVIDER_SITE_OTHER): Payer: Medicaid Other | Admitting: Podiatry

## 2016-03-07 DIAGNOSIS — M79672 Pain in left foot: Secondary | ICD-10-CM

## 2016-03-07 DIAGNOSIS — M79671 Pain in right foot: Secondary | ICD-10-CM

## 2016-03-07 DIAGNOSIS — E1140 Type 2 diabetes mellitus with diabetic neuropathy, unspecified: Secondary | ICD-10-CM

## 2016-03-07 DIAGNOSIS — M722 Plantar fascial fibromatosis: Secondary | ICD-10-CM

## 2016-03-07 NOTE — Progress Notes (Signed)
Subjective: 55 year old female presents today for evaluation of bilateral heel pain. Patient states that the pain is still very painful and her orthotics that she currently has do help however sooner she's knowing her orthotics she expresses severe pain bilaterally. She states that she's had the plantar fasciitis for about 6 years now and all conservative methods of treatment modalities have failed. Patient presents today for surgical consultation.  Objective: Physical Exam General: The patient is alert and oriented x3 in no acute distress.  Dermatology: Skin is warm, dry and supple bilateral lower extremities. Negative for open lesions or macerations bilateral.   Vascular: Dorsalis Pedis and Posterior Tibial pulses palpable bilateral.  Capillary fill time is immediate to all digits.  Neurological: Epicritic and protective threshold intact bilateral.   Musculoskeletal: Tenderness to palpation at the medial calcaneal tubercale and through the insertion of the plantar fascia of the bilateral feet. All other joints range of motion within normal limits bilateral. Strength 5/5 in all groups bilateral.   Assessment: #1 pain in bilateral feet #2 plantar fasciitis bilateral  Problem List Items Addressed This Visit    None    Visit Diagnoses    Plantar fasciitis, bilateral    -  Primary       Plan of Care:   1. Patient evaluated. Xrays reviewed.   2. Discussed both conservative and surgical options for the patient in detail. The patient consented for surgical intervention.  3. Instructed patient regarding therapies and modalities at home to alleviate symptoms.  4. Rx for custom molded orthotics at [place] [facility] was given to the patient 5. Patient is scheduled for endoscopic plantar fasciotomy bilateral. She is to return to clinic 1 week postop.

## 2016-03-07 NOTE — Patient Instructions (Signed)
Endoscopic Plantar Fasciotomy On the underside of the foot and heel is a tight band of tissue called the plantar fascia. Sometimes the plantar fascia become inflamed (the body's way of reacting to injury, overuse or infection) which produces pain. The condition is known as plantar fasciitis.  One way to treat plantar fasciitis is through an endoscopic plantar fasciotomy. This is surgery to reduce the tension on the plantar fascia. However, it is a minimally invasive surgery because there will be no large incision. Instead, the surgeon inserts a thin, flexible tube through a small (1/16th of an inch (1.59 mm)) cut in your skin. The surgeon can examine and release the fascia through this tube. Recovery from an endoscopic fasciotomy is usually less painful and faster than from open surgery. LET YOUR CAREGIVER KNOW ABOUT:  Any allergies.  All medications you are taking, including:  Herbs, eyedrops, over-the-counter medications and creams.  Blood thinners (anticoagulants) or other drugs that could affect blood clotting.  Use of steroids (by mouth or as creams).  Previous problems with anesthetics, including local anesthetics.  Possibility of pregnancy, if this applies.  Any history of blood clots.  Any history of bleeding or other blood problems.  Previous surgery.  Smoking history.  Other health problems.  Family history of anesthetic problems RISKS AND COMPLICATIONS  Short-term possibilities include:  Excessive bleeding.  Pain.  Loss of feeling (numbness) at the site of the incision.  Hematoma, a pooling of blood in the wound.  Infection.  Slow resolution of the symptoms. Longer-term possibilities include:  Scarring.  A return of the condition that led to fasciotomy.  Damage to nerves in the area.  Weakness in your foot.  Need for additional surgery. BEFORE THE PROCEDURE  Ask whether you need to get shoes that will support your heel and arch while you  recover.  7 to 10 days before the surgery, stop using aspirin and non-steroidal anti-inflammatory drugs (NSAIDs) for pain relief. This includes prescription drugs and over-the-counter drugs such as ibuprofen and naproxen.  If you take blood-thinners, ask your healthcare provider when you should stop taking them.  Do not eat or drink for about 8 hours before your surgery.  You might be asked to shower or wash with a special antibacterial soap before the procedure.  Arrive 1-2 hours before the surgery, or whenever your surgeon recommends. This will give you time to check in and fill out any needed paperwork.  If your surgery is an outpatient procedure, you will be able to go home the same day. Make arrangements in advance for someone to drive you home. PROCEDURE  You may be given general anesthesia (you will be asleep), regional anesthesia (your leg will be numbed) or local anesthesia (just the area around the fascia will be numbed). With regional and local anesthesia you will be given medication to make you groggy but awake during the procedure.  Your foot will be cleaned and sterilized.  The surgeon will make a cut (incision) on the side of your heel. Then a thin tube that contains a tiny camera will be inserted into the space. The camera makes it possible for the surgeon to see what is happening inside your foot.  The surgeon will work through this tube to release the fascia.  The tube will be removed, and dressing will be applied to the incisions. AFTER THE PROCEDURE After the procedure, you will be taken to another room to recover. People usually go home the same day. Before leaving, make sure  you have detailed instructions on how to care for the incision. Also, you may be given crutches and shown how to use them. Ask your surgeon whether physical therapy will be needed.  HOME CARE INSTRUCTIONS   Take any prescription medication for pain and/or nausea that your surgeon prescribes.  Follow the directions carefully and take all of the medication.  Ask your surgeon whether you can take over-the-counter medicines for pain, discomfort or fever. Do not take aspirin unless your healthcare provider says to. Aspirin can increase the chances of bleeding.  You may need to put ice on your foot for 10 to 15 minutes each day for several days.  While you are resting, keep your foot elevated above the level of your heart.  Do not get the incisions wet for the first few days after surgery (or until the surgeon tells you it is OK).  Avoid standing or walking for long periods. Your healthcare provider will tell you when you are clear to resume normal activity. If your job requires a lot of standing or walking, ask to be assigned to a less active position for about 8 weeks.  When you are up and about, wear shoes with a supportive heel and arch support. Soft running shoes may be recommended for the first two weeks of recovery. SEEK MEDICAL CARE IF:   The wound becomes red or swollen.  The wound leaks fluid or blood.  Your pain increases.  You become nauseous or vomit for more than two days after the surgery.  You have pain or difficulty moving your foot.  You develop a fever of more than 100.5 F (38.1 C). SEEK IMMEDIATE MEDICAL CARE IF:   Your leg or foot starts to swell.  You develop a fever of 102.0 F (38.9 C) or higher.   This information is not intended to replace advice given to you by your health care provider. Make sure you discuss any questions you have with your health care provider.   Document Released: 04/03/2009 Document Revised: 08/29/2011 Document Reviewed: 02/17/2015 Elsevier Interactive Patient Education [employer].

## 2016-03-10 ENCOUNTER — Telehealth: Payer: Self-pay | Admitting: *Deleted

## 2016-03-10 ENCOUNTER — Encounter: Payer: Self-pay | Admitting: Podiatry

## 2016-03-10 DIAGNOSIS — M722 Plantar fascial fibromatosis: Secondary | ICD-10-CM | POA: Diagnosis not present

## 2016-03-10 MED ORDER — OXYCODONE-ACETAMINOPHEN 5-325 MG PO TABS: 1.0000 | ORAL_TABLET | ORAL | 0 refills | Status: DC | PRN

## 2016-03-10 MED ORDER — NABUMETONE 750 MG PO TABS: 750.0000 mg | ORAL_TABLET | Freq: Two times a day (BID) | ORAL | 1 refills | Status: DC

## 2016-03-10 MED FILL — OXYCODONE/APAP 5-325: 5-325 | 10 days supply | Qty: 60 | Fill #0

## 2016-03-10 NOTE — Telephone Encounter (Addendum)
Pt states pharmacy is unable to fill the pain medication, because Dr. Amalia Hailey is not in the Medicaid rx system. I collected the Perocet and nabumetone. Dr. Paulla Dolly prescribed the medications as ordered by Dr. Amalia Hailey for pt to take to [facility]. 07/04/2016-Pt states she saw Dr. Amalia Hailey today and was to [facility] but it is not there. 07/05/2016-Left message informing pt the [facility] on [address] ato 1:08pm today.

## 2016-03-14 ENCOUNTER — Ambulatory Visit (INDEPENDENT_AMBULATORY_CARE_PROVIDER_SITE_OTHER): Payer: Medicaid Other | Admitting: Podiatry

## 2016-03-14 DIAGNOSIS — Z9889 Other specified postprocedural states: Secondary | ICD-10-CM

## 2016-03-14 NOTE — Progress Notes (Signed)
Patient ID: Briana Alvarez, female   DOB: 1960-06-24, 55 y.o.   MRN: [id number]  Subjective: Patient presents today 1 week postop bilateral EPF surgery. Patient states that she is doing well and she no longer experiences the sharp plantar fascial pains that she dispensed prior to surgery.  Objective: Incision sites are well coapted with sutures intact. Moderate ecchymosis and swelling noted to the bilateral feet.  Assessment: postop 1 week bilateral EPF surgery-Doing well without complaints  Plan of care: Patient is to return to clinic in 1 week for possible suture removal. New dressings applied. Keep dressings clean dry and intact. Weightbearing as tolerated in postoperative shoes.

## 2016-03-14 NOTE — Progress Notes (Signed)
DOS 09.21.2017 Endoscopic Plantar Fasciotomy Bilateral Feet

## 2016-03-14 NOTE — Progress Notes (Signed)
Post op

## 2016-03-23 ENCOUNTER — Ambulatory Visit (INDEPENDENT_AMBULATORY_CARE_PROVIDER_SITE_OTHER): Payer: Medicaid Other | Admitting: Orthopedic Surgery

## 2016-03-23 ENCOUNTER — Ambulatory Visit (INDEPENDENT_AMBULATORY_CARE_PROVIDER_SITE_OTHER): Payer: Medicaid Other | Admitting: Podiatry

## 2016-03-23 DIAGNOSIS — M19011 Primary osteoarthritis, right shoulder: Secondary | ICD-10-CM

## 2016-03-23 DIAGNOSIS — M7551 Bursitis of right shoulder: Secondary | ICD-10-CM

## 2016-03-23 DIAGNOSIS — E0842 Diabetes mellitus due to underlying condition with diabetic polyneuropathy: Secondary | ICD-10-CM | POA: Diagnosis not present

## 2016-03-23 DIAGNOSIS — M79671 Pain in right foot: Secondary | ICD-10-CM

## 2016-03-23 DIAGNOSIS — M79672 Pain in left foot: Secondary | ICD-10-CM | POA: Diagnosis not present

## 2016-03-23 DIAGNOSIS — Z9889 Other specified postprocedural states: Secondary | ICD-10-CM

## 2016-03-26 NOTE — Progress Notes (Signed)
Subjective:  Patient presents today for second postop visit for bilateral EPF surgery. Patient states that she's doing well. No longer experiences sharp plantar fascial pains.  Patient also has a new complaint of peripheral neuropathy. Patient is a diabetic and she states that she does get neuropathic pain especially at night when she tries to go to bed.   Patient also has a concern for ingrown nails.   Objective/Physical Exam General: The patient is alert and oriented x3 in no acute distress.  Dermatology: Skin is warm, dry and supple bilateral lower extremities. Negative for open lesions or macerations.  Vascular: Palpable pedal pulses bilaterally. No edema or erythema noted. Capillary refill within normal limits.  Neurological: Epicritic and protectivethreshold diminished bilaterally  Musculoskeletal Exam: Range of motion within normal limits to all pedal and ankle joints bilateral. Muscle strength 5/5 in all groups bilateral.   Assessment: #1 Postop visit #2-status post EPF surgery. Doing well. #2 diabetic peripheral neuropathy bilateral lower extremities.   Plan of Care:  #1 Patient was evaluated. #2 Today prescription for diabetic neuropathic pain cream was dispensed through [facility]. #3 patient is to return to clinic in 4 weeks #4 patient is to begin showering on 03/26/2016.   Dr. Edrick Kins, [place]

## 2016-04-20 ENCOUNTER — Ambulatory Visit: Payer: Medicaid Other | Admitting: Podiatry

## 2016-04-25 ENCOUNTER — Ambulatory Visit: Payer: Medicaid Other | Admitting: Podiatry

## 2016-05-02 ENCOUNTER — Ambulatory Visit (INDEPENDENT_AMBULATORY_CARE_PROVIDER_SITE_OTHER): Payer: Medicaid Other | Admitting: Podiatry

## 2016-05-02 DIAGNOSIS — M79671 Pain in right foot: Secondary | ICD-10-CM

## 2016-05-02 DIAGNOSIS — M79672 Pain in left foot: Secondary | ICD-10-CM

## 2016-05-02 DIAGNOSIS — M7752 Other enthesopathy of left foot: Secondary | ICD-10-CM | POA: Diagnosis not present

## 2016-05-02 DIAGNOSIS — M7751 Other enthesopathy of right foot: Secondary | ICD-10-CM

## 2016-05-02 DIAGNOSIS — E0842 Diabetes mellitus due to underlying condition with diabetic polyneuropathy: Secondary | ICD-10-CM

## 2016-05-02 DIAGNOSIS — Z9889 Other specified postprocedural states: Secondary | ICD-10-CM

## 2016-05-02 MED ORDER — NONFORMULARY OR COMPOUNDED ITEM: 1.0000 g | Freq: Four times a day (QID) | 2 refills | Status: DC

## 2016-05-04 ENCOUNTER — Ambulatory Visit (INDEPENDENT_AMBULATORY_CARE_PROVIDER_SITE_OTHER): Payer: Medicaid Other | Admitting: Orthopedic Surgery

## 2016-05-15 MED ORDER — BETAMETHASONE SOD PHOS & ACET 6 (3-3) MG/ML IJ SUSP: 3.0000 mg | Freq: Once | INTRAMUSCULAR | Status: AC

## 2016-05-15 NOTE — Progress Notes (Signed)
Subjective:  Patient presents today for third postop visit for bilateral EPF surgery. Patient states that she's doing well. No longer experiences sharp plantar fascial pains. She does have some pain and tenderness noted to the surgery sites.  Patient also complains of continued peripheral neuropathy. Patient is a diabetic and she states that she does get neuropathic pain especially at night when she tries to go to bed.   Patient also presents today with a new complaint of pain to the outside digits of the bilateral feet. She has developed the pain over the past few weeks she states that they're very painful when walking.   Objective/Physical Exam General: The patient is alert and oriented x3 in no acute distress.  Dermatology: Skin is warm, dry and supple bilateral lower extremities. Negative for open lesions or macerations.  Vascular: Palpable pedal pulses bilaterally. No edema or erythema noted. Capillary refill within normal limits.  Neurological: Epicritic and protectivethreshold diminished bilaterally  Musculoskeletal Exam: Pain on palpation and range of motion to the fifth MPJ bilateral feet. Range of motion within normal limits to all pedal and ankle joints bilateral. Muscle strength 5/5 in all groups bilateral.   Assessment: #1 Postop visit #3-status post EPF surgery. Doing well. #2 diabetic peripheral neuropathy bilateral lower extremities. #3 capsulitis fifth MPJ bilateral feet  Plan of Care:  #1 Patient was evaluated. #2 neuropathic pain cream dispensed through [facility] refill today #3 injection of 0.5 mL Celestone Soluspan injected in the fifth MPJ of the bilateral feet. #4 return to clinic in 4 weeks  Dr. Edrick Kins, [place]

## 2016-05-18 ENCOUNTER — Other Ambulatory Visit: Payer: Self-pay | Admitting: Podiatry

## 2016-05-18 NOTE — Telephone Encounter (Signed)
rx request for relafen refilled.

## 2016-05-18 NOTE — Telephone Encounter (Signed)
Yes, please refill Nabumetone (relafen) 750mg  #60 BID with one refill.  Thanks,  Dr. Amalia Hailey

## 2016-05-30 ENCOUNTER — Ambulatory Visit: Payer: Medicaid Other | Admitting: Podiatry

## 2016-06-15 ENCOUNTER — Ambulatory Visit: Payer: Medicaid Other | Admitting: Podiatry

## 2016-06-28 ENCOUNTER — Encounter: Payer: Self-pay | Admitting: Physician Assistant

## 2016-06-28 ENCOUNTER — Ambulatory Visit (INDEPENDENT_AMBULATORY_CARE_PROVIDER_SITE_OTHER): Payer: Medicaid Other | Admitting: Physician Assistant

## 2016-06-28 VITALS — BP 130/78 | HR 32 | Ht 66.0 in | Wt 214.0 lb

## 2016-06-28 DIAGNOSIS — I498 Other specified cardiac arrhythmias: Secondary | ICD-10-CM

## 2016-06-28 DIAGNOSIS — I10 Essential (primary) hypertension: Secondary | ICD-10-CM | POA: Diagnosis not present

## 2016-06-28 DIAGNOSIS — R079 Chest pain, unspecified: Secondary | ICD-10-CM | POA: Diagnosis not present

## 2016-06-28 DIAGNOSIS — E119 Type 2 diabetes mellitus without complications: Secondary | ICD-10-CM | POA: Diagnosis not present

## 2016-06-28 DIAGNOSIS — I499 Cardiac arrhythmia, unspecified: Secondary | ICD-10-CM | POA: Diagnosis not present

## 2016-06-28 MED ORDER — METOPROLOL SUCCINATE ER 25 MG PO TB24: 25.0000 mg | ORAL_TABLET | Freq: Every day | ORAL | 11 refills | Status: DC

## 2016-06-28 NOTE — Patient Instructions (Signed)
Medication Instructions:  Your physician has recommended you make the following change in your medication:  START Metoprolol 25mg  daily. An Rx has been sent to your pharmacy   Labwork: We have requested a copy of your lab work done today at your primary care provider's office   Testing/Procedures: Your physician has requested that you have an echocardiogram. Echocardiography is a painless test that uses sound waves to create images of your heart. It provides your doctor with information about the size and shape of your heart and how well your heart's chambers and valves are working. This procedure takes approximately one hour. There are no restrictions for this procedure.    Follow-Up: Your physician recommends that you schedule a follow-up appointment in: 1 week with Ermalinda Barrios, PA-C  Your physician recommends that you schedule a follow-up appointment in: 4 weeks with Dr.Varanasi    Any Other Special Instructions Will Be Listed Below (If Applicable).     If you need a refill on your cardiac medications before your next appointment, please call your pharmacy.

## 2016-06-28 NOTE — Progress Notes (Signed)
Cardiology Office Note    Date:  06/28/2016   ID:  Briana Alvarez, DOB [birth year], MRN [id number]  PCP:  Vicenta Aly, FNP  Cardiologist: Dr.Varanasi  Chief Complaint  Patient presents with  . Follow-up    History of Present Illness:  Briana Alvarez is a 56 y.o. female with history of hypertension since she was 56 years old, hyperlipidemia and diabetes mellitus. She previously had a cath in [place] for chest pain, abnormal EKG and syncope 13 years ago that was normal. She had chest pain in 03/2014 with negative workup in the ER and negative stress echo test. Last seen here 07/25/14 with elevated blood pressures 200/108. Atenolol stopped and she was started on benazepril 10 mg daily.  Patient was added onto my schedule today for evaluation of chest pain and bigeminy on EKG. Refer by Vicenta Aly, NP.  Complains of chest heaviness into right arm last 30 min. Occurs anytime, happens about twice a day for the past 2 weeks. Associated with shortness of breath. No diaphoresis. Under a lot of stress b/c she has temporary custody of her son's kids and he moved back. 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 Very active with housework. Activity doesn't cause symptoms. Usually she has a symptoms after a stressful incident and she sits down at night. Can also occur while driving. She doesn't necessarily feel her heart skipping. She drinks 16 ounce Coke daily and a cup of tea. No decongestants or over-the-counter medications. She also complains of a lot of fatigue and anxiety. She is tearful. She has never smoked and has no family history of CAD.    Past Medical History:  Diagnosis Date  . Anxiety   . Arthritis   . Depression   . Diabetes mellitus   . Headache    "2 a month"  . Hypertension   . Neuropathy ([place])   . Pneumonia     Past Surgical History:  Procedure Laterality Date  . ABDOMINAL HYSTERECTOMY    . CARPAL TUNNEL RELEASE    . KNEE ARTHROSCOPY Right   . KNEE SURGERY Left    . NECK SURGERY    . SHOULDER ARTHROSCOPY    . SHOULDER ARTHROSCOPY WITH SUBACROMIAL DECOMPRESSION AND BICEP TENDON REPAIR Right 02/15/2016   Procedure: SHOULDER DIAGNOSTIC OPERATIVE ARTHROSCOPY WITH SUBACROMIAL DECOMPRESSION AND DISTAL CLAVICLE EXCISION;  Surgeon: Meredith Pel, MD;  Location: [facility];  Service: Orthopedics;  Laterality: Right;  . TONSILLECTOMY    . ulner nerve       Current Medications: Outpatient Medications Prior to Visit  Medication Sig Dispense Refill  . albuterol (PROVENTIL HFA;VENTOLIN HFA) 108 (90 BASE) MCG/ACT inhaler Inhale 2 puffs into the lungs every 6 (six) hours as needed for wheezing or shortness of breath. 1 Inhaler 2  . benazepril (LOTENSIN) 10 MG tablet Take 1 tablet (10 mg total) by mouth daily. 30 tablet 11  . exenatide (BYETTA 5 MCG PEN) 5 MCG/0.02ML SOPN injection Inject 0.02 mLs (5 mcg total) into the skin 2 (two) times daily with a meal. 1.2 mL 3  . gabapentin (NEURONTIN) 300 MG capsule TAKE 3 CAPSULES BY MOUTH 3 TIMES A DAY 270 capsule 0  . metFORMIN (GLUCOPHAGE) 1000 MG tablet TAKE 1 TABLET BY MOUTH 2 TIMES DAILY WITH A MEAL. 60 tablet 3  . methocarbamol (ROBAXIN) 500 MG tablet Take 1 tablet (500 mg total) by mouth 4 (four) times daily. 30 tablet 0  . Multiple Vitamins-Minerals (WOMENS 50+ MULTI VITAMIN/MIN PO) Take 1 tablet by mouth  daily.    . nabumetone (RELAFEN) 750 MG tablet Take 750 mg by mouth 2 (two) times daily.    . nabumetone (RELAFEN) 750 MG tablet TAKE 1 TABLET (750 MG TOTAL) BY MOUTH 2 (TWO) TIMES DAILY. 60 tablet 1  . NONFORMULARY OR COMPOUNDED ITEM Apply 1-2 g topically 4 (four) times daily. 120 each 2  . oxyCODONE-acetaminophen (PERCOCET) 10-325 MG tablet Take 1-2 tablets by mouth every 6 (six) hours as needed for pain. 60 tablet 0  . oxyCODONE-acetaminophen (PERCOCET/ROXICET) 5-325 MG tablet Take by mouth.    . oxyCODONE-acetaminophen (ROXICET) 5-325 MG tablet Take 1 tablet by mouth every 4 (four) hours as needed for severe pain.  60 tablet 0  . rizatriptan (MAXALT-MLT) 10 MG disintegrating tablet Take 1 tablet (10 mg total) by mouth as needed for migraine. May repeat in 2 hours if needed 9 tablet 11  . topiramate (TOPAMAX) 50 MG tablet Take 1 tablet (50 mg total) by mouth 2 (two) times daily. 60 tablet 12  . traZODone (DESYREL) 150 MG tablet Take 2 tablets (300 mg total) by mouth at bedtime. 180 tablet 5   Facility-Administered Medications Prior to Visit  Medication Dose Route Frequency Provider Last Rate Last Dose  . betamethasone acetate-betamethasone sodium phosphate (CELESTONE) injection 3 mg  3 mg Intramuscular Once Edrick Kins, DPM         Allergies:   Shrimp [shellfish allergy]   Social History   Social History  . Marital status: Divorced    Spouse name: N/A  . Number of children: N/A  . Years of education: N/A   Social History Main Topics  . Smoking status: Never Smoker  . Smokeless tobacco: Never Used  . Alcohol use No  . Drug use: No  . Sexual activity: Not Asked   Other Topics Concern  . None   Social History Narrative  . None     Family History:  The patient's family history includes Cancer in her mother; Diabetes in her daughter and maternal grandmother; Hypertension in her maternal grandmother and mother.   ROS:   Please see the history of present illness.    Review of Systems  Constitution: Positive for malaise/fatigue.  HENT: Negative.   Eyes: Negative.   Cardiovascular: Positive for chest pain.  Respiratory: Negative.   Hematologic/Lymphatic: Negative.   Musculoskeletal: Negative.  Negative for joint pain.  Gastrointestinal: Negative.   Genitourinary: Negative.   Neurological: Positive for headaches.   All other systems reviewed and are negative.   PHYSICAL EXAM:   VS:  BP 130/78   Pulse (!) 32   Ht 5\' 6"  (1.676 m)   Wt 214 lb (97.1 kg)   LMP  (LMP Unknown)   BMI 34.54 kg/m   Physical Exam  GEN: Obese, in no acute distress  Neck: no JVD, carotid bruits, or  masses Cardiac:RRR; no murmurs, rubs, or gallops  Respiratory:  clear to auscultation bilaterally, normal work of breathing GI: soft, nontender, nondistended, + BS Ext: without cyanosis, clubbing, or edema, Good distal pulses bilaterally MS: no deformity or atrophy  Psych: Tearful  Wt Readings from Last 3 Encounters:  06/28/16 214 lb (97.1 kg)  02/15/16 214 lb (97.1 kg)  02/04/16 214 lb 12.8 oz (97.4 kg)      Studies/Labs Reviewed:   EKG:  EKG is not  ordered today.  The ekg reviewed from primary care shows normal sinus rhythm with bigeminy Recent Labs: 10/13/2015: ALT 18 02/04/2016: BUN 8; Creatinine, Ser 0.98; Hemoglobin 13.1; Platelets  154; Potassium 4.1; Sodium 138   Lipid Panel    Component Value Date/Time   CHOL 131 06/16/2014 0938   TRIG 197 (H) 06/16/2014 0938   HDL 34 (L) 06/16/2014 0938   CHOLHDL 3.9 06/16/2014 0938   VLDL 39 06/16/2014 0938   LDLCALC 58 06/16/2014 0938    Additional studies/ records that were reviewed today include:  Stress echo 11/92015 Baseline:  - The estimated LV ejection fraction was 55-60%. - Normal wall motion; no LV regional wall motion abnormalities.  Peak stress:  - The estimated LV ejection fraction was 70-75%. - Normal wall motion; no LV regional wall motion abnormalities. Study Conclusions  - Stress ECG conclusions: There were no stress arrhythmias or   conduction abnormalities. The stress ECG was normal. - Staged echo: Low/ intermediate risk stress echocardiogram   secondary to poor exercise tolerance (3:58). No ECG or ischemic   changes on echocardiogram     ASSESSMENT:    1. Ventricular bigeminy   2. Chest pain, unspecified type   3. Essential hypertension, benign   4. Controlled type 2 diabetes mellitus without complication, without long-term current use of insulin (HCC)      PLAN:  In order of problems listed above:  Ventricular bigeminy in the setting of significant stress caffeine use. Discussed this  patient in detail with Dr.Nahser. He recommends low-dose metoprolol 25 mg once daily. 2-D echo for LV function assessment. I will see her back next week. If the PVCs are suppressed and she is feeling better no further workup. If she continues to have symptoms and PVCs or LV dysfunction will need a Holter monitor and ischemic workup.  Chest pain at rest with bigeminy. Patient under significant amount of stress. No exertional symptoms. We'll add low-dose Toprol all. Full labs drawn by primary care including TSH. Will await those results. Emergency room for prolonged chest pain otherwise I'll see her back next week.  Hypertension controlled  Diabetes mellitus controlled    Medication Adjustments/Labs and Tests Ordered: Current medicines are reviewed at length with the patient today.  Concerns regarding medicines are outlined above.  Medication changes, Labs and Tests ordered today are listed in the Patient Instructions below. Patient Instructions  Medication Instructions:  Your physician has recommended you make the following change in your medication:  START Metoprolol 25mg  daily. An Rx has been sent to your pharmacy   Labwork: We have requested a copy of your lab work done today at your primary care provider's office   Testing/Procedures: Your physician has requested that you have an echocardiogram. Echocardiography is a painless test that uses sound waves to create images of your heart. It provides your doctor with information about the size and shape of your heart and how well your heart's chambers and valves are working. This procedure takes approximately one hour. There are no restrictions for this procedure.    Follow-Up: Your physician recommends that you schedule a follow-up appointment in: 1 week with Ermalinda Barrios, PA-C  Your physician recommends that you schedule a follow-up appointment in: 4 weeks with Dr.Varanasi    Any Other Special Instructions Will Be Listed Below (If  Applicable).     If you need a refill on your cardiac medications before your next appointment, please call your pharmacy.      Sumner Boast, PA-C  06/28/2016 3:23 PM    [facility] [employer], [place], [place]  [address] Phone: [phone]; Fax: [phone]

## 2016-06-30 ENCOUNTER — Other Ambulatory Visit: Payer: Self-pay

## 2016-06-30 ENCOUNTER — Ambulatory Visit (HOSPITAL_COMMUNITY): Payer: Medicaid Other | Attending: Cardiovascular Disease

## 2016-06-30 DIAGNOSIS — I499 Cardiac arrhythmia, unspecified: Secondary | ICD-10-CM | POA: Diagnosis not present

## 2016-06-30 DIAGNOSIS — E785 Hyperlipidemia, unspecified: Secondary | ICD-10-CM | POA: Insufficient documentation

## 2016-06-30 DIAGNOSIS — I119 Hypertensive heart disease without heart failure: Secondary | ICD-10-CM | POA: Insufficient documentation

## 2016-06-30 DIAGNOSIS — Z6834 Body mass index (BMI) 34.0-34.9, adult: Secondary | ICD-10-CM | POA: Insufficient documentation

## 2016-06-30 DIAGNOSIS — E119 Type 2 diabetes mellitus without complications: Secondary | ICD-10-CM | POA: Diagnosis not present

## 2016-06-30 DIAGNOSIS — I498 Other specified cardiac arrhythmias: Secondary | ICD-10-CM

## 2016-07-04 ENCOUNTER — Ambulatory Visit (INDEPENDENT_AMBULATORY_CARE_PROVIDER_SITE_OTHER): Payer: Medicaid Other | Admitting: Podiatry

## 2016-07-04 DIAGNOSIS — G5791 Unspecified mononeuropathy of right lower limb: Secondary | ICD-10-CM

## 2016-07-04 DIAGNOSIS — G5751 Tarsal tunnel syndrome, right lower limb: Secondary | ICD-10-CM | POA: Diagnosis not present

## 2016-07-04 DIAGNOSIS — Z9889 Other specified postprocedural states: Secondary | ICD-10-CM

## 2016-07-05 ENCOUNTER — Ambulatory Visit: Payer: Medicaid Other | Admitting: Physician Assistant

## 2016-07-05 ENCOUNTER — Telehealth: Payer: Self-pay | Admitting: Podiatry

## 2016-07-05 DIAGNOSIS — I499 Cardiac arrhythmia, unspecified: Secondary | ICD-10-CM

## 2016-07-05 DIAGNOSIS — I498 Other specified cardiac arrhythmias: Secondary | ICD-10-CM | POA: Insufficient documentation

## 2016-07-05 MED ORDER — METHYLPREDNISOLONE 4 MG PO TBPK: ORAL_TABLET | ORAL | 0 refills | Status: DC

## 2016-07-05 NOTE — Progress Notes (Deleted)
Cardiology Office Note    Date:  07/05/2016   ID:  Briana Alvarez, DOB April 06, 1961, MRN [id number]  PCP:  Vicenta Aly, FNP  Cardiologist: Dr. Irish Lack  No chief complaint on file.   History of Present Illness:  Briana Alvarez is a 56 y.o. female with history of hypertension since she was 56 years old, hyperlipidemia and diabetes mellitus. She previously had a cath in [place] for chest pain, abnormal EKG and syncope 13 years ago that was normal. She had chest pain in 03/2014 with negative workup in the ER and negative stress echo test. Last seen here 07/25/14 with elevated blood pressures 200/108. Atenolol stopped and she was started on benazepril 10 mg daily.  Patient was added onto my schedule 06/28/16 for chest pain in bigeminy on EKG. She was complaining of chest heaviness and shortness of breath. It occurred twice a day for the past 2 weeks usually at rest after a stressful situation. She has temporary custody of her son's kids and he moved back.  Dr.Nahser recommended low dose metoprolol 25 mg once daily and a 2-D echo. She is here today for follow-up. If she continued to have symptoms in PVCs or LV dysfunction she will need a Holter monitor and ischemic workup. 2-D echo showed normal LVEF 55-60% trivial AI mildly dilated right atrium. Systolic pressure was mildly to moderately increased and the pulmonary arteries PA peak pressure 40 mmHg.     Past Medical History:  Diagnosis Date  . Anxiety   . Arthritis   . Depression   . Diabetes mellitus   . Headache    "2 a month"  . Hypertension   . Neuropathy ([place])   . Pneumonia     Past Surgical History:  Procedure Laterality Date  . ABDOMINAL HYSTERECTOMY    . CARPAL TUNNEL RELEASE    . KNEE ARTHROSCOPY Right   . KNEE SURGERY Left   . NECK SURGERY    . SHOULDER ARTHROSCOPY    . SHOULDER ARTHROSCOPY WITH SUBACROMIAL DECOMPRESSION AND BICEP TENDON REPAIR Right 02/15/2016   Procedure: SHOULDER DIAGNOSTIC OPERATIVE ARTHROSCOPY WITH  SUBACROMIAL DECOMPRESSION AND DISTAL CLAVICLE EXCISION;  Surgeon: Meredith Pel, MD;  Location: [facility];  Service: Orthopedics;  Laterality: Right;  . TONSILLECTOMY    . ulner nerve       Current Medications: Outpatient Medications Prior to Visit  Medication Sig Dispense Refill  . albuterol (PROVENTIL HFA;VENTOLIN HFA) 108 (90 BASE) MCG/ACT inhaler Inhale 2 puffs into the lungs every 6 (six) hours as needed for wheezing or shortness of breath. 1 Inhaler 2  . benazepril (LOTENSIN) 10 MG tablet Take 1 tablet (10 mg total) by mouth daily. 30 tablet 11  . exenatide (BYETTA 5 MCG PEN) 5 MCG/0.02ML SOPN injection Inject 0.02 mLs (5 mcg total) into the skin 2 (two) times daily with a meal. 1.2 mL 3  . gabapentin (NEURONTIN) 300 MG capsule TAKE 3 CAPSULES BY MOUTH 3 TIMES A DAY 270 capsule 0  . metFORMIN (GLUCOPHAGE) 1000 MG tablet TAKE 1 TABLET BY MOUTH 2 TIMES DAILY WITH A MEAL. 60 tablet 3  . methocarbamol (ROBAXIN) 500 MG tablet Take 1 tablet (500 mg total) by mouth 4 (four) times daily. 30 tablet 0  . metoprolol succinate (TOPROL XL) 25 MG 24 hr tablet Take 1 tablet (25 mg total) by mouth daily. 30 tablet 11  . Multiple Vitamins-Minerals (WOMENS 50+ MULTI VITAMIN/MIN PO) Take 1 tablet by mouth daily.    . nabumetone (RELAFEN) 750 MG tablet Take  750 mg by mouth 2 (two) times daily.    . nabumetone (RELAFEN) 750 MG tablet TAKE 1 TABLET (750 MG TOTAL) BY MOUTH 2 (TWO) TIMES DAILY. 60 tablet 1  . NONFORMULARY OR COMPOUNDED ITEM Apply 1-2 g topically 4 (four) times daily. 120 each 2  . oxyCODONE-acetaminophen (PERCOCET) 10-325 MG tablet Take 1-2 tablets by mouth every 6 (six) hours as needed for pain. 60 tablet 0  . oxyCODONE-acetaminophen (PERCOCET/ROXICET) 5-325 MG tablet Take by mouth.    . oxyCODONE-acetaminophen (ROXICET) 5-325 MG tablet Take 1 tablet by mouth every 4 (four) hours as needed for severe pain. 60 tablet 0  . rizatriptan (MAXALT-MLT) 10 MG disintegrating tablet Take 1 tablet (10 mg  total) by mouth as needed for migraine. May repeat in 2 hours if needed 9 tablet 11  . topiramate (TOPAMAX) 50 MG tablet Take 1 tablet (50 mg total) by mouth 2 (two) times daily. 60 tablet 12  . traZODone (DESYREL) 150 MG tablet Take 2 tablets (300 mg total) by mouth at bedtime. 180 tablet 5   Facility-Administered Medications Prior to Visit  Medication Dose Route Frequency Provider Last Rate Last Dose  . betamethasone acetate-betamethasone sodium phosphate (CELESTONE) injection 3 mg  3 mg Intramuscular Once Edrick Kins, DPM         Allergies:   Shrimp [shellfish allergy]   Social History   Social History  . Marital status: Divorced    Spouse name: N/A  . Number of children: N/A  . Years of education: N/A   Social History Main Topics  . Smoking status: Never Smoker  . Smokeless tobacco: Never Used  . Alcohol use No  . Drug use: No  . Sexual activity: Not on file   Other Topics Concern  . Not on file   Social History Narrative  . No narrative on file     Family History:  The patient's   family history includes Cancer in her mother; Diabetes in her daughter and maternal grandmother; Hypertension in her maternal grandmother and mother.   ROS:   Please see the history of present illness.    Review of Systems  Constitution: Negative.  HENT: Negative.   Eyes: Negative.   Cardiovascular: Negative.   Respiratory: Negative.   Hematologic/Lymphatic: Negative.   Musculoskeletal: Negative.  Negative for joint pain.  Gastrointestinal: Negative.   Genitourinary: Negative.   Neurological: Negative.    All other systems reviewed and are negative.   PHYSICAL EXAM:   VS:  LMP  (LMP Unknown)   Physical Exam  GEN: Well nourished, well developed, in no acute distress  HEENT: normal  Neck: no JVD, carotid bruits, or masses Cardiac:RRR; no murmurs, rubs, or gallops  Respiratory:  clear to auscultation bilaterally, normal work of breathing GI: soft, nontender, nondistended, +  BS Ext: without cyanosis, clubbing, or edema, Good distal pulses bilaterally MS: no deformity or atrophy  Skin: warm and dry, no rash Neuro:  Alert and Oriented x 3, Strength and sensation are intact Psych: euthymic mood, full affect  Wt Readings from Last 3 Encounters:  06/28/16 214 lb (97.1 kg)  02/15/16 214 lb (97.1 kg)  02/04/16 214 lb 12.8 oz (97.4 kg)      Studies/Labs Reviewed:   EKG:  EKG is*** ordered today.  The ekg ordered today demonstrates ***  Recent Labs: 10/13/2015: ALT 18 02/04/2016: BUN 8; Creatinine, Ser 0.98; Hemoglobin 13.1; Platelets 154; Potassium 4.1; Sodium 138   Lipid Panel    Component Value Date/Time  CHOL 131 06/16/2014 0938   TRIG 197 (H) 06/16/2014 0938   HDL 34 (L) 06/16/2014 0938   CHOLHDL 3.9 06/16/2014 0938   VLDL 39 06/16/2014 0938   LDLCALC 58 06/16/2014 0938    Additional studies/ records that were reviewed today include:  2-D echo 1/11/18Study Conclusions   - Left ventricle: The cavity size was normal. Wall thickness was   normal. Systolic function was normal. The estimated ejection   fraction was in the range of 55% to 60%. Wall motion was normal;   there were no regional wall motion abnormalities. - Aortic valve: There was trivial regurgitation. - Right atrium: The atrium was mildly dilated. - Pulmonary arteries: Systolic pressure was mildly to moderately   increased. PA peak pressure: 40 mm Hg (S).       ASSESSMENT:    No diagnosis found.   PLAN:  In order of problems listed above:      Medication Adjustments/Labs and Tests Ordered: Current medicines are reviewed at length with the patient today.  Concerns regarding medicines are outlined above.  Medication changes, Labs and Tests ordered today are listed in the Patient Instructions below. There are no Patient Instructions on file for this visit.   Sumner Boast, PA-C  07/05/2016 10:51 AM    [facility] [place],  [place], [place]  [address] Phone: [phone]; Fax: [phone]

## 2016-07-05 NOTE — Telephone Encounter (Signed)
Please contact patient and let her know her prescription has been sent to her pharmacy.  Thanks, Dr. Amalia Hailey

## 2016-07-05 NOTE — Telephone Encounter (Signed)
Pt said you were supposed to send a RX over to her Pharmacy yesterday and they have not received it.

## 2016-07-05 NOTE — Telephone Encounter (Signed)
Sorry, I thought we sent it in. Please contact patient and let her know her prescription has been sent to her pharmacy. I already placed the order.  Thanks, Dr. Amalia Hailey

## 2016-07-06 NOTE — Progress Notes (Signed)
Subjective: Patient presents today status post EPF surgery to the bilateral foot. Date of surgery 03/10/2016. He states that she is still having a lot of pain with the right foot more symptomatic than the left.  Objective: Skin incisions are well coapted and healed. No sign of infectious process. Pain and tenderness to palpation overlying the tarsal tunnel of the right foot. Positive Tinel sign noted with percussion posterior tibial neurovascular bundle. Percussion of the posterior tibial nerve sends radiating sensations down the medial aspect of the patient's right foot which is very sensitive to percussion.  Negative for pain on palpation to the medial calcaneal tubercle the right foot at the insertion of the plantar fascia.  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

## 2016-07-07 ENCOUNTER — Ambulatory Visit: Payer: Medicaid Other | Admitting: Cardiology

## 2016-07-11 ENCOUNTER — Ambulatory Visit: Payer: Medicaid Other | Admitting: Podiatry

## 2016-07-12 ENCOUNTER — Encounter: Payer: Self-pay | Admitting: Physician Assistant

## 2016-07-13 ENCOUNTER — Encounter: Payer: Self-pay | Admitting: Interventional Cardiology

## 2016-07-18 ENCOUNTER — Ambulatory Visit: Payer: Medicaid Other | Admitting: Physician Assistant

## 2016-07-19 ENCOUNTER — Telehealth: Payer: Self-pay | Admitting: *Deleted

## 2016-07-19 NOTE — Telephone Encounter (Addendum)
Pt states she received an injection, and medications at last visit and nothing has helped, is there anything else to do. 07/20/2016-Pt called again for a change of medication. Left message informing pt it would be to her benefit to make an appt to be seen sooner than 2 weeks from now and that Dr. Amalia Hailey would be out of the office of the rest of week. 07/28/2016-Pt's pharmacy requested refill of Nabumetone.09/30/2016-[facility] request refill of Nabumetone, Dr. Amalia Hailey okayed refill once. 11/23/2016-Pt states would like refill of the medication that starts with "N", and it was previously ordered for 2 x a day, her feet are killing her and she is scheduled to see Dr. Amalia Hailey 11/28/2016. Dr. Amalia Hailey states okay to refill as 07/29/2016. I informed pt that Dr. Trinidad Curet and to ask for a additional script at the next visit.

## 2016-07-22 ENCOUNTER — Ambulatory Visit (INDEPENDENT_AMBULATORY_CARE_PROVIDER_SITE_OTHER): Payer: Medicaid Other | Admitting: Podiatry

## 2016-07-22 ENCOUNTER — Ambulatory Visit (INDEPENDENT_AMBULATORY_CARE_PROVIDER_SITE_OTHER): Payer: Medicaid Other

## 2016-07-22 ENCOUNTER — Encounter: Payer: Self-pay | Admitting: Podiatry

## 2016-07-22 DIAGNOSIS — M792 Neuralgia and neuritis, unspecified: Secondary | ICD-10-CM | POA: Diagnosis not present

## 2016-07-22 DIAGNOSIS — G5751 Tarsal tunnel syndrome, right lower limb: Secondary | ICD-10-CM

## 2016-07-22 MED ORDER — TRAMADOL HCL 50 MG PO TABS: 50.0000 mg | ORAL_TABLET | Freq: Three times a day (TID) | ORAL | 2 refills | Status: DC

## 2016-07-22 NOTE — Progress Notes (Signed)
   Subjective:    Patient ID: Briana Alvarez, female    DOB: [birth year], 56 y.o.   MRN: [id number]  HPI  Chief Complaint  Patient presents with  . Foot Pain    Right; "feels a knot in arch of foot and has pain that radiates from all over the bottom of right foot up to the toes". Left; Medial side "having pain all over bottom of foot radiaing to toes" x "been having pain for a long time, injections, medrol dose paks, and methocarbamol are not helping with pain"       Review of Systems     Objective:   Physical Exam        Assessment & Plan:

## 2016-07-23 NOTE — Progress Notes (Signed)
Subjective:     Patient ID: Briana Alvarez, female   DOB: 1960-10-09, 56 y.o.   MRN: [id number]  HPI patient presents stating she continues to have pain in her arch bilateral and her heel pain is resolved secondary to endoscopic surgery by Dr. Amalia Hailey but she does still have pain in general   Review of Systems     Objective:   Physical Exam Neurovascular status is found to be intact with no indications of chronic pain syndrome or indications of mottled skin or coolness to the feet. There is discomfort in the mid arch area bilateral localized in nature with no indications of organic cause with incision sites that if healed well bilateral and minimal equinus condition noted    Assessment:     Difficult to make complete determination but patient may be dealing with some kind of chronic pain syndrome of the low grade or some kind of systemic inflammatory condition or fibromyalgia    Plan:     H&P condition reviewed. Patient takes 2700 mg of gabapentin at this time and I do not recommend increasing this and we'll get a try tramadol to see if this will reduce inflamed tissue along with heat ice therapy and patient will be seen by Dr. Amalia Hailey in 1 month

## 2016-07-25 ENCOUNTER — Emergency Department (HOSPITAL_COMMUNITY): Payer: Medicaid Other

## 2016-07-25 ENCOUNTER — Emergency Department (HOSPITAL_COMMUNITY)
Admission: EM | Admit: 2016-07-25 | Discharge: 2016-07-25 | Disposition: A | Discharge status: Home / Self Care | Payer: Medicaid Other | Attending: Emergency Medicine | Admitting: Emergency Medicine

## 2016-07-25 ENCOUNTER — Encounter (HOSPITAL_COMMUNITY): Payer: Self-pay | Admitting: Emergency Medicine

## 2016-07-25 DIAGNOSIS — Z79899 Other long term (current) drug therapy: Secondary | ICD-10-CM | POA: Diagnosis not present

## 2016-07-25 DIAGNOSIS — I10 Essential (primary) hypertension: Secondary | ICD-10-CM | POA: Insufficient documentation

## 2016-07-25 DIAGNOSIS — E1142 Type 2 diabetes mellitus with diabetic polyneuropathy: Secondary | ICD-10-CM | POA: Insufficient documentation

## 2016-07-25 DIAGNOSIS — Z7984 Long term (current) use of oral hypoglycemic drugs: Secondary | ICD-10-CM | POA: Insufficient documentation

## 2016-07-25 DIAGNOSIS — R1012 Left upper quadrant pain: Secondary | ICD-10-CM | POA: Diagnosis present

## 2016-07-25 DIAGNOSIS — N132 Hydronephrosis with renal and ureteral calculous obstruction: Secondary | ICD-10-CM | POA: Insufficient documentation

## 2016-07-25 DIAGNOSIS — N200 Calculus of kidney: Secondary | ICD-10-CM

## 2016-07-25 LAB — COMPREHENSIVE METABOLIC PANEL WITH GFR
ALT: 20 U/L (ref 14–54)
AST: 26 U/L (ref 15–41)
Albumin: 3.5 g/dL (ref 3.5–5.0)
Alkaline Phosphatase: 75 U/L (ref 38–126)
Anion gap: 9 (ref 5–15)
BUN: 25 mg/dL — ABNORMAL HIGH (ref 6–20)
CO2: 24 mmol/L (ref 22–32)
Calcium: 9.3 mg/dL (ref 8.9–10.3)
Chloride: 107 mmol/L (ref 101–111)
Creatinine, Ser: 1.27 mg/dL — ABNORMAL HIGH (ref 0.44–1.00)
GFR calc Af Amer: 54 mL/min — ABNORMAL LOW (ref >60)
GFR calc non Af Amer: 46 mL/min — ABNORMAL LOW (ref >60)
Glucose, Bld: 125 mg/dL — ABNORMAL HIGH (ref 65–99)
Potassium: 4.1 mmol/L (ref 3.5–5.1)
Sodium: 140 mmol/L (ref 135–145)
Total Bilirubin: 0.5 mg/dL (ref 0.3–1.2)
Total Protein: 5.6 g/dL — ABNORMAL LOW (ref 6.5–8.1)

## 2016-07-25 LAB — URINALYSIS, ROUTINE W REFLEX MICROSCOPIC
Bilirubin Urine: NEGATIVE
Glucose, UA: NEGATIVE mg/dL
KETONES UR: NEGATIVE mg/dL
Nitrite: NEGATIVE
PH: 5 (ref 5.0–8.0)
PROTEIN: NEGATIVE mg/dL
Specific Gravity, Urine: 1.021 (ref 1.005–1.030)

## 2016-07-25 LAB — CBC
HCT: 41 % (ref 36.0–46.0)
HEMOGLOBIN: 13.4 g/dL (ref 12.0–15.0)
MCH: 32.1 pg (ref 26.0–34.0)
MCHC: 32.7 g/dL (ref 30.0–36.0)
MCV: 98.3 fL (ref 78.0–100.0)
Platelets: 124 10*3/uL — ABNORMAL LOW (ref 150–400)
RBC: 4.17 MIL/uL (ref 3.87–5.11)
RDW: 14 % (ref 11.5–15.5)
WBC: 7.6 10*3/uL (ref 4.0–10.5)

## 2016-07-25 LAB — LIPASE, BLOOD: Lipase: 38 U/L (ref 11–51)

## 2016-07-25 MED ORDER — OXYCODONE-ACETAMINOPHEN 5-325 MG PO TABS: 1.0000 | ORAL_TABLET | Freq: Four times a day (QID) | ORAL | 0 refills | Status: DC | PRN

## 2016-07-25 NOTE — ED Triage Notes (Signed)
crampy like abd pain since sat and blood in her urine also, that cleared on Sunday but abd pain remained , lower abd pain worse on left

## 2016-07-25 NOTE — ED Notes (Signed)
Pt verbalized understanding discharge instructions and denies any further needs or questions at this time. VS stable, ambulatory and steady gait.   

## 2016-07-25 NOTE — ED Notes (Signed)
Charge RN aware of need of room d/t HR 30, pt A&O x4, follows commands, speaks in complete sentences

## 2016-07-25 NOTE — ED Provider Notes (Signed)
[facility] DEPT Provider Note   CSN: [id number] Arrival date & time: 07/25/16  1132     History   Chief Complaint Chief Complaint  Patient presents with  . Abdominal Pain    HPI Briana Alvarez is a 56 y.o. female.  HPI 56 year old female with a history of anxiety, depression, diabetes, hypertension and ventricular bigeminy being seen by cardiology presenting with abdominal pain. She states that her abdominal pain started as a crampy left upper quadrant pain on Saturday associated with hematuria. She states that Sunday she had slight pain but it was minimal and her urine had cleared. She states that when she woke up this morning she was having severe cramping left flank pain. She denies any radiation of the pain. She states she no longer has hematuria. She endorses slight nausea but has not vomited. Denies diarrhea, fevers or chest pain, shortness of breath. She denies vaginal bleeding or discharge. She was placed in a room quickly due to having a pulse in the 30s however she states that she is being seen by cardiology for an abnormal heart rate and was placed on a new medicine that she does not the name of 3 weeks ago and is supposed to be seeing him again next week. She denies lightheadedness, dizziness, chest pain, shortness of breath and has been ambulating without difficulty.  Past Medical History:  Diagnosis Date  . Anxiety   . Arthritis   . Depression   . Diabetes mellitus   . Headache    "2 a month"  . Hypertension   . Neuropathy ([place])   . Pneumonia     Patient Active Problem List   Diagnosis Date Noted  . 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 Chest pain 06/28/2016  . Daytime hypersomnia 01/11/2016  . Morbid obesity ([place]) 08/27/2015  . Left low back pain 08/27/2015  . Diabetic peripheral neuropathy associated with type 2 diabetes mellitus ([place]) 04/10/2015  . Lumbar radiculopathy, chronic 02/18/2014  . Hypertriglyceridemia 01/08/2014  . Diabetes type 2, controlled  ([place]) 07/12/2013  . Insomnia 07/12/2013  . Depression 07/12/2013  . Essential hypertension, benign 07/12/2013  . 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 Ankle sprain 11/30/2012  . Bipolar affective disorder ([place]) 10/12/2012  . BP (high blood pressure) 10/12/2012  . Diabetes mellitus ([place]) 10/12/2012    Past Surgical History:  Procedure Laterality Date  . ABDOMINAL HYSTERECTOMY    . CARPAL TUNNEL RELEASE    . KNEE ARTHROSCOPY Right   . KNEE SURGERY Left   . NECK SURGERY    . SHOULDER ARTHROSCOPY    . SHOULDER ARTHROSCOPY WITH SUBACROMIAL DECOMPRESSION AND BICEP TENDON REPAIR Right 02/15/2016   Procedure: SHOULDER DIAGNOSTIC OPERATIVE ARTHROSCOPY WITH SUBACROMIAL DECOMPRESSION AND DISTAL CLAVICLE EXCISION;  Surgeon: Meredith Pel, MD;  Location: [facility];  Service: Orthopedics;  Laterality: Right;  . TONSILLECTOMY    . ulner nerve       OB History    No data available       Home Medications    Prior to Admission medications   Medication Sig Start Date End Date Taking? Authorizing Provider  albuterol (PROVENTIL HFA;VENTOLIN HFA) 108 (90 BASE) MCG/ACT inhaler Inhale 2 puffs into the lungs every 6 (six) hours as needed for wheezing or shortness of breath. 05/09/14  Yes Gregor Hams, MD  amoxicillin (AMOXIL) 500 MG capsule Take 500 mg by mouth 2 (two) times daily. 07/14/16  Yes Historical Provider, MD  benazepril (LOTENSIN) 10 MG tablet Take 1 tablet (10 mg  total) by mouth daily. 12/16/14  Yes Olugbemiga Essie Christine, MD  exenatide (BYETTA 5 MCG PEN) 5 MCG/0.02ML SOPN injection Inject 0.02 mLs (5 mcg total) into the skin 2 (two) times daily with a meal. 08/27/15  Yes Josalyn Funches, MD  gabapentin (NEURONTIN) 300 MG capsule TAKE 3 CAPSULES BY MOUTH 3 TIMES A DAY 12/14/15  Yes Josalyn Funches, MD  metFORMIN (GLUCOPHAGE) 1000 MG tablet TAKE 1 TABLET BY MOUTH 2 TIMES DAILY WITH A MEAL. 09/01/15  Yes Josalyn Funches, MD  methocarbamol (ROBAXIN) 500 MG tablet Take 1 tablet (500 mg total)  by mouth 4 (four) times daily. 02/15/16  Yes [name] Marcene Duos, MD  metoprolol succinate (TOPROL XL) 25 MG 24 hr tablet Take 1 tablet (25 mg total) by mouth daily. 06/28/16  Yes Imogene Burn, PA-C  Multiple Vitamins-Minerals (WOMENS 50+ MULTI VITAMIN/MIN PO) Take 1 tablet by mouth daily.   Yes Historical Provider, MD  nabumetone (RELAFEN) 750 MG tablet Take 750 mg by mouth 2 (two) times daily.   Yes Edrick Kins, DPM  nabumetone (RELAFEN) 750 MG tablet TAKE 1 TABLET (750 MG TOTAL) BY MOUTH 2 (TWO) TIMES DAILY. 05/18/16  Yes Edrick Kins, DPM  NONFORMULARY OR COMPOUNDED ITEM Apply 1-2 g topically 4 (four) times daily. 05/02/16  Yes Edrick Kins, DPM  rizatriptan (MAXALT-MLT) 10 MG disintegrating tablet Take 1 tablet (10 mg total) by mouth as needed for migraine. May repeat in 2 hours if needed 10/16/15  Yes Penni Bombard, MD  topiramate (TOPAMAX) 50 MG tablet Take 1 tablet (50 mg total) by mouth 2 (two) times daily. 10/16/15  Yes Penni Bombard, MD  traMADol (ULTRAM) 50 MG tablet Take 1 tablet (50 mg total) by mouth 3 (three) times daily. 07/22/16  Yes Wallene Huh, DPM  traZODone (DESYREL) 150 MG tablet Take 2 tablets (300 mg total) by mouth at bedtime. 11/09/15  Yes Josalyn Funches, MD  methylPREDNISolone (MEDROL DOSEPAK) 4 MG TBPK tablet 6 day dose pack - take as directed Patient not taking: Reported on 07/25/2016 07/05/16   Edrick Kins, DPM  oxyCODONE-acetaminophen (PERCOCET/ROXICET) 5-325 MG tablet Take 1 tablet by mouth every 6 (six) hours as needed for severe pain. 07/25/16   Gricel Copen Mali Kavitha Lansdale, MD    Family History Family History  Problem Relation Age of Onset  . Cancer Mother   . Hypertension Mother   . Diabetes Daughter   . Diabetes Maternal Grandmother   . Hypertension Maternal Grandmother   . Heart attack Neg Hx   . Stroke Neg Hx     Social History Social History  Substance Use Topics  . Smoking status: Never Smoker  . Smokeless tobacco: Never Used  . Alcohol use No      Allergies   Shrimp [shellfish allergy]   Review of Systems Review of Systems  Constitutional: Negative for chills and fever.  HENT: Negative for ear pain and sore throat.   Eyes: Negative for pain and visual disturbance.  Respiratory: Negative for cough and shortness of breath.   Cardiovascular: Negative for chest pain and palpitations.  Gastrointestinal: Positive for abdominal pain. Negative for vomiting.  Genitourinary: Positive for hematuria. Negative for dysuria.  Musculoskeletal: Negative for arthralgias, back pain and neck pain.  Skin: Negative for color change and rash.  Neurological: Negative for dizziness, seizures, syncope and light-headedness.  All other systems reviewed and are negative.    Physical Exam Updated Vital Signs BP 161/85   Pulse (!) 32   Temp  42 F (36.7 C) (Oral)   Resp 15   Ht 5\' 6"  (1.676 m)   Wt 95.7 kg   LMP  (LMP Unknown)   SpO2 97%   BMI 34.06 kg/m   Physical Exam  Constitutional: She is oriented to person, place, and time. She appears well-developed and well-nourished. No distress.  HENT:  Head: Normocephalic and atraumatic.  Eyes: Conjunctivae are normal.  Neck: Normal range of motion. Neck supple.  Cardiovascular: S1 normal, S2 normal, normal heart sounds, intact distal pulses and normal pulses.  An irregular rhythm present. Bradycardia present.  Exam reveals no gallop and no friction rub.   No murmur heard. Pulmonary/Chest: Effort normal and breath sounds normal. No respiratory distress. She has no decreased breath sounds. She has no rhonchi.  Abdominal: Soft. She exhibits no distension. There is tenderness in the epigastric area and left upper quadrant. There is CVA tenderness (Left). There is no rigidity, no guarding, no tenderness at McBurney's point and negative Murphy's sign.  Musculoskeletal: She exhibits no edema.  Neurological: She is alert and oriented to person, place, and time.  Skin: Skin is warm and dry. Capillary  refill takes less than 2 seconds.  Psychiatric: She has a normal mood and affect.  Nursing note and vitals reviewed.    ED Treatments / Results  Labs (all labs ordered are listed, but only abnormal results are displayed) Labs Reviewed  COMPREHENSIVE METABOLIC PANEL - Abnormal; Notable for the following:       Result Value   Glucose, Bld 125 (*)    BUN 25 (*)    Creatinine, Ser 1.27 (*)    Total Protein 5.6 (*)    GFR calc non Af Amer 46 (*)    GFR calc Af Amer 54 (*)    All other components within normal limits  CBC - Abnormal; Notable for the following:    Platelets 124 (*)    All other components within normal limits  URINALYSIS, ROUTINE W REFLEX MICROSCOPIC - Abnormal; Notable for the following:    APPearance HAZY (*)    Hgb urine dipstick MODERATE (*)    Leukocytes, UA TRACE (*)    Bacteria, UA FEW (*)    Squamous Epithelial / LPF 0-5 (*)    All other components within normal limits  LIPASE, BLOOD  CBG MONITORING, ED    EKG  EKG Interpretation  Date/Time:  Monday July 25 2016 13:57:56 EST Ventricular Rate:  76 PR Interval:    QRS Duration: 92 QT Interval:  429 QTC Calculation: 450 R Axis:   -11 Text Interpretation:  Sinus rhythm with frequent Premature ventricular complexes Probable left atrial enlargement Baseline wander in lead(s) V1 V4 Non-specific ST-t changes Confirmed by Ashok Cordia  MD, Lennette Bihari ([id number]) on 07/25/2016 5:39:55 PM       Radiology Ct Renal Stone Study  Result Date: 07/25/2016 CLINICAL DATA:  Left-sided abdominal pain and nausea over the last 4 days. EXAM: CT ABDOMEN AND PELVIS WITHOUT CONTRAST TECHNIQUE: Multidetector CT imaging of the abdomen and pelvis was performed following the standard protocol without IV contrast. COMPARISON:  Radiography 09/02/2015 FINDINGS: Lower chest: Lung bases are clear.  No pleural or pericardial fluid. Hepatobiliary: Normal without contrast.  No calcified gallstones. Pancreas: Normal Spleen: Normal Adrenals/Urinary  Tract: Adrenal glands are normal. Right kidney is normal. No cyst, mass, stone or hydronephrosis. Left kidney shows mild fullness of the renal collecting system an ureter. There is a 2 mm stone at the left UVJ. No stone in the  bladder. Stomach/Bowel: Normal Vascular/Lymphatic: Normal Reproductive: Previous hysterectomy.  No pelvic mass. Other: No free fluid or air. Musculoskeletal: Chronic lower lumbar degenerative changes. IMPRESSION: 2 mm stone at the left UVJ with mild left hydronephrosis. No other urinary tract stone identified. Electronically Signed   By: Nelson Chimes M.D.   On: 07/25/2016 17:14    Procedures Procedures (including critical care time)  Medications Ordered in ED Medications - No data to display   Initial Impression / Assessment and Plan / ED Course  I have reviewed the triage vital signs and the nursing notes.  Pertinent labs & imaging results that were available during my care of the patient were reviewed by me and considered in my medical decision making (see chart for details).    55yoF presenting with L flank pain and hematuria. Found to be in bigemini with a palpable pulse in the 30s-40. Pt states this is not new and she has been seeing cardiology for it and has an appointment with them this week. She denies Cp, SOB, lightheadedness, dizziness, or near-syncope. As pt is asymptomatic, I have instructed her to f/u with the cardiologist for re-evaluation.  Exam notable for L flank and LUQ TTP. Hematuria on Ua. CT shows 80mm stone with mild hydro at UPJ. No other acute findings on Ct. Pt afebrile with no signs of infection on Ua. Will send home with pain medicine and f/u with pcp. Strict return precautions given and pt discharged in good condition.  Patient care discussed and supervised by my attending, Dr. Ashok Cordia. Drucie Ip, MD   Final Clinical Impressions(s) / ED Diagnoses   Final diagnoses:  Kidney stone    New Prescriptions Discharge Medication List as of  07/25/2016  8:06 PM       Kazuko Clemence Mali Ruby Logiudice, MD 07/25/16 [place], MD 07/25/16 [phone]

## 2016-07-26 ENCOUNTER — Other Ambulatory Visit: Payer: Self-pay | Admitting: Nurse Practitioner

## 2016-07-26 DIAGNOSIS — Z1231 Encounter for screening mammogram for malignant neoplasm of breast: Secondary | ICD-10-CM

## 2016-07-26 NOTE — Progress Notes (Signed)
Cardiology Office Note   Date:  07/28/2016   ID:  Briana Alvarez, DOB 1961/03/29, MRN [id number]  PCP:  Vicenta Aly, FNP  Dr. Melford Aase  No chief complaint on file.  PVCs  Wt Readings from Last 3 Encounters:  07/28/16 211 lb (95.7 kg)  07/25/16 211 lb (95.7 kg)  06/28/16 214 lb (97.1 kg)       History of Present Illness: Briana Alvarez is a 56 y.o. female  57 y/o who has HTN ,hyperlipidemia and DM.  She was 60 when diagnosed with HTN.  She has been taking medicine or many years.  She lived in [place] and had syncope in the early 2000s, followed by a clean cath at that time due to an abnormal ECG.  She was having chest pressure at that time.   More recently,  She had chest pain in 10/15.  She went to the ER and had a negative w/u and was sent home.  Since that time, no recurrent sx.  She had a negative stress in 11/15.    No further chest pain since that time.  SHe had been walking regularly- but not for dedicated exercise. She has some left shoulder pain with moving it a certain way.   She has had difficult to control BP.   She was seen for Ventricular bigeminy in the setting of significant stress and caffeine use.  Metoprolol 25 mg once daily was started. 2-D echo for LV function assessment was done and was essentially normal.    She has issues with pain in her feet.  She has migraines and takes several meds for that.    She continues to feel fatigued. She still has stress.  She is now passing kidney stones.  BP has been as high as 160/86 range.  It is typically high.     Past Medical History:  Diagnosis Date  . Anxiety   . Arthritis   . Depression   . Diabetes mellitus   . Headache    "2 a month"  . Hypertension   . Neuropathy ([place])   . Pneumonia     Past Surgical History:  Procedure Laterality Date  . ABDOMINAL HYSTERECTOMY    . CARPAL TUNNEL RELEASE    . KNEE ARTHROSCOPY Right   . KNEE SURGERY Left   . NECK SURGERY    . SHOULDER ARTHROSCOPY    .  SHOULDER ARTHROSCOPY WITH SUBACROMIAL DECOMPRESSION AND BICEP TENDON REPAIR Right 02/15/2016   Procedure: SHOULDER DIAGNOSTIC OPERATIVE ARTHROSCOPY WITH SUBACROMIAL DECOMPRESSION AND DISTAL CLAVICLE EXCISION;  Surgeon: Meredith Pel, MD;  Location: [facility];  Service: Orthopedics;  Laterality: Right;  . TONSILLECTOMY    . ulner nerve        Current Outpatient Prescriptions  Medication Sig Dispense Refill  . albuterol (PROVENTIL HFA;VENTOLIN HFA) 108 (90 BASE) MCG/ACT inhaler Inhale 2 puffs into the lungs every 6 (six) hours as needed for wheezing or shortness of breath. 1 Inhaler 2  . benazepril (LOTENSIN) 10 MG tablet Take 1 tablet (10 mg total) by mouth daily. 30 tablet 11  . exenatide (BYETTA 5 MCG PEN) 5 MCG/0.02ML SOPN injection Inject 0.02 mLs (5 mcg total) into the skin 2 (two) times daily with a meal. 1.2 mL 3  . gabapentin (NEURONTIN) 300 MG capsule TAKE 3 CAPSULES BY MOUTH 3 TIMES A DAY 270 capsule 0  . metFORMIN (GLUCOPHAGE) 1000 MG tablet TAKE 1 TABLET BY MOUTH 2 TIMES DAILY WITH A MEAL. 60 tablet 3  . methocarbamol (  ROBAXIN) 500 MG tablet Take 1 tablet (500 mg total) by mouth 4 (four) times daily. 30 tablet 0  . metoprolol succinate (TOPROL XL) 25 MG 24 hr tablet Take 1 tablet (25 mg total) by mouth daily. 30 tablet 11  . Multiple Vitamins-Minerals (WOMENS 50+ MULTI VITAMIN/MIN PO) Take 1 tablet by mouth daily.    . rizatriptan (MAXALT-MLT) 10 MG disintegrating tablet Take 1 tablet (10 mg total) by mouth as needed for migraine. May repeat in 2 hours if needed 9 tablet 11  . topiramate (TOPAMAX) 50 MG tablet Take 1 tablet (50 mg total) by mouth 2 (two) times daily. 60 tablet 12  . traMADol (ULTRAM) 50 MG tablet Take 1 tablet (50 mg total) by mouth 3 (three) times daily. 90 tablet 2  . traZODone (DESYREL) 150 MG tablet Take 2 tablets (300 mg total) by mouth at bedtime. 180 tablet 5   Current Facility-Administered Medications  Medication Dose Route Frequency Provider Last Rate Last  Dose  . betamethasone acetate-betamethasone sodium phosphate (CELESTONE) injection 3 mg  3 mg Intramuscular Once Edrick Kins, DPM        Allergies:   Shrimp [shellfish allergy]    Social History:  The patient  reports that she has never smoked. She has never used smokeless tobacco. She reports that she does not drink alcohol or use drugs.   Family History:  The patient's family history includes Cancer in her mother; Diabetes in her daughter and maternal grandmother; Hypertension in her maternal grandmother and mother.    ROS:  Please see the history of present illness.   Otherwise, review of systems are positive for pain from kidney stones.   All other systems are reviewed and negative.    PHYSICAL EXAM: VS:  BP 140/90   Pulse (!) 32   Ht 5\' 6"  (1.676 m)   Wt 211 lb (95.7 kg)   LMP  (LMP Unknown)   SpO2 98%   BMI 34.06 kg/m  , BMI Body mass index is 34.06 kg/m. GEN: Well nourished, well developed, in no acute distress  HEENT: normal  Neck: no JVD, carotid bruits, or masses Cardiac: RRR; no murmurs, rubs, or gallops,no edema  Respiratory:  clear to auscultation bilaterally, normal work of breathing GI: soft, nontender, nondistended, + BS MS: no deformity or atrophy  Skin: warm and dry, no rash Neuro:  Strength and sensation are intact Psych: euthymic mood, full affect   EKG:   The ekg ordered today demonstrates NSR, bigeminy   Recent Labs: 07/25/2016: ALT 20; BUN 25; Creatinine, Ser 1.27; Hemoglobin 13.4; Platelets 124; Potassium 4.1; Sodium 140   Lipid Panel    Component Value Date/Time   CHOL 131 06/16/2014 0938   TRIG 197 (H) 06/16/2014 0938   HDL 34 (L) 06/16/2014 0938   CHOLHDL 3.9 06/16/2014 0938   VLDL 39 06/16/2014 0938   LDLCALC 58 06/16/2014 0938     Other studies Reviewed: Additional studies/ records that were reviewed today with results demonstrating: echo results as noted.   ASSESSMENT AND PLAN:  1. Bigeminy : Frequent PVCs.  Will check 24 hour  Holter to quantify PVC burden.  If > 25%, would plan on EP referral for further management. 2. Chest pain: Resolved.  None recently.  Prior negative ischemic w/u. 3. HTN: Increase benazepril to 20 mg daily.  BMet in one week. 4. Fatigue: Check TSH in  A week as well given PVCs and fatigue. May be related to side effects from her meds. 5.  DM: COntinue aggressive lifestyle changes and strict diet.  Neuropathy from DM limits her exercise.    Current medicines are reviewed at length with the patient today.  The patient concerns regarding her medicines were addressed.  The following changes have been made:  No change  Labs/ tests ordered today include:  No orders of the defined types were placed in this encounter.   Recommend 150 minutes/week of aerobic exercise Low fat, low carb, high fiber diet recommended  Disposition:   FU based on results   Signed, Larae Grooms, MD  07/28/2016 9:10 AM    [facility] [address], [place], [place]  [address] Phone: [phone]; Fax: [phone]

## 2016-07-27 ENCOUNTER — Other Ambulatory Visit: Payer: Self-pay | Admitting: Podiatry

## 2016-07-28 ENCOUNTER — Encounter: Payer: Self-pay | Admitting: Interventional Cardiology

## 2016-07-28 ENCOUNTER — Ambulatory Visit (INDEPENDENT_AMBULATORY_CARE_PROVIDER_SITE_OTHER): Payer: Medicaid Other | Admitting: Interventional Cardiology

## 2016-07-28 VITALS — BP 140/90 | HR 32 | Ht 66.0 in | Wt 211.0 lb

## 2016-07-28 DIAGNOSIS — I498 Other specified cardiac arrhythmias: Secondary | ICD-10-CM

## 2016-07-28 DIAGNOSIS — I499 Cardiac arrhythmia, unspecified: Secondary | ICD-10-CM

## 2016-07-28 DIAGNOSIS — R5383 Other fatigue: Secondary | ICD-10-CM

## 2016-07-28 DIAGNOSIS — I10 Essential (primary) hypertension: Secondary | ICD-10-CM

## 2016-07-28 DIAGNOSIS — I493 Ventricular premature depolarization: Secondary | ICD-10-CM | POA: Diagnosis not present

## 2016-07-28 DIAGNOSIS — E1142 Type 2 diabetes mellitus with diabetic polyneuropathy: Secondary | ICD-10-CM

## 2016-07-28 DIAGNOSIS — R079 Chest pain, unspecified: Secondary | ICD-10-CM | POA: Diagnosis not present

## 2016-07-28 MED ORDER — BENAZEPRIL HCL 20 MG PO TABS: 20.0000 mg | ORAL_TABLET | Freq: Every day | ORAL | 3 refills | Status: DC

## 2016-07-28 NOTE — Patient Instructions (Signed)
Medication Instructions:  Decrease Benazepril to 20 mg daily. All of your remaining medications remain the same.  Labwork: BMET and TSH in 12 week.  Testing/Procedures: Your physician has recommended that you wear a 24 hour holter monitor. Holter monitors are medical devices that record the heart's electrical activity. Doctors most often use these monitors to diagnose arrhythmias. Arrhythmias are problems with the speed or rhythm of the heartbeat. The monitor is a small, portable device. You can wear one while you do your normal daily activities. This is usually used to diagnose what is causing palpitations/syncope (passing out).    Follow-Up: Based on results of monitor.     If you need a refill on your cardiac medications before your next appointment, please call your pharmacy.

## 2016-08-01 ENCOUNTER — Ambulatory Visit: Payer: Medicaid Other | Admitting: Podiatry

## 2016-08-04 ENCOUNTER — Ambulatory Visit (INDEPENDENT_AMBULATORY_CARE_PROVIDER_SITE_OTHER): Payer: Medicaid Other

## 2016-08-04 DIAGNOSIS — I493 Ventricular premature depolarization: Secondary | ICD-10-CM

## 2016-08-05 ENCOUNTER — Other Ambulatory Visit: Payer: Medicaid Other | Admitting: *Deleted

## 2016-08-05 DIAGNOSIS — I10 Essential (primary) hypertension: Secondary | ICD-10-CM

## 2016-08-05 DIAGNOSIS — I200 Unstable angina: Secondary | ICD-10-CM

## 2016-08-05 DIAGNOSIS — E1142 Type 2 diabetes mellitus with diabetic polyneuropathy: Secondary | ICD-10-CM

## 2016-08-06 LAB — BASIC METABOLIC PANEL
BUN / CREAT RATIO: 13 (ref 9–23)
BUN: 14 mg/dL (ref 6–24)
CO2: 24 mmol/L (ref 18–29)
CREATININE: 1.1 mg/dL — AB (ref 0.57–1.00)
Calcium: 9.3 mg/dL (ref 8.7–10.2)
Chloride: 100 mmol/L (ref 96–106)
GFR calc Af Amer: 65 mL/min/{1.73_m2} (ref >59)
GFR calc non Af Amer: 56 mL/min/{1.73_m2} — ABNORMAL LOW (ref >59)
Glucose: 113 mg/dL — ABNORMAL HIGH (ref 65–99)
Potassium: 4.8 mmol/L (ref 3.5–5.2)
SODIUM: 144 mmol/L (ref 134–144)

## 2016-08-06 LAB — TSH: TSH: 2.73 u[IU]/mL (ref 0.450–4.500)

## 2016-08-19 ENCOUNTER — Ambulatory Visit
Admission: RE | Admit: 2016-08-19 | Discharge: 2016-08-19 | Disposition: A | Discharge status: Home / Self Care | Payer: Medicaid Other | Source: Ambulatory Visit | Attending: Nurse Practitioner | Admitting: Nurse Practitioner

## 2016-08-19 DIAGNOSIS — Z1231 Encounter for screening mammogram for malignant neoplasm of breast: Secondary | ICD-10-CM

## 2016-08-22 ENCOUNTER — Ambulatory Visit (INDEPENDENT_AMBULATORY_CARE_PROVIDER_SITE_OTHER): Payer: Medicaid Other | Admitting: Podiatry

## 2016-08-22 DIAGNOSIS — M79671 Pain in right foot: Secondary | ICD-10-CM

## 2016-08-22 DIAGNOSIS — G5752 Tarsal tunnel syndrome, left lower limb: Secondary | ICD-10-CM | POA: Diagnosis not present

## 2016-08-22 DIAGNOSIS — G5751 Tarsal tunnel syndrome, right lower limb: Secondary | ICD-10-CM | POA: Diagnosis not present

## 2016-08-22 DIAGNOSIS — R2241 Localized swelling, mass and lump, right lower limb: Secondary | ICD-10-CM

## 2016-08-22 DIAGNOSIS — G5792 Unspecified mononeuropathy of left lower limb: Secondary | ICD-10-CM

## 2016-08-22 DIAGNOSIS — M7752 Other enthesopathy of left foot: Secondary | ICD-10-CM

## 2016-08-22 DIAGNOSIS — G5791 Unspecified mononeuropathy of right lower limb: Secondary | ICD-10-CM

## 2016-08-22 DIAGNOSIS — M7751 Other enthesopathy of right foot: Secondary | ICD-10-CM

## 2016-08-22 MED ORDER — BETAMETHASONE SOD PHOS & ACET 6 (3-3) MG/ML IJ SUSP: 3.0000 mg | Freq: Once | INTRAMUSCULAR | Status: AC

## 2016-08-22 NOTE — Progress Notes (Signed)
Subjective: Patient presents today status post EPF surgery to the bilateral foot. Date of surgery 03/10/2016. Patient complains today of severe pain and tenderness to the bilateral feet. She is in significant amount of pain diffusely throughout the feet. There are no alleviating factors. Patient also has a history of peripheral neuropathy secondary to diabetes mellitus.  Objective: Physical Exam General: The patient is alert and oriented x3 in no acute distress.  Dermatology: Skin is cool, dry and supple bilateral lower extremities. Negative for open lesions or macerations.  Vascular: Palpable pedal pulses bilaterally. No edema or erythema noted. Capillary refill within normal limits.  Neurological: Epicritic and protective threshold grossly intact bilaterally.   Musculoskeletal Exam: Electrical pain sensations upon percussion of the tibial nerve to the bilateral lower extremities consistent with a tarsal tunnel syndrome. There is a symptomatic painful soft tissue mass also noted to the medial longitudinal arch of the right plantar midfoot. Pain on palpation and range of motion also noted diffusely throughout the metatarsal phalangeal joints 1-5 bilateral.    Negative for pain on palpation to the medial calcaneal tubercle the right foot at the insertion of the plantar fascia.  Assessment:  #1 tarsal tunnel syndrome bilateral #2 metatarsalgia bilateral  #3 status post EPF surgery bilateral. DOS: 03/10/2016.  Plan of care: #1 patient was evaluated #2 injection of 0.5 mL Celestone Soluspan injected into the tarsal tunnel just posterior to the medial malleolus bilateral lower extremities.  #3 injection of 0.5 mL Celestone Soluspan injected into the soft tissue mass right plantar midfoot arch. #4 metatarsal pads were dispensed today to offload the forefoot bilateral #5 return to clinic in 4 weeks #6 continue conservative modalities including good shoe gear with insoles, meloxicam 15  mg.  When patient returns in 4 weeks and may consider another round of a Medrol Dosepak.

## 2016-09-09 ENCOUNTER — Encounter: Payer: Self-pay | Admitting: Internal Medicine

## 2016-09-09 ENCOUNTER — Encounter: Payer: Self-pay | Admitting: *Deleted

## 2016-09-09 ENCOUNTER — Ambulatory Visit (INDEPENDENT_AMBULATORY_CARE_PROVIDER_SITE_OTHER): Payer: Medicaid Other | Admitting: Internal Medicine

## 2016-09-09 VITALS — BP 124/74 | HR 32 | Ht 66.0 in | Wt 205.0 lb

## 2016-09-09 DIAGNOSIS — I493 Ventricular premature depolarization: Secondary | ICD-10-CM

## 2016-09-09 DIAGNOSIS — Z01812 Encounter for preprocedural laboratory examination: Secondary | ICD-10-CM

## 2016-09-09 DIAGNOSIS — I10 Essential (primary) hypertension: Secondary | ICD-10-CM | POA: Diagnosis not present

## 2016-09-09 NOTE — Progress Notes (Signed)
Electrophysiology Office Note    Date:  09/09/2016   ID:  Briana Alvarez, DOB 15-Jun-1961, MRN [id number]  PCP:  Vicenta Aly, FNP  Cardiologist:  Dr Irish Lack Primary Electrophysiologist: [name], MD    Chief Complaint  Patient presents with  . PVC (premature ventricular contraction)     History of Present Illness: Briana Alvarez is a 56 y.o. female who presents today for electrophysiology evaluation.   The patient has been referred by Dr Beau Fanny for consultation regarding PVCs.  The patient reports that several months ago she was noted to have PVCs on routine office visit.  She was referred to Dr Beau Fanny and noted to have 41% PVCs (37k).  She has fatigue and decreased exercise tolerance.    Today, she denies symptoms of palpitations, chest pain, shortness of breath, orthopnea, PND, lower extremity edema, claudication, dizziness, presyncope, syncope, bleeding, or neurologic sequela. The patient is tolerating medications without difficulties and is otherwise without complaint today.    Past Medical History:  Diagnosis Date  . Anxiety   . Arthritis   . Depression   . Diabetes mellitus   . Headache    "2 a month"  . Hypertension   . Neuropathy ([place])   . Pneumonia    Past Surgical History:  Procedure Laterality Date  . ABDOMINAL HYSTERECTOMY    . BREAST EXCISIONAL BIOPSY  2001   LEFT BREAST  . CARPAL TUNNEL RELEASE    . KNEE ARTHROSCOPY Right   . KNEE SURGERY Left   . NECK SURGERY    . SHOULDER ARTHROSCOPY    . SHOULDER ARTHROSCOPY WITH SUBACROMIAL DECOMPRESSION AND BICEP TENDON REPAIR Right 02/15/2016   Procedure: SHOULDER DIAGNOSTIC OPERATIVE ARTHROSCOPY WITH SUBACROMIAL DECOMPRESSION AND DISTAL CLAVICLE EXCISION;  Surgeon: Meredith Pel, MD;  Location: [facility];  Service: Orthopedics;  Laterality: Right;  . TONSILLECTOMY    . ulner nerve        Current Outpatient Prescriptions  Medication Sig Dispense Refill  . albuterol (PROVENTIL HFA;VENTOLIN HFA) 108 (90  BASE) MCG/ACT inhaler Inhale 2 puffs into the lungs every 6 (six) hours as needed for wheezing or shortness of breath. 1 Inhaler 2  . benazepril (LOTENSIN) 20 MG tablet Take 1 tablet (20 mg total) by mouth daily. 90 tablet 3  . exenatide (BYETTA 5 MCG PEN) 5 MCG/0.02ML SOPN injection Inject 0.02 mLs (5 mcg total) into the skin 2 (two) times daily with a meal. 1.2 mL 3  . gabapentin (NEURONTIN) 300 MG capsule TAKE 3 CAPSULES BY MOUTH 3 TIMES A DAY 270 capsule 0  . metFORMIN (GLUCOPHAGE) 1000 MG tablet TAKE 1 TABLET BY MOUTH 2 TIMES DAILY WITH A MEAL. 60 tablet 3  . methocarbamol (ROBAXIN) 500 MG tablet Take 1 tablet (500 mg total) by mouth 4 (four) times daily. 30 tablet 0  . metoprolol succinate (TOPROL XL) 25 MG 24 hr tablet Take 1 tablet (25 mg total) by mouth daily. 30 tablet 11  . Multiple Vitamins-Minerals (WOMENS 50+ MULTI VITAMIN/MIN PO) Take 1 tablet by mouth daily.    . nabumetone (RELAFEN) 750 MG tablet TAKE 1 TABLET (750 MG TOTAL) BY MOUTH 2 (TWO) TIMES DAILY. 60 tablet 1  . rizatriptan (MAXALT-MLT) 10 MG disintegrating tablet Take 1 tablet (10 mg total) by mouth as needed for migraine. May repeat in 2 hours if needed 9 tablet 11  . topiramate (TOPAMAX) 50 MG tablet Take 1 tablet (50 mg total) by mouth 2 (two) times daily. 60 tablet 12  . traZODone (DESYREL) 150  MG tablet Take 2 tablets (300 mg total) by mouth at bedtime. 180 tablet 5   Current Facility-Administered Medications  Medication Dose Route Frequency Provider Last Rate Last Dose  . betamethasone acetate-betamethasone sodium phosphate (CELESTONE) injection 3 mg  3 mg Intramuscular Once Edrick Kins, DPM      . betamethasone acetate-betamethasone sodium phosphate (CELESTONE) injection 3 mg  3 mg Intramuscular Once Edrick Kins, DPM        Allergies:   Shrimp [shellfish allergy]   Social History:  The patient  reports that she has never smoked. She has never used smokeless tobacco. She reports that she does not drink alcohol  or use drugs.   Family History:  The patient's  family history includes Cancer in her mother; Diabetes in her daughter and maternal grandmother; Hypertension in her maternal grandmother and mother.    ROS:  Please see the history of present illness.   All other systems are personally reviewed and negative.    PHYSICAL EXAM: VS:  BP 124/74   Pulse (!) 32   Ht 5\' 6"  (1.676 m)   Wt 205 lb (93 kg)   LMP  (LMP Unknown)   SpO2 98%   BMI 33.09 kg/m  , BMI Body mass index is 33.09 kg/m. GEN: Well nourished, well developed, in no acute distress  HEENT: normal  Neck: no JVD, carotid bruits, or masses Cardiac: RRR with ectopy in bigeminy; no murmurs, rubs, or gallops,no edema  Respiratory:  clear to auscultation bilaterally, normal work of breathing GI: soft, nontender, nondistended, + BS MS: no deformity or atrophy  Skin: warm and dry  Neuro:  Strength and sensation are intact Psych: euthymic mood, full affect  EKG:  EKG is not ordered today. The ekg 07/28/16 reveals sinus bradycardia with PVCs in bigeminy (LBB inferior axis) with transition abruptly at V3.   Recent Labs: 07/25/2016: ALT 20; Hemoglobin 13.4; Platelets 124 08/05/2016: BUN 14; Creatinine, Ser 1.10; Potassium 4.8; Sodium 144; TSH 2.730  personally reviewed   Lipid Panel     Component Value Date/Time   CHOL 131 06/16/2014 0938   TRIG 197 (H) 06/16/2014 0938   HDL 34 (L) 06/16/2014 0938   CHOLHDL 3.9 06/16/2014 0938   VLDL 39 06/16/2014 0938   LDLCALC 58 06/16/2014 0938   personally reviewed   Wt Readings from Last 3 Encounters:  09/09/16 205 lb (93 kg)  07/28/16 211 lb (95.7 kg)  07/25/16 211 lb (95.7 kg)      Other studies personally reviewed: Additional studies/ records that were reviewed today include: Dr Morton Amy notes, prior echo, event monitor are all personally reviewed  Review of the above records today demonstrates: as above   ASSESSMENT AND PLAN:  1.  PVCs The patient has very frequent PVCs  with symptoms of fatigue and decreased exercise tolerance.  Therapeutic strategies for symptomatic PVCs including medicine and ablation were discussed in detail with the patient today. Risk, benefits, and alternatives to EP study and radiofrequency ablation were also discussed in detail today. These risks include but are not limited to stroke, bleeding, vascular damage, tamponade, perforation, damage to the heart and other structures, AV block requiring pacemaker, worsening renal function, and death. The patient understands these risk and wishes to proceed.  We will therefore proceed with catheter ablation at the next available time.  Carto, Ice and anesthesia are requested for the procedure.  2. HTN Stable No change required today    Current medicines are reviewed at length with the  patient today.   The patient does not have concerns regarding her medicines.  The following changes were made today:  none  Signed, [name], MD  09/09/2016 11:06 AM     [facility] [address] [place] [address] [phone] (office) [phone] (fax)

## 2016-09-09 NOTE — Patient Instructions (Signed)
Medication Instructions: - Your physician recommends that you continue on your current medications as directed. Please refer to the Current Medication list given to you today.  Labwork: - Your physician recommends that you return for lab work: the week prior to your ablation (the week of 09/19/16)- BMP/CBC/ INR  Procedures/Testing: - Your physician has recommended that you have an ablation. Catheter ablation is a medical procedure used to treat some cardiac arrhythmias (irregular heartbeats). During catheter ablation, a long, thin, flexible tube is put into a blood vessel in your groin (upper thigh), or neck. This tube is called an ablation catheter. It is then guided to your heart through the blood vessel. Radio frequency waves destroy small areas of heart tissue where abnormal heartbeats may cause an arrhythmia to start.   ** Tuesday 09/27/16 with Dr. Rayann Heman- you will arrive at 9:30 am for an 11:30 am procedure **   ** a detailed letter of instructions will be mailed to you **   Follow-Up: - Your physician recommends that you schedule a follow-up appointment: 4 weeks (from 09/27/16) with Dr. Rayann Heman.  Any Additional Special Instructions Will Be Listed Below (If Applicable).     If you need a refill on your cardiac medications before your next appointment, please call your pharmacy. \

## 2016-09-19 ENCOUNTER — Other Ambulatory Visit: Payer: Medicaid Other | Admitting: *Deleted

## 2016-09-19 ENCOUNTER — Ambulatory Visit (INDEPENDENT_AMBULATORY_CARE_PROVIDER_SITE_OTHER): Payer: Medicaid Other | Admitting: Podiatry

## 2016-09-19 DIAGNOSIS — G5751 Tarsal tunnel syndrome, right lower limb: Secondary | ICD-10-CM | POA: Diagnosis not present

## 2016-09-19 DIAGNOSIS — I493 Ventricular premature depolarization: Secondary | ICD-10-CM

## 2016-09-19 DIAGNOSIS — M79671 Pain in right foot: Secondary | ICD-10-CM

## 2016-09-19 DIAGNOSIS — M79672 Pain in left foot: Secondary | ICD-10-CM

## 2016-09-19 DIAGNOSIS — G5752 Tarsal tunnel syndrome, left lower limb: Secondary | ICD-10-CM | POA: Diagnosis not present

## 2016-09-19 DIAGNOSIS — Z01812 Encounter for preprocedural laboratory examination: Secondary | ICD-10-CM

## 2016-09-19 NOTE — Progress Notes (Signed)
Subjective: Patient presents today status post EPF surgery to the bilateral foot. Date of surgery 03/10/2016. Patient states that her symptoms are approximately the same since last visit. There are no alleviating factors. Patient also has a history of peripheral neuropathy secondary to diabetes mellitus.   Objective: Physical Exam General: The patient is alert and oriented x3 in no acute distress.  Dermatology: Skin is cool, dry and supple bilateral lower extremities. Negative for open lesions or macerations.  Vascular: Palpable pedal pulses bilaterally. No edema or erythema noted. Capillary refill within normal limits.  Neurological: Epicritic and protective threshold grossly intact bilaterally.   Musculoskeletal Exam: Electrical pain sensations upon percussion of the tibial nerve to the bilateral lower extremities consistent with a tarsal tunnel syndrome. There is a symptomatic painful soft tissue mass also noted to the medial longitudinal arch of the right plantar midfoot. Pain on palpation and range of motion also noted diffusely throughout the metatarsal phalangeal joints 1-5 bilateral.    Negative for pain on palpation to the medial calcaneal tubercle the right foot at the insertion of the plantar fascia.  Assessment:  #1 tarsal tunnel syndrome bilateral #2 metatarsalgia bilateral  #3 status post EPF surgery bilateral. DOS: 03/10/2016.  Plan of care: #1 patient was evaluated #2 patient is scheduled to undergo cardiac surgery in the upcoming weeks. Today we will not do injections or any oral medication that would possibly interfere with the surgery. #3 plantar fascial bands dispensed bilateral #4 return to clinic in 4 weeks   patient states meloxicam does not help  Edrick Kins, DPM [facility]  Dr. Edrick Kins, [place]                                        [place], [place] [address]                Office [phone]  Fax [phone]

## 2016-09-20 LAB — BASIC METABOLIC PANEL
BUN / CREAT RATIO: 21 (ref 9–23)
BUN: 23 mg/dL (ref 6–24)
CO2: 21 mmol/L (ref 18–29)
CREATININE: 1.12 mg/dL — AB (ref 0.57–1.00)
Calcium: 9.2 mg/dL (ref 8.7–10.2)
Chloride: 104 mmol/L (ref 96–106)
GFR calc Af Amer: 63 mL/min/{1.73_m2} (ref >59)
GFR, EST NON AFRICAN AMERICAN: 55 mL/min/{1.73_m2} — AB (ref >59)
GLUCOSE: 102 mg/dL — AB (ref 65–99)
POTASSIUM: 4.4 mmol/L (ref 3.5–5.2)
SODIUM: 143 mmol/L (ref 134–144)

## 2016-09-20 LAB — CBC WITH DIFFERENTIAL/PLATELET
BASOS: 0 %
Basophils Absolute: 0 10*3/uL (ref 0.0–0.2)
EOS (ABSOLUTE): 0.1 10*3/uL (ref 0.0–0.4)
EOS: 2 %
Hematocrit: 37.7 % (ref 34.0–46.6)
Hemoglobin: 12.8 g/dL (ref 11.1–15.9)
IMMATURE GRANS (ABS): 0 10*3/uL (ref 0.0–0.1)
IMMATURE GRANULOCYTES: 0 %
Lymphocytes Absolute: 1.8 10*3/uL (ref 0.7–3.1)
Lymphs: 32 %
MCH: 32.2 pg (ref 26.6–33.0)
MCHC: 34 g/dL (ref 31.5–35.7)
MCV: 95 fL (ref 79–97)
Monocytes Absolute: 0.4 10*3/uL (ref 0.1–0.9)
Monocytes: 7 %
NEUTROS PCT: 59 %
Neutrophils Absolute: 3.3 10*3/uL (ref 1.4–7.0)
PLATELETS: 136 10*3/uL — AB (ref 150–379)
RBC: 3.97 x10E6/uL (ref 3.77–5.28)
RDW: 14.2 % (ref 12.3–15.4)
WBC: 5.6 10*3/uL (ref 3.4–10.8)

## 2016-09-20 LAB — PROTIME-INR
INR: 1.1 (ref 0.8–1.2)
PROTHROMBIN TIME: 11.2 s (ref 9.1–12.0)

## 2016-09-22 ENCOUNTER — Telehealth: Payer: Self-pay | Admitting: Nurse Practitioner

## 2016-09-22 NOTE — Telephone Encounter (Signed)
New message   Pt is returning call to [facility] about labs

## 2016-09-26 ENCOUNTER — Telehealth: Payer: Self-pay | Admitting: Podiatry

## 2016-09-26 NOTE — Telephone Encounter (Signed)
Left message informing pt that Dr. Trinidad Curet the note for out of work for 09/23/2016 and 09/24/2016, and it could be picked up in the [place] office.

## 2016-09-26 NOTE — Telephone Encounter (Signed)
Pt said her feet was hurting so bad she couldn't go to work 4/6 or 4/7 and would like a work note.

## 2016-09-27 ENCOUNTER — Ambulatory Visit (HOSPITAL_COMMUNITY)
Admission: RE | Admit: 2016-09-27 | Discharge: 2016-09-28 | Disposition: A | Discharge status: Home / Self Care | Payer: Medicaid Other | Source: Ambulatory Visit | Attending: Internal Medicine | Admitting: Internal Medicine

## 2016-09-27 ENCOUNTER — Ambulatory Visit (HOSPITAL_COMMUNITY): Payer: Medicaid Other | Admitting: Certified Registered Nurse Anesthetist

## 2016-09-27 ENCOUNTER — Encounter (HOSPITAL_COMMUNITY): Payer: Self-pay | Admitting: Certified Registered Nurse Anesthetist

## 2016-09-27 ENCOUNTER — Encounter (HOSPITAL_COMMUNITY)
Admission: RE | Disposition: A | Discharge status: Home / Self Care | Payer: Self-pay | Source: Ambulatory Visit | Attending: Internal Medicine

## 2016-09-27 DIAGNOSIS — Z7951 Long term (current) use of inhaled steroids: Secondary | ICD-10-CM | POA: Insufficient documentation

## 2016-09-27 DIAGNOSIS — F329 Major depressive disorder, single episode, unspecified: Secondary | ICD-10-CM | POA: Insufficient documentation

## 2016-09-27 DIAGNOSIS — I472 Ventricular tachycardia: Secondary | ICD-10-CM | POA: Insufficient documentation

## 2016-09-27 DIAGNOSIS — E1140 Type 2 diabetes mellitus with diabetic neuropathy, unspecified: Secondary | ICD-10-CM | POA: Insufficient documentation

## 2016-09-27 DIAGNOSIS — G5751 Tarsal tunnel syndrome, right lower limb: Secondary | ICD-10-CM

## 2016-09-27 DIAGNOSIS — M79671 Pain in right foot: Secondary | ICD-10-CM

## 2016-09-27 DIAGNOSIS — F419 Anxiety disorder, unspecified: Secondary | ICD-10-CM | POA: Insufficient documentation

## 2016-09-27 DIAGNOSIS — G5752 Tarsal tunnel syndrome, left lower limb: Secondary | ICD-10-CM

## 2016-09-27 DIAGNOSIS — Z7984 Long term (current) use of oral hypoglycemic drugs: Secondary | ICD-10-CM | POA: Diagnosis not present

## 2016-09-27 DIAGNOSIS — E119 Type 2 diabetes mellitus without complications: Secondary | ICD-10-CM | POA: Diagnosis not present

## 2016-09-27 DIAGNOSIS — Z91013 Allergy to seafood: Secondary | ICD-10-CM | POA: Diagnosis not present

## 2016-09-27 DIAGNOSIS — M79672 Pain in left foot: Secondary | ICD-10-CM

## 2016-09-27 DIAGNOSIS — M7752 Other enthesopathy of left foot: Secondary | ICD-10-CM

## 2016-09-27 DIAGNOSIS — I10 Essential (primary) hypertension: Secondary | ICD-10-CM | POA: Insufficient documentation

## 2016-09-27 DIAGNOSIS — F319 Bipolar disorder, unspecified: Secondary | ICD-10-CM | POA: Insufficient documentation

## 2016-09-27 DIAGNOSIS — Z79899 Other long term (current) drug therapy: Secondary | ICD-10-CM | POA: Diagnosis not present

## 2016-09-27 DIAGNOSIS — M1990 Unspecified osteoarthritis, unspecified site: Secondary | ICD-10-CM | POA: Diagnosis not present

## 2016-09-27 DIAGNOSIS — M7751 Other enthesopathy of right foot: Secondary | ICD-10-CM

## 2016-09-27 DIAGNOSIS — G5792 Unspecified mononeuropathy of left lower limb: Secondary | ICD-10-CM

## 2016-09-27 DIAGNOSIS — G5791 Unspecified mononeuropathy of right lower limb: Secondary | ICD-10-CM

## 2016-09-27 DIAGNOSIS — I493 Ventricular premature depolarization: Secondary | ICD-10-CM | POA: Diagnosis present

## 2016-09-27 HISTORY — PX: V TACH ABLATION: EP1227

## 2016-09-27 LAB — GLUCOSE, CAPILLARY
GLUCOSE-CAPILLARY: 107 mg/dL — AB (ref 65–99)
Glucose-Capillary: 111 mg/dL — ABNORMAL HIGH (ref 65–99)

## 2016-09-27 SURGERY — V TACH ABLATION
Anesthesia: Monitor Anesthesia Care

## 2016-09-27 MED ORDER — TOPIRAMATE 25 MG PO TABS
50.0000 mg | ORAL_TABLET | Freq: Two times a day (BID) | ORAL | Status: DC
  Administered 2016-09-27 – 2016-09-28 (×2): 50 mg via ORAL
  Filled 2016-09-27 (×2): qty 2

## 2016-09-27 MED ORDER — ONDANSETRON HCL 4 MG/2ML IJ SOLN: 4.0000 mg | Freq: Four times a day (QID) | INTRAMUSCULAR | Status: DC | PRN

## 2016-09-27 MED ORDER — METOPROLOL SUCCINATE ER 25 MG PO TB24: 25.0000 mg | ORAL_TABLET | Freq: Every day | ORAL | Status: DC

## 2016-09-27 MED ORDER — ISOPROTERENOL HCL 0.2 MG/ML IJ SOLN
INTRAMUSCULAR | Status: AC
  Filled 2016-09-27: qty 5

## 2016-09-27 MED ORDER — SUMATRIPTAN SUCCINATE 50 MG PO TABS
50.0000 mg | ORAL_TABLET | ORAL | Status: DC | PRN
  Filled 2016-09-27: qty 1

## 2016-09-27 MED ORDER — ISOPROTERENOL HCL 0.2 MG/ML IJ SOLN
INTRAMUSCULAR | Status: DC | PRN
  Administered 2016-09-27: 4 ug/min via INTRAVENOUS

## 2016-09-27 MED ORDER — SODIUM CHLORIDE 0.9 % IV SOLN
INTRAVENOUS | Status: DC
  Administered 2016-09-27 (×2): via INTRAVENOUS

## 2016-09-27 MED ORDER — HEPARIN SODIUM (PORCINE) 1000 UNIT/ML IJ SOLN
INTRAMUSCULAR | Status: AC
  Filled 2016-09-27: qty 1

## 2016-09-27 MED ORDER — HYDROCODONE-ACETAMINOPHEN 5-325 MG PO TABS
1.0000 | ORAL_TABLET | ORAL | Status: DC | PRN
  Filled 2016-09-27: qty 2

## 2016-09-27 MED ORDER — NABUMETONE 750 MG PO TABS
750.0000 mg | ORAL_TABLET | Freq: Two times a day (BID) | ORAL | Status: DC
  Administered 2016-09-27: 750 mg via ORAL
  Filled 2016-09-27 (×2): qty 1

## 2016-09-27 MED ORDER — SODIUM CHLORIDE 0.9 % IV SOLN: 250.0000 mL | INTRAVENOUS | Status: DC | PRN

## 2016-09-27 MED ORDER — RIZATRIPTAN BENZOATE 10 MG PO TBDP: 10.0000 mg | ORAL_TABLET | ORAL | Status: DC | PRN

## 2016-09-27 MED ORDER — TRAZODONE HCL 150 MG PO TABS
300.0000 mg | ORAL_TABLET | Freq: Every day | ORAL | Status: DC
  Administered 2016-09-27: 300 mg via ORAL
  Filled 2016-09-27: qty 2

## 2016-09-27 MED ORDER — HYDRALAZINE HCL 20 MG/ML IJ SOLN
INTRAMUSCULAR | Status: DC | PRN
  Administered 2016-09-27 (×2): 10 mg via INTRAVENOUS

## 2016-09-27 MED ORDER — SODIUM CHLORIDE 0.9% FLUSH: 3.0000 mL | INTRAVENOUS | Status: DC | PRN

## 2016-09-27 MED ORDER — BUPIVACAINE HCL (PF) 0.25 % IJ SOLN
INTRAMUSCULAR | Status: DC | PRN
  Administered 2016-09-27: 20 mL

## 2016-09-27 MED ORDER — SODIUM CHLORIDE 0.9% FLUSH
3.0000 mL | Freq: Two times a day (BID) | INTRAVENOUS | Status: DC
  Administered 2016-09-27: 3 mL via INTRAVENOUS

## 2016-09-27 MED ORDER — ACETAMINOPHEN 325 MG PO TABS
650.0000 mg | ORAL_TABLET | ORAL | Status: DC | PRN
  Administered 2016-09-27: 650 mg via ORAL
  Filled 2016-09-27: qty 2

## 2016-09-27 MED ORDER — PROPOFOL 500 MG/50ML IV EMUL
INTRAVENOUS | Status: DC | PRN
  Administered 2016-09-27: 50 ug/kg/min via INTRAVENOUS

## 2016-09-27 MED ORDER — HEPARIN (PORCINE) IN NACL 2-0.9 UNIT/ML-% IJ SOLN
INTRAMUSCULAR | Status: DC | PRN
  Administered 2016-09-27: 1000 mL

## 2016-09-27 MED ORDER — HEPARIN SODIUM (PORCINE) 1000 UNIT/ML IJ SOLN
INTRAMUSCULAR | Status: DC | PRN
  Administered 2016-09-27: 1000 [IU] via INTRAVENOUS

## 2016-09-27 MED ORDER — BUPIVACAINE HCL (PF) 0.25 % IJ SOLN
INTRAMUSCULAR | Status: AC
  Filled 2016-09-27: qty 30

## 2016-09-27 MED ORDER — MIDAZOLAM HCL 5 MG/5ML IJ SOLN
INTRAMUSCULAR | Status: DC | PRN
  Administered 2016-09-27: 2 mg via INTRAVENOUS

## 2016-09-27 MED ORDER — BENAZEPRIL HCL 20 MG PO TABS
20.0000 mg | ORAL_TABLET | Freq: Every day | ORAL | Status: DC
  Administered 2016-09-27 – 2016-09-28 (×2): 20 mg via ORAL
  Filled 2016-09-27: qty 4
  Filled 2016-09-27 (×2): qty 1
  Filled 2016-09-27: qty 4

## 2016-09-27 MED ORDER — BETAMETHASONE SOD PHOS & ACET 6 (3-3) MG/ML IJ SUSP: 3.0000 mg | Freq: Once | INTRAMUSCULAR | Status: DC

## 2016-09-27 MED ORDER — FENTANYL CITRATE (PF) 100 MCG/2ML IJ SOLN
INTRAMUSCULAR | Status: DC | PRN
  Administered 2016-09-27 (×2): 50 ug via INTRAVENOUS

## 2016-09-27 MED ORDER — PROPOFOL 10 MG/ML IV BOLUS
INTRAVENOUS | Status: DC | PRN
  Administered 2016-09-27: 20 mg via INTRAVENOUS
  Administered 2016-09-27: 10 mg via INTRAVENOUS
  Administered 2016-09-27: 20 mg via INTRAVENOUS

## 2016-09-27 MED ORDER — EXENATIDE 5 MCG/0.02ML ~~LOC~~ SOPN: 5.0000 ug | PEN_INJECTOR | Freq: Two times a day (BID) | SUBCUTANEOUS | Status: DC

## 2016-09-27 MED ORDER — GABAPENTIN 300 MG PO CAPS
300.0000 mg | ORAL_CAPSULE | Freq: Three times a day (TID) | ORAL | Status: DC
  Administered 2016-09-27 – 2016-09-28 (×2): 300 mg via ORAL
  Filled 2016-09-27 (×2): qty 1

## 2016-09-27 SURGICAL SUPPLY — 17 items
BAG SNAP BAND KOVER 36X36 (MISCELLANEOUS) ×3 IMPLANT
BLANKET WARM UNDERBOD FULL ACC (MISCELLANEOUS) ×3 IMPLANT
CATH DECANAV D CURVE (CATHETERS) ×3 IMPLANT
CATH JOSEPHSON QUAD-ALLRED 6FR (CATHETERS) ×3 IMPLANT
CATH NAVISTAR SMARTTOUCH DF (ABLATOR) ×3 IMPLANT
CATH WEBSTER BI DIR CS D-F CRV (CATHETERS) ×3 IMPLANT
COVER SWIFTLINK CONNECTOR (BAG) ×3 IMPLANT
PACK EP LATEX FREE (CUSTOM PROCEDURE TRAY) ×2
PACK EP LF (CUSTOM PROCEDURE TRAY) ×1 IMPLANT
PAD DEFIB LIFELINK (PAD) ×3 IMPLANT
PATCH CARTO3 (PAD) ×3 IMPLANT
SHEATH PINNACLE 6F 10CM (SHEATH) ×3 IMPLANT
SHEATH PINNACLE 7F 10CM (SHEATH) ×3 IMPLANT
SHEATH PINNACLE 8F 10CM (SHEATH) ×6 IMPLANT
SHIELD RADPAD SCOOP 12X17 (MISCELLANEOUS) ×3 IMPLANT
SOUNDSTAR ECO 8FR (CATHETERS) ×3 IMPLANT
TUBING SMART ABLATE COOLFLOW (TUBING) ×3 IMPLANT

## 2016-09-27 NOTE — Discharge Instructions (Signed)
No driving for 3 days. No lifting over 5 lbs for 1 week. No sexual activity for 1 week. You may return to work in 1 week.  Keep procedure site clean & dry. If you notice increased pain, swelling, bleeding or pus, call/return!  You may shower, but no soaking baths/hot tubs/pools for 1 week.  ° ° °

## 2016-09-27 NOTE — Transfer of Care (Signed)
Immediate Anesthesia Transfer of Care Note  Patient: Briana Alvarez  Procedure(s) Performed: Procedure(s): V Tach Ablation (N/A)  Patient Location: PACU and Cath Lab  Anesthesia Type:MAC  Level of Consciousness: awake, alert , oriented and patient cooperative  Airway & Oxygen Therapy: Patient Spontanous Breathing  Post-op Assessment: Report given to RN and Post -op Vital signs reviewed and stable  Post vital signs: Reviewed and stable  Last Vitals:  Vitals:   09/27/16 1002  BP: (!) 145/87  Pulse: (!) 52  Resp: 18  Temp: 36.7 C    Last Pain:  Vitals:   09/27/16 1002  TempSrc: Oral         Complications: No apparent anesthesia complications

## 2016-09-27 NOTE — H&P (View-Only) (Signed)
Electrophysiology Office Note    Date:  09/09/2016   ID:  Briana Alvarez, DOB 06-04-1961, MRN [id number]  PCP:  Vicenta Aly, FNP  Cardiologist:  Dr Irish Lack Primary Electrophysiologist: [name], MD    Chief Complaint  Patient presents with  . PVC (premature ventricular contraction)     History of Present Illness: Briana Alvarez is a 56 y.o. female who presents today for electrophysiology evaluation.   The patient has been referred by Dr Beau Fanny for consultation regarding PVCs.  The patient reports that several months ago she was noted to have PVCs on routine office visit.  She was referred to Dr Beau Fanny and noted to have 41% PVCs (37k).  She has fatigue and decreased exercise tolerance.    Today, she denies symptoms of palpitations, chest pain, shortness of breath, orthopnea, PND, lower extremity edema, claudication, dizziness, presyncope, syncope, bleeding, or neurologic sequela. The patient is tolerating medications without difficulties and is otherwise without complaint today.    Past Medical History:  Diagnosis Date  . Anxiety   . Arthritis   . Depression   . Diabetes mellitus   . Headache    "2 a month"  . Hypertension   . Neuropathy ([place])   . Pneumonia    Past Surgical History:  Procedure Laterality Date  . ABDOMINAL HYSTERECTOMY    . BREAST EXCISIONAL BIOPSY  2001   LEFT BREAST  . CARPAL TUNNEL RELEASE    . KNEE ARTHROSCOPY Right   . KNEE SURGERY Left   . NECK SURGERY    . SHOULDER ARTHROSCOPY    . SHOULDER ARTHROSCOPY WITH SUBACROMIAL DECOMPRESSION AND BICEP TENDON REPAIR Right 02/15/2016   Procedure: SHOULDER DIAGNOSTIC OPERATIVE ARTHROSCOPY WITH SUBACROMIAL DECOMPRESSION AND DISTAL CLAVICLE EXCISION;  Surgeon: Meredith Pel, MD;  Location: [facility];  Service: Orthopedics;  Laterality: Right;  . TONSILLECTOMY    . ulner nerve        Current Outpatient Prescriptions  Medication Sig Dispense Refill  . albuterol (PROVENTIL HFA;VENTOLIN HFA) 108 (90  BASE) MCG/ACT inhaler Inhale 2 puffs into the lungs every 6 (six) hours as needed for wheezing or shortness of breath. 1 Inhaler 2  . benazepril (LOTENSIN) 20 MG tablet Take 1 tablet (20 mg total) by mouth daily. 90 tablet 3  . exenatide (BYETTA 5 MCG PEN) 5 MCG/0.02ML SOPN injection Inject 0.02 mLs (5 mcg total) into the skin 2 (two) times daily with a meal. 1.2 mL 3  . gabapentin (NEURONTIN) 300 MG capsule TAKE 3 CAPSULES BY MOUTH 3 TIMES A DAY 270 capsule 0  . metFORMIN (GLUCOPHAGE) 1000 MG tablet TAKE 1 TABLET BY MOUTH 2 TIMES DAILY WITH A MEAL. 60 tablet 3  . methocarbamol (ROBAXIN) 500 MG tablet Take 1 tablet (500 mg total) by mouth 4 (four) times daily. 30 tablet 0  . metoprolol succinate (TOPROL XL) 25 MG 24 hr tablet Take 1 tablet (25 mg total) by mouth daily. 30 tablet 11  . Multiple Vitamins-Minerals (WOMENS 50+ MULTI VITAMIN/MIN PO) Take 1 tablet by mouth daily.    . nabumetone (RELAFEN) 750 MG tablet TAKE 1 TABLET (750 MG TOTAL) BY MOUTH 2 (TWO) TIMES DAILY. 60 tablet 1  . rizatriptan (MAXALT-MLT) 10 MG disintegrating tablet Take 1 tablet (10 mg total) by mouth as needed for migraine. May repeat in 2 hours if needed 9 tablet 11  . topiramate (TOPAMAX) 50 MG tablet Take 1 tablet (50 mg total) by mouth 2 (two) times daily. 60 tablet 12  . traZODone (DESYREL) 150  MG tablet Take 2 tablets (300 mg total) by mouth at bedtime. 180 tablet 5   Current Facility-Administered Medications  Medication Dose Route Frequency Provider Last Rate Last Dose  . betamethasone acetate-betamethasone sodium phosphate (CELESTONE) injection 3 mg  3 mg Intramuscular Once Edrick Kins, DPM      . betamethasone acetate-betamethasone sodium phosphate (CELESTONE) injection 3 mg  3 mg Intramuscular Once Edrick Kins, DPM        Allergies:   Shrimp [shellfish allergy]   Social History:  The patient  reports that she has never smoked. She has never used smokeless tobacco. She reports that she does not drink alcohol  or use drugs.   Family History:  The patient's  family history includes Cancer in her mother; Diabetes in her daughter and maternal grandmother; Hypertension in her maternal grandmother and mother.    ROS:  Please see the history of present illness.   All other systems are personally reviewed and negative.    PHYSICAL EXAM: VS:  BP 124/74   Pulse (!) 32   Ht 5\' 6"  (1.676 m)   Wt 205 lb (93 kg)   LMP  (LMP Unknown)   SpO2 98%   BMI 33.09 kg/m  , BMI Body mass index is 33.09 kg/m. GEN: Well nourished, well developed, in no acute distress  HEENT: normal  Neck: no JVD, carotid bruits, or masses Cardiac: RRR with ectopy in bigeminy; no murmurs, rubs, or gallops,no edema  Respiratory:  clear to auscultation bilaterally, normal work of breathing GI: soft, nontender, nondistended, + BS MS: no deformity or atrophy  Skin: warm and dry  Neuro:  Strength and sensation are intact Psych: euthymic mood, full affect  EKG:  EKG is not ordered today. The ekg 07/28/16 reveals sinus bradycardia with PVCs in bigeminy (LBB inferior axis) with transition abruptly at V3.   Recent Labs: 07/25/2016: ALT 20; Hemoglobin 13.4; Platelets 124 08/05/2016: BUN 14; Creatinine, Ser 1.10; Potassium 4.8; Sodium 144; TSH 2.730  personally reviewed   Lipid Panel     Component Value Date/Time   CHOL 131 06/16/2014 0938   TRIG 197 (H) 06/16/2014 0938   HDL 34 (L) 06/16/2014 0938   CHOLHDL 3.9 06/16/2014 0938   VLDL 39 06/16/2014 0938   LDLCALC 58 06/16/2014 0938   personally reviewed   Wt Readings from Last 3 Encounters:  09/09/16 205 lb (93 kg)  07/28/16 211 lb (95.7 kg)  07/25/16 211 lb (95.7 kg)      Other studies personally reviewed: Additional studies/ records that were reviewed today include: Dr Morton Amy notes, prior echo, event monitor are all personally reviewed  Review of the above records today demonstrates: as above   ASSESSMENT AND PLAN:  1.  PVCs The patient has very frequent PVCs  with symptoms of fatigue and decreased exercise tolerance.  Therapeutic strategies for symptomatic PVCs including medicine and ablation were discussed in detail with the patient today. Risk, benefits, and alternatives to EP study and radiofrequency ablation were also discussed in detail today. These risks include but are not limited to stroke, bleeding, vascular damage, tamponade, perforation, damage to the heart and other structures, AV block requiring pacemaker, worsening renal function, and death. The patient understands these risk and wishes to proceed.  We will therefore proceed with catheter ablation at the next available time.  Carto, Ice and anesthesia are requested for the procedure.  2. HTN Stable No change required today    Current medicines are reviewed at length with the  patient today.   The patient does not have concerns regarding her medicines.  The following changes were made today:  none  Signed, [name], MD  09/09/2016 11:06 AM     [facility] [address] [place] [address] [phone] (office) [phone] (fax)

## 2016-09-27 NOTE — Telephone Encounter (Signed)
Okay with me.  Thanks, Dr. Amalia Hailey

## 2016-09-27 NOTE — Anesthesia Postprocedure Evaluation (Addendum)
Anesthesia Post Note  Patient: Briana Alvarez  Procedure(s) Performed: Procedure(s) (LRB): V Tach Ablation (N/A)  Patient location during evaluation: PACU Anesthesia Type: MAC Level of consciousness: awake and alert Pain management: pain level controlled Vital Signs Assessment: post-procedure vital signs reviewed and stable Respiratory status: spontaneous breathing, nonlabored ventilation, respiratory function stable and patient connected to nasal cannula oxygen Cardiovascular status: stable and blood pressure returned to baseline Anesthetic complications: no       Last Vitals:  Vitals:   09/27/16 1440 09/27/16 1445  BP: (!) 139/99 (!) 164/83  Pulse: (!) 27 (!) 35  Resp: 15 12  Temp:      Last Pain:  Vitals:   09/27/16 1434  TempSrc: Temporal                 Alajah Witman

## 2016-09-27 NOTE — Progress Notes (Addendum)
Site area: Right groin, 6, 7, 8 FR venous sheaths pulled Site prior: level 0 Manual Pressure applied for: 20 min Patient Status During pull: Calm, relaxed Post Pull site: level 0 Post pull instructions given Post pull pulses present: Yes Dressing applied Bedrest begins at: 15:20 Comments: Vital signs stable throughout, no complaints.  Post pull vitals HR: 64, Ventricular Bigeminy RR: 14 BP: 160/91 O2: 97% RA  Voided 500 ml clear yellow urine

## 2016-09-27 NOTE — Interval H&P Note (Signed)
History and Physical Interval Note:  09/27/2016 11:39 AM  Briana Alvarez  has presented today for surgery, with the diagnosis of VT  The various methods of treatment have been discussed with the patient and family. After consideration of risks, benefits and other options for treatment, the patient has consented to  Procedure(s): V Tach Ablation (N/A) as a surgical intervention .  The patient's history has been reviewed, patient examined, no change in status, stable for surgery.  I have reviewed the patient's chart and labs.  Questions were answered to the patient's satisfaction.     [name]

## 2016-09-27 NOTE — Discharge Summary (Signed)
ELECTROPHYSIOLOGY PROCEDURE DISCHARGE SUMMARY    Patient ID: Briana Alvarez,  MRN: [id number], DOB/AGE: 1960-08-12 56 y.o.  Admit date: 09/27/2016 Discharge date: 09/28/2016  Primary Care Physician: Elizabeth Palau, FNP Primary Cardiologist: Eldridge Dace Electrophysiologist: Neosha Switalski  Primary Discharge Diagnosis:  1.  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 Shrimp [Shellfish Allergy] Anaphylaxis and Hives    Shrimp - hives    Procedures This Admission:  1.  Electrophysiology study and radiofrequency catheter ablation on 09/27/16 by Dr Johney Frame. 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  Brief HPI:  Cherye Handcock is a 56 y.o. female female with a past medical history significant for PVC's, hypertension, depression, and diabetes. She has had worsening fatigue and exercise intolerance. She was found to have 41% PVC's by holter monitor and was referred to EP. Risks, benefits to ablation were reviewed with the patient who wished to proceed.   Hospital Course:  The patient was admitted and underwent EPS/ablation with details as outlined above. She was monitored on telemetry overnight which demonstrated sinus rhythm with occasional PVCs (significantly less than her previous baseline). Groin was without complication.  She was seen by Dr Johney Frame and considered stable for discharge to home.  BP was elevated.  Metoprolol was discontinued due to bradycardia and amlodipine 5mg  daily was initiated.  Physical Exam: Vitals:   09/27/16 1535 09/27/16 1625 09/27/16 2048 09/28/16 0601  BP: (!) 161/63 (!) 158/84 125/71 (!) 167/84  Pulse: 62 (!) 34 66 (!) 50  Resp: 12 12 14 14   Temp:  97.6 F (36.4 C) 98.2 F (36.8 C) 97.8 F (36.6 C)  TempSrc:  Oral Oral Oral  SpO2: 97% 95% 99% 100%  Weight:      Height:        GEN- The patient is well appearing, alert and oriented x 3 today.   HEENT: normocephalic,  atraumatic; sclera clear, conjunctiva pink; hearing intact; oropharynx clear; neck supple  Lungs- Clear to ausculation bilaterally, normal work of breathing.  No wheezes, rales, rhonchi Heart- Regular rate and rhythm  GI- soft, non-tender, non-distended, bowel sounds present  Extremities- no clubbing, cyanosis, or edema; DP/PT/radial pulses 2+ bilaterally MS- no significant deformity or atrophy Skin- warm and dry, no rash or lesion Psych- euthymic mood, full affect Neuro- strength and sensation are intact    Labs:   Lab Results  Component Value Date   WBC 5.6 09/19/2016   HGB 13.4 07/25/2016   HCT 37.7 09/19/2016   MCV 95 09/19/2016   PLT 136 (L) 09/19/2016   No results for input(s): NA, K, CL, CO2, BUN, CREATININE, CALCIUM, PROT, BILITOT, ALKPHOS, ALT, AST, GLUCOSE in the last 168 hours.  Invalid input(s): LABALBU   Discharge Medications:  Current Discharge Medication List    START taking these medications   Details  amLODipine (NORVASC) 5 MG tablet Take 1 tablet (5 mg total) by mouth daily. Qty: 30 tablet, Refills: 2      CONTINUE these medications which have NOT CHANGED   Details  benazepril (LOTENSIN) 20 MG tablet Take 1 tablet (20 mg total) by mouth daily. Qty: 90 tablet, Refills: 3    exenatide (BYETTA 5 MCG PEN) 5 MCG/0.02ML SOPN injection Inject 0.02 mLs (5 mcg total) into the skin 2 (two) times daily with a meal. Qty: 1.2 mL, Refills: 3   Associated Diagnoses: Controlled type 2 diabetes mellitus without complication, without long-term current use of insulin (HCC)  gabapentin (NEURONTIN) 300 MG capsule TAKE 3 CAPSULES BY MOUTH 3 TIMES A DAY Qty: 270 capsule, Refills: 0    metFORMIN (GLUCOPHAGE) 1000 MG tablet TAKE 1 TABLET BY MOUTH 2 TIMES DAILY WITH A MEAL. Qty: 60 tablet, Refills: 3    Multiple Vitamins-Minerals (WOMENS 50+ MULTI VITAMIN/MIN PO) Take 1 tablet by mouth daily.    nabumetone (RELAFEN) 750 MG tablet TAKE 1 TABLET (750 MG TOTAL) BY MOUTH 2  (TWO) TIMES DAILY. Qty: 60 tablet, Refills: 1    rizatriptan (MAXALT-MLT) 10 MG disintegrating tablet Take 1 tablet (10 mg total) by mouth as needed for migraine. May repeat in 2 hours if needed Qty: 9 tablet, Refills: 11    topiramate (TOPAMAX) 50 MG tablet Take 1 tablet (50 mg total) by mouth 2 (two) times daily. Qty: 60 tablet, Refills: 12    traZODone (DESYREL) 150 MG tablet Take 2 tablets (300 mg total) by mouth at bedtime. Qty: 180 tablet, Refills: 5   Associated Diagnoses: Insomnia    albuterol (PROVENTIL HFA;VENTOLIN HFA) 108 (90 BASE) MCG/ACT inhaler Inhale 2 puffs into the lungs every 6 (six) hours as needed for wheezing or shortness of breath. Qty: 1 Inhaler, Refills: 2      STOP taking these medications     metoprolol succinate (TOPROL XL) 25 MG 24 hr tablet         Disposition: Pt is being discharged home today in good condition.  Follow-up Information    Hillis Range, MD Follow up on 10/31/2016.   Specialty:  Cardiology Why:  at 8:45AM  Contact information: [address] [place] [address] [phone]           Duration of Discharge Encounter: Greater than 30 minutes including physician time.  Randolm Idol MD 09/28/2016 7:59 AM

## 2016-09-27 NOTE — Anesthesia Preprocedure Evaluation (Addendum)
Anesthesia Evaluation  Patient identified by MRN, date of birth, ID band Patient awake    Airway Mallampati: I  TM Distance: >3 FB Neck ROM: Full    Dental  (+) Upper Dentures, Lower Dentures, Dental Advisory Given   Pulmonary    Pulmonary exam normal        Cardiovascular hypertension, Pt. on medications Normal cardiovascular exam+ dysrhythmias      Neuro/Psych  Headaches, Anxiety Depression Bipolar Disorder    GI/Hepatic negative GI ROS, Neg liver ROS,   Endo/Other  diabetes, Type 2, Oral Hypoglycemic Agents  Renal/GU negative Renal ROS     Musculoskeletal  (+) Arthritis ,   Abdominal Normal abdominal exam  (+)   Peds  Hematology negative hematology ROS (+)   Anesthesia Other Findings   Reproductive/Obstetrics                            Anesthesia Physical Anesthesia Plan  ASA: III  Anesthesia Plan: MAC   Post-op Pain Management:    Induction: Intravenous  Airway Management Planned: Simple Face Mask and Natural Airway  Additional Equipment:   Intra-op Plan:   Post-operative Plan:   Informed Consent: I have reviewed the patients History and Physical, chart, labs and discussed the procedure including the risks, benefits and alternatives for the proposed anesthesia with the patient or authorized representative who has indicated his/her understanding and acceptance.     Plan Discussed with: Surgeon and CRNA  Anesthesia Plan Comments:        Anesthesia Quick Evaluation

## 2016-09-27 NOTE — Anesthesia Procedure Notes (Signed)
Procedure Name: MAC Date/Time: 09/27/2016 12:10 PM Performed by: Verdie Drown Pre-anesthesia Checklist: Emergency Drugs available, Patient identified, Suction available, Patient being monitored and Timeout performed Patient Re-evaluated:Patient Re-evaluated prior to inductionOxygen Delivery Method: Nasal cannula

## 2016-09-28 ENCOUNTER — Encounter (HOSPITAL_COMMUNITY): Payer: Self-pay | Admitting: Internal Medicine

## 2016-09-28 DIAGNOSIS — I10 Essential (primary) hypertension: Secondary | ICD-10-CM

## 2016-09-28 DIAGNOSIS — I493 Ventricular premature depolarization: Secondary | ICD-10-CM

## 2016-09-28 DIAGNOSIS — Z91013 Allergy to seafood: Secondary | ICD-10-CM | POA: Diagnosis not present

## 2016-09-28 DIAGNOSIS — I472 Ventricular tachycardia: Secondary | ICD-10-CM | POA: Diagnosis not present

## 2016-09-28 MED ORDER — AMLODIPINE BESYLATE 5 MG PO TABS
5.0000 mg | ORAL_TABLET | Freq: Every day | ORAL | Status: DC
  Administered 2016-09-28: 5 mg via ORAL
  Filled 2016-09-28: qty 1

## 2016-09-28 MED ORDER — AMLODIPINE BESYLATE 5 MG PO TABS: 5.0000 mg | ORAL_TABLET | Freq: Every day | ORAL | 2 refills | Status: DC

## 2016-09-28 MED ORDER — NABUMETONE 500 MG PO TABS
750.0000 mg | ORAL_TABLET | Freq: Two times a day (BID) | ORAL | Status: DC
  Administered 2016-09-28: 750 mg via ORAL
  Filled 2016-09-28: qty 2

## 2016-09-28 NOTE — Progress Notes (Signed)
Order received to discharge patient.  Telemetry monitor removed and CCMD notified.  PIV access removed.  Discharge instructions, follow up, medications and instructions for their use were discussed with patient. 

## 2016-09-30 MED ORDER — NABUMETONE 750 MG PO TABS: 750.0000 mg | ORAL_TABLET | Freq: Every day | ORAL | 0 refills | Status: DC

## 2016-10-07 ENCOUNTER — Telehealth: Payer: Self-pay | Admitting: Internal Medicine

## 2016-10-07 ENCOUNTER — Encounter: Payer: Self-pay | Admitting: *Deleted

## 2016-10-07 NOTE — Telephone Encounter (Signed)
Returned call to patient and let her know Chanetta Marshall, NP recommended stopping the Amlodipine.  She should watch her salt intake and keep legs elevated as much as possible.  She states she has not had a BM since the surgery.  I have asked her to try OTC Metamucil to see if this helps.  She will keep a BP log and see how her BP is with the discontinuation of Amlodipine. After discharge she did tell me she returned to work the next day.  She works at [employer] in housekeeping.  This could be part of the problem as she has been up a lot and on her feet with little rest.  I will provide a note for her to be out of work 4/19 and 4/20 and return on 4/23.  She will call me back if needed.

## 2016-10-07 NOTE — Telephone Encounter (Signed)
New Message     Pt c/o swelling: STAT is pt has developed SOB within 24 hours  1. How long have you been experiencing swelling? Since she had the ablation  2. Where is the swelling located? Hands legs and feet  3.  Are you currently taking a "fluid pill" no  4.  Are you currently SOB? no  5.  Have you traveled recently? No  Pt also states she is constipated

## 2016-10-11 ENCOUNTER — Encounter: Payer: Self-pay | Admitting: Internal Medicine

## 2016-10-17 ENCOUNTER — Ambulatory Visit: Payer: Medicaid Other | Admitting: Podiatry

## 2016-10-24 ENCOUNTER — Ambulatory Visit: Payer: Medicaid Other | Admitting: Internal Medicine

## 2016-10-31 ENCOUNTER — Ambulatory Visit: Payer: Medicaid Other | Admitting: Internal Medicine

## 2016-10-31 ENCOUNTER — Other Ambulatory Visit: Payer: Self-pay

## 2016-11-01 ENCOUNTER — Encounter: Payer: Self-pay | Admitting: Internal Medicine

## 2016-11-20 NOTE — Progress Notes (Signed)
Cardiology Office Note Date:  11/21/2016  Patient ID:  Briana Alvarez, DOB 11/16/1960, MRN [id number] PCP:  Vicenta Aly, [place]  Cardiologist:  Dr. Irish Lack Electrophysiologist: Dr. Rayann Heman   Chief Complaint:  f/uappointment  History of Present Illness: Briana Alvarez is a 56 y.o. female with history of PVCs s/p EPS/ablation in April by Dr. Rayann Heman, HTN, DM, depression/anxiety.  She comes in today to be seen for Dr. Rayann Heman, last seen by him at the time of her procedure.  At discharge her metoprolol was discontinued 2/2 bradycardia and amlodipine started.  Subsequently the pt called with c/o hands/feet swelling, ?SOB, constipation, telephone notes report her swelling was felt possibly multifactorial, being on her feet much of the time, the amlodipine was held and instructed to keep BP log.    She reports the swelling is better off the amlodipine, her BP has been good, in fact her benazepril dose reduced and since she reports she has done well with her BP.  She unfortunatley is under a recent tremendous personal stress (having current custody of her grandchildren that were removed from her son's care including a 91 day old infant and 2 teens) this has her under a great deal of stress.  She is tired, never feels like she has much energy, working 3rd shift at [employer] job.  She denies any CP or SOB, no dizziness, near syncope.  She thinks her fatigue is similar to prior to her ablation, though knows her current personal issues contribute.   She does not feel palpitations (did not previously either)  Past Medical History:  Diagnosis Date  . Anxiety   . Arthritis   . Depression   . Diabetes mellitus   . Headache    "2 a month"  . Hypertension   . Neuropathy   . Pneumonia     Past Surgical History:  Procedure Laterality Date  . ABDOMINAL HYSTERECTOMY    . BREAST EXCISIONAL BIOPSY  2001   LEFT BREAST  . CARPAL TUNNEL RELEASE    . KNEE ARTHROSCOPY Right   . KNEE SURGERY Left   .  NECK SURGERY    . SHOULDER ARTHROSCOPY    . SHOULDER ARTHROSCOPY WITH SUBACROMIAL DECOMPRESSION AND BICEP TENDON REPAIR Right 02/15/2016   Procedure: SHOULDER DIAGNOSTIC OPERATIVE ARTHROSCOPY WITH SUBACROMIAL DECOMPRESSION AND DISTAL CLAVICLE EXCISION;  Surgeon: Meredith Pel, MD;  Location: [facility];  Service: Orthopedics;  Laterality: Right;  . TONSILLECTOMY    . ulner nerve     . V TACH ABLATION N/A 09/27/2016   Procedure: V Tach Ablation;  Surgeon: [name], MD;  Location: [facility];  Service: Cardiovascular;  Laterality: N/A;    Current Outpatient Prescriptions  Medication Sig Dispense Refill  . albuterol (PROVENTIL HFA;VENTOLIN HFA) 108 (90 BASE) MCG/ACT inhaler Inhale 2 puffs into the lungs every 6 (six) hours as needed for wheezing or shortness of breath. 1 Inhaler 2  . benazepril (LOTENSIN) 20 MG tablet Take 1 tablet (20 mg total) by mouth daily. (Patient taking differently: Take 10 mg by mouth daily. ) 90 tablet 3  . exenatide (BYETTA 5 MCG PEN) 5 MCG/0.02ML SOPN injection Inject 0.02 mLs (5 mcg total) into the skin 2 (two) times daily with a meal. 1.2 mL 3  . gabapentin (NEURONTIN) 300 MG capsule TAKE 3 CAPSULES BY MOUTH 3 TIMES A DAY 270 capsule 0  . metFORMIN (GLUCOPHAGE) 1000 MG tablet TAKE 1 TABLET BY MOUTH 2 TIMES DAILY WITH A MEAL. 60 tablet 3  .  Multiple Vitamins-Minerals (WOMENS 50+ MULTI VITAMIN/MIN PO) Take 1 tablet by mouth daily.    . nabumetone (RELAFEN) 750 MG tablet TAKE 1 TABLET (750 MG TOTAL) BY MOUTH 2 (TWO) TIMES DAILY. 60 tablet 1  . nabumetone (RELAFEN) 750 MG tablet Take 1 tablet (750 mg total) by mouth daily. 60 tablet 0  . rizatriptan (MAXALT-MLT) 10 MG disintegrating tablet Take 1 tablet (10 mg total) by mouth as needed for migraine. May repeat in 2 hours if needed 9 tablet 11  . topiramate (TOPAMAX) 50 MG tablet Take 1 tablet (50 mg total) by mouth 2 (two) times daily. 60 tablet 12  . traZODone (DESYREL) 150 MG tablet Take 2 tablets (300 mg  total) by mouth at bedtime. 180 tablet 5   Current Facility-Administered Medications  Medication Dose Route Frequency Provider Last Rate Last Dose  . betamethasone acetate-betamethasone sodium phosphate (CELESTONE) injection 3 mg  3 mg Intramuscular Once Daylene Katayama M, DPM      . betamethasone acetate-betamethasone sodium phosphate (CELESTONE) injection 3 mg  3 mg Intramuscular Once Edrick Kins, DPM        Allergies:   Shrimp [shellfish allergy]   Social History:  The patient  reports that she has never smoked. She has never used smokeless tobacco. She reports that she does not drink alcohol or use drugs.   Family History:  The patient's family history includes Cancer in her mother; Diabetes in her daughter and maternal grandmother; Hypertension in her maternal grandmother and mother.  ROS:  Please see the history of present illness.    All other systems are reviewed and otherwise negative.   PHYSICAL EXAM:  VS:  BP 122/82   Pulse 77   Ht 5\' 7"  (1.702 m)   Wt 196 lb (88.9 kg)   LMP  (LMP Unknown)   BMI 30.70 kg/m  BMI: Body mass index is 30.7 kg/m. Well nourished, well developed, in no acute distress  HEENT: normocephalic, atraumatic  Neck: no JVD, carotid bruits or masses Cardiac:  RRR, extrasystoles appreciated, no murmurs, gallops or rubs RR; no significant murmurs, no rubs, or gallops Lungs:  CTA b/l, no wheezing, rhonchi or rales  Abd: soft, nontender MS: no deformity or atrophy Ext:  no edema, she has support stockings on  Skin: warm and dry, no rash Neuro:  No gross deficits appreciated Psych: euthymic mood, full affect     EKG: SR PVC's, bigeminy, appear same as previous  Done 07/28/16 reveals sinus bradycardia with PVCs in bigeminy (LBB inferior axis) with transition abruptly at V3  09/27/16: EPS, Dr. Rayann Heman CONCLUSIONS:  1. Sinus rhythm with bigeminal PVCs upon presentation.  2. 3D mapping revealed that the PVC arose from the anteroseptal RVOT  3.  Successful radiofrequency modification of the PVC focus 4. No inducible arrhythmias following ablation with isuprel infused 5. No early apparent complications.   06/30/16: TTE Study Conclusions - Left ventricle: The cavity size was normal. Wall thickness was   normal. Systolic function was normal. The estimated ejection   fraction was in the range of 55% to 60%. Wall motion was normal;   there were no regional wall motion abnormalities. - Aortic valve: There was trivial regurgitation. - Right atrium: The atrium was mildly dilated. - Pulmonary arteries: Systolic pressure was mildly to moderately   increased. PA peak pressure: 40 mm Hg (S).   Recent Labs: 07/25/2016: ALT 20; Hemoglobin 13.4 08/05/2016: TSH 2.730 09/19/2016: BUN 23; Creatinine, Ser 1.12; Platelets 136; Potassium 4.4; Sodium 143  No results found for requested labs within last 8760 hours.   CrCl cannot be calculated (Patient's most recent lab result is older than the maximum 21 days allowed.).   Wt Readings from Last 3 Encounters:  11/21/16 196 lb (88.9 kg)  09/27/16 208 lb (94.3 kg)  09/09/16 205 lb (93 kg)     Other studies reviewed: Additional studies/records reviewed today include: summarized above  ASSESSMENT AND PLAN:  1. Symptomatic PVC's     S/p Ablation with Dr. Rayann Heman as noted above     Bigeminy again today  Will hold off change in medicine, BB did not seem to reduce them historically and discharge summary mentions stopping BB 2/2 bradycardia  Discussed with the patient, with her current personal issues she isn't certain about repeat ablation, she would like to wear a holter monitor and if indeed they continue so frequently she would like to re-visit with Dr. Rayann Heman.  2. HTN     Looks good today, home BP reported as similar number  Disposition: F/u with 48 hour monitor and Dr. Rayann Heman afterwards  Current medicines are reviewed at length with the patient today.  The patient did not have any concerns  regarding medicines.  Haywood Lasso, PA-C 11/21/2016 9:37 AM     [address] [place] [address] [phone] (office)  [phone] (fax)

## 2016-11-21 ENCOUNTER — Ambulatory Visit (INDEPENDENT_AMBULATORY_CARE_PROVIDER_SITE_OTHER): Payer: Medicaid Other | Admitting: Physician Assistant

## 2016-11-21 ENCOUNTER — Ambulatory Visit: Payer: Medicaid Other | Admitting: Podiatry

## 2016-11-21 VITALS — BP 122/82 | HR 77 | Ht 67.0 in | Wt 196.0 lb

## 2016-11-21 DIAGNOSIS — I493 Ventricular premature depolarization: Secondary | ICD-10-CM

## 2016-11-21 DIAGNOSIS — R5383 Other fatigue: Secondary | ICD-10-CM

## 2016-11-21 DIAGNOSIS — I10 Essential (primary) hypertension: Secondary | ICD-10-CM

## 2016-11-21 NOTE — Patient Instructions (Addendum)
Medication Instructions:   Your physician recommends that you continue on your current medications as directed. Please refer to the Current Medication list given to you today.   If you need a refill on your cardiac medications before your next appointment, please call your pharmacy.  Labwork: NONE ORDERED  TODAY    Testing/Procedures: Your physician has recommended that you wear a holter monitor. Holter monitors are medical devices that record the heart's electrical activity. Doctors most often use these monitors to diagnose arrhythmias. Arrhythmias are problems with the speed or rhythm of the heartbeat. The monitor is a small, portable device. You can wear one while you do your normal daily activities. This is usually used to diagnose what is causing palpitations/syncope (passing out).   Follow-Up:  WITH ALLRED IN 6 WEEKS OR WITH URSUY ON A DAY ALLRED IN OFFICE    Any Other Special Instructions Will Be Listed Below (If Applicable).

## 2016-11-21 NOTE — Addendum Note (Signed)
Addendum  created 11/21/16 1341 by Oleta Mouse, MD   Sign clinical note

## 2016-11-23 MED ORDER — NABUMETONE 750 MG PO TABS: 750.0000 mg | ORAL_TABLET | Freq: Two times a day (BID) | ORAL | 0 refills | Status: DC

## 2016-11-28 ENCOUNTER — Encounter: Payer: Self-pay | Admitting: Podiatry

## 2016-11-28 ENCOUNTER — Ambulatory Visit (INDEPENDENT_AMBULATORY_CARE_PROVIDER_SITE_OTHER): Payer: Medicaid Other | Admitting: Podiatry

## 2016-11-28 DIAGNOSIS — G5750 Tarsal tunnel syndrome, unspecified lower limb: Secondary | ICD-10-CM

## 2016-11-28 DIAGNOSIS — M7741 Metatarsalgia, right foot: Secondary | ICD-10-CM

## 2016-11-28 DIAGNOSIS — M7742 Metatarsalgia, left foot: Secondary | ICD-10-CM

## 2016-12-05 ENCOUNTER — Ambulatory Visit (INDEPENDENT_AMBULATORY_CARE_PROVIDER_SITE_OTHER): Payer: Medicaid Other

## 2016-12-05 DIAGNOSIS — R5383 Other fatigue: Secondary | ICD-10-CM | POA: Diagnosis not present

## 2016-12-05 DIAGNOSIS — I493 Ventricular premature depolarization: Secondary | ICD-10-CM

## 2016-12-07 NOTE — Progress Notes (Signed)
Subjective: Patient presents today status post EPF surgery to the bilateral foot. Date of surgery 03/10/2016. Patient states that her pain has worsened. She reports having cardiac surgery on 09/27/16.  Objective: Physical Exam General: The patient is alert and oriented x3 in no acute distress.  Dermatology: Skin is cool, dry and supple bilateral lower extremities. Negative for open lesions or macerations.  Vascular: Palpable pedal pulses bilaterally. No edema or erythema noted. Capillary refill within normal limits.  Neurological: Epicritic and protective threshold grossly intact bilaterally.   Musculoskeletal Exam: Electrical pain sensations upon percussion of the tibial nerve to the bilateral lower extremities consistent with a tarsal tunnel syndrome. There is a symptomatic painful soft tissue mass also noted to the medial longitudinal arch of the right plantar midfoot. Pain on palpation and range of motion also noted diffusely throughout the metatarsal phalangeal joints 1-5 bilateral.    Negative for pain on palpation to the medial calcaneal tubercle the right foot at the insertion of the plantar fascia.  Assessment:  #1 tarsal tunnel syndrome bilateral #2 metatarsalgia bilateral  #3 status post EPF surgery bilateral. DOS: 03/10/2016.  Plan of care: #1 patient was evaluated #2 Injection of 0.5 mLs Celestone Soluspan injected into tarsal tunnel the bilateral feet #3 plantar fasciitis braces dispensed bilaterally. #4 return to clinic in 4 weeks.   Edrick Kins, DPM [facility]  Dr. Edrick Kins, [address]                                        [place], [place] [address]                Office [phone]  Fax [phone]

## 2016-12-12 MED ORDER — BETAMETHASONE SOD PHOS & ACET 6 (3-3) MG/ML IJ SUSP: 3.0000 mg | Freq: Once | INTRAMUSCULAR | Status: AC

## 2016-12-19 ENCOUNTER — Encounter: Payer: Self-pay | Admitting: Podiatry

## 2016-12-19 ENCOUNTER — Ambulatory Visit (INDEPENDENT_AMBULATORY_CARE_PROVIDER_SITE_OTHER): Payer: Medicaid Other | Admitting: Podiatry

## 2016-12-19 ENCOUNTER — Other Ambulatory Visit: Payer: Self-pay

## 2016-12-19 DIAGNOSIS — L600 Ingrowing nail: Secondary | ICD-10-CM

## 2016-12-19 DIAGNOSIS — G5752 Tarsal tunnel syndrome, left lower limb: Secondary | ICD-10-CM

## 2016-12-19 DIAGNOSIS — G5751 Tarsal tunnel syndrome, right lower limb: Secondary | ICD-10-CM

## 2016-12-19 DIAGNOSIS — G5750 Tarsal tunnel syndrome, unspecified lower limb: Secondary | ICD-10-CM

## 2016-12-19 MED ORDER — DOXYCYCLINE HYCLATE 100 MG PO TABS: 100.0000 mg | ORAL_TABLET | Freq: Two times a day (BID) | ORAL | 0 refills | Status: DC

## 2016-12-19 MED ORDER — DICLOFENAC SODIUM 75 MG PO TBEC: 75.0000 mg | DELAYED_RELEASE_TABLET | Freq: Two times a day (BID) | ORAL | 1 refills | Status: DC

## 2016-12-19 NOTE — Patient Instructions (Signed)

## 2016-12-19 NOTE — Progress Notes (Signed)
Subjective: Patient presents today for follow-up treatment and evaluation of bilateral tarsal tunnel syndrome. Patient states that the injections did not help very much. She is in the same amount of pain today. She also has a new complaint of an ingrown toenail to the left great toe medial border. She states the ingrown toenails have been there for approximately 1 month.  Objective: Physical Exam General: The patient is alert and oriented x3 in no acute distress.  Dermatology: Paronychia with ingrown toenail noted to the medial border of the left great toe. There is some periungual erythema as well as alumina drainage noted to the medial border. Skin is cool, dry and supple bilateral lower extremities. Negative for open lesions or macerations.  Vascular: Palpable pedal pulses bilaterally. No edema or erythema noted. Capillary refill within normal limits.  Neurological: Epicritic and protective threshold grossly intact bilaterally.   Musculoskeletal Exam: Electrical pain sensations upon percussion of the tibial nerve to the bilateral lower extremities consistent with a tarsal tunnel syndrome and positive Tinel sign.  Negative for pain on palpation to the medial calcaneal tubercle the right foot at the insertion of the plantar fascia.  Assessment:  #1 tarsal tunnel syndrome bilateral #2 ingrown toenail left great toe medial border  #3 status post EPF surgery bilateral. DOS: 03/10/2016.  Plan of care: #1 patient was evaluated #2 today were going to order an MRI of the bilateral ankles to dress nonprogressive tarsal tunnel syndrome. This pain is been ongoing for several months without any satisfactory alleviation of symptoms for the patient. There's been no improvement in symptoms. #3 partial permanent nail avulsion procedure was performed to the left great toe medial border. The toe was prepped in an aseptic manner and 3 mL of lidocaine 2% with 0.5% Marcaine plain infiltrated in the left great  toe hallux block fashion. 324 seconds application of phenol followed by alcohol flush. Dry sterile dressing was applied to the left great toe. #4 prescription for doxycycline, her milligrams  #5 return to clinic in 2 weeks   Edrick Kins, DPM [facility]  Dr. Edrick Kins, [address] [place]                                        [place], [place] [address]                Office [phone]  Fax [phone]

## 2017-01-01 ENCOUNTER — Inpatient Hospital Stay: Admission: RE | Admit: 2017-01-01 | Payer: Medicaid Other | Source: Ambulatory Visit

## 2017-01-01 ENCOUNTER — Other Ambulatory Visit: Payer: Medicaid Other

## 2017-01-09 ENCOUNTER — Ambulatory Visit (INDEPENDENT_AMBULATORY_CARE_PROVIDER_SITE_OTHER): Payer: Medicaid Other | Admitting: Internal Medicine

## 2017-01-09 ENCOUNTER — Encounter: Payer: Self-pay | Admitting: Internal Medicine

## 2017-01-09 ENCOUNTER — Ambulatory Visit: Payer: Medicaid Other | Admitting: Physician Assistant

## 2017-01-09 VITALS — BP 138/80 | HR 70 | Ht 66.5 in | Wt 191.8 lb

## 2017-01-09 DIAGNOSIS — I493 Ventricular premature depolarization: Secondary | ICD-10-CM | POA: Diagnosis not present

## 2017-01-09 DIAGNOSIS — I10 Essential (primary) hypertension: Secondary | ICD-10-CM | POA: Diagnosis not present

## 2017-01-09 MED ORDER — NADOLOL 20 MG PO TABS: ORAL_TABLET | ORAL | 11 refills | Status: DC

## 2017-01-09 MED ORDER — FLECAINIDE ACETATE 50 MG PO TABS: 50.0000 mg | ORAL_TABLET | Freq: Two times a day (BID) | ORAL | 11 refills | Status: DC

## 2017-01-09 NOTE — Patient Instructions (Addendum)
Medication Instructions:  Your physician has recommended you make the following change in your medication:  1) Start Flecainide 50 mg twice daily 2) Start Nadolol 10 mg once daily (1/2 of 20 mg tablet)    Labwork: None ordered   Testing/Procedures: None ordered   Follow-Up: Your physician wants you to follow-up: 9:00 am on Thursday 7/26 for an EKG and in 4-6 weeks with Dr. Rayann Heman    Any Other Special Instructions Will Be Listed Below (If Applicable).     If you need a refill on your cardiac medications before your next appointment, please call your pharmacy.

## 2017-01-09 NOTE — Progress Notes (Signed)
PCP: Vicenta Aly, [facility] Cardiologist: Dr Jerrell Mylar is a 56 y.o. female who presents today for routine electrophysiology followup.  She has had return of PVCs post ablation. She has fatigue as well as SOB with moderate activity.  She denies procedure related complications.  She continues to work at [employer] without difficulty.  Today, she denies symptoms of palpitations, chest pain,  lower extremity edema, dizziness, presyncope, or syncope.  The patient is otherwise without complaint today.   Past Medical History:  Diagnosis Date  . Anxiety   . Arthritis   . Depression   . Diabetes mellitus   . Headache    "2 a month"  . Hypertension   . Neuropathy   . Pneumonia    Past Surgical History:  Procedure Laterality Date  . ABDOMINAL HYSTERECTOMY    . BREAST EXCISIONAL BIOPSY  2001   LEFT BREAST  . CARPAL TUNNEL RELEASE    . KNEE ARTHROSCOPY Right   . KNEE SURGERY Left   . NECK SURGERY    . SHOULDER ARTHROSCOPY    . SHOULDER ARTHROSCOPY WITH SUBACROMIAL DECOMPRESSION AND BICEP TENDON REPAIR Right 02/15/2016   Procedure: SHOULDER DIAGNOSTIC OPERATIVE ARTHROSCOPY WITH SUBACROMIAL DECOMPRESSION AND DISTAL CLAVICLE EXCISION;  Surgeon: Meredith Pel, MD;  Location: [facility];  Service: Orthopedics;  Laterality: Right;  . TONSILLECTOMY    . ulner nerve     . V TACH ABLATION N/A 09/27/2016   Procedure: V Tach Ablation;  Surgeon: [name], MD;  Location: [facility];  Service: Cardiovascular;  Laterality: N/A;    ROS- all systems are reviewed and negatives except as per HPI above  Current Outpatient Prescriptions  Medication Sig Dispense Refill  . albuterol (PROVENTIL HFA;VENTOLIN HFA) 108 (90 BASE) MCG/ACT inhaler Inhale 2 puffs into the lungs every 6 (six) hours as needed for wheezing or shortness of breath. 1 Inhaler 2  . benazepril (LOTENSIN) 20 MG tablet Take 1 tablet (20 mg total) by mouth daily. (Patient taking differently: Take 10 mg by mouth  daily. ) 90 tablet 3  . diclofenac (VOLTAREN) 75 MG EC tablet Take 1 tablet (75 mg total) by mouth 2 (two) times daily. 60 tablet 1  . gabapentin (NEURONTIN) 300 MG capsule TAKE 3 CAPSULES BY MOUTH 3 TIMES A DAY 270 capsule 0  . metFORMIN (GLUCOPHAGE) 1000 MG tablet TAKE 1 TABLET BY MOUTH 2 TIMES DAILY WITH A MEAL. 60 tablet 3  . Multiple Vitamins-Minerals (WOMENS 50+ MULTI VITAMIN/MIN PO) Take 1 tablet by mouth daily.    . rizatriptan (MAXALT-MLT) 10 MG disintegrating tablet Take 1 tablet (10 mg total) by mouth as needed for migraine. May repeat in 2 hours if needed 9 tablet 11  . topiramate (TOPAMAX) 50 MG tablet Take 1 tablet (50 mg total) by mouth 2 (two) times daily. 60 tablet 12  . traZODone (DESYREL) 150 MG tablet Take 2 tablets (300 mg total) by mouth at bedtime. 180 tablet 5   Current Facility-Administered Medications  Medication Dose Route Frequency Provider Last Rate Last Dose  . betamethasone acetate-betamethasone sodium phosphate (CELESTONE) injection 3 mg  3 mg Intramuscular Once Daylene Katayama M, DPM      . betamethasone acetate-betamethasone sodium phosphate (CELESTONE) injection 3 mg  3 mg Intramuscular Once Daylene Katayama M, DPM      . betamethasone acetate-betamethasone sodium phosphate (CELESTONE) injection 3 mg  3 mg Intramuscular Once Edrick Kins, DPM        Physical Exam: Vitals:  01/09/17 0855  BP: 138/80  Pulse: 70  SpO2: 98%  Weight: 191 lb 12.8 oz (87 kg)  Height: 5' 6.5" (1.689 m)    GEN- The patient is well appearing, alert and oriented x 3 today.   Head- normocephalic, atraumatic Eyes-  Sclera clear, conjunctiva pink Ears- hearing intact Oropharynx- clear Lungs- Clear to ausculation bilaterally, normal work of breathing Heart- Regular rate and rhythm with frequent ectopy, no murmurs, rubs or gallops, PMI not laterally displaced GI- soft, NT, ND, + BS Extremities- no clubbing, cyanosis, or edema  EKG from 6/18 is personally reviewed and shows sinus  rhythm with PVCs in begeminy  Assessment and Plan:  1. PVCs Recurrent PVCs post ablation Therapeutic strategies for PVCs including medicine and ablation were discussed in detail with the patient today.  At this time, she would prefer medical therapy over repeat ablation. Risks of flecainide were discussed with her today.  I will therefore start flecainide 50mg  BID today.  Also nadolol 10mg  daily Return in 3 days for repeat ekg.  Follow-up with me in 4-6 weeks.  2. HTN Stable No change required today  [name] MD, [facility] 01/09/2017 9:22 AM

## 2017-01-12 ENCOUNTER — Ambulatory Visit (INDEPENDENT_AMBULATORY_CARE_PROVIDER_SITE_OTHER): Payer: Medicaid Other | Admitting: *Deleted

## 2017-01-12 VITALS — HR 63 | Resp 20 | Ht 66.0 in | Wt 194.8 lb

## 2017-01-12 DIAGNOSIS — I493 Ventricular premature depolarization: Secondary | ICD-10-CM | POA: Diagnosis not present

## 2017-01-16 ENCOUNTER — Ambulatory Visit: Payer: Medicaid Other | Admitting: Podiatry

## 2017-01-16 ENCOUNTER — Telehealth: Payer: Self-pay

## 2017-01-16 MED ORDER — FLECAINIDE ACETATE 100 MG PO TABS: 100.0000 mg | ORAL_TABLET | Freq: Two times a day (BID) | ORAL | 11 refills | Status: DC

## 2017-01-16 NOTE — Telephone Encounter (Signed)
Pt made aware of MD orders to increase dose of Flecanide to 100 mg by mouth TWICE daily. Pt RX updated and sent to preferred pharmacy. No additional questions at this time

## 2017-01-28 ENCOUNTER — Other Ambulatory Visit: Payer: Self-pay

## 2017-01-28 ENCOUNTER — Encounter (HOSPITAL_COMMUNITY): Payer: Self-pay

## 2017-01-28 ENCOUNTER — Emergency Department (HOSPITAL_BASED_OUTPATIENT_CLINIC_OR_DEPARTMENT_OTHER)
Admit: 2017-01-28 | Discharge: 2017-01-28 | Disposition: A | Discharge status: Home / Self Care | Payer: Medicaid Other | Attending: Emergency Medicine | Admitting: Emergency Medicine

## 2017-01-28 ENCOUNTER — Emergency Department (HOSPITAL_COMMUNITY): Payer: Medicaid Other

## 2017-01-28 ENCOUNTER — Emergency Department (HOSPITAL_COMMUNITY)
Admission: EM | Admit: 2017-01-28 | Discharge: 2017-01-28 | Disposition: A | Discharge status: Home / Self Care | Payer: Medicaid Other | Attending: Emergency Medicine | Admitting: Emergency Medicine

## 2017-01-28 DIAGNOSIS — M79609 Pain in unspecified limb: Secondary | ICD-10-CM

## 2017-01-28 DIAGNOSIS — Z7984 Long term (current) use of oral hypoglycemic drugs: Secondary | ICD-10-CM | POA: Diagnosis not present

## 2017-01-28 DIAGNOSIS — R51 Headache: Secondary | ICD-10-CM | POA: Diagnosis not present

## 2017-01-28 DIAGNOSIS — R197 Diarrhea, unspecified: Secondary | ICD-10-CM | POA: Insufficient documentation

## 2017-01-28 DIAGNOSIS — E119 Type 2 diabetes mellitus without complications: Secondary | ICD-10-CM | POA: Insufficient documentation

## 2017-01-28 DIAGNOSIS — R600 Localized edema: Secondary | ICD-10-CM | POA: Diagnosis not present

## 2017-01-28 DIAGNOSIS — R609 Edema, unspecified: Secondary | ICD-10-CM

## 2017-01-28 DIAGNOSIS — E860 Dehydration: Secondary | ICD-10-CM | POA: Diagnosis not present

## 2017-01-28 DIAGNOSIS — R519 Headache, unspecified: Secondary | ICD-10-CM

## 2017-01-28 DIAGNOSIS — I10 Essential (primary) hypertension: Secondary | ICD-10-CM | POA: Diagnosis not present

## 2017-01-28 DIAGNOSIS — Z79899 Other long term (current) drug therapy: Secondary | ICD-10-CM | POA: Diagnosis not present

## 2017-01-28 LAB — CBC WITH DIFFERENTIAL/PLATELET
BASOS PCT: 0 %
Basophils Absolute: 0 10*3/uL (ref 0.0–0.1)
EOS ABS: 0.1 10*3/uL (ref 0.0–0.7)
EOS PCT: 1 %
HCT: 36.1 % (ref 36.0–46.0)
Hemoglobin: 11.8 g/dL — ABNORMAL LOW (ref 12.0–15.0)
Lymphocytes Relative: 35 %
Lymphs Abs: 2 10*3/uL (ref 0.7–4.0)
MCH: 33 pg (ref 26.0–34.0)
MCHC: 32.7 g/dL (ref 30.0–36.0)
MCV: 100.8 fL — ABNORMAL HIGH (ref 78.0–100.0)
MONO ABS: 0.3 10*3/uL (ref 0.1–1.0)
MONOS PCT: 6 %
Neutro Abs: 3.2 10*3/uL (ref 1.7–7.7)
Neutrophils Relative %: 57 %
Platelets: 103 10*3/uL — ABNORMAL LOW (ref 150–400)
RBC: 3.58 MIL/uL — ABNORMAL LOW (ref 3.87–5.11)
RDW: 14.6 % (ref 11.5–15.5)
WBC: 5.6 10*3/uL (ref 4.0–10.5)

## 2017-01-28 LAB — URINALYSIS, ROUTINE W REFLEX MICROSCOPIC
BILIRUBIN URINE: NEGATIVE
Glucose, UA: 50 mg/dL — AB
HGB URINE DIPSTICK: NEGATIVE
KETONES UR: NEGATIVE mg/dL
Leukocytes, UA: NEGATIVE
NITRITE: NEGATIVE
Protein, ur: NEGATIVE mg/dL
Specific Gravity, Urine: 1.013 (ref 1.005–1.030)
pH: 6 (ref 5.0–8.0)

## 2017-01-28 LAB — COMPREHENSIVE METABOLIC PANEL
ALK PHOS: 69 U/L (ref 38–126)
ALT: 30 U/L (ref 14–54)
ANION GAP: 8 (ref 5–15)
AST: 25 U/L (ref 15–41)
Albumin: 3.2 g/dL — ABNORMAL LOW (ref 3.5–5.0)
BILIRUBIN TOTAL: 0.6 mg/dL (ref 0.3–1.2)
BUN: 24 mg/dL — AB (ref 6–20)
CALCIUM: 8.7 mg/dL — AB (ref 8.9–10.3)
CO2: 24 mmol/L (ref 22–32)
CREATININE: 1.1 mg/dL — AB (ref 0.44–1.00)
Chloride: 107 mmol/L (ref 101–111)
GFR calc non Af Amer: 55 mL/min — ABNORMAL LOW (ref >60)
Glucose, Bld: 93 mg/dL (ref 65–99)
POTASSIUM: 3.7 mmol/L (ref 3.5–5.1)
Sodium: 139 mmol/L (ref 135–145)
Total Protein: 5.3 g/dL — ABNORMAL LOW (ref 6.5–8.1)

## 2017-01-28 MED ORDER — SODIUM CHLORIDE 0.9 % IV BOLUS (SEPSIS)
1000.0000 mL | Freq: Once | INTRAVENOUS | Status: AC
  Administered 2017-01-28: 1000 mL via INTRAVENOUS

## 2017-01-28 MED ORDER — METOCLOPRAMIDE HCL 5 MG/ML IJ SOLN
10.0000 mg | Freq: Once | INTRAMUSCULAR | Status: AC
  Administered 2017-01-28: 10 mg via INTRAVENOUS
  Filled 2017-01-28: qty 2

## 2017-01-28 MED ORDER — DIPHENHYDRAMINE HCL 50 MG/ML IJ SOLN
25.0000 mg | Freq: Once | INTRAMUSCULAR | Status: AC
  Administered 2017-01-28: 25 mg via INTRAVENOUS
  Filled 2017-01-28: qty 1

## 2017-01-28 MED ORDER — KETOROLAC TROMETHAMINE 30 MG/ML IJ SOLN
15.0000 mg | Freq: Once | INTRAMUSCULAR | Status: AC
  Administered 2017-01-28: 15 mg via INTRAVENOUS
  Filled 2017-01-28: qty 1

## 2017-01-28 NOTE — ED Provider Notes (Signed)
[facility] DEPT Provider Note   CSN: [id number] Arrival date & time: 01/28/17  1221     History   Chief Complaint Chief Complaint  Patient presents with  . Headache  . Diarrhea  . Leg Pain    HPI Briana Alvarez is a 56 y.o. female.  Briana Alvarez is a 56 y.o. Female with a history of headaches and diabetes who presents to the ED complaining of a headache for three days, diarrhea for 2.5 weeks, and left leg swelling for 2 weeks. Reports her main complaint is her posterior headache. She reports seems to be better lying down and worse when she stands up. She reports she's been having some shoulder pain and went to the chiropractor about 2 weeks ago where they hammered on her shoulders and neck. She reports gradual onset of headache three days ago that is posterior. She has taken Topamax and Maxalt without relief. She reports she's had several episodes of vomiting over the past several days. She reports vomiting once this morning around 2 AM. No abdominal pain. She reports 3 or more episodes of diarrhea every day over the past 2-1/2 weeks. She reports she was started on flecainide recently for PVCs about 2 weeks ago. She denies feeling lightheaded with position change. She denies chest pain or shortness of breath. She also reports left leg swelling ongoing for the past 2 weeks. She reports her legs when is worse after ambulating and is better with propping it up. She denies any leg pain. She denies fevers, changes to her vision, double vision, neck stiffness, chest pain, shortness of breath, lightheadedness, dizziness, syncope, anticoagulation use, head injury, numbness, tingling, weakness, leg pain, abdominal pain, hematemesis or hematochezia. No recent antibiotic use.   The history is provided by the patient and medical records. No language interpreter was used.    Past Medical History:  Diagnosis Date  . Anxiety   . Arthritis   . Depression   . Diabetes mellitus   . Headache    "2 a  month"  . Hypertension   . Neuropathy   . Pneumonia     Patient Active Problem List   Diagnosis Date Noted  . PVC's (premature ventricular contractions) 09/27/2016  . 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 Chest pain 06/28/2016  . Daytime hypersomnia 01/11/2016  . Morbid obesity ([place]) 08/27/2015  . Left low back pain 08/27/2015  . Diabetic peripheral neuropathy associated with type 2 diabetes mellitus ([place]) 04/10/2015  . Lumbar radiculopathy, chronic 02/18/2014  . Hypertriglyceridemia 01/08/2014  . Diabetes type 2, controlled ([place]) 07/12/2013  . Insomnia 07/12/2013  . Depression 07/12/2013  . Essential hypertension, benign 07/12/2013  . Peripheral nerve disease 01/16/2013  . Ankle sprain 11/30/2012  . Bipolar affective disorder ([place]) 10/12/2012  . BP (high blood pressure) 10/12/2012  . Diabetes mellitus ([place]) 10/12/2012    Past Surgical History:  Procedure Laterality Date  . ABDOMINAL HYSTERECTOMY    . BREAST EXCISIONAL BIOPSY  2001   LEFT BREAST  . CARPAL TUNNEL RELEASE    . KNEE ARTHROSCOPY Right   . KNEE SURGERY Left   . NECK SURGERY    . SHOULDER ARTHROSCOPY    . SHOULDER ARTHROSCOPY WITH SUBACROMIAL DECOMPRESSION AND BICEP TENDON REPAIR Right 02/15/2016   Procedure: SHOULDER DIAGNOSTIC OPERATIVE ARTHROSCOPY WITH SUBACROMIAL DECOMPRESSION AND DISTAL CLAVICLE EXCISION;  Surgeon: Meredith Pel, MD;  Location: [facility];  Service: Orthopedics;  Laterality: Right;  . TONSILLECTOMY    . ulner nerve     . V TACH  ABLATION N/A 09/27/2016   Procedure: Stephanie Coup Ablation;  Surgeon: [name], MD;  Location: [facility];  Service: Cardiovascular;  Laterality: N/A;    OB History    No data available       Home Medications    Prior to Admission medications   Medication Sig Start Date End Date Taking? Authorizing Provider  acetaminophen (TYLENOL) 500 MG tablet Take 1,000 mg by mouth every 6 (six) hours as needed for headache (pain).   Yes [provider]    albuterol (PROVENTIL HFA;VENTOLIN HFA) 108 (90 BASE) MCG/ACT inhaler Inhale 2 puffs into the lungs every 6 (six) hours as needed for wheezing or shortness of breath. 05/09/14  Yes Gregor Hams, MD  Aspirin-Caffeine (BAYER BACK & BODY) 500-32.5 MG TABS Take 2 tablets by mouth every 6 (six) hours as needed (pain/headache).   Yes [provider]  benazepril (LOTENSIN) 20 MG tablet Take 1 tablet (20 mg total) by mouth daily. 07/28/16  Yes Jettie Booze, MD  diclofenac (VOLTAREN) 75 MG EC tablet Take 1 tablet (75 mg total) by mouth 2 (two) times daily. 12/19/16  Yes Edrick Kins, DPM  flecainide (TAMBOCOR) 50 MG tablet Take 50 mg by mouth 2 (two) times daily.   Yes [provider]  gabapentin (NEURONTIN) 300 MG capsule TAKE 3 CAPSULES BY MOUTH 3 TIMES A DAY Patient taking differently: TAKE 3 CAPSULES (900 MG) BY MOUTH 3 TIMES A DAY 12/14/15  Yes Funches, Josalyn, MD  metFORMIN (GLUCOPHAGE) 1000 MG tablet TAKE 1 TABLET BY MOUTH 2 TIMES DAILY WITH A MEAL. 09/01/15  Yes Funches, Josalyn, MD  Multiple Vitamin (MULTIVITAMIN WITH MINERALS) TABS tablet Take 1 tablet by mouth daily. Women's One a Day 60 +   Yes [provider]  nadolol (CORGARD) 20 MG tablet Take 1/2 tablet (10 mg) by mouth once daliy Patient taking differently: Take 10 mg by mouth daily.  01/09/17  Yes Allred, Jeneen Rinks, MD  rizatriptan (MAXALT-MLT) 10 MG disintegrating tablet Take 1 tablet (10 mg total) by mouth as needed for migraine. May repeat in 2 hours if needed Patient taking differently: Take 10 mg by mouth See admin instructions. Dissolve 1 tablet (10 mg) under the tongue as needed for migraine -may repeat in 2 hours if needed 10/16/15  Yes Penumalli, Earlean Polka, MD  topiramate (TOPAMAX) 50 MG tablet Take 1 tablet (50 mg total) by mouth 2 (two) times daily. Patient taking differently: Take 50 mg by mouth 2 (two) times daily as needed (migraines).  10/16/15  Yes Penumalli, Earlean Polka, MD  traZODone (DESYREL) 150 MG  tablet Take 2 tablets (300 mg total) by mouth at bedtime. Patient taking differently: Take 150 mg by mouth at bedtime as needed for sleep.  11/09/15  Yes Funches, Josalyn, MD  flecainide (TAMBOCOR) 100 MG tablet Take 1 tablet (100 mg total) by mouth 2 (two) times daily. Patient not taking: Reported on 01/28/2017 01/16/17   [name], MD    Family History Family History  Problem Relation Age of Onset  . Cancer Mother   . Hypertension Mother   . Diabetes Daughter   . Diabetes Maternal Grandmother   . Hypertension Maternal Grandmother   . Heart attack Neg Hx   . Stroke Neg Hx     Social History Social History  Substance Use Topics  . Smoking status: Never Smoker  . Smokeless tobacco: Never Used  . Alcohol use No     Allergies   Shrimp [shellfish allergy]  Review of Systems Review of Systems  Constitutional: Negative for chills and fever.  HENT: Negative for congestion and sore throat.   Eyes: Negative for visual disturbance.  Respiratory: Negative for cough, shortness of breath and wheezing.   Cardiovascular: Positive for leg swelling. Negative for chest pain and palpitations.  Gastrointestinal: Positive for diarrhea, nausea and vomiting. Negative for abdominal pain and blood in stool.  Genitourinary: Negative for difficulty urinating, dysuria, flank pain, frequency, hematuria and urgency.  Musculoskeletal: Negative for back pain, gait problem, neck pain and neck stiffness.  Skin: Negative for rash and wound.  Neurological: Positive for headaches. Negative for dizziness, syncope, weakness, light-headedness and numbness.     Physical Exam Updated Vital Signs BP (!) 122/56   Pulse (!) 44   Temp 98.2 F (36.8 C) (Oral)   Resp 14   LMP  (LMP Unknown)   SpO2 95%   Physical Exam  Constitutional: She is oriented to person, place, and time. She appears well-developed and well-nourished. No distress.  Nontoxic appearing.  HENT:  Head: Normocephalic and atraumatic.    Right Ear: External ear normal.  Left Ear: External ear normal.  Mouth/Throat: Oropharynx is clear and moist.  No temporal edema or tenderness.  Eyes: Pupils are equal, round, and reactive to light. Conjunctivae and EOM are normal. Right eye exhibits no discharge. Left eye exhibits no discharge.  Neck: Normal range of motion. Neck supple. No JVD present. No tracheal deviation present.  No meningeal signs.  Cardiovascular: Regular rhythm, normal heart sounds and intact distal pulses.  Exam reveals no gallop and no friction rub.   No murmur heard. Heart rate between 42 and 50 on my exam. Bilateral radial, posterior tibialis and dorsalis pedis pulses are intact.    Pulmonary/Chest: Effort normal and breath sounds normal. No stridor. No respiratory distress. She has no wheezes. She has no rales.  Abdominal: Soft. Bowel sounds are normal. She exhibits no distension and no mass. There is no tenderness. There is no rebound and no guarding.  Abdomen is soft and nontender to palpation. Bowel sounds are present.  Musculoskeletal: Normal range of motion. She exhibits edema. She exhibits no tenderness or deformity.  Mild bilateral lower extremity edema with her left greater than her right. No calf tenderness bilaterally.  Lymphadenopathy:    She has no cervical adenopathy.  Neurological: She is alert and oriented to person, place, and time. No cranial nerve deficit or sensory deficit. She exhibits normal muscle tone. Coordination normal.  Patient is alert and oriented 3. Cranial nerves are intact. Speech is clear and coherent. No pronator drift. Finger to nose intact bilaterally. EOMs are intact. Strength and sensation is intact in her bilateral upper and lower extremities. Normal gait.   Skin: Skin is warm and dry. Capillary refill takes less than 2 seconds. No rash noted. She is not diaphoretic. No erythema. No pallor.  Psychiatric: She has a normal mood and affect. Her behavior is normal.  Nursing  note and vitals reviewed.    ED Treatments / Results  Labs (all labs ordered are listed, but only abnormal results are displayed) Labs Reviewed  COMPREHENSIVE METABOLIC PANEL - Abnormal; Notable for the following:       Result Value   BUN 24 (*)    Creatinine, Ser 1.10 (*)    Calcium 8.7 (*)    Total Protein 5.3 (*)    Albumin 3.2 (*)    GFR calc non Af Amer 55 (*)    All other components  within normal limits  CBC WITH DIFFERENTIAL/PLATELET - Abnormal; Notable for the following:    RBC 3.58 (*)    Hemoglobin 11.8 (*)    MCV 100.8 (*)    Platelets 103 (*)    All other components within normal limits  URINALYSIS, ROUTINE W REFLEX MICROSCOPIC - Abnormal; Notable for the following:    Glucose, UA 50 (*)    All other components within normal limits  GASTROINTESTINAL PANEL BY PCR, STOOL (REPLACES STOOL CULTURE)    EKG  EKG Interpretation ED ECG REPORT   Date: 01/28/2017  Rate: 52  Rhythm: normal sinus rhythm  QRS Axis: left (borderline)   Intervals: normal  ST/T Wave abnormalities: normal  Conduction Disutrbances:none  Narrative Interpretation:   Old EKG Reviewed: unchanged  I have personally reviewed the EKG tracing and agree with the computerized printout as noted.        Radiology Ct Head Wo Contrast  Result Date: 01/28/2017 CLINICAL DATA:  Headache EXAM: CT HEAD WITHOUT CONTRAST TECHNIQUE: Contiguous axial images were obtained from the base of the skull through the vertex without intravenous contrast. COMPARISON:  MRI head 10/24/2015 FINDINGS: Brain: No evidence of acute infarction, hemorrhage, hydrocephalus, extra-axial collection or mass lesion/mass effect. Vascular: No hyperdense vessel or unexpected calcification. Skull: Negative Sinuses/Orbits: Mucosal edema in the paranasal sinuses with large retention cysts bilaterally left greater than right. Negative orbit Other: None IMPRESSION: Negative CT head Sinus mucosal disease Electronically Signed   By: Franchot Gallo M.D.   On: 01/28/2017 16:48    Procedures Procedures (including critical care time)  Medications Ordered in ED Medications  sodium chloride 0.9 % bolus 1,000 mL (0 mLs Intravenous Stopped 01/28/17 1816)  metoCLOPramide (REGLAN) injection 10 mg (10 mg Intravenous Given 01/28/17 1624)  diphenhydrAMINE (BENADRYL) injection 25 mg (25 mg Intravenous Given 01/28/17 1624)  ketorolac (TORADOL) 30 MG/ML injection 15 mg (15 mg Intravenous Given 01/28/17 2212)     Initial Impression / Assessment and Plan / ED Course  I have reviewed the triage vital signs and the nursing notes.  Pertinent labs & imaging results that were available during my care of the patient were reviewed by me and considered in my medical decision making (see chart for details).     This  is a 56 y.o. Female with a history of headaches and diabetes who presents to the ED complaining of a headache for three days, diarrhea for 2.5 weeks, and left leg swelling for 2 weeks. Reports her main complaint is her posterior headache. She reports seems to be better lying down and worse when she stands up. She reports she's been having some shoulder pain and went to the chiropractor about 2 weeks ago where they hammered on her shoulders and neck. She reports gradual onset of headache three days ago that is posterior. She has taken Topamax and Maxalt without relief. She reports she's had several episodes of vomiting over the past several days. She reports vomiting once this morning around 2 AM. No abdominal pain. She reports 3 or more episodes of diarrhea every day over the past 2-1/2 weeks. She reports she was started on flecainide recently for PVCs about 2 weeks ago. She denies feeling lightheaded with position change. She denies chest pain or shortness of breath. She also reports left leg swelling ongoing for the past 2 weeks. She reports her legs when is worse after ambulating and is better with propping it up. She denies any leg pain. She  denies feeling lightheaded or dizzy.  She denies chest pain or shortness of breath. No palpitations. Patient is afebrile nontoxic appearing. She does have some bradycardia on exam with a heart rate between the 40s and 50s on my exam. She denies feeling lightheaded or dizzy or having any chest pain or shortness of breath. EKG without evidence of STEMI. She has no focal neurological deficits on exam. Normal gait. She is not orthostatic. She does have some mild bilateral lower extremity edema that is worse on her left. No tenderness to her lower extremities. UA is without sign of infection. CMP is on her baseline. CBC shows no leukocytosis, and is around her baseline. Patient had no diarrhea while in the emergency department. CT head without acute findings. Previously noted sinus mucosal disease. I encouraged her to use Flonase and Zyrtec. Patient agrees. Workup here is reassuring. We'll have her follow close with cardiology and with primary care. Return precautions discussed. I advised the patient to follow-up with their primary care provider this week. I advised the patient to return to the emergency department with new or worsening symptoms or new concerns. The patient verbalized understanding and agreement with plan.    This patient was discussed with Dr. Lita Mains who agrees with assessment and plan.   Final Clinical Impressions(s) / ED Diagnoses   Final diagnoses:  Bad headache  Peripheral edema  Diarrhea, unspecified type  Dehydration    New Prescriptions Discharge Medication List as of 01/28/2017 10:22 PM       Waynetta Pean, PA-C 01/29/17 0125    Julianne Rice, MD 01/29/17 1946

## 2017-01-28 NOTE — ED Notes (Signed)
Patient stated she is ready to go home, Will PA-C made aware

## 2017-01-28 NOTE — Progress Notes (Signed)
VASCULAR LAB PRELIMINARY  PRELIMINARY  PRELIMINARY  PRELIMINARY  Left lower extremity venous duplex completed.    Preliminary report:  There is no DVT or SVT noted in the left lower extremity.    Montavious Wierzba, RVT 01/28/2017, 5:35 PM

## 2017-01-28 NOTE — ED Notes (Signed)
Vascular Tech at bedside.  

## 2017-01-28 NOTE — ED Notes (Signed)
Patient Alert and oriented X4. Stable and ambulatory. Patient verbalized understanding of the discharge instructions.  Patient belongings were taken by the patient.  

## 2017-01-28 NOTE — ED Notes (Signed)
Urine contaminated with stool, will attempt to collect again later

## 2017-01-28 NOTE — ED Notes (Signed)
Provider at bedside

## 2017-01-28 NOTE — ED Notes (Signed)
Patient ambulated with steady gait to the restroom.  Denies any dizziness or light headedness.

## 2017-01-28 NOTE — ED Triage Notes (Signed)
Onset 01-24-17 headache.  Pt tried Topamax, Maxalt with no relief.  Onset yesterday vomiting, last vomit 2am. Pt reports headache improves with laying down, worse sitting up.

## 2017-01-29 LAB — GASTROINTESTINAL PANEL BY PCR, STOOL (REPLACES STOOL CULTURE)
ADENOVIRUS F40/41: NOT DETECTED
Astrovirus: NOT DETECTED
CRYPTOSPORIDIUM: NOT DETECTED
CYCLOSPORA CAYETANENSIS: NOT DETECTED
Campylobacter species: NOT DETECTED
ENTEROAGGREGATIVE E COLI (EAEC): NOT DETECTED
ENTEROPATHOGENIC E COLI (EPEC): NOT DETECTED
Entamoeba histolytica: NOT DETECTED
Enterotoxigenic E coli (ETEC): NOT DETECTED
GIARDIA LAMBLIA: NOT DETECTED
Norovirus GI/GII: NOT DETECTED
Plesimonas shigelloides: NOT DETECTED
ROTAVIRUS A: NOT DETECTED
Salmonella species: NOT DETECTED
Sapovirus (I, II, IV, and V): NOT DETECTED
Shiga like toxin producing E coli (STEC): NOT DETECTED
Shigella/Enteroinvasive E coli (EIEC): NOT DETECTED
VIBRIO CHOLERAE: NOT DETECTED
VIBRIO SPECIES: NOT DETECTED
YERSINIA ENTEROCOLITICA: NOT DETECTED

## 2017-02-22 ENCOUNTER — Telehealth: Payer: Self-pay | Admitting: Podiatry

## 2017-02-22 ENCOUNTER — Ambulatory Visit: Payer: Self-pay | Admitting: Podiatry

## 2017-02-22 MED ORDER — DICLOFENAC SODIUM 75 MG PO TBEC: 75.0000 mg | DELAYED_RELEASE_TABLET | Freq: Two times a day (BID) | ORAL | 0 refills | Status: DC

## 2017-02-22 NOTE — Telephone Encounter (Signed)
Pt called back requesting a refill on the diclofenac. Said her pharmacy has tried contacting us since last Wednesday to get it refilled. Pt stated she is in pain and that she left a message earlier today.

## 2017-02-22 NOTE — Telephone Encounter (Signed)
Dr. Amalia Hailey ordered refill #30 diclofenac, and have pt make an follow up appt. Left message informing pt of Dr. Amalia Hailey orders.

## 2017-02-22 NOTE — Addendum Note (Signed)
Addended by: Harriett Sine D on: 02/22/2017 05:13 PM   Modules accepted: Orders

## 2017-02-22 NOTE — Telephone Encounter (Signed)
I need a refill of the medication Dr. Amalia Hailey prescribed for my feet. The pharmacy has tried contacting your office for a refill but they have not received a response back. I don't have an appointment until Monday but my feet are really hurting me. You can call me back at [phone]. Thank you.

## 2017-02-27 ENCOUNTER — Ambulatory Visit (INDEPENDENT_AMBULATORY_CARE_PROVIDER_SITE_OTHER): Payer: Medicaid Other | Admitting: Podiatry

## 2017-02-27 ENCOUNTER — Encounter: Payer: Self-pay | Admitting: Podiatry

## 2017-02-27 DIAGNOSIS — G5751 Tarsal tunnel syndrome, right lower limb: Secondary | ICD-10-CM

## 2017-02-27 DIAGNOSIS — M21622 Bunionette of left foot: Secondary | ICD-10-CM

## 2017-02-27 DIAGNOSIS — M21621 Bunionette of right foot: Secondary | ICD-10-CM | POA: Diagnosis not present

## 2017-02-27 DIAGNOSIS — G5752 Tarsal tunnel syndrome, left lower limb: Secondary | ICD-10-CM

## 2017-02-27 MED ORDER — DICLOFENAC SODIUM 75 MG PO TBEC: 75.0000 mg | DELAYED_RELEASE_TABLET | Freq: Two times a day (BID) | ORAL | 1 refills | Status: DC

## 2017-02-27 NOTE — Progress Notes (Signed)
   HPI: 56 year old female presents today for follow-up treatment and evaluation of tarsal tunnel syndrome bilateral feet as well as a new complaint regarding painful tailor's bunion deformities to the bilateral feet. She states that the tailor's bunions hurt in all shoe gear with rubbing and irritation. She is developed symptomatic calluses sub-fifth MPJ. Patient states that she was unable to get an MRI to the bilateral ankles to address the nonprogressive tarsal tunnel syndrome. She would like to get an MRI of this time  Past Medical History:  Diagnosis Date  . Anxiety   . Arthritis   . Depression   . Diabetes mellitus   . Headache    "2 a month"  . Hypertension   . Neuropathy   . Pneumonia      Physical Exam: General: The patient is alert and oriented x3 in no acute distress.  Dermatology: Skin is warm, dry and supple bilateral lower extremities. Negative for open lesions or macerations.  Vascular: Palpable pedal pulses bilaterally. No edema or erythema noted. Capillary refill within normal limits.  Neurological: Epicritic and protective threshold grossly intact bilaterally.   Musculoskeletal Exam: Electrical pain sensations upon percussion of the tibial nerve bilateral lower extremities consistent with tarsal tunnel syndrome with positive Tinel sign. Range of motion within normal limits to all pedal and ankle joints bilateral. Muscle strength 5/5 in all groups bilateral.   Radiographic Exam:  Normal osseous mineralization. Joint spaces preserved. No fracture/dislocation/boney destruction.    Assessment: -  Tarsal tunnel syndrome bilateral -Status post EPF surgery bilateral. Date of surgery 03/10/2016   Plan of Care:  - Patient was evaluated today - Today we will follow-up with the MRI to the bilateral ankles to visualize any tarsal tunnel syndrome impingement - Refill prescription for diclofenac 75 mg -  Excisional debridement of the callus lesions was performed to the  bilateral fifth MPJs using a chisel blade without incident or bleeding - After follow-up of the MRI we will address the tailor's bunion deformities and discussed conservative versus surgical management. - Return to clinic after MRI to review results  Edrick Kins, DPM [facility]  Dr. Edrick Kins, DPM    [address], [place] [address]                Office [phone]  Fax [phone]

## 2017-02-28 ENCOUNTER — Telehealth: Payer: Self-pay | Admitting: *Deleted

## 2017-02-28 DIAGNOSIS — G8929 Other chronic pain: Secondary | ICD-10-CM

## 2017-02-28 DIAGNOSIS — M25571 Pain in right ankle and joints of right foot: Secondary | ICD-10-CM

## 2017-02-28 DIAGNOSIS — G5752 Tarsal tunnel syndrome, left lower limb: Secondary | ICD-10-CM

## 2017-02-28 DIAGNOSIS — M21622 Bunionette of left foot: Secondary | ICD-10-CM

## 2017-02-28 DIAGNOSIS — M21621 Bunionette of right foot: Secondary | ICD-10-CM

## 2017-02-28 DIAGNOSIS — G5751 Tarsal tunnel syndrome, right lower limb: Secondary | ICD-10-CM

## 2017-02-28 NOTE — Telephone Encounter (Addendum)
-----   Message from Edrick Kins, DPM sent at 02/27/2017  5:40 PM EDT ----- Regarding: MRI b/l ankles Patient was unable to get the MRI a few months ago which we arranged. She wants to get it done now, can you follow up and make sure she still has authorization.  She could not remember the [place] that originally contacted her.  Thanks, Dr. Amalia Hailey. 03/01/2017-Rolando R. - [facility] states there has been too long of a wait 01/19/2017 cut off and need to start a new case. [place] states she has closed the MRIs for right and left ankles, states Right and Left MRI of ankles will be okayed for without contrast AUTHORIZATION# [id number], VALID 03/01/2017 - 03/21/2017. Faxed MRI Right and Left Ankle to [facility] and informed pt they had been authorized and would not be extended again. Pt states understanding and I gave her [phone] for appt.

## 2017-03-01 NOTE — Telephone Encounter (Deleted)
-----   Message from Edrick Kins, DPM sent at 02/27/2017  5:40 PM EDT ----- Regarding: MRI b/l ankles Patient was unable to get the MRI a few months ago which we arranged. She wants to get it done now, can you follow up and make sure she still has authorization.  She could not remember the [place] that originally contacted her.  Thanks, Dr. Amalia Hailey

## 2017-03-06 ENCOUNTER — Encounter: Payer: Self-pay | Admitting: Internal Medicine

## 2017-03-06 ENCOUNTER — Ambulatory Visit (INDEPENDENT_AMBULATORY_CARE_PROVIDER_SITE_OTHER): Payer: Medicaid Other | Admitting: Internal Medicine

## 2017-03-06 VITALS — BP 138/82 | HR 77 | Ht 66.5 in | Wt 185.2 lb

## 2017-03-06 DIAGNOSIS — I498 Other specified cardiac arrhythmias: Secondary | ICD-10-CM

## 2017-03-06 DIAGNOSIS — I10 Essential (primary) hypertension: Secondary | ICD-10-CM | POA: Diagnosis not present

## 2017-03-06 DIAGNOSIS — I499 Cardiac arrhythmia, unspecified: Secondary | ICD-10-CM | POA: Diagnosis not present

## 2017-03-06 NOTE — Progress Notes (Signed)
PCP: Vicenta Aly, FNP Primary Cardiologist: Dr Irish Lack Primary EP: Dr Rayann Heman  Briana Alvarez is a 56 y.o. female who presents today for routine electrophysiology followup.  Since last being seen in our clinic, the patient reports doing reasonably well.  She stopped flecainide and nadolol due to edema.  Edema has since improved.  Continues to have PVCs.  Today, she denies symptoms of palpitations, chest pain, shortness of breath,  lower extremity edema, dizziness, presyncope, or syncope.  The patient is otherwise without complaint today.   Past Medical History:  Diagnosis Date  . Anxiety   . Arthritis   . Depression   . Diabetes mellitus   . Headache    "2 a month"  . Hypertension   . Neuropathy   . Pneumonia    Past Surgical History:  Procedure Laterality Date  . ABDOMINAL HYSTERECTOMY    . BREAST EXCISIONAL BIOPSY  2001   LEFT BREAST  . CARPAL TUNNEL RELEASE    . KNEE ARTHROSCOPY Right   . KNEE SURGERY Left   . NECK SURGERY    . SHOULDER ARTHROSCOPY    . SHOULDER ARTHROSCOPY WITH SUBACROMIAL DECOMPRESSION AND BICEP TENDON REPAIR Right 02/15/2016   Procedure: SHOULDER DIAGNOSTIC OPERATIVE ARTHROSCOPY WITH SUBACROMIAL DECOMPRESSION AND DISTAL CLAVICLE EXCISION;  Surgeon: Meredith Pel, MD;  Location: [facility];  Service: Orthopedics;  Laterality: Right;  . TONSILLECTOMY    . ulner nerve     . V TACH ABLATION N/A 09/27/2016   Procedure: V Tach Ablation;  Surgeon: [name], MD;  Location: [facility] [facility];  Service: Cardiovascular;  Laterality: N/A;    ROS- all systems are reviewed and negatives except as per HPI above  Current Outpatient Prescriptions  Medication Sig Dispense Refill  . acetaminophen (TYLENOL) 500 MG tablet Take 1,000 mg by mouth every 6 (six) hours as needed for headache (pain).    Marland Kitchen albuterol (PROVENTIL HFA;VENTOLIN HFA) 108 (90 BASE) MCG/ACT inhaler Inhale 2 puffs into the lungs every 6 (six) hours as needed for wheezing or shortness of breath. 1  Inhaler 2  . benazepril (LOTENSIN) 20 MG tablet Take 1 tablet (20 mg total) by mouth daily. 90 tablet 3  . diclofenac (VOLTAREN) 75 MG EC tablet Take 1 tablet (75 mg total) by mouth 2 (two) times daily. 90 tablet 1  . gabapentin (NEURONTIN) 300 MG capsule TAKE 3 CAPSULES BY MOUTH 3 TIMES A DAY 270 capsule 0  . metFORMIN (GLUCOPHAGE) 1000 MG tablet TAKE 1 TABLET BY MOUTH 2 TIMES DAILY WITH A MEAL. 60 tablet 3  . Multiple Vitamin (MULTIVITAMIN WITH MINERALS) TABS tablet Take 1 tablet by mouth daily. Women's One a Day 50 +    . rizatriptan (MAXALT-MLT) 10 MG disintegrating tablet Take 1 tablet (10 mg total) by mouth as needed for migraine. May repeat in 2 hours if needed 9 tablet 11  . topiramate (TOPAMAX) 50 MG tablet Take 1 tablet (50 mg total) by mouth 2 (two) times daily. 60 tablet 12  . traZODone (DESYREL) 150 MG tablet Take 2 tablets (300 mg total) by mouth at bedtime. 180 tablet 5   Current Facility-Administered Medications  Medication Dose Route Frequency Provider Last Rate Last Dose  . betamethasone acetate-betamethasone sodium phosphate (CELESTONE) injection 3 mg  3 mg Intramuscular Once Daylene Katayama M, DPM      . betamethasone acetate-betamethasone sodium phosphate (CELESTONE) injection 3 mg  3 mg Intramuscular Once Daylene Katayama M, DPM      . betamethasone acetate-betamethasone sodium  phosphate (CELESTONE) injection 3 mg  3 mg Intramuscular Once Edrick Kins, DPM        Physical Exam: Vitals:   03/06/17 1052  BP: 138/82  Pulse: 77  SpO2: 98%  Weight: 185 lb 3.2 oz (84 kg)  Height: 5' 6.5" (1.689 m)    GEN- The patient is well appearing, alert and oriented x 3 today.   Head- normocephalic, atraumatic Eyes-  Sclera clear, conjunctiva pink Ears- hearing intact Oropharynx- clear Lungs- Clear to ausculation bilaterally, normal work of breathing Heart- Regular rate and rhythm with frequent ectopy, no murmurs, rubs or gallops, PMI not laterally displaced GI- soft, NT, ND, +  BS Extremities- no clubbing, cyanosis, or edema  EKG tracing ordered today is personally reviewed and shows sinus rhythm with PVCs in bigeminy  Assessment and Plan:  1. PVCs Did not tolerate nadolol/ flecainide Therapeutic strategies for PVCs including medicine and repeat ablation were discussed in detail with the patient today. Risk, benefits, and alternatives to EP study and radiofrequency ablation were also discussed in detail today. She is inclined to repeat ablation.  She starts a new job October first and cannot miss work for 10 weeks.  She will therefore continue current therapy and see me for further discussion of ablation in 3 months  2. HTN Stable No change required today   [name] MD, [facility] 03/06/2017 11:04 AM

## 2017-03-06 NOTE — Patient Instructions (Signed)
Medication Instructions:  Your physician recommends that you continue on your current medications as directed. Please refer to the Current Medication list given to you today.  Labwork: None ordered.  Testing/Procedures: None ordered.  Follow-Up: Your physician wants you to follow-up in: 3 months with Dr. Allred.      Any Other Special Instructions Will Be Listed Below (If Applicable).  If you need a refill on your cardiac medications before your next appointment, please call your pharmacy.   

## 2017-03-08 ENCOUNTER — Ambulatory Visit (INDEPENDENT_AMBULATORY_CARE_PROVIDER_SITE_OTHER): Payer: Medicaid Other | Admitting: Orthopedic Surgery

## 2017-03-08 ENCOUNTER — Encounter (INDEPENDENT_AMBULATORY_CARE_PROVIDER_SITE_OTHER): Payer: Self-pay | Admitting: Orthopedic Surgery

## 2017-03-08 ENCOUNTER — Ambulatory Visit (INDEPENDENT_AMBULATORY_CARE_PROVIDER_SITE_OTHER): Payer: Medicaid Other

## 2017-03-08 DIAGNOSIS — M25552 Pain in left hip: Secondary | ICD-10-CM | POA: Diagnosis not present

## 2017-03-08 DIAGNOSIS — M542 Cervicalgia: Secondary | ICD-10-CM | POA: Diagnosis not present

## 2017-03-08 MED ORDER — METHYLPREDNISOLONE 4 MG PO TABS: ORAL_TABLET | ORAL | 0 refills | Status: DC

## 2017-03-08 MED ORDER — BACLOFEN 10 MG PO TABS: 10.0000 mg | ORAL_TABLET | Freq: Three times a day (TID) | ORAL | 0 refills | Status: DC

## 2017-03-09 NOTE — Progress Notes (Signed)
Office Visit Note   Patient: Briana Alvarez           Date of Birth: Dec 28, 1960           MRN: [id number] Visit Date: 03/08/2017 Requested by: Vicenta Aly, [address] [place], [place] [address] PCP: Vicenta Aly, FNP  Subjective: Chief Complaint  Patient presents with  . Neck - Pain  . Left Hip - Pain    HPI: Briana Alvarez is a 56 year old patient with neck pain and left hip pain.  She describes neck pain and tightness but no radicular pain.  She had previous cervical spine fusion in [facility] October 2013.  She's had 2 months of chiropractic care without help.  She has taken no medication for the problem.  She denies any arm symptoms.  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  She had right shoulder surgery in 2017 and is doing well with that.  Patient also describes left hip pain.  Unable to lay on the left-hand side.  She reports some weakness in hip flexion and occasional groin pain.  She feels like the leg will give way.  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              ROS: All systems reviewed are negative as they relate to the chief complaint within the history of present illness.  Patient denies  fevers or chills.   Assessment & Plan: Visit Diagnoses:  1. Neck pain   2. 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  No radicular symptoms are present.  No red flag symptoms of fevers or chills.  Plan is Medrol Dosepak plus baclofen or Robaxin as a muscle relaxer as initial treatment to treat this neck problem.  In regards to the hip I think she may have some occult arthritis.  No real trochanteric bursa symptoms today.  I do send her to Dr. Ernestina Patches for left hip injection radiographs negative for obvious arthritis  Follow-Up Instructions: Return if symptoms worsen or fail  to improve.   Orders:  Orders Placed This Encounter  Procedures  . XR Cervical Spine 2 or 3 views  . XR HIP UNILAT W OR W/O PELVIS 2-3 VIEWS LEFT  . Ambulatory referral to Physical Medicine Rehab   Meds ordered this encounter  Medications  . baclofen (LIORESAL) 10 MG tablet    Sig: Take 1 tablet (10 mg total) by mouth 3 (three) times daily.    Dispense:  30 each    Refill:  0  . methylPREDNISolone (MEDROL) 4 MG tablet    Sig: Take dosepak as directed    Dispense:  21 tablet    Refill:  0      Procedures: No procedures performed   Clinical Data: No additional findings.  Objective: Vital Signs: LMP  (LMP Unknown)   Physical Exam:   Constitutional: Patient appears well-developed HEENT:  Head: Normocephalic Eyes:EOM are normal Neck: Normal range of motion Cardiovascular: Normal rate Pulmonary/chest: Effort normal Neurologic: Patient is alert Skin: Skin is warm Psychiatric: Patient has normal mood and affect    Ortho Exam: Orthopedic exam demonstrates predictable loss of motion from her fusion.  She has about 45 of rotation to the left and right and about half of her  flexion-extension arc.  Motor sensory function to the hand is intact with symmetric reflexes biceps triceps on with palpable radial pulses.  No other masses lymph adenopathy or skin changes noted in the neck or shoulder region.  No paresthesias C5-T1.  Examination of the hips demonstrates some groin pain with internal/external rotation on the left but no real restriction of range of motion.  Slight hip flexion weakness but no nerve root tension signs.  Palpable pedal pulses no paresthesias L1 S1 bilaterally no discrete trochanteric tenderness on the left or right hand side.  Specialty Comments:  No specialty comments available.  Imaging: Xr Hip Unilat W Or W/o Pelvis 2-3 Views Left  Result Date: 03/09/2017 AP pelvis lateral left hip reviewed.  Joint spaces maintained.  No significant arthritic changes  present.  Remainder bony pelvis normal.  Xr Cervical Spine 2 Or 3 Views  Result Date: 03/09/2017 AP lateral cervical spine reviewed.  Fusion from C4-C7 is present.  No evidence of loosening around the hardware.  There is adjacent segment disease at C3-4 as well as the facet joint posteriorly of C7 8.  Lordosis has been preserved.    PMFS History: Patient Active Problem List   Diagnosis Date Noted  . PVC's (premature ventricular contractions) 09/27/2016  . 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 Chest pain 06/28/2016  . Daytime hypersomnia 01/11/2016  . Morbid obesity ([place]) 08/27/2015  . Left low back pain 08/27/2015  . Diabetic peripheral neuropathy associated with type 2 diabetes mellitus ([place]) 04/10/2015  . Lumbar radiculopathy, chronic 02/18/2014  . Hypertriglyceridemia 01/08/2014  . Diabetes type 2, controlled ([place]) 07/12/2013  . Insomnia 07/12/2013  . Depression 07/12/2013  . Essential hypertension, benign 07/12/2013  . Peripheral nerve disease 01/16/2013  . Ankle sprain 11/30/2012  . Bipolar affective disorder ([place]) 10/12/2012  . BP (high blood pressure) 10/12/2012  . Diabetes mellitus ([place]) 10/12/2012   Past Medical History:  Diagnosis Date  . Anxiety   . Arthritis   . Depression   . Diabetes mellitus   . Headache    "2 a month"  . Hypertension   . Neuropathy   . Pneumonia     Family History  Problem Relation Age of Onset  . Cancer Mother   . Hypertension Mother   . Diabetes Daughter   . Diabetes Maternal Grandmother   . Hypertension Maternal Grandmother   . Heart attack Neg Hx   . Stroke Neg Hx     Past Surgical History:  Procedure Laterality Date  . ABDOMINAL HYSTERECTOMY    . BREAST EXCISIONAL BIOPSY  2001   LEFT BREAST  . CARPAL TUNNEL RELEASE    . KNEE ARTHROSCOPY Right   . KNEE SURGERY Left   . NECK SURGERY    . SHOULDER ARTHROSCOPY    . SHOULDER ARTHROSCOPY WITH SUBACROMIAL DECOMPRESSION AND BICEP TENDON REPAIR Right 02/15/2016   Procedure:  SHOULDER DIAGNOSTIC OPERATIVE ARTHROSCOPY WITH SUBACROMIAL DECOMPRESSION AND DISTAL CLAVICLE EXCISION;  Surgeon: Meredith Pel, MD;  Location: [facility];  Service: Orthopedics;  Laterality: Right;  . TONSILLECTOMY    . ulner nerve     . V TACH ABLATION N/A 09/27/2016   Procedure: V Tach Ablation;  Surgeon: [name], MD;  Location: [facility];  Service: Cardiovascular;  Laterality: N/A;   Social History   Occupational History  . Not on file.   Social History Main Topics  . Smoking status: Never Smoker  . Smokeless tobacco: Never Used  . Alcohol use No  . Drug  use: No  . Sexual activity: Not on file

## 2017-03-12 ENCOUNTER — Ambulatory Visit
Admission: RE | Admit: 2017-03-12 | Discharge: 2017-03-12 | Disposition: A | Discharge status: Home / Self Care | Payer: Medicaid Other | Source: Ambulatory Visit | Attending: Podiatry | Admitting: Podiatry

## 2017-03-17 ENCOUNTER — Ambulatory Visit (INDEPENDENT_AMBULATORY_CARE_PROVIDER_SITE_OTHER): Payer: Medicaid Other | Admitting: Physical Medicine and Rehabilitation

## 2017-03-27 ENCOUNTER — Ambulatory Visit (INDEPENDENT_AMBULATORY_CARE_PROVIDER_SITE_OTHER): Payer: Medicaid Other | Admitting: Podiatry

## 2017-03-27 ENCOUNTER — Encounter: Payer: Self-pay | Admitting: Podiatry

## 2017-03-27 DIAGNOSIS — G5750 Tarsal tunnel syndrome, unspecified lower limb: Secondary | ICD-10-CM

## 2017-03-27 DIAGNOSIS — M216X2 Other acquired deformities of left foot: Secondary | ICD-10-CM

## 2017-03-27 DIAGNOSIS — M722 Plantar fascial fibromatosis: Secondary | ICD-10-CM | POA: Diagnosis not present

## 2017-03-27 DIAGNOSIS — M21622 Bunionette of left foot: Secondary | ICD-10-CM

## 2017-03-27 DIAGNOSIS — M21621 Bunionette of right foot: Secondary | ICD-10-CM

## 2017-03-27 DIAGNOSIS — M216X1 Other acquired deformities of right foot: Secondary | ICD-10-CM

## 2017-03-27 MED ORDER — BETAMETHASONE SOD PHOS & ACET 6 (3-3) MG/ML IJ SUSP: 3.0000 mg | Freq: Once | INTRAMUSCULAR | Status: AC

## 2017-03-27 NOTE — Progress Notes (Signed)
   HPI: 56 year old female presents today for follow-up treatment and evaluation regarding toes tarsal tunnel syndrome to the bilateral feet. Patient states the pain is intolerable and she cannot sleep at night. Last visit on 02/27/2017 MRI was ordered. She presents today for reviewing of the MRI results as well as further treatment and evaluation. She also complains of a symptomatic plantar fibroma to the right foot that has been going on for several months now. She presents for further treatment and evaluation  Past Medical History:  Diagnosis Date  . Anxiety   . Arthritis   . Depression   . Diabetes mellitus   . Headache    "2 a month"  . Hypertension   . Neuropathy   . Pneumonia      Physical Exam: General: The patient is alert and oriented x3 in no acute distress.  Dermatology: Skin is warm, dry and supple bilateral lower extremities. Negative for open lesions or macerations.  Vascular: Palpable pedal pulses bilaterally. No edema or erythema noted. Capillary refill within normal limits.  Neurological: Epicritic and protective threshold grossly intact bilaterally. Electrical pain sensations upon percussion of the tibial nerve bilateral lower extremities consistent with tarsal tunnel syndrome with positive Tinel sign.  Musculoskeletal Exam: Range of motion within normal limits to all pedal and ankle joints bilateral. Muscle strength 5/5 in all groups bilateral. Clinical evidence of tailor's bunion deformity to the bilateral feet with a prominent fifth metatarsal head and lateral deviation. There is pain on palpation and range of motion also noted to the fifth MPJ There is also a palpable mass noted to the plantar arch of the right foot consistent with a plantar fibroma  Assessment: -  Tarsal tunnel syndrome bilateral - Plantar fibroma right - Tailor's bunion deformity bilateral   Plan of Care:  - Patient was evaluated today. MRIs were reviewed today, which were somewhat  noncontributory to the patient's symptoms - Injection of 0.5 mL Celestone Soluspan injected in the plantar fibroma right foot - Continue gabapentin 2700 mg per day -  Today we are going to order studies for nerve conduction velocities bilateral lower extremities. This of her last diagnostic test prior to possible surgical consultation and intervention. The patient has failed all treatment modalities in any satisfactory alleviation of symptoms with the patient - Return to clinic in 3 weeks   Edrick Kins, DPM [facility]  Dr. Edrick Kins, DPM    [address], [place] [address]                Office [phone]  Fax [phone]

## 2017-03-27 NOTE — Telephone Encounter (Addendum)
-----   Message from Edrick Kins, DPM sent at 03/27/2017 10:17 AM EDT ----- Regarding: Nerve conduction studies Please order nerve conduction studies bilateral Diagnosis: Tarsal tunnel syndrome bilateral x 3 mos  Thanks, Dr. Amalia Hailey. Faxed orders to [facility].

## 2017-03-30 ENCOUNTER — Ambulatory Visit (INDEPENDENT_AMBULATORY_CARE_PROVIDER_SITE_OTHER): Payer: Medicaid Other | Admitting: Physical Medicine and Rehabilitation

## 2017-03-30 ENCOUNTER — Encounter: Payer: Medicaid Other | Admitting: Neurology

## 2017-04-04 ENCOUNTER — Telehealth: Payer: Self-pay | Admitting: *Deleted

## 2017-04-04 NOTE — Telephone Encounter (Signed)
Pt states she missed her Nerve Deduction test due to the hurricaine and would like to reschedule. Left message informing pt that she could contact [facility] [phone] and reschedule her test at her convenience.

## 2017-04-17 ENCOUNTER — Ambulatory Visit: Payer: Medicaid Other | Admitting: Podiatry

## 2017-04-20 ENCOUNTER — Encounter: Payer: Medicaid Other | Admitting: Neurology

## 2017-04-26 ENCOUNTER — Ambulatory Visit: Payer: Medicaid Other | Admitting: Podiatry

## 2017-05-04 ENCOUNTER — Other Ambulatory Visit: Payer: Self-pay

## 2017-05-04 ENCOUNTER — Encounter (HOSPITAL_COMMUNITY): Payer: Self-pay

## 2017-05-04 ENCOUNTER — Emergency Department (HOSPITAL_COMMUNITY)
Admission: EM | Admit: 2017-05-04 | Discharge: 2017-05-04 | Disposition: A | Discharge status: Home / Self Care | Payer: Medicaid Other | Attending: Emergency Medicine | Admitting: Emergency Medicine

## 2017-05-04 ENCOUNTER — Telehealth: Payer: Self-pay | Admitting: *Deleted

## 2017-05-04 DIAGNOSIS — I10 Essential (primary) hypertension: Secondary | ICD-10-CM | POA: Diagnosis not present

## 2017-05-04 DIAGNOSIS — R51 Headache: Secondary | ICD-10-CM | POA: Diagnosis not present

## 2017-05-04 DIAGNOSIS — Z79899 Other long term (current) drug therapy: Secondary | ICD-10-CM | POA: Diagnosis not present

## 2017-05-04 DIAGNOSIS — R519 Headache, unspecified: Secondary | ICD-10-CM

## 2017-05-04 DIAGNOSIS — E1140 Type 2 diabetes mellitus with diabetic neuropathy, unspecified: Secondary | ICD-10-CM | POA: Insufficient documentation

## 2017-05-04 DIAGNOSIS — Z7984 Long term (current) use of oral hypoglycemic drugs: Secondary | ICD-10-CM | POA: Diagnosis not present

## 2017-05-04 HISTORY — DX: Migraine, unspecified, not intractable, without status migrainosus: G43.909

## 2017-05-04 MED ORDER — DIPHENHYDRAMINE HCL 50 MG/ML IJ SOLN
12.5000 mg | Freq: Once | INTRAMUSCULAR | Status: AC
  Administered 2017-05-04: 12.5 mg via INTRAVENOUS
  Filled 2017-05-04: qty 1

## 2017-05-04 MED ORDER — KETOROLAC TROMETHAMINE 15 MG/ML IJ SOLN
30.0000 mg | Freq: Once | INTRAMUSCULAR | Status: AC
  Administered 2017-05-04: 30 mg via INTRAVENOUS
  Filled 2017-05-04: qty 2

## 2017-05-04 MED ORDER — METOCLOPRAMIDE HCL 5 MG/ML IJ SOLN
10.0000 mg | Freq: Once | INTRAMUSCULAR | Status: AC
  Administered 2017-05-04: 10 mg via INTRAVENOUS
  Filled 2017-05-04: qty 2

## 2017-05-04 MED ORDER — SODIUM CHLORIDE 0.9 % IV BOLUS (SEPSIS)
1000.0000 mL | Freq: Once | INTRAVENOUS | Status: AC
  Administered 2017-05-04: 1000 mL via INTRAVENOUS

## 2017-05-04 NOTE — Telephone Encounter (Signed)
Unable to leave a message informing pt I was waiting for a response from Dr. Amalia Hailey concerning the refill, pt's mailbox was full.

## 2017-05-04 NOTE — Telephone Encounter (Signed)
My pharmacy called me and told me that I need to get my medication refilled from Dr. Amalia Hailey. They stated that they never received a call back. Could you please give me a call and let me know what is going on with that medication because my feet are really hurting. You can reach me at [phone].

## 2017-05-04 NOTE — ED Provider Notes (Signed)
[facility] EMERGENCY DEPARTMENT Provider Note   CSN: [id number] Arrival date & time: 05/04/17  1811     History   Chief Complaint Chief Complaint  Patient presents with  . Migraine    HPI Briana Alvarez is a 56 y.o. female.  The history is provided by the patient. No language interpreter was used.  Migraine  This is a new problem. The problem occurs constantly. The problem has not changed since onset.Associated symptoms include headaches. Nothing aggravates the symptoms. Nothing relieves the symptoms. She has tried nothing for the symptoms. The treatment provided no relief.  Pt complains of a migraine.  No relief with home medications  Past Medical History:  Diagnosis Date  . Anxiety   . Arthritis   . Depression   . Diabetes mellitus   . Headache    "2 a month"  . Hypertension   . Migraine   . Neuropathy   . Pneumonia     Patient Active Problem List   Diagnosis Date Noted  . PVC's (premature ventricular contractions) 09/27/2016  . 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 Chest pain 06/28/2016  . Daytime hypersomnia 01/11/2016  . Morbid obesity ([place]) 08/27/2015  . Left low back pain 08/27/2015  . Diabetic peripheral neuropathy associated with type 2 diabetes mellitus ([place]) 04/10/2015  . Lumbar radiculopathy, chronic 02/18/2014  . Hypertriglyceridemia 01/08/2014  . Diabetes type 2, controlled ([place]) 07/12/2013  . Insomnia 07/12/2013  . Depression 07/12/2013  . Essential hypertension, benign 07/12/2013  . Peripheral nerve disease 01/16/2013  . Ankle sprain 11/30/2012  . Bipolar affective disorder ([place]) 10/12/2012  . BP (high blood pressure) 10/12/2012  . Diabetes mellitus ([place]) 10/12/2012    Past Surgical History:  Procedure Laterality Date  . ABDOMINAL HYSTERECTOMY    . BREAST EXCISIONAL BIOPSY  2001   LEFT BREAST  . CARPAL TUNNEL RELEASE    . KNEE ARTHROSCOPY Right   . KNEE SURGERY Left   . NECK SURGERY    . SHOULDER ARTHROSCOPY    .  SHOULDER ARTHROSCOPY WITH SUBACROMIAL DECOMPRESSION AND BICEP TENDON REPAIR Right 02/15/2016   Procedure: SHOULDER DIAGNOSTIC OPERATIVE ARTHROSCOPY WITH SUBACROMIAL DECOMPRESSION AND DISTAL CLAVICLE EXCISION;  Surgeon: Meredith Pel, MD;  Location: [facility];  Service: Orthopedics;  Laterality: Right;  . TONSILLECTOMY    . ulner nerve     . V TACH ABLATION N/A 09/27/2016   Procedure: V Tach Ablation;  Surgeon: [name], MD;  Location: [facility];  Service: Cardiovascular;  Laterality: N/A;    OB History    No data available       Home Medications    Prior to Admission medications   Medication Sig Start Date End Date Taking? Authorizing Provider  acetaminophen (TYLENOL) 500 MG tablet Take 1,000 mg by mouth every 6 (six) hours as needed for headache (pain).    [provider]  albuterol (PROVENTIL HFA;VENTOLIN HFA) 108 (90 BASE) MCG/ACT inhaler Inhale 2 puffs into the lungs every 6 (six) hours as needed for wheezing or shortness of breath. 05/09/14   Gregor Hams, MD  baclofen (LIORESAL) 10 MG tablet Take 1 tablet (10 mg total) by mouth 3 (three) times daily. 03/08/17   Meredith Pel, MD  benazepril (LOTENSIN) 20 MG tablet Take 1 tablet (20 mg total) by mouth daily. 07/28/16   Jettie Booze, MD  diclofenac (VOLTAREN) 75 MG EC tablet Take 1 tablet (75 mg total) by mouth 2 (two) times daily. 02/27/17   Daylene Katayama  M, DPM  gabapentin (NEURONTIN) 300 MG capsule TAKE 3 CAPSULES BY MOUTH 3 TIMES A DAY 12/14/15   Funches, Josalyn, MD  metFORMIN (GLUCOPHAGE) 1000 MG tablet TAKE 1 TABLET BY MOUTH 2 TIMES DAILY WITH A MEAL. 09/01/15   Boykin Nearing, MD  methylPREDNISolone (MEDROL) 4 MG tablet Take dosepak as directed 03/08/17   Meredith Pel, MD  Multiple Vitamin (MULTIVITAMIN WITH MINERALS) TABS tablet Take 1 tablet by mouth daily. Women's One a Day 68 +    [provider]  rizatriptan (MAXALT-MLT) 10 MG disintegrating tablet Take 1 tablet (10 mg total)  by mouth as needed for migraine. May repeat in 2 hours if needed 10/16/15   Penumalli, Earlean Polka, MD  topiramate (TOPAMAX) 50 MG tablet Take 1 tablet (50 mg total) by mouth 2 (two) times daily. 10/16/15   Penumalli, Earlean Polka, MD  traZODone (DESYREL) 150 MG tablet Take 2 tablets (300 mg total) by mouth at bedtime. 11/09/15   Boykin Nearing, MD    Family History Family History  Problem Relation Age of Onset  . Cancer Mother   . Hypertension Mother   . Diabetes Daughter   . Diabetes Maternal Grandmother   . Hypertension Maternal Grandmother   . Heart attack Neg Hx   . Stroke Neg Hx     Social History Social History   Tobacco Use  . Smoking status: Never Smoker  . Smokeless tobacco: Never Used  Substance Use Topics  . Alcohol use: No  . Drug use: No     Allergies   Shrimp [shellfish allergy]   Review of Systems Review of Systems  Neurological: Positive for headaches.  All other systems reviewed and are negative.    Physical Exam Updated Vital Signs BP (!) 182/97 (BP Location: Right Arm)   Pulse 64   Temp 98.2 F (36.8 C) (Oral)   Resp 18   Ht 5' 6.5" (1.689 m)   Wt 85.7 kg (189 lb)   LMP  (LMP Unknown)   SpO2 100%   BMI 30.05 kg/m   Physical Exam  Constitutional: She is oriented to person, place, and time. She appears well-developed and well-nourished.  HENT:  Head: Normocephalic.  Right Ear: External ear normal.  Left Ear: External ear normal.  Mouth/Throat: Oropharynx is clear and moist.  Eyes: Conjunctivae and EOM are normal. Pupils are equal, round, and reactive to light.  Neck: Normal range of motion.  Cardiovascular: Normal rate and regular rhythm.  Pulmonary/Chest: Effort normal.  Abdominal: Soft. She exhibits no distension.  Musculoskeletal: Normal range of motion.  Neurological: She is alert and oriented to person, place, and time.  Psychiatric: She has a normal mood and affect.  Nursing note and vitals reviewed.    ED Treatments / Results    Labs (all labs ordered are listed, but only abnormal results are displayed) Labs Reviewed - No data to display  EKG  EKG Interpretation None       Radiology No results found.  Procedures Procedures (including critical care time)  Medications Ordered in ED Medications  sodium chloride 0.9 % bolus 1,000 mL (1,000 mLs Intravenous New Bag/Given 05/04/17 2034)  ketorolac (TORADOL) 15 MG/ML injection 30 mg (30 mg Intravenous Given 05/04/17 2036)  diphenhydrAMINE (BENADRYL) injection 12.5 mg (12.5 mg Intravenous Given 05/04/17 2037)  metoCLOPramide (REGLAN) injection 10 mg (10 mg Intravenous Given 05/04/17 2034)     Initial Impression / Assessment and Plan / ED Course  I have reviewed the triage vital signs and the  nursing notes.  Pertinent labs & imaging results that were available during my care of the patient were reviewed by me and considered in my medical decision making (see chart for details).  Clinical Course as of May 05 2047  Thu May 04, 2017  1858 BP: Marland Kitchen 182/97 [HM]    Clinical Course User Index [HM] Lona Kettle, Student-PA      Final Clinical Impressions(s) / ED Diagnoses   Final diagnoses:  Bad headache    ED Discharge Orders    None     Meds ordered this encounter  Medications  . sodium chloride 0.9 % bolus 1,000 mL  . ketorolac (TORADOL) 15 MG/ML injection 30 mg  . diphenhydrAMINE (BENADRYL) injection 12.5 mg  . metoCLOPramide (REGLAN) injection 10 mg      Fransico Meadow, PA-C 05/04/17 2050    Fransico Meadow, PA-C 05/04/17 2053    Gareth Morgan, MD 05/07/17 1758

## 2017-05-04 NOTE — Telephone Encounter (Signed)
Refill request for Nabumetone 750mg  #60 one tablet bid. I did not see in Chart Review, routed request to Dr. Amalia Hailey.

## 2017-05-04 NOTE — ED Triage Notes (Signed)
Pt endorses migraine since yesterday afternoon with photosensitivity and takes topamax but it has not helped. Hypertensive in triage. No neuro deficits.

## 2017-05-04 NOTE — Discharge Instructions (Signed)
Return if any problems.

## 2017-05-06 NOTE — Telephone Encounter (Signed)
Yes, any time a patient wants a refill on any antiinflammatory it is fine with me. Please refill.  Dr. Amalia Hailey

## 2017-05-08 MED ORDER — NABUMETONE 750 MG PO TABS: 750.0000 mg | ORAL_TABLET | Freq: Every day | ORAL | 1 refills | Status: DC

## 2017-05-08 NOTE — Addendum Note (Signed)
Addended by: Harriett Sine D on: 05/08/2017 02:17 PM   Modules accepted: Orders

## 2017-05-08 NOTE — Telephone Encounter (Signed)
Unable to leave a message the mailbox is full. °

## 2017-05-09 ENCOUNTER — Ambulatory Visit (INDEPENDENT_AMBULATORY_CARE_PROVIDER_SITE_OTHER): Payer: Medicaid Other | Admitting: Neurology

## 2017-05-09 DIAGNOSIS — G629 Polyneuropathy, unspecified: Secondary | ICD-10-CM | POA: Diagnosis not present

## 2017-05-09 MED ORDER — NABUMETONE 750 MG PO TABS: 750.0000 mg | ORAL_TABLET | Freq: Two times a day (BID) | ORAL | 1 refills | Status: DC

## 2017-05-09 NOTE — Telephone Encounter (Signed)
Pt states the Nabumetone should be 2 times a day. I informed pt I had made the change per Dr. Amalia Hailey and it had been sent to the [id number].

## 2017-05-09 NOTE — Procedures (Signed)
[facility]  [address]  [place], [place] [address] Tel: [phone] Fax:  [phone] Test Date:  05/09/2017  Patient: Briana Alvarez DOB: 06-03-1961 Physician: Narda Amber, DO  Sex: Female Height: 5\' 6"  Ref Phys: Daylene Katayama, DPM  ID#: [id number] Temp: 33.1C Technician:    Patient Complaints: This is a 56 year old female referred for evaluation of bilateral feet pain, to evaluate for tarsal tunnel syndrome.  NCV & EMG Findings: Extensive electrodiagnostic testing of the right lower extremity and additional studies of the left shows:  1. Bilateral superficial peroneal, medial and lateral plantar, and the left sural sensory responses are absent. Right sural sensory response shows reduced amplitude. 2. Bilateral peroneal motor responses at the extensor digitorum brevis are absent, and normal at the tibialis anterior. Bilateral tibial motor responses are absent at the abductor digiti quinti, and markedly reduced at the abductor pollicis brevis muscle. 3. Tibial H reflex study is mildly prolonged on the right and normal on the left.  4. Chronic motor axon loss changes are seen affecting the muscles below the knee bilaterally, without accompanied active denervation.   Impression: The electrophysiologic findings are most consistent with a chronic and symmetric sensorimotor axonal polyneuropathy affecting the lower extremities. Overall, these findings are moderate-to-severe in degree electrically.     ___________________________ Narda Amber, DO    Nerve Conduction Studies Anti Sensory Summary Table   Site NR Peak (ms) Norm Peak (ms) P-T Amp (V) Norm P-T Amp  Left Sup Peroneal Anti Sensory (Ant Lat Mall)  33.1C  12 cm NR  <4.6  >4  Right Sup Peroneal Anti Sensory (Ant Lat Mall)  33.1C  12 cm NR  <4.6  >4  Left Sural Anti Sensory (Lat Mall)  33.1C  Calf NR  <4.6  >4  Right Sural Anti Sensory (Lat Mall)  33.1C  Calf    3.8 <4.6 3.8 >4   Motor  Summary Table   Site NR Onset (ms) Norm Onset (ms) O-P Amp (mV) Norm O-P Amp Site1 Site2 Delta-0 (ms) Dist (cm) Vel (m/s) Norm Vel (m/s)  Left Peroneal Motor (Ext Dig Brev)  33.1C  Ankle NR  <6.0  >2.5 B Fib Ankle  0.0  >40  B Fib NR     Poplt B Fib  0.0  >40  Poplt NR            Right Peroneal Motor (Ext Dig Brev)  33.1C  Ankle NR  <6.0  >2.5 B Fib Ankle  0.0  >40  B Fib NR     Poplt B Fib  0.0  >40  Poplt NR            Left Peroneal TA Motor (Tib Ant)  33.1C  Fib Head    3.0 <4.5 4.5 >3 Poplit Fib Head 1.1 7.0 64 >40  Poplit    4.1  4.4         Right Peroneal TA Motor (Tib Ant)  33.1C  Fib Head    2.8 <4.5 3.7 >3 Poplit Fib Head 1.3 7.0 54 >40  Poplit    4.1  3.6         Left Tibial Motor (Abd Hall Brev)  33.1C  Ankle    4.4 <6.0 1.6 >4 Knee Ankle 9.3 39.0 42 >40  Knee    13.7  0.9         Right Tibial Motor (Abd Hall Brev)  33.1C  Ankle    3.4 <6.0 0.8 >4 Knee  Ankle 9.3 40.0 43 >40  Knee    12.7  0.4         Left Tibial (ADQP) Motor (ADQP)  33.1C  Ankle NR  <6.5  >3 Knee Ankle  0.0  >40  Knee NR            Right Tibial (ADQP) Motor (ADQP)  33.1C  Ankle NR  <6.5  >3 Knee Ankle  0.0  >40  Knee NR             Mixed Summary Table   Site NR Peak (ms) Norm Peak (ms) P-T Amp (V) Norm P-T Amp  Left Lateral Plantar Mixed (Med Malleolus)  33.1C  Lateral Foot NR  <3.7  >8  Right Lateral Plantar Mixed (Med Malleolus)  33.1C  Lateral Foot NR  <3.7  >8  Left Medial Plantar Mixed (Med Malleolus)  33.1C  Medial Foot NR  <3.7  >8  Right Medial Plantar Mixed (Med Malleolus)  33.1C  Medial Foot NR  <3.7  >8   H Reflex Studies   NR H-Lat (ms) Lat Norm (ms) L-R H-Lat (ms)  Left Tibial (Gastroc)  33.1C     33.20 <35 2.45  Right Tibial (Gastroc)  33.1C     35.65 <35 2.45   EMG   Side Muscle Ins Act Fibs Psw Fasc Number Recrt Dur Dur. Amp Amp. Poly Poly. Comment  Right AntTibialis Nml Nml Nml Nml 1- Rapid Few 1+ Few 1+ Nml Nml N/A  Right Gastroc Nml Nml Nml Nml 1-  Rapid Few 1+ Few 1+ Nml Nml N/A  Right Flex Dig Long Nml Nml Nml Nml 2- Rapid Some 1+ Some 1+ Nml Nml N/A  Right GluteusMed Nml Nml Nml Nml Nml Nml Nml Nml Nml Nml Nml Nml N/A  Right RectFemoris Nml Nml Nml Nml Nml Nml Nml Nml Nml Nml Nml Nml N/A  Right BicepsFemS Nml Nml Nml Nml Nml Nml Nml Nml Nml Nml Nml Nml N/A  Left AntTibialis Nml Nml Nml Nml 1- Rapid Few 1+ Few 1+ Nml Nml N/A  Left Gastroc Nml Nml Nml Nml 2- Rapid Some 1+ Some 1+ Nml Nml N/A  Left Flex Dig Long Nml Nml Nml Nml 2- Rapid Some 1+ Some 1+ Nml Nml N/A      Waveforms:

## 2017-05-09 NOTE — Addendum Note (Signed)
Addended by: Harriett Sine D on: 05/09/2017 10:41 AM   Modules accepted: Orders

## 2017-05-22 ENCOUNTER — Encounter: Payer: Self-pay | Admitting: Podiatry

## 2017-05-22 ENCOUNTER — Ambulatory Visit (INDEPENDENT_AMBULATORY_CARE_PROVIDER_SITE_OTHER): Payer: Medicaid Other | Admitting: Podiatry

## 2017-05-22 DIAGNOSIS — E0842 Diabetes mellitus due to underlying condition with diabetic polyneuropathy: Secondary | ICD-10-CM

## 2017-05-22 DIAGNOSIS — M21621 Bunionette of right foot: Secondary | ICD-10-CM

## 2017-05-22 DIAGNOSIS — M21622 Bunionette of left foot: Secondary | ICD-10-CM | POA: Diagnosis not present

## 2017-05-22 NOTE — Patient Instructions (Signed)
Pre-Operative Instructions  Congratulations, you have decided to take an important step towards improving your quality of life.  You can be assured that the doctors and staff at [facility] will be with you every step of the way.  Here are some important things you should know:  1. Plan to be at the surgery center/hospital at least 1 (one) hour prior to your scheduled time, unless otherwise directed by the surgical center/hospital staff.  You must have a responsible adult accompany you, remain during the surgery and drive you home.  Make sure you have directions to the surgical center/hospital to ensure you arrive on time. 2. If you are having surgery at [facility] or [facility] hospitals, you will need a copy of your medical history and physical form from your family physician within one month prior to the date of surgery. We will give you a form for your primary physician to complete.  3. We make every effort to accommodate the date you request for surgery.  However, there are times where surgery dates or times have to be moved.  We will contact you as soon as possible if a change in schedule is required.   4. No aspirin/ibuprofen for one week before surgery.  If you are on aspirin, any non-steroidal anti-inflammatory medications (Mobic, Aleve, Ibuprofen) should not be taken seven (7) days prior to your surgery.  You make take Tylenol for pain prior to surgery.  5. Medications - If you are taking daily heart and blood pressure medications, seizure, reflux, allergy, asthma, anxiety, pain or diabetes medications, make sure you notify the surgery center/hospital before the day of surgery so they can tell you which medications you should take or avoid the day of surgery. 6. No food or drink after midnight the night before surgery unless directed otherwise by surgical center/hospital staff. 7. No alcoholic beverages 24-hours prior to surgery.  No smoking 24-hours prior or 24-hours after  surgery. 8. Wear loose pants or shorts. They should be loose enough to fit over bandages, boots, and casts. 9. Don't wear slip-on shoes. Sneakers are preferred. 10. Bring your boot with you to the surgery center/hospital.  Also bring crutches or a walker if your physician has prescribed it for you.  If you do not have this equipment, it will be provided for you after surgery. 11. If you have not been contacted by the surgery center/hospital by the day before your surgery, call to confirm the date and time of your surgery. 12. Leave-time from work may vary depending on the type of surgery you have.  Appropriate arrangements should be made prior to surgery with your employer. 13. Prescriptions will be provided immediately following surgery by your doctor.  Fill these as soon as possible after surgery and take the medication as directed. Pain medications will not be refilled on weekends and must be approved by the doctor. 14. Remove nail polish on the operative foot and avoid getting pedicures prior to surgery. 15. Wash the night before surgery.  The night before surgery wash the foot and leg well with water and the antibacterial soap provided. Be sure to pay special attention to beneath the toenails and in between the toes.  Wash for at least three (3) minutes. Rinse thoroughly with water and dry well with a towel.  Perform this wash unless told not to do so by your physician.  Enclosed: 1 Ice pack (please put in freezer the night before surgery)   1 Hibiclens skin cleaner     Pre-op instructions  If you have any questions regarding the instructions, please do not hesitate to call our office.  [place]: [address], [place], [place] [address] -- [phone]  [place]: [address] [place], [place] [address] -- [phone]  [place]: [address]  [place], [place] [address] -- [phone]  [place]: [address], [address], [place], [place] [address] -- [phone]  Website:  [email or web address] 

## 2017-05-24 ENCOUNTER — Ambulatory Visit (INDEPENDENT_AMBULATORY_CARE_PROVIDER_SITE_OTHER): Payer: Medicaid Other | Admitting: Orthopedic Surgery

## 2017-05-24 NOTE — Progress Notes (Signed)
   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 She is here to review the nerve conduction study results. She reports she is unable to wear regular socks because it increases her pain. Patient is here for further evaluation and treatment.   Past Medical History:  Diagnosis Date  . Anxiety   . Arthritis   . Depression   . Diabetes mellitus   . Headache    "2 a month"  . Hypertension   . Migraine   . Neuropathy   . Pneumonia      Objective: Physical Exam General: The patient is alert and oriented x3 in no acute distress.  Dermatology: Skin is cool, dry and supple bilateral lower extremities. Negative for open lesions or macerations.  Vascular: Palpable pedal pulses bilaterally. No edema or erythema noted. Capillary refill within normal limits.  Neurological: Epicritic and protective threshold grossly intact bilaterally.   Musculoskeletal Exam: Range of motion within normal limits to all pedal and ankle joints bilateral. Muscle strength 5/5 in all groups bilateral. Clinical evidence of tailor's bunion deformity to the bilateral feet with a prominent fifth metatarsal head and lateral deviation. There is pain on palpation and range of motion also noted to the fifth MPJ.  NCV & EMG Impression: The electrophysiologic findings are most consistent with a chronic and symmetric sensorimotor axonal polyneuropathy affecting the lower extremities. Overall, these findings are moderate-to-severe in degree electrically.  Assessment: 1. Tailor's bunion deformity bilateral lower extremity 2. DM with polyneuropathy bilaterally   Plan of Care:  1. Patient was evaluated. 2. Nerve conduction studies reviewed. 3. Recommended follow up with PCP. 4. Today we discussed the conservative versus surgical management of the presenting pathology. The patient opts for surgical management. All possible  complications and details of the procedure were explained. All patient questions were answered. No guarantees were expressed or implied. 5. Authorization for surgery was initiated today. Surgery will consist of tailor's bunionectomy with fifth metatarsal osteotomy bilaterally. 6. Pt states her diabetes is controlled.  7. Return to clinic 1 week post op.     Edrick Kins, DPM [facility]  Dr. Edrick Kins, [address]                                        [place], [place] [address]                Office [phone]  Fax [phone]

## 2017-06-05 ENCOUNTER — Ambulatory Visit: Payer: Medicaid Other | Admitting: Internal Medicine

## 2017-06-14 ENCOUNTER — Encounter: Payer: Self-pay | Admitting: Internal Medicine

## 2017-06-30 ENCOUNTER — Ambulatory Visit (INDEPENDENT_AMBULATORY_CARE_PROVIDER_SITE_OTHER): Payer: Medicaid Other | Admitting: Orthopedic Surgery

## 2017-07-03 ENCOUNTER — Ambulatory Visit (INDEPENDENT_AMBULATORY_CARE_PROVIDER_SITE_OTHER): Payer: Medicaid Other | Admitting: Orthopedic Surgery

## 2017-07-03 ENCOUNTER — Encounter (INDEPENDENT_AMBULATORY_CARE_PROVIDER_SITE_OTHER): Payer: Self-pay | Admitting: Orthopedic Surgery

## 2017-07-03 DIAGNOSIS — M542 Cervicalgia: Secondary | ICD-10-CM | POA: Diagnosis not present

## 2017-07-03 MED ORDER — METHOCARBAMOL 500 MG PO TABS: ORAL_TABLET | ORAL | 0 refills | Status: DC

## 2017-07-03 NOTE — Progress Notes (Signed)
Office Visit Note   Patient: Briana Alvarez           Date of Birth: [birth year]           MRN: [id number] Visit Date: 07/03/2017 Requested by: Vicenta Aly, [place], [place] [address] PCP: Vicenta Aly, FNP  Subjective: Chief Complaint  Patient presents with  . Right Shoulder - Pain, Follow-up  . Neck - Pain, Follow-up    HPI: Briana Alvarez is a 57 year old patient with neck and right shoulder pain.  She has had previous C-spine fusion in [place] in 2013.  She is having some right trapezial pain and focal tenderness.  She uses bareback and body for this problem.  She has tried baclofen in the past without relief.  She denies any radicular symptoms.  She states she can live with what she has.              ROS: All systems reviewed are negative as they relate to the chief complaint within the history of present illness.  Patient denies  fevers or chills.   Assessment & Plan: Visit Diagnoses:  1. 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  Shoulder examination is normal.  I would favor a trial of Robaxin and if that does not help we could work the neck up further.  For now I do not think her symptoms clinically warrant the small risk of a cervical spine injection.  Follow-Up Instructions: Return if symptoms worsen or fail to improve.   Orders:  No orders of the defined types were placed in this encounter.  Meds ordered this encounter  Medications  . methocarbamol (ROBAXIN) 500 MG tablet    Sig: 1 po q 12 prn    Dispense:  30 tablet    Refill:  0      Procedures: No procedures performed   Clinical Data: No additional findings.  Objective: Vital Signs: LMP  (LMP Unknown)   Physical Exam:   Constitutional: Patient appears well-developed HEENT:  Head: Normocephalic Eyes:EOM are normal Neck: Normal range of motion Cardiovascular: Normal rate Pulmonary/chest:  Effort normal Neurologic: Patient is alert Skin: Skin is warm Psychiatric: Patient has normal mood and affect    Ortho Exam: Orthopedic exam demonstrates very good shoulder strength with rotator cuff testing.  No restriction of passive range of motion of the right shoulder.  Impingement signs negative on the right.  No AC joint tenderness present.  Cervical spine range of motion full.  She does have some tenderness in a focal trigger point type pain in that right trapezial region.  No scapular dyskinesia with forward flexion.  Specialty Comments:  No specialty comments available.  Imaging: No results found.   PMFS History: Patient Active Problem List   Diagnosis Date Noted  . PVC's (premature ventricular contractions) 09/27/2016  . 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 Chest pain 06/28/2016  . Daytime hypersomnia 01/11/2016  . Morbid obesity ([place]) 08/27/2015  . Left low back pain 08/27/2015  . Diabetic peripheral neuropathy associated with type 2 diabetes mellitus ([place]) 04/10/2015  . Lumbar radiculopathy, chronic 02/18/2014  . Hypertriglyceridemia 01/08/2014  . Diabetes type 2, controlled ([place]) 07/12/2013  . Insomnia 07/12/2013  . Depression 07/12/2013  . Essential hypertension, benign 07/12/2013  . Peripheral nerve disease 01/16/2013  . Ankle sprain 11/30/2012  . Bipolar affective disorder ([place]) 10/12/2012  . BP (high blood pressure) 10/12/2012  . Diabetes  mellitus ([place]) 10/12/2012   Past Medical History:  Diagnosis Date  . Anxiety   . Arthritis   . Depression   . Diabetes mellitus   . Headache    "2 a month"  . Hypertension   . Migraine   . Neuropathy   . Pneumonia     Family History  Problem Relation Age of Onset  . Cancer Mother   . Hypertension Mother   . Diabetes Daughter   . Diabetes Maternal Grandmother   . Hypertension Maternal Grandmother   . Heart attack Neg Hx   . Stroke Neg Hx     Past Surgical History:  Procedure Laterality Date  . ABDOMINAL  HYSTERECTOMY    . BREAST EXCISIONAL BIOPSY  2001   LEFT BREAST  . CARPAL TUNNEL RELEASE    . KNEE ARTHROSCOPY Right   . KNEE SURGERY Left   . NECK SURGERY    . SHOULDER ARTHROSCOPY    . SHOULDER ARTHROSCOPY WITH SUBACROMIAL DECOMPRESSION AND BICEP TENDON REPAIR Right 02/15/2016   Procedure: SHOULDER DIAGNOSTIC OPERATIVE ARTHROSCOPY WITH SUBACROMIAL DECOMPRESSION AND DISTAL CLAVICLE EXCISION;  Surgeon: Meredith Pel, MD;  Location: [facility];  Service: Orthopedics;  Laterality: Right;  . TONSILLECTOMY    . ulner nerve     . V TACH ABLATION N/A 09/27/2016   Procedure: V Tach Ablation;  Surgeon: [name], MD;  Location: [facility];  Service: Cardiovascular;  Laterality: N/A;   Social History   Occupational History  . Not on file  Tobacco Use  . Smoking status: Never Smoker  . Smokeless tobacco: Never Used  Substance and Sexual Activity  . Alcohol use: No  . Drug use: No  . Sexual activity: Not on file

## 2017-07-18 ENCOUNTER — Telehealth: Payer: Self-pay | Admitting: *Deleted

## 2017-07-18 ENCOUNTER — Other Ambulatory Visit: Payer: Self-pay | Admitting: Nurse Practitioner

## 2017-07-18 DIAGNOSIS — Z1231 Encounter for screening mammogram for malignant neoplasm of breast: Secondary | ICD-10-CM

## 2017-07-18 NOTE — Telephone Encounter (Signed)
"  I'm scheduled for surgery on February 14 with Dr. Amalia Hailey.  I didn't realize that was Valentine's Day.  I want to know if we can do it the week before.  Give me a call."  I attempted to call the patient.  I left her a message to call tomorrow and ask to speak to Coralee Pesa.  Dr. Amalia Hailey does not have anything available on February 7.  His next available is February 21 or March 7.

## 2017-07-19 ENCOUNTER — Ambulatory Visit: Payer: Medicaid Other | Admitting: Internal Medicine

## 2017-07-19 NOTE — Telephone Encounter (Signed)
Pt called back saying she got Briana Alvarez's message yesterday and would just like to keep her surgery scheduled on 14 February. I told the pt I would let Briana Alvarez know.

## 2017-07-22 ENCOUNTER — Other Ambulatory Visit (INDEPENDENT_AMBULATORY_CARE_PROVIDER_SITE_OTHER): Payer: Self-pay | Admitting: Orthopedic Surgery

## 2017-07-24 NOTE — Telephone Encounter (Signed)
Ok to rf? 

## 2017-07-24 NOTE — Telephone Encounter (Signed)
y

## 2017-07-31 ENCOUNTER — Ambulatory Visit (INDEPENDENT_AMBULATORY_CARE_PROVIDER_SITE_OTHER): Payer: Self-pay

## 2017-07-31 ENCOUNTER — Ambulatory Visit (INDEPENDENT_AMBULATORY_CARE_PROVIDER_SITE_OTHER): Payer: Medicaid Other | Admitting: Specialist

## 2017-07-31 ENCOUNTER — Encounter (INDEPENDENT_AMBULATORY_CARE_PROVIDER_SITE_OTHER): Payer: Self-pay | Admitting: Specialist

## 2017-07-31 ENCOUNTER — Ambulatory Visit (INDEPENDENT_AMBULATORY_CARE_PROVIDER_SITE_OTHER): Payer: Medicaid Other

## 2017-07-31 VITALS — Ht 66.5 in | Wt 189.0 lb

## 2017-07-31 DIAGNOSIS — M5416 Radiculopathy, lumbar region: Secondary | ICD-10-CM

## 2017-07-31 DIAGNOSIS — M545 Low back pain: Secondary | ICD-10-CM

## 2017-07-31 DIAGNOSIS — M25551 Pain in right hip: Secondary | ICD-10-CM

## 2017-07-31 DIAGNOSIS — M25552 Pain in left hip: Secondary | ICD-10-CM

## 2017-07-31 NOTE — Progress Notes (Signed)
Office Visit Note   Patient: Briana Alvarez           Date of Birth: 10/30/1960           MRN: [id number] Visit Date: 07/31/2017              Requested by: Vicenta Aly, [address] [place], [place] [address] PCP: Vicenta Aly, FNP   Assessment & Plan: Visit Diagnoses:  1. Low back pain, unspecified back pain laterality, unspecified chronicity, with sciatica presence unspecified   2. Pain of both hip joints   3. Radiculopathy, lumbar region     Plan: With patient's ongoing and worsening back pain and left greater than right lower extremity radiculopathy and neurogenic claudication symptoms since previous scan 2015 we will repeat lumbar MRI.   Follow-up in office with Dr. Louanne Skye after completion to discuss results and further treatment options. Patient can continue taking Robaxin and Neurontin that she currently has.   Follow-Up Instructions: Return in about 3 weeks (around 08/21/2017) for Dr Louanne Skye to review lumbar MRI scan.   Orders:  Orders Placed This Encounter  Procedures  . XR Lumbar Spine 2-3 Views  . XR HIP UNILAT W OR W/O PELVIS 2-3 VIEWS RIGHT  . MR Lumbar Spine w/o contrast   No orders of the defined types were placed in this encounter.     Procedures: No procedures performed   Clinical Data: No additional findings.   Subjective: Chief Complaint  Patient presents with  . Lower Back - Pain  . Right Hip - Pain  . Left Hip - Pain    HPI Patient comes in today with complaints of worsening low back pain and left greater than right buttock/thigh pain.  Reviewed patient's records in epic and she has been seen by Dr. Yvetta Coder in 2015 who ordered a lumbar MRI which was done on January 24, 2014.  Report at that time showed:  FINDINGS: Stable alignment of the lumbar vertebral bodies. They demonstrate normal marrow signal except for endplate reactive changes and scattered hemangiomas. The conus medullaris terminates at L1-2. Stable facet  disease. No pars defects.  No significant paraspinal or retroperitoneal findings.  L1-2:  No significant findings.  L2-3: Mild annular bulge and moderate facet disease with mild bilateral lateral recess stenosis. No spinal stenosis. There is a shallow broad-based lateral foraminal and extra foraminal disc protrusion on the left slightly displacing the left L2 nerve root. These findings are stable.  L3-4: Broad-based bulging annulus, short pedicles and moderate facet disease contributing to mild spinal stenosis, moderate bilateral lateral recess stenosis and mild bilateral foraminal stenosis. These findings are stable.  L4-5: Shallow central disc protrusion with mild mass effect on the ventral thecal sac. There is also mild bilateral lateral recess stenosis and mild bilateral foraminal stenosis. These findings appear stable.  L5-S1: Advanced degenerative disc disease with disc space narrowing an endplate reactive changes. There is also advanced facet disease. No significant spinal stenosis. There is mild bilateral foraminal stenosis which appears stable.  IMPRESSION: Advanced degenerative lumbar spondylosis with multilevel disc disease and facet disease. Stable multilevel spinal, lateral recess and foraminal stenosis as discussed above. No significant change when compared to prior examination.  She was also seen by Dr [facility]ett [facility] neurosurgeon with [facility] March 04, 2014 and L3-4 decompression was discussed but then conservative management was further recommended.  Patient has not had ESI's.  States that currently back pain is worse than left greater than  right hip pain at times.  Pain radiates into the left buttock, lateral hip/thigh and around the anterior hip.  Nothing below the knee.  Similar symptoms on the right side but not as bad as the left.  Patient describes having neurogenic claudication symptoms and walking distances have decreased.  In the  grocery store she has to lean over the cart to help try to relieve her symptoms and allow her to walk further.  No complaint of bowel or bladder incontinence.  Patient is scheduled to have left foot surgery next week with [facility]. Review of Systems No current cardiac pulmonary GI GU issues Objective: Vital Signs: Ht 5' 6.5" (1.689 m)   Wt 189 lb (85.7 kg)   LMP  (LMP Unknown)   BMI 30.05 kg/m   Physical Exam  Constitutional: She is oriented to person, place, and time. No distress.  HENT:  Head: Normocephalic and atraumatic.  Eyes: Pupils are equal, round, and reactive to light.  Pulmonary/Chest: No respiratory distress.  Musculoskeletal:  Gait is antalgic.  There is lumbar paraspinal tenderness.  Pain with lumbar extension.  Marked left sciatic notch tenderness greater than right.  Tender over the left hip greater trochanter bursa.  Positive left greater than right straight leg raise.  Negative logroll bilateral hips.  No focal motor deficits.  Bilateral calves nontender.  Neurological: She is alert and oriented to person, place, and time.  Skin: Skin is warm and dry.    Ortho Exam  Specialty Comments:  No specialty comments available.  Imaging: Xr Lumbar Spine 2-3 Views  Result Date: 07/31/2017 X-ray lumbar spine shows disc space collapse L5-S1 greater than L2-3.  Facet arthropathy.  Calcific change in the L3-4 foramen.    PMFS History: Patient Active Problem List   Diagnosis Date Noted  . PVC's (premature ventricular contractions) 09/27/2016  . 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 Chest pain 06/28/2016  . Daytime hypersomnia 01/11/2016  . Morbid obesity ([place]) 08/27/2015  . Left low back pain 08/27/2015  . Diabetic peripheral neuropathy associated with type 2 diabetes mellitus ([place]) 04/10/2015  . Lumbar radiculopathy, chronic 02/18/2014  . Hypertriglyceridemia 01/08/2014  . Diabetes type 2, controlled ([place]) 07/12/2013  . Insomnia 07/12/2013  .  Depression 07/12/2013  . Essential hypertension, benign 07/12/2013  . Peripheral nerve disease 01/16/2013  . Ankle sprain 11/30/2012  . Bipolar affective disorder ([place]) 10/12/2012  . BP (high blood pressure) 10/12/2012  . Diabetes mellitus ([place]) 10/12/2012   Past Medical History:  Diagnosis Date  . Anxiety   . Arthritis   . Depression   . Diabetes mellitus   . Headache    "2 a month"  . Hypertension   . Migraine   . Neuropathy   . Pneumonia     Family History  Problem Relation Age of Onset  . Cancer Mother   . Hypertension Mother   . Diabetes Daughter   . Diabetes Maternal Grandmother   . Hypertension Maternal Grandmother   . Heart attack Neg Hx   . Stroke Neg Hx     Past Surgical History:  Procedure Laterality Date  . ABDOMINAL HYSTERECTOMY    . BREAST EXCISIONAL BIOPSY  2001   LEFT BREAST  . CARPAL TUNNEL RELEASE    . KNEE ARTHROSCOPY Right   . KNEE SURGERY Left   . NECK SURGERY    . SHOULDER ARTHROSCOPY    . SHOULDER ARTHROSCOPY WITH SUBACROMIAL DECOMPRESSION AND BICEP TENDON REPAIR Right 02/15/2016   Procedure: SHOULDER DIAGNOSTIC  OPERATIVE ARTHROSCOPY WITH SUBACROMIAL DECOMPRESSION AND DISTAL CLAVICLE EXCISION;  Surgeon: Meredith Pel, MD;  Location: [facility];  Service: Orthopedics;  Laterality: Right;  . TONSILLECTOMY    . ulner nerve     . V TACH ABLATION N/A 09/27/2016   Procedure: V Tach Ablation;  Surgeon: [name], MD;  Location: [facility];  Service: Cardiovascular;  Laterality: N/A;   Social History   Occupational History  . Not on file  Tobacco Use  . Smoking status: Never Smoker  . Smokeless tobacco: Never Used  Substance and Sexual Activity  . Alcohol use: No  . Drug use: No  . Sexual activity: Not on file

## 2017-08-02 ENCOUNTER — Ambulatory Visit (INDEPENDENT_AMBULATORY_CARE_PROVIDER_SITE_OTHER): Payer: Medicaid Other | Admitting: Podiatry

## 2017-08-02 ENCOUNTER — Encounter: Payer: Self-pay | Admitting: Podiatry

## 2017-08-02 DIAGNOSIS — M722 Plantar fascial fibromatosis: Secondary | ICD-10-CM

## 2017-08-02 DIAGNOSIS — M21621 Bunionette of right foot: Secondary | ICD-10-CM

## 2017-08-02 DIAGNOSIS — M21622 Bunionette of left foot: Secondary | ICD-10-CM

## 2017-08-02 DIAGNOSIS — E0842 Diabetes mellitus due to underlying condition with diabetic polyneuropathy: Secondary | ICD-10-CM | POA: Diagnosis not present

## 2017-08-02 NOTE — Progress Notes (Signed)
   Subjective: 57 year old female presenting today for follow up evaluation of bilateral Tailor's bunions and bilateral peripheral neuropathy.  Patient is scheduled for surgery tomorrow regarding her tailor's bunion deformities and she has some questions and would like to review the surgery tomorrow. She also complains of severe pain and tenderness to the plantar medial arch of the right foot.  She states that she notices significant swelling with a soft tissue mass underlying the medial plantar arch.  At the end of the day she states it is extremely painful.  This is been ongoing for several months.  Past Medical History:  Diagnosis Date  . Anxiety   . Arthritis   . Depression   . Diabetes mellitus   . Headache    "2 a month"  . Hypertension   . Migraine   . Neuropathy   . Pneumonia      Objective: Physical Exam General: The patient is alert and oriented x3 in no acute distress.  Dermatology: Skin is cool, dry and supple bilateral lower extremities. Negative for open lesions or macerations.  Vascular: Palpable pedal pulses bilaterally. No edema or erythema noted. Capillary refill within normal limits.  Neurological: Epicritic and protective threshold grossly intact bilaterally.   Musculoskeletal Exam: Range of motion within normal limits to all pedal and ankle joints bilateral. Muscle strength 5/5 in all groups bilateral. Clinical evidence of tailor's bunion deformity to the bilateral feet with a prominent fifth metatarsal head and lateral deviation. There is pain on palpation and range of motion also noted to the fifth MPJ. Significant amount of pain on palpation also noted to the medial longitudinal arch of the right foot.  There is a palpable soft tissue mass that appears more fluctuant than a standard plantar fibroma.  Change in size with swelling noted throughout the day per the patient.  NCV & EMG Impression: The electrophysiologic findings are most consistent with a chronic  and symmetric sensorimotor axonal polyneuropathy affecting the lower extremities. Overall, these findings are moderate-to-severe in degree electrically.  Assessment: 1. Tailor's bunion deformity bilateral lower extremity 2. DM with polyneuropathy bilaterally 3.  Symptomatic soft tissue mass medial longitudinal arch right foot   Plan of Care:  1. Patient was evaluated. 2.  Surgery scheduled for tomorrow regarding the tailor's bunions were discussed again today.  Details the procedure and possible complications were explained.  No guarantees were expressed or implied.   3.  Authorization for surgery consists of: Tailor's bunionectomy with fifth metatarsal osteotomy bilateral 4.  I explained to the patient regarding the right soft tissue mass that at one point we may need to get an MRI to better visualize the soft tissue mass.  Patient has received one prior injection into the area which helped for approximately 4 weeks only.  Patient may benefit from excision of soft tissue mass in the future after visualization with an MRI 5.  Return to clinic 1 week postop  Edrick Kins, DPM [facility]  Dr. Edrick Kins, [address]                                        [place], [place] [address]                Office [phone]  Fax [phone]

## 2017-08-03 DIAGNOSIS — M21541 Acquired clubfoot, right foot: Secondary | ICD-10-CM | POA: Diagnosis not present

## 2017-08-07 ENCOUNTER — Other Ambulatory Visit: Payer: Self-pay | Admitting: Interventional Cardiology

## 2017-08-08 NOTE — Patient Instructions (Signed)
Plan: With patient's ongoing and worsening back pain and left greater than right lower extremity radiculopathy and neurogenic claudication symptoms since previous scan 2015 we will repeat lumbar MRI.   Follow-up in office with Dr. Louanne Skye after completion to discuss results and further treatment options. Patient can continue taking Robaxin and Neurontin that she currently has.   Follow-Up Instructions: Return in about 3 weeks (around 08/21/2017) for Dr Louanne Skye to review lumbar MRI scan.

## 2017-08-09 ENCOUNTER — Ambulatory Visit (INDEPENDENT_AMBULATORY_CARE_PROVIDER_SITE_OTHER): Payer: Medicaid Other

## 2017-08-09 ENCOUNTER — Ambulatory Visit (INDEPENDENT_AMBULATORY_CARE_PROVIDER_SITE_OTHER): Payer: Medicaid Other | Admitting: Podiatry

## 2017-08-09 ENCOUNTER — Encounter: Payer: Self-pay | Admitting: Podiatry

## 2017-08-09 VITALS — BP 139/74 | HR 41 | Temp 98.2°F

## 2017-08-09 DIAGNOSIS — M21622 Bunionette of left foot: Secondary | ICD-10-CM

## 2017-08-09 DIAGNOSIS — M216X1 Other acquired deformities of right foot: Secondary | ICD-10-CM | POA: Diagnosis not present

## 2017-08-09 DIAGNOSIS — M21621 Bunionette of right foot: Secondary | ICD-10-CM

## 2017-08-09 DIAGNOSIS — Z9889 Other specified postprocedural states: Secondary | ICD-10-CM

## 2017-08-09 DIAGNOSIS — M216X2 Other acquired deformities of left foot: Secondary | ICD-10-CM

## 2017-08-09 MED ORDER — DOXYCYCLINE HYCLATE 100 MG PO TABS: 100.0000 mg | ORAL_TABLET | Freq: Two times a day (BID) | ORAL | 0 refills | Status: DC

## 2017-08-09 MED ORDER — OXYCODONE-ACETAMINOPHEN 5-325 MG PO TABS: 1.0000 | ORAL_TABLET | Freq: Four times a day (QID) | ORAL | 0 refills | Status: DC | PRN

## 2017-08-09 NOTE — Progress Notes (Signed)
   Subjective:  Patient presents today status post bilateral Tailor's bunionectomy surgery. DOS: 08/03/17. She states the incisions look well. She has removed the ace wraps because there was blood on them. She denies any new complaints at this time. Patient is here for further evaluation and treatment.    Past Medical History:  Diagnosis Date  . Anxiety   . Arthritis   . Depression   . Diabetes mellitus   . Headache    "2 a month"  . Hypertension   . Migraine   . Neuropathy   . Pneumonia       Objective/Physical Exam Neurovascular status intact.  Skin incisions appear to be well coapted with sutures and staples intact. No sign of infectious process noted. No dehiscence. No active bleeding noted. Minimal drainage noted to right foot incision site. Moderate edema noted to the surgical extremity. Significant amount of pain on palpation also noted to the medial longitudinal arch of the right foot. There is a palpable soft tissue mass that appears more fluctuant than a standard plantar fibroma. Change in size with swelling noted throughout the day per the patient.   Radiographic Exam:  Orthopedic hardware and osteotomies sites appear to be stable with routine healing.  Assessment: 1. s/p bilateral Tailor's bunionectomy surgery. DOS: 08/03/17 2. DM with polyneuropathy bilaterally 3. Symptomatic soft tissue mass medial longitudinal arch right foot    Plan of Care:  1. Patient was evaluated. X-rays reviewed 2. Dressing changed. Continue minimal weightbearing in post op shoes bilaterally. 3. Prescription for Doxycycline #20 provided to patient for prophylaxis.  4. Refill prescription for Percocet provided to patient . 5. Return to clinic in 1 week.   Edrick Kins, DPM [facility]  Dr. Edrick Kins, [address]                                        [place], [place] [address]                Office [phone]  Fax [phone]

## 2017-08-13 ENCOUNTER — Inpatient Hospital Stay: Admission: RE | Admit: 2017-08-13 | Payer: Medicaid Other | Source: Ambulatory Visit

## 2017-08-15 ENCOUNTER — Inpatient Hospital Stay: Admission: RE | Admit: 2017-08-15 | Payer: Medicaid Other | Source: Ambulatory Visit

## 2017-08-16 ENCOUNTER — Encounter: Payer: Self-pay | Admitting: Podiatry

## 2017-08-16 ENCOUNTER — Ambulatory Visit (INDEPENDENT_AMBULATORY_CARE_PROVIDER_SITE_OTHER): Payer: Medicaid Other | Admitting: Podiatry

## 2017-08-16 DIAGNOSIS — M21622 Bunionette of left foot: Secondary | ICD-10-CM

## 2017-08-16 DIAGNOSIS — Z9889 Other specified postprocedural states: Secondary | ICD-10-CM

## 2017-08-16 DIAGNOSIS — M21621 Bunionette of right foot: Secondary | ICD-10-CM

## 2017-08-20 NOTE — Progress Notes (Signed)
   Subjective:  Patient presents today status post bilateral Tailor's bunionectomy surgery. DOS: 08/03/17. She states she is doing well overall. Her pain is tolerable with her pain medication. She reports some drainage and swelling of the right fifth toe. She is finishing up her course of antibiotics as directed. Patient is here for further evaluation and treatment.    Past Medical History:  Diagnosis Date  . Anxiety   . Arthritis   . Depression   . Diabetes mellitus   . Headache    "2 a month"  . Hypertension   . Migraine   . Neuropathy   . Pneumonia       Objective/Physical Exam Neurovascular status intact.  Skin incisions appear to be well coapted with sutures and staples intact. No sign of infectious process noted. No dehiscence. No active bleeding noted. Minimal drainage noted to right foot incision site. Moderate edema noted to the surgical extremity. Significant amount of pain on palpation also noted to the medial longitudinal arch of the right foot. There is a palpable soft tissue mass that appears more fluctuant than a standard plantar fibroma. Change in size with swelling noted throughout the day per the patient.   Assessment: 1. s/p bilateral Tailor's bunionectomy surgery. DOS: 08/03/17 2. DM with polyneuropathy bilaterally 3. Symptomatic soft tissue mass medial longitudinal arch right foot    Plan of Care:  1. Patient was evaluated.  2. Staples removed.  3. Compression anklets dispensed bilaterally.  4. Continue weightbearing in post op shoes.  5. Prescription for neuropathy pain cream to be dispensed from [facility] [place].  6. Return to clinic in two weeks.    Edrick Kins, DPM [facility]  Dr. Edrick Kins, [address]                                        [place], [place] [address]                Office [phone]  Fax [phone]

## 2017-08-21 ENCOUNTER — Ambulatory Visit: Payer: Medicaid Other

## 2017-08-22 ENCOUNTER — Ambulatory Visit (INDEPENDENT_AMBULATORY_CARE_PROVIDER_SITE_OTHER): Payer: Medicaid Other | Admitting: Specialist

## 2017-08-23 ENCOUNTER — Encounter: Payer: Self-pay | Admitting: Internal Medicine

## 2017-08-23 ENCOUNTER — Ambulatory Visit (INDEPENDENT_AMBULATORY_CARE_PROVIDER_SITE_OTHER): Payer: Medicaid Other | Admitting: Internal Medicine

## 2017-08-23 VITALS — BP 132/82 | HR 70 | Ht 66.5 in | Wt 196.0 lb

## 2017-08-23 DIAGNOSIS — I493 Ventricular premature depolarization: Secondary | ICD-10-CM

## 2017-08-23 DIAGNOSIS — I10 Essential (primary) hypertension: Secondary | ICD-10-CM

## 2017-08-23 NOTE — Patient Instructions (Addendum)
Medication Instructions:  Your physician recommends that you continue on your current medications as directed. Please refer to the Current Medication list given to you today.   Labwork: None ordered   Testing/Procedures: None ordered   Follow-Up: Your physician wants you to follow-up in: 6 months with [name], NP You will receive a reminder letter in the mail two months in advance. If you don't receive a letter, please call our office to schedule the follow-up appointment.   Any Other Special Instructions Will Be Listed Below (If Applicable).     If you need a refill on your cardiac medications before your next appointment, please call your pharmacy.   

## 2017-08-23 NOTE — Progress Notes (Signed)
PCP: Vicenta Aly, FNP Primary Cardiologist: Dr Irish Lack Primary EP: Dr Rayann Heman  Briana Alvarez is a 57 y.o. female who presents today for routine electrophysiology followup.  Since last being seen in our clinic, the patient reports doing very well.  She has 3 of her grandchildren in foster care and is expecting twins from her son's girlfriend in September.  She lost her job having to miss work to care for them.  She has some fatigue but is otherwise asymptomatic. Today, she denies symptoms of palpitations, chest pain, shortness of breath,  lower extremity edema, dizziness, presyncope, or syncope.  The patient is otherwise without complaint today.   Past Medical History:  Diagnosis Date  . Anxiety   . Arthritis   . Depression   . Diabetes mellitus   . Headache    "2 a month"  . Hypertension   . Migraine   . Neuropathy   . Pneumonia    Past Surgical History:  Procedure Laterality Date  . ABDOMINAL HYSTERECTOMY    . BREAST EXCISIONAL BIOPSY  2001   LEFT BREAST  . CARPAL TUNNEL RELEASE    . KNEE ARTHROSCOPY Right   . KNEE SURGERY Left   . NECK SURGERY    . SHOULDER ARTHROSCOPY    . SHOULDER ARTHROSCOPY WITH SUBACROMIAL DECOMPRESSION AND BICEP TENDON REPAIR Right 02/15/2016   Procedure: SHOULDER DIAGNOSTIC OPERATIVE ARTHROSCOPY WITH SUBACROMIAL DECOMPRESSION AND DISTAL CLAVICLE EXCISION;  Surgeon: Meredith Pel, MD;  Location: [facility];  Service: Orthopedics;  Laterality: Right;  . TONSILLECTOMY    . ulner nerve     . V TACH ABLATION N/A 09/27/2016   Procedure: V Tach Ablation;  Surgeon: [name], MD;  Location: [facility];  Service: Cardiovascular;  Laterality: N/A;    ROS- all systems are reviewed and negatives except as per HPI above  Current Outpatient Medications  Medication Sig Dispense Refill  . acetaminophen (TYLENOL) 500 MG tablet Take 1,000 mg by mouth every 6 (six) hours as needed for headache (pain).    Marland Kitchen albuterol (PROVENTIL HFA;VENTOLIN HFA) 108  (90 BASE) MCG/ACT inhaler Inhale 2 puffs into the lungs every 6 (six) hours as needed for wheezing or shortness of breath. 1 Inhaler 2  . benazepril (LOTENSIN) 20 MG tablet TAKE 1 TABLET BY MOUTH EVERY DAY 90 tablet 1  . BYETTA 5 MCG PEN 5 MCG/0.02ML SOPN injection Inject 5 mcg as directed 2 (two) times daily before a meal.  6  . gabapentin (NEURONTIN) 300 MG capsule TAKE 3 CAPSULES BY MOUTH 3 TIMES A DAY 270 capsule 0  . metFORMIN (GLUCOPHAGE) 1000 MG tablet TAKE 1 TABLET BY MOUTH 2 TIMES DAILY WITH A MEAL. 60 tablet 3  . Multiple Vitamin (MULTIVITAMIN WITH MINERALS) TABS tablet Take 1 tablet by mouth daily. Women's One a Day 50 +    . nabumetone (RELAFEN) 750 MG tablet Take 1 tablet (750 mg total) by mouth 2 (two) times daily. 60 tablet 1  . NON FORMULARY [facility]  Peripheral Neuropathy Cream- Bupivacaine 1%, Doxepin 3%, Gabapentin 6%, Pentoxifylline 3%, Topiramate 1% Apply 1-2 grams to affected area 3-4 times daily Qty. 120 gm 3 refills    . rizatriptan (MAXALT-MLT) 10 MG disintegrating tablet Take 1 tablet (10 mg total) by mouth as needed for migraine. May repeat in 2 hours if needed 9 tablet 11  . sertraline (ZOLOFT) 50 MG tablet Take 50 mg by mouth daily.    Marland Kitchen topiramate (TOPAMAX) 50 MG tablet Take 1 tablet (  50 mg total) by mouth 2 (two) times daily. 60 tablet 12  . traZODone (DESYREL) 150 MG tablet Take 2 tablets (300 mg total) by mouth at bedtime. 180 tablet 5   Current Facility-Administered Medications  Medication Dose Route Frequency Provider Last Rate Last Dose  . betamethasone acetate-betamethasone sodium phosphate (CELESTONE) injection 3 mg  3 mg Intramuscular Once Daylene Katayama M, DPM      . betamethasone acetate-betamethasone sodium phosphate (CELESTONE) injection 3 mg  3 mg Intramuscular Once Daylene Katayama M, DPM      . betamethasone acetate-betamethasone sodium phosphate (CELESTONE) injection 3 mg  3 mg Intramuscular Once Daylene Katayama M, DPM      . betamethasone  acetate-betamethasone sodium phosphate (CELESTONE) injection 3 mg  3 mg Intramuscular Once Edrick Kins, DPM        Physical Exam: Vitals:   08/23/17 0919  BP: 132/82  Pulse: 70  Weight: 196 lb (88.9 kg)  Height: 5' 6.5" (1.689 m)    GEN- The patient is well appearing, alert and oriented x 3 today.   Head- normocephalic, atraumatic Eyes-  Sclera clear, conjunctiva pink Ears- hearing intact Oropharynx- clear Lungs- Clear to ausculation bilaterally, normal work of breathing Heart- regularly irregular rhythm, no murmurs, rubs or gallops, PMI not laterally displaced GI- soft, NT, ND, + BS Extremities- no clubbing, cyanosis, or edema  EKG tracing ordered today is personally reviewed and shows sinus rhythm with bigeminal PVCs (LBB, inferior axis, narrow QRS)  Assessment and Plan:  1. 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  Today we discussed repeat ablation.  Currently, she prefers a conservative approach.  She feels well and states "If it aint broke dont fix it"  2. HTN Stable No change required today  Return to see EP NP in 6 months  [name] MD, [facility] 08/23/2017 9:43 AM

## 2017-08-28 NOTE — Progress Notes (Signed)
DOS: 08-03-2017  Tailors Bunionectomy with fifth metatarsal osteotomy bilateral

## 2017-08-30 ENCOUNTER — Encounter: Payer: Self-pay | Admitting: Podiatry

## 2017-08-30 ENCOUNTER — Ambulatory Visit (INDEPENDENT_AMBULATORY_CARE_PROVIDER_SITE_OTHER): Payer: Self-pay | Admitting: Podiatry

## 2017-08-30 ENCOUNTER — Ambulatory Visit (INDEPENDENT_AMBULATORY_CARE_PROVIDER_SITE_OTHER): Payer: Medicaid Other

## 2017-08-30 DIAGNOSIS — M21621 Bunionette of right foot: Secondary | ICD-10-CM | POA: Diagnosis not present

## 2017-08-30 DIAGNOSIS — M21622 Bunionette of left foot: Secondary | ICD-10-CM | POA: Diagnosis not present

## 2017-08-30 DIAGNOSIS — Z9889 Other specified postprocedural states: Secondary | ICD-10-CM

## 2017-08-30 MED ORDER — NABUMETONE 750 MG PO TABS: 750.0000 mg | ORAL_TABLET | Freq: Two times a day (BID) | ORAL | 1 refills | Status: DC

## 2017-08-30 NOTE — Progress Notes (Signed)
   Subjective:  Patient presents today status post bilateral Tailor's bunionectomy surgery. DOS: 08/03/17. She states she is doing well overall.  The patient states that she is significantly improved and denies any pain at the moment.  Last office visit on 08/16/2017 she was instructed to continue wearing her postoperative shoes bilateral, however she presented today wearing sneakers bilaterally.  Otherwise she is very happy so far regarding her surgery.  She presents today for further treatment evaluation  Past Medical History:  Diagnosis Date  . Anxiety   . Arthritis   . Depression   . Diabetes mellitus   . Headache    "2 a month"  . Hypertension   . Migraine   . Neuropathy   . Pneumonia       Objective/Physical Exam Neurovascular status intact.  Skin incisions appear to be well coapted with sutures and staples intact. No sign of infectious process noted. No dehiscence. No active bleeding noted. Minimal drainage noted to right foot incision site. Moderate edema noted to the surgical extremity. Negative for any palate pain on palpation to the surgical talus bunionectomy sites.  There is some mild pain on palpation with the palpable nodules to the plantar arch of the foot bilateral suspicious for plantar fibroma.  Radiographic exam: There is complete displacement of the distal fragment of the osteotomy site of the fifth metatarsal bilaterally likely secondary to movement regarding her wearing sneakers this early on in surgery.  Orthopedic screws are intact, however they do not appear to be holding the osteotomy site.  Assessment: 1. s/p bilateral Tailor's bunionectomy surgery. DOS: 08/03/17 2. DM with polyneuropathy bilaterally 3. Symptomatic soft tissue mass medial longitudinal arch right foot    Plan of Care:  1. Patient was evaluated.  2.  Today explained to the patient that she really needs to be wearing the postoperative shoes bilaterally.  She stated that she has some discomfort  with the postoperative shoes and that is where she is wearing sneakers. 3. Patient recently got the neuropathic pain cream through [employer].  Recommend daily application to alleviate neuropathy symptoms 4.  Refill prescription for nabumetone 750 mg 5.  Return to clinic in 6 weeks for follow-up x-ray  Edrick Kins, DPM [facility]  Dr. Edrick Kins, [place]                                        [place], [place] [address]                Office [phone]  Fax [phone]

## 2017-08-31 ENCOUNTER — Inpatient Hospital Stay: Admission: RE | Admit: 2017-08-31 | Payer: Medicaid Other | Source: Ambulatory Visit

## 2017-09-01 ENCOUNTER — Other Ambulatory Visit: Payer: Medicaid Other

## 2017-09-04 ENCOUNTER — Ambulatory Visit (INDEPENDENT_AMBULATORY_CARE_PROVIDER_SITE_OTHER): Payer: Self-pay | Admitting: Specialist

## 2017-09-13 ENCOUNTER — Ambulatory Visit
Admission: RE | Admit: 2017-09-13 | Discharge: 2017-09-13 | Disposition: A | Discharge status: Home / Self Care | Payer: Medicaid Other | Source: Ambulatory Visit | Attending: Nurse Practitioner | Admitting: Nurse Practitioner

## 2017-09-13 DIAGNOSIS — Z1231 Encounter for screening mammogram for malignant neoplasm of breast: Secondary | ICD-10-CM

## 2017-09-18 ENCOUNTER — Ambulatory Visit (INDEPENDENT_AMBULATORY_CARE_PROVIDER_SITE_OTHER): Payer: Medicaid Other | Admitting: Podiatry

## 2017-09-18 ENCOUNTER — Encounter: Payer: Self-pay | Admitting: Podiatry

## 2017-09-18 ENCOUNTER — Ambulatory Visit (INDEPENDENT_AMBULATORY_CARE_PROVIDER_SITE_OTHER): Payer: Medicaid Other

## 2017-09-18 ENCOUNTER — Other Ambulatory Visit: Payer: Self-pay | Admitting: Podiatry

## 2017-09-18 DIAGNOSIS — M21621 Bunionette of right foot: Secondary | ICD-10-CM | POA: Diagnosis not present

## 2017-09-18 DIAGNOSIS — M21622 Bunionette of left foot: Secondary | ICD-10-CM | POA: Diagnosis not present

## 2017-09-18 DIAGNOSIS — M722 Plantar fascial fibromatosis: Secondary | ICD-10-CM

## 2017-09-18 DIAGNOSIS — E0842 Diabetes mellitus due to underlying condition with diabetic polyneuropathy: Secondary | ICD-10-CM

## 2017-09-20 NOTE — Progress Notes (Signed)
   Subjective:  Patient presents today status post bilateral Tailor's bunionectomy surgery. DOS: 08/03/17. She reports pain to the bilateral arches and reports nodules present to each arch. She has been using the Shertech pain cream which has been helping with her neuropathy. She denies any pain at the incision sites. Patient is here for further evaluation and treatment.   Past Medical History:  Diagnosis Date  . Anxiety   . Arthritis   . Depression   . Diabetes mellitus   . Headache    "2 a month"  . Hypertension   . Migraine   . Neuropathy   . Pneumonia       Objective/Physical Exam Neurovascular status intact.  Skin incisions appear to be well coapted with sutures and staples intact. No sign of infectious process noted. No dehiscence. No active bleeding noted. Minimal drainage noted to right foot incision site. Moderate edema noted to the surgical extremity. Negative for any palate pain on palpation to the surgical talus bunionectomy sites.  There is some mild pain on palpation with the palpable nodules to the plantar arch of the foot bilateral suspicious for plantar fibroma.  Radiographic exam: There is complete displacement of the distal fragment of the osteotomy site of the fifth metatarsal bilaterally likely secondary to movement regarding her wearing sneakers this early on in surgery.  Orthopedic screws are intact, however they do not appear to be holding the osteotomy site. There does appear to be routine healing to the osteotomy sites.  Assessment: 1. s/p bilateral Tailor's bunionectomy surgery. DOS: 08/03/17 2. DM with polyneuropathy bilaterally 3. Plantar fasciitis bilateral - midsubstance     Plan of Care:  1. Patient was evaluated. X-Rays reviewed.  2. Injection of 0.5 mLs Celestone Soluspan injected into the heels bilaterally.  3. Appointment with Liliane Channel for DM shoes and insoles.  4. Continue using Shertech neuropathy pain cream. 5. Return to clinic in 4 weeks.    Edrick Kins, DPM [facility]  Dr. Edrick Kins, [place]                                        [place], [place] [address]                Office [phone]  Fax [phone]

## 2017-10-11 ENCOUNTER — Encounter: Payer: Medicaid Other | Admitting: Podiatry

## 2017-10-16 ENCOUNTER — Ambulatory Visit (INDEPENDENT_AMBULATORY_CARE_PROVIDER_SITE_OTHER): Payer: Medicaid Other

## 2017-10-16 ENCOUNTER — Encounter: Payer: Self-pay | Admitting: Podiatry

## 2017-10-16 ENCOUNTER — Ambulatory Visit (INDEPENDENT_AMBULATORY_CARE_PROVIDER_SITE_OTHER): Payer: Medicaid Other | Admitting: Podiatry

## 2017-10-16 DIAGNOSIS — M21622 Bunionette of left foot: Secondary | ICD-10-CM

## 2017-10-16 DIAGNOSIS — M21621 Bunionette of right foot: Secondary | ICD-10-CM

## 2017-10-16 DIAGNOSIS — M7989 Other specified soft tissue disorders: Secondary | ICD-10-CM

## 2017-10-23 NOTE — Progress Notes (Addendum)
   Subjective:  Patient presents today status post bilateral Tailor's bunionectomy surgery. DOS: 08/03/17.  Patient continues to experience bilateral arch pain more significant on the right foot.  She also notices a significant amount of swelling and edema to the medial longitudinal arch of the right foot.  Patient states that the injections have not helped in the past conservative modalities are unsuccessful to alleviate any type of symptoms.  Past Medical History:  Diagnosis Date  . Anxiety   . Arthritis   . Depression   . Diabetes mellitus   . Headache    "2 a month"  . Hypertension   . Migraine   . Neuropathy   . Pneumonia       Objective/Physical Exam Neurovascular status intact.  Skin incisions appear to be well coapted with sutures and staples intact. No sign of infectious process noted. No dehiscence. No active bleeding noted. Minimal drainage noted to right foot incision site. Moderate edema noted to the surgical extremity. Negative for any palate pain on palpation to the surgical talus bunionectomy sites.  There is some mild pain on palpation with the palpable nodules to the plantar arch of the foot bilateral suspicious for plantar fibroma. Lesion measures approximately 3.0x3.0cm (LxW) and apparently fluctuates in size throughout the day.   Radiographic exam: There is complete displacement of the distal fragment of the osteotomy site of the fifth metatarsal bilaterally likely secondary to movement regarding her wearing sneakers this early on in surgery.  Orthopedic screws are intact, however they do not appear to be holding the osteotomy site. There does appear to be routine healing to the osteotomy sites.  Assessment: 1. s/p bilateral Tailor's bunionectomy surgery. DOS: 08/03/17 2. DM with polyneuropathy bilaterally 3.  Soft tissue mass right medial longitudinal arch   Plan of Care:  1. Patient was evaluated. X-Rays reviewed.  2.  Today we are going to order an MRI right  foot to better visualize the soft tissue mass to the medial longitudinal arch 3.  Return to clinic in 4 weeks to review MRI results   Edrick Kins, DPM [facility]  Dr. Edrick Kins, [place]                                        [place], [place] [address]                Office [phone]  Fax [phone]

## 2017-10-24 ENCOUNTER — Telehealth: Payer: Self-pay | Admitting: *Deleted

## 2017-10-24 DIAGNOSIS — M7989 Other specified soft tissue disorders: Secondary | ICD-10-CM

## 2017-10-24 NOTE — Telephone Encounter (Signed)
-----   Message from Briana Alvarez, DPM sent at 10/23/2017  6:58 PM EDT ----- Regarding: MRI RT foot Please order MRI with or without contrast right foot.   Diagnosis: Soft tissue mass right medial longitudinal arch  Thanks, Dr. Amalia Hailey

## 2017-10-26 NOTE — Telephone Encounter (Signed)
Faxed required clinicals to [facility].

## 2017-10-26 NOTE — Telephone Encounter (Signed)
[facility] - Medicaid requires clinicals to pre-cert for MRI 39432 right foot without contrast.

## 2017-10-30 NOTE — Addendum Note (Signed)
Addended by: Harriett Sine D on: 10/30/2017 10:11 AM   Modules accepted: Orders

## 2017-10-30 NOTE — Telephone Encounter (Signed)
[facility] - [facility] M states Dr. Amalia Hailey would need to change to with and without 73720 MRI right foot when he performs the PEER TO PEER.

## 2017-10-30 NOTE — Telephone Encounter (Addendum)
Manus Gunning [facility] states pt's MRI needs to be changed to 73720 with and without contrast. I told Manus Gunning, pt is allergic to shellfish and shrimp. Manus Gunning states that would not matter with the MRI contrast.

## 2017-10-30 NOTE — Telephone Encounter (Addendum)
[facility] Poli scheduled PEER to PEER for 5:45pm today with Dr. Orland Dec.

## 2017-10-31 NOTE — Telephone Encounter (Signed)
DR. Manvel NUMBER:  [id number]. Faxed orders for 73720 right foot with and without contrast to [facility].

## 2017-10-31 NOTE — Telephone Encounter (Signed)
I informed pt the MRI had been prior authorized and she could contact [facility] to schedule.

## 2017-11-05 ENCOUNTER — Ambulatory Visit
Admission: RE | Admit: 2017-11-05 | Discharge: 2017-11-05 | Disposition: A | Discharge status: Home / Self Care | Payer: Medicaid Other | Source: Ambulatory Visit | Attending: Podiatry | Admitting: Podiatry

## 2017-11-05 DIAGNOSIS — M7989 Other specified soft tissue disorders: Secondary | ICD-10-CM

## 2017-11-05 MED ORDER — GADOBENATE DIMEGLUMINE 529 MG/ML IV SOLN
18.0000 mL | Freq: Once | INTRAVENOUS | Status: AC | PRN
  Administered 2017-11-05: 18 mL via INTRAVENOUS

## 2017-11-20 ENCOUNTER — Ambulatory Visit (INDEPENDENT_AMBULATORY_CARE_PROVIDER_SITE_OTHER): Payer: Medicaid Other | Admitting: Podiatry

## 2017-11-20 ENCOUNTER — Encounter: Payer: Self-pay | Admitting: Podiatry

## 2017-11-20 DIAGNOSIS — M76822 Posterior tibial tendinitis, left leg: Principal | ICD-10-CM

## 2017-11-20 DIAGNOSIS — M76821 Posterior tibial tendinitis, right leg: Secondary | ICD-10-CM

## 2017-11-20 DIAGNOSIS — M7751 Other enthesopathy of right foot: Secondary | ICD-10-CM

## 2017-11-21 ENCOUNTER — Telehealth: Payer: Self-pay | Admitting: Podiatry

## 2017-11-21 NOTE — Telephone Encounter (Signed)
I was in to see Dr. Amalia Hailey yesterday and he said he was going to [facility] [address]. It was supposed to be a prednisone pack. I just called the pharmacy because I had not heard anything yet and they said nothing has been called in yet. Could you find out what is going on with that and call me back at [phone]. Thank you.

## 2017-11-22 NOTE — Progress Notes (Signed)
   Subjective:  Patient presents today status post bilateral Tailor's bunionectomy surgery. DOS: 08/03/17. She states her condition has not changed since her last visit. She is here to review the MRI results. Patient is here for further evaluation and treatment.   Past Medical History:  Diagnosis Date  . Anxiety   . Arthritis   . Depression   . Diabetes mellitus   . Headache    "2 a month"  . Hypertension   . Migraine   . Neuropathy   . Pneumonia       Objective/Physical Exam Neurovascular status intact.  Skin incisions appear to be well coapted. No sign of infectious process noted. No dehiscence. No active bleeding noted. Moderate edema noted to the surgical extremity. Pain on palpation noted to the posterior tibial tendon of the bilateral feet.    MRI Impression: 1. No soft tissue mass along the medial longitudinal arch. No focal soft tissue abnormality. 2. 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 Mild edema and enhancement within the remaining fifth metatarsal shaft is likely stress related. No fracture.   Assessment: 1. s/p bilateral Tailor's bunionectomy surgery. DOS: 08/03/17 2. Insertional PT tendinitis bilateral   Plan of Care:  1. Patient was evaluated. MRI reviewed.  2. Prescription for Medrol Dose Pak provided to patient.  3. Recommended good shoe gear.    Edrick Kins, DPM [facility]  Dr. Edrick Kins, [place]                                        [place], [place] [address]                Office [phone]  Fax [phone]

## 2017-11-23 ENCOUNTER — Other Ambulatory Visit: Payer: Self-pay

## 2017-11-23 MED ORDER — METHYLPREDNISOLONE 4 MG PO TABS: ORAL_TABLET | ORAL | 0 refills | Status: DC

## 2017-12-04 ENCOUNTER — Telehealth: Payer: Self-pay | Admitting: Podiatry

## 2017-12-04 ENCOUNTER — Telehealth: Payer: Self-pay | Admitting: *Deleted

## 2017-12-04 NOTE — Telephone Encounter (Signed)
Pt called and left message stating you gave her an rx for orthotics for [facility] and she says it is expired and asked if you could write her another one. She stated she is going to [facility] to take her grandson this week and is trying to get an appt at the same time.

## 2017-12-04 NOTE — Telephone Encounter (Signed)
Pt states she was given a prescription to [facility] and didn't go and it has expired.

## 2018-01-08 ENCOUNTER — Ambulatory Visit (INDEPENDENT_AMBULATORY_CARE_PROVIDER_SITE_OTHER): Payer: Medicaid Other

## 2018-01-08 ENCOUNTER — Encounter (INDEPENDENT_AMBULATORY_CARE_PROVIDER_SITE_OTHER): Payer: Self-pay | Admitting: Orthopedic Surgery

## 2018-01-08 ENCOUNTER — Ambulatory Visit (INDEPENDENT_AMBULATORY_CARE_PROVIDER_SITE_OTHER): Payer: Medicaid Other | Admitting: Orthopedic Surgery

## 2018-01-08 DIAGNOSIS — M25511 Pain in right shoulder: Secondary | ICD-10-CM

## 2018-01-08 DIAGNOSIS — M542 Cervicalgia: Secondary | ICD-10-CM | POA: Diagnosis not present

## 2018-01-08 MED ORDER — PREDNISONE 5 MG (21) PO TBPK: ORAL_TABLET | ORAL | 0 refills | Status: DC

## 2018-01-09 ENCOUNTER — Encounter (INDEPENDENT_AMBULATORY_CARE_PROVIDER_SITE_OTHER): Payer: Self-pay | Admitting: Orthopedic Surgery

## 2018-01-09 NOTE — Progress Notes (Signed)
Office Visit Note   Patient: Briana Alvarez           Date of Birth: 1961-06-18           MRN: [id number] Visit Date: 01/08/2018 Requested by: Vicenta Aly, [place] [place], [place] [address] PCP: Vicenta Aly, FNP  Subjective: Chief Complaint  Patient presents with  . Right Shoulder - Pain  . Neck - Pain    HPI: Briana Alvarez is a patient with neck and right shoulder pain.  Been going on for several weeks.  She is had previous neck fusion performed in [facility].  She is right-hand dominant.  She reports tightness on the right side of her neck.  She denies any numbness and tingling in the right arm.  She states that the pain does radiate into the shoulder blade.  She had previous right shoulder arthroscopy with distal clavicle excision in 2017.  Pain hurts her mostly at night.              ROS: All systems reviewed are negative as they relate to the chief complaint within the history of present illness.  Patient denies  fevers or chills.   Assessment & Plan: Visit Diagnoses:  1. Right shoulder pain, unspecified chronicity   2. Neck pain     Plan: Impression is adjacent segment disease based on plain radiographs and pain radiation pattern into the shoulder blade on the right.  Plan is Medrol Dosepak to see if that helps.  If not then she may need to consider seeing her operative surgeon for evaluation and possible epidural steroid injections.  Follow-Up Instructions: Return if symptoms worsen or fail to improve.   Orders:  Orders Placed This Encounter  Procedures  . XR Shoulder Right  . XR Cervical Spine 2 or 3 views   Meds ordered this encounter  Medications  . predniSONE (STERAPRED UNI-PAK 21 TAB) 5 MG (21) TBPK tablet    Sig: Take dosepak as directed    Dispense:  21 tablet    Refill:  0      Procedures: No procedures performed   Clinical Data: No additional findings.  Objective: Vital Signs: LMP  (LMP Unknown)   Physical Exam:    Constitutional: Patient appears well-developed HEENT:  Head: Normocephalic Eyes:EOM are normal Neck: Normal range of motion Cardiovascular: Normal rate Pulmonary/chest: Effort normal Neurologic: Patient is alert Skin: Skin is warm Psychiatric: Patient has normal mood and affect    Ortho Exam: Orthopedic exam demonstrates good passive range of motion of the right and left shoulder.  No discrete tenderness over the [facility] joint on the right.  She does have trapezial tenderness to palpation on the right but not the left.  Motor or sensory function to the hand is intact.  Negative shoulder apprehension.  No other masses lymphadenopathy or skin changes noted in that right shoulder girdle region.  Neck range of motion predictably restricted due to the fusion but not excessively so.  Specialty Comments:  No specialty comments available.  Imaging: No results found.   PMFS History: Patient Active Problem List   Diagnosis Date Noted  . PVC's (premature ventricular contractions) 09/27/2016  . 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 Chest pain 06/28/2016  . Daytime hypersomnia 01/11/2016  . Chronic migraine without aura without status migrainosus, not intractable 12/29/2015  . OSA (obstructive sleep apnea) 12/29/2015  . 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 Plantar fasciitis, bilateral 12/29/2015  . Chronic left-sided low back pain with left-sided  sciatica 12/29/2015  . Morbid obesity ([place]) 08/27/2015  . Left low back pain 08/27/2015  . Diabetic peripheral neuropathy associated with type 2 diabetes mellitus ([place]) 04/10/2015  . Lumbar radiculopathy, chronic 02/18/2014  . Hypertriglyceridemia 01/08/2014  . Type II diabetes mellitus, well controlled ([place]) 07/12/2013  . Insomnia 07/12/2013  . Depression 07/12/2013  . Essential hypertension, benign 07/12/2013  . Peripheral nerve disease 01/16/2013  . Peripheral neuropathy 01/16/2013  . Ankle sprain 11/30/2012  . Bipolar affective disorder  ([place]) 10/12/2012  . Essential hypertension 10/12/2012  . Diabetes mellitus ([place] [address]) 10/12/2012   Past Medical History:  Diagnosis Date  . Anxiety   . Arthritis   . Depression   . Diabetes mellitus   . Headache    "2 a month"  . Hypertension   . Migraine   . Neuropathy   . Pneumonia     Family History  Problem Relation Age of Onset  . Cancer Mother   . Hypertension Mother   . Breast cancer Mother   . Diabetes Daughter   . Diabetes Maternal Grandmother   . Hypertension Maternal Grandmother   . Heart attack Neg Hx   . Stroke Neg Hx     Past Surgical History:  Procedure Laterality Date  . ABDOMINAL HYSTERECTOMY    . BREAST EXCISIONAL BIOPSY  2001   LEFT BREAST  . CARPAL TUNNEL RELEASE    . KNEE ARTHROSCOPY Right   . KNEE SURGERY Left   . NECK SURGERY    . SHOULDER ARTHROSCOPY    . SHOULDER ARTHROSCOPY WITH SUBACROMIAL DECOMPRESSION AND BICEP TENDON REPAIR Right 02/15/2016   Procedure: SHOULDER DIAGNOSTIC OPERATIVE ARTHROSCOPY WITH SUBACROMIAL DECOMPRESSION AND DISTAL CLAVICLE EXCISION;  Surgeon: Meredith Pel, MD;  Location: [facility];  Service: Orthopedics;  Laterality: Right;  . TONSILLECTOMY    . ulner nerve     . V TACH ABLATION N/A 09/27/2016   Procedure: V Tach Ablation;  Surgeon: [name], MD;  Location: [facility];  Service: Cardiovascular;  Laterality: N/A;   Social History   Occupational History  . Not on file  Tobacco Use  . Smoking status: Never Smoker  . Smokeless tobacco: Never Used  Substance and Sexual Activity  . Alcohol use: No  . Drug use: No  . Sexual activity: Not on file

## 2018-02-06 ENCOUNTER — Other Ambulatory Visit: Payer: Self-pay | Admitting: Interventional Cardiology

## 2018-02-10 IMAGING — CT CT RENAL STONE PROTOCOL
2 of 4 series · 11 of 46 positions shown, 12 images · non-contrast
Comparison: Radiography 09/02/2015

CLINICAL DATA: Left-sided abdominal pain and nausea over the last 4
days.

EXAM:
CT ABDOMEN AND PELVIS WITHOUT CONTRAST
TECHNIQUE: Multidetector CT imaging of the abdomen and pelvis was performed
following the standard protocol without IV contrast.

[Series 201: stone study, idose (2) · axial · 0.83mm/px · z∈[+45,+430]mm · 8 of 93 slices shown, 9 images]
[im 8/93  soft-tissue]
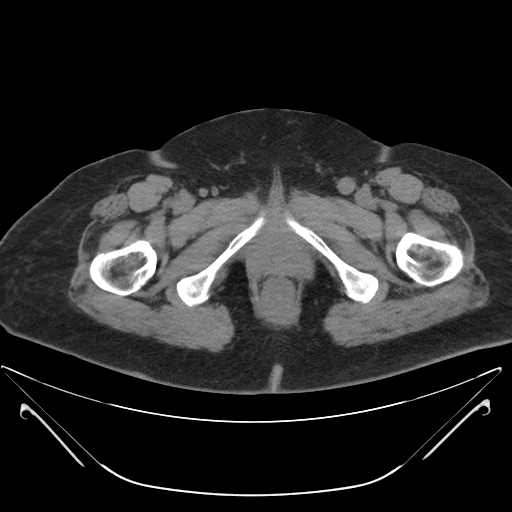
[im 8/93  bone]
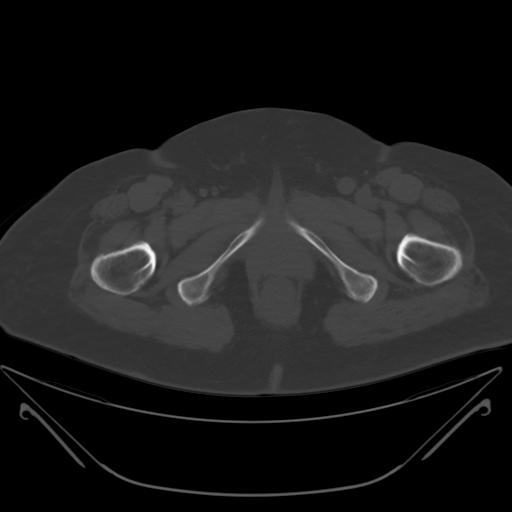
[im 20/93  soft-tissue]
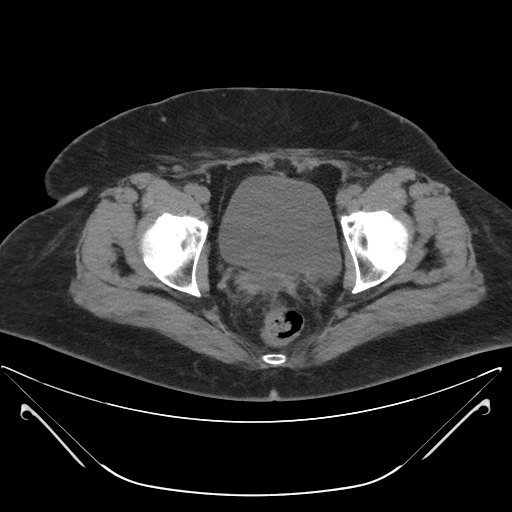
[im 31/93  soft-tissue]
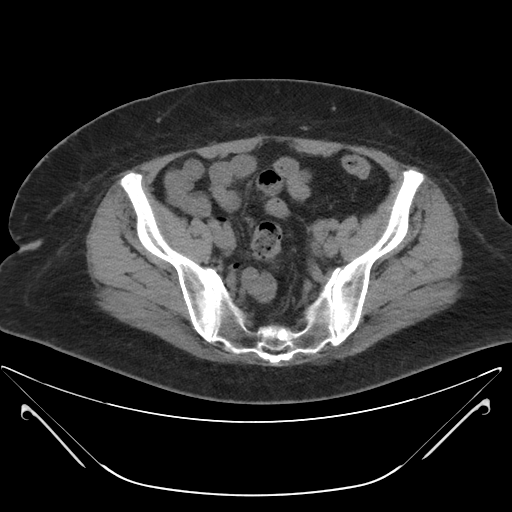
[im 43/93  soft-tissue]
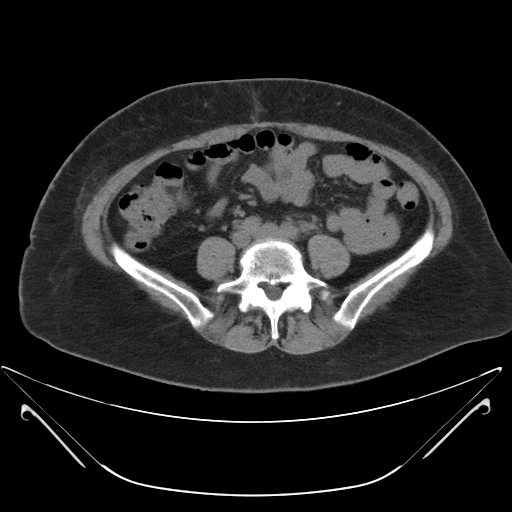
[im 50/93  soft-tissue]
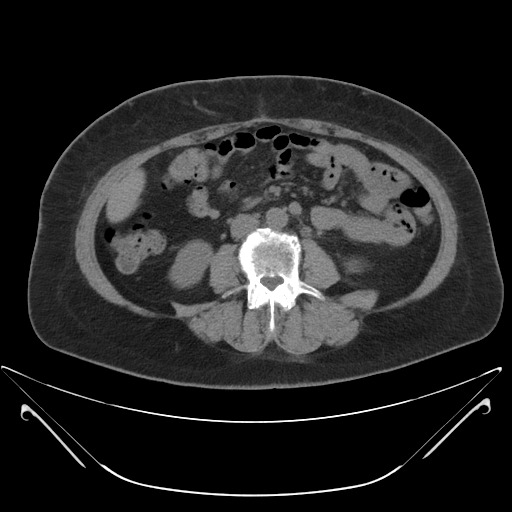
[im 62/93  soft-tissue]
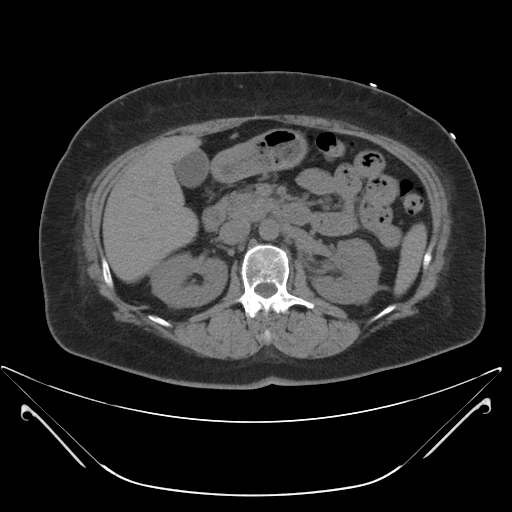
[im 73/93  soft-tissue]
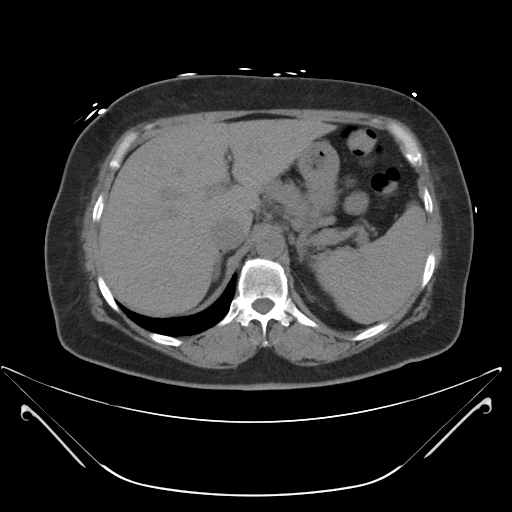
[im 85/93  soft-tissue]
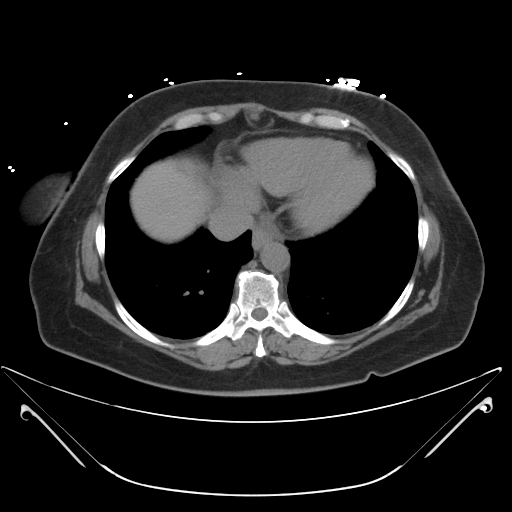

[Series 203: coronals, idose (2) · coronal · 0.45mm/px · 3 of 115 slices shown]
[im 39/115  soft-tissue]
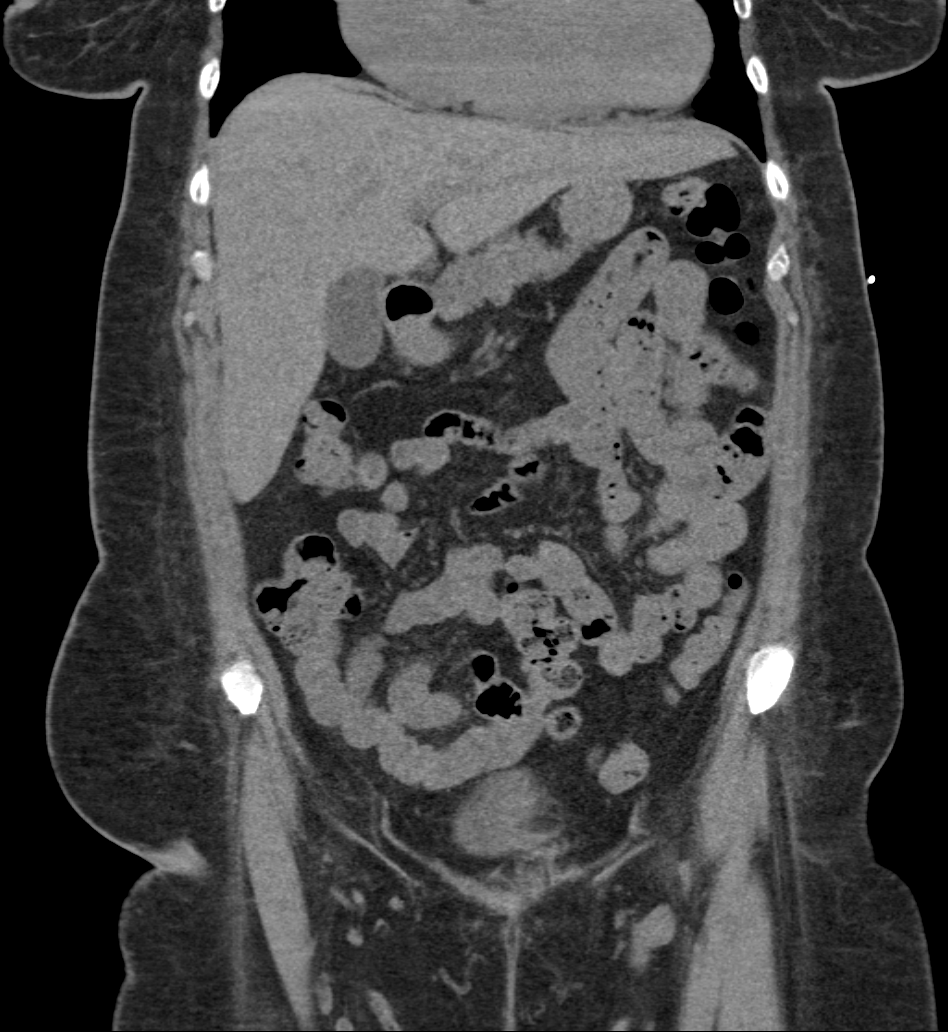
[im 51/115  soft-tissue]
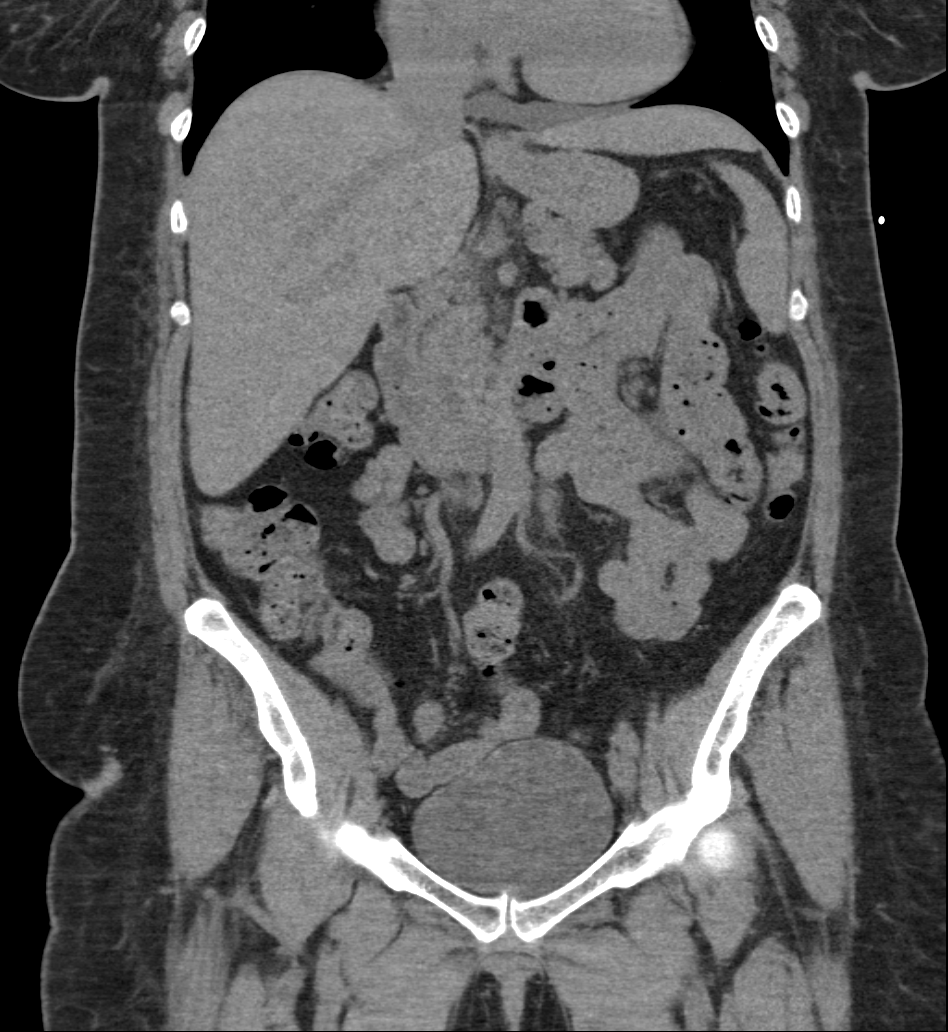
[im 64/115  soft-tissue]
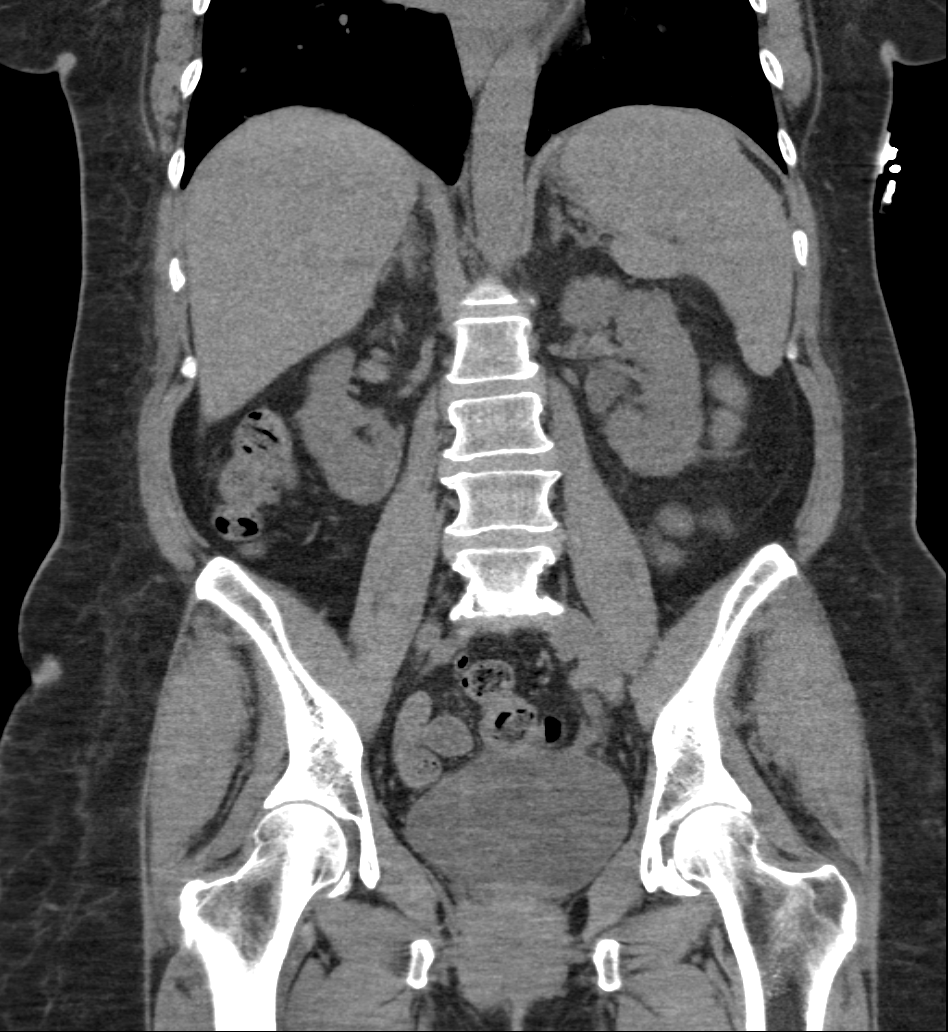

[11 of 46 positions shown; findings below may reference images not displayed]

FINDINGS: Lower chest: Lung bases are clear.  No pleural or pericardial fluid.

Hepatobiliary: Normal without contrast.  No calcified gallstones.

Pancreas: Normal

Spleen: Normal

Adrenals/Urinary Tract: Adrenal glands are normal. Right kidney is
normal. No cyst, mass, stone or hydronephrosis. Left kidney shows
mild fullness of the renal collecting system an ureter. There is a 2
mm stone at the left UVJ. No stone in the bladder.

Stomach/Bowel: Normal

Vascular/Lymphatic: Normal

Reproductive: Previous hysterectomy.  No pelvic mass.

Other: No free fluid or air.

Musculoskeletal: Chronic lower lumbar degenerative changes.
IMPRESSION: 2 mm stone at the left UVJ with mild left hydronephrosis. No other
urinary tract stone identified.

## 2018-03-20 ENCOUNTER — Other Ambulatory Visit: Payer: Self-pay | Admitting: Interventional Cardiology

## 2018-04-10 ENCOUNTER — Other Ambulatory Visit: Payer: Self-pay | Admitting: Interventional Cardiology

## 2018-04-10 MED ORDER — BENAZEPRIL HCL 20 MG PO TABS: 20.0000 mg | ORAL_TABLET | Freq: Every day | ORAL | 0 refills | Status: DC

## 2018-04-10 NOTE — Telephone Encounter (Signed)
° ° ° °*  STAT* If patient is at the pharmacy, call can be transferred to refill team.   1. Which medications need to be refilled? (please list name of each medication and dose if known) benazepril (LOTENSIN) 20 MG tablet  2. Which pharmacy/location (including street and city if local pharmacy) is medication to be sent to?[id number] [place] [place] - [address] AT [address]  3. Do they need a 30 day or 90 day supply? [place]

## 2018-04-10 NOTE — Telephone Encounter (Signed)
Pt's medication was sent to pt's pharmacy as requested. Confirmation received.  °

## 2018-05-08 NOTE — Progress Notes (Signed)
Cardiology Office Note    Date:  05/09/2018   ID:  Briana Alvarez, DOB Nov 10, 1960, MRN [id number]  PCP:  Vicenta Aly, [place]  Cardiologist: Larae Grooms, MD EPS: [name], MD  No chief complaint on file.   History of Present Illness:  Briana Alvarez is a 57 y.o. 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  Had syncope in the early 2000's when living in [place] and had a normal cardiac catheterization at that time.  Negative stress test 2015.  Last saw Dr. Irish Lack 07/2016 which time he increased her benazepril for hypertension.  Followed closely by Dr. Rayann Heman for bigeminy.  Last seen 08/2017 and repeat ablation offered but patient declined.  Patient comes in for regular f/u. Had two days last week which she was short of breath and had to keep taking deep breaths. Couldn't feel any palpitations.  Increased fatigue.  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  Most recent one is 2 months old which could be contributing to her fatigue.  She denies chest pain, palpitations, dizziness or presyncope.    Past Medical History:  Diagnosis Date  . Anxiety   . Arthritis   . Depression   . Diabetes mellitus   . Headache    "2 a month"  . Hypertension   . Migraine   . Neuropathy   . Pneumonia     Past Surgical History:  Procedure Laterality Date  . ABDOMINAL HYSTERECTOMY    . BREAST EXCISIONAL BIOPSY  2001   LEFT BREAST  . CARPAL TUNNEL RELEASE    . KNEE ARTHROSCOPY Right   . KNEE SURGERY Left   . NECK SURGERY    . SHOULDER ARTHROSCOPY    . SHOULDER ARTHROSCOPY WITH SUBACROMIAL DECOMPRESSION AND BICEP TENDON REPAIR Right 02/15/2016   Procedure: SHOULDER DIAGNOSTIC OPERATIVE ARTHROSCOPY WITH SUBACROMIAL DECOMPRESSION AND DISTAL CLAVICLE EXCISION;  Surgeon: Meredith Pel, MD;  Location: [facility];  Service: Orthopedics;  Laterality: Right;  . TONSILLECTOMY    . ulner nerve     . V TACH ABLATION N/A 09/27/2016   Procedure: V  Tach Ablation;  Surgeon: [name], MD;  Location: [facility];  Service: Cardiovascular;  Laterality: N/A;    Current Medications: Current Meds  Medication Sig  . acetaminophen (TYLENOL) 500 MG tablet Take 1,000 mg by mouth every 6 (six) hours as needed for headache (pain).  Marland Kitchen albuterol (PROVENTIL HFA;VENTOLIN HFA) 108 (90 BASE) MCG/ACT inhaler Inhale 2 puffs into the lungs every 6 (six) hours as needed for wheezing or shortness of breath.  . benazepril (LOTENSIN) 20 MG tablet Take 1 tablet (20 mg total) by mouth daily.  Marland Kitchen BYETTA 5 MCG PEN 5 MCG/0.02ML SOPN injection Inject 5 mcg as directed 2 (two) times daily before a meal.  . gabapentin (NEURONTIN) 300 MG capsule TAKE 3 CAPSULES BY MOUTH 3 TIMES A DAY  . hydrochlorothiazide (HYDRODIURIL) 25 MG tablet TAKE ONE TABLET (25 MG DOSE) BY MOUTH DAILY.  . metFORMIN (GLUCOPHAGE) 1000 MG tablet TAKE 1 TABLET BY MOUTH 2 TIMES DAILY WITH A MEAL.  . Multiple Vitamin (MULTIVITAMIN WITH MINERALS) TABS tablet Take 1 tablet by mouth daily. Women's One a Day 5 +  . NON FORMULARY [facility]  Peripheral Neuropathy Cream- Bupivacaine 1%, Doxepin 3%, Gabapentin 6%, Pentoxifylline 3%, Topiramate 1% Apply 1-2 grams to affected area 3-4 times daily Qty. 120 gm 3 refills  . rizatriptan (MAXALT-MLT) 10 MG disintegrating tablet  Take 1 tablet (10 mg total) by mouth as needed for migraine. May repeat in 2 hours if needed  . sertraline (ZOLOFT) 50 MG tablet Take 50 mg by mouth daily.  Marland Kitchen topiramate (TOPAMAX) 50 MG tablet Take 1 tablet (50 mg total) by mouth 2 (two) times daily.  . traZODone (DESYREL) 150 MG tablet Take 2 tablets (300 mg total) by mouth at bedtime.  . [DISCONTINUED] benazepril (LOTENSIN) 20 MG tablet Take 1 tablet (20 mg total) by mouth daily. Please keep upcoming appt in November for future refills. Thank you   Current Facility-Administered Medications for the 05/09/18 encounter (Office Visit) with Imogene Burn, PA-C  Medication   . betamethasone acetate-betamethasone sodium phosphate (CELESTONE) injection 3 mg  . betamethasone acetate-betamethasone sodium phosphate (CELESTONE) injection 3 mg  . betamethasone acetate-betamethasone sodium phosphate (CELESTONE) injection 3 mg  . betamethasone acetate-betamethasone sodium phosphate (CELESTONE) injection 3 mg     Allergies:   Shrimp [shellfish allergy]   Social History   Socioeconomic History  . Marital status: Divorced    Spouse name: Not on file  . Number of children: Not on file  . Years of education: Not on file  . Highest education level: Not on file  Occupational History  . Not on file  Social Needs  . Financial resource strain: Not on file  . Food insecurity:    Worry: Not on file    Inability: Not on file  . Transportation needs:    Medical: Not on file    Non-medical: Not on file  Tobacco Use  . Smoking status: Never Smoker  . Smokeless tobacco: Never Used  Substance and Sexual Activity  . Alcohol use: No  . Drug use: No  . Sexual activity: Not on file  Lifestyle  . Physical activity:    Days per week: Not on file    Minutes per session: Not on file  . Stress: Not on file  Relationships  . Social connections:    Talks on phone: Not on file    Gets together: Not on file    Attends religious service: Not on file    Active member of club or organization: Not on file    Attends meetings of clubs or organizations: Not on file    Relationship status: Not on file  Other Topics Concern  . Not on file  Social History Narrative  . Not on file     Family History:  The patient's family history includes Breast cancer in her mother; Cancer in her mother; Diabetes in her daughter and maternal grandmother; Hypertension in her maternal grandmother and mother.   ROS:   Please see the history of present illness.    Review of Systems  Constitution: Positive for malaise/fatigue.  HENT: Negative.   Cardiovascular: Negative.   Respiratory: Negative.    Endocrine: Negative.   Hematologic/Lymphatic: Bruises/bleeds easily.  Musculoskeletal: Positive for back pain.  Gastrointestinal: Negative.   Genitourinary: Negative.   Neurological: Positive for headaches.  Psychiatric/Behavioral: The patient is nervous/anxious.    All other systems reviewed and are negative.   PHYSICAL EXAM:   VS:  BP 126/84   Pulse 61   Ht 5' 6.5" (1.689 m)   Wt 198 lb 3.2 oz (89.9 kg)   LMP  (LMP Unknown)   SpO2 99%   BMI 31.51 kg/m   Physical Exam  GEN: Well nourished, well developed, in no acute distress  Neck: no JVD, carotid bruits, or masses Cardiac:RRR; no murmurs, rubs, or  gallops  Respiratory:  clear to auscultation bilaterally, normal work of breathing GI: soft, nontender, nondistended, + BS Ext: without cyanosis, clubbing, or edema, Good distal pulses bilaterally Neuro:  Alert and Oriented x 3 Psych: euthymic mood, full affect  Wt Readings from Last 3 Encounters:  05/09/18 198 lb 3.2 oz (89.9 kg)  08/23/17 196 lb (88.9 kg)  07/31/17 189 lb (85.7 kg)      Studies/Labs Reviewed:   EKG:  EKG is not ordered today.    Recent Labs: No results found for requested labs within last 8760 hours.   Lipid Panel    Component Value Date/Time   CHOL 131 06/16/2014 0938   TRIG 197 (H) 06/16/2014 0938   HDL 34 (L) 06/16/2014 0938   CHOLHDL 3.9 06/16/2014 0938   VLDL 39 06/16/2014 0938   LDLCALC 58 06/16/2014 0938    Additional studies/ records that were reviewed today include:  2D echo 06/30/2016  Study Conclusions   - Left ventricle: The cavity size was normal. Wall thickness was   normal. Systolic function was normal. The estimated ejection   fraction was in the range of 55% to 60%. Wall motion was normal;   there were no regional wall motion abnormalities. - Aortic valve: There was trivial regurgitation. - Right atrium: The atrium was mildly dilated. - Pulmonary arteries: Systolic pressure was mildly to moderately   increased. PA peak  pressure: 40 mm Hg (S).   Stress echo 11/92015 Baseline:  - The estimated LV ejection fraction was 55-60%. - Normal wall motion; no LV regional wall motion abnormalities.  Peak stress:  - The estimated LV ejection fraction was 70-75%. - Normal wall motion; no LV regional wall motion abnormalities. Study Conclusions  - Stress ECG conclusions: There were no stress arrhythmias or   conduction abnormalities. The stress ECG was normal. - Staged echo: Low/ intermediate risk stress echocardiogram   secondary to poor exercise tolerance (3:58). No ECG or ischemic   changes on echocardiogram       ASSESSMENT:    1. Essential hypertension   2. Ventricular bigeminy   3. Hypertriglyceridemia      PLAN:  In order of problems listed above:  Essential hypertension benazepril increased last office visit and blood pressure stable.  Increased fatigue suspect secondary to her caring for 101-month-old and 36-month-old as well as 2 teenage children of her sons.  Will check surveillance labs.  Follow-up with Dr. Irish Lack in 6 months.  Ventricular bigeminy S/P failed ablation followed by Dr. Rayann Heman asymptomatic for the most part.  Did have 2 days of shortness of breath last week relieved with taking a deep breath.  Could be related to bigeminy.  She does not feel palpitations.  Hypertriglyceridemia she is not fasting today so cannot check these labs.  Medication Adjustments/Labs and Tests Ordered: Current medicines are reviewed at length with the patient today.  Concerns regarding medicines are outlined above.  Medication changes, Labs and Tests ordered today are listed in the Patient Instructions below. Patient Instructions  Medication Instructions:  Your physician recommends that you continue on your current medications as directed. Please refer to the Current Medication list given to you today.  If you need a refill on your cardiac medications before your next appointment, please call your  pharmacy.   Lab work: None Ordered  If you have labs (blood work) drawn today and your tests are completely normal, you will receive your results only by: Marland Kitchen MyChart Message (if you have MyChart) OR .  A paper copy in the mail If you have any lab test that is abnormal or we need to change your treatment, we will call you to review the results.  Testing/Procedures: None ordered  Follow-Up: At [facility], you and your health needs are our priority.  As part of our continuing mission to provide you with exceptional heart care, we have created designated Provider Care Teams.  These Care Teams include your primary Cardiologist (physician) and Advanced Practice Providers (APPs -  Physician Assistants and Nurse Practitioners) who all work together to provide you with the care you need, when you need it. . You will need a follow up appointment in 6 months.  Please call our office 2 months in advance to schedule this appointment.  You may see Casandra Doffing, MD or one of the following Advanced Practice Providers on your designated Care Team:   . Lyda Jester, PA-C . Dayna Dunn, PA-C . Ermalinda Barrios, PA-C  Any Other Special Instructions Will Be Listed Below (If Applicable).       Sumner Boast, PA-C  05/09/2018 9:52 AM    [facility] [address], [place], [place]  [address] Phone: [phone]; Fax: [phone]

## 2018-05-09 ENCOUNTER — Encounter: Payer: Self-pay | Admitting: Physician Assistant

## 2018-05-09 ENCOUNTER — Ambulatory Visit (INDEPENDENT_AMBULATORY_CARE_PROVIDER_SITE_OTHER): Payer: Medicaid Other | Admitting: Physician Assistant

## 2018-05-09 VITALS — BP 126/84 | HR 61 | Ht 66.5 in | Wt 198.2 lb

## 2018-05-09 DIAGNOSIS — I499 Cardiac arrhythmia, unspecified: Secondary | ICD-10-CM

## 2018-05-09 DIAGNOSIS — I10 Essential (primary) hypertension: Secondary | ICD-10-CM

## 2018-05-09 DIAGNOSIS — E781 Pure hyperglyceridemia: Secondary | ICD-10-CM

## 2018-05-09 DIAGNOSIS — I498 Other specified cardiac arrhythmias: Secondary | ICD-10-CM

## 2018-05-09 LAB — COMPREHENSIVE METABOLIC PANEL
A/G RATIO: 2.2 (ref 1.2–2.2)
ALT: 12 IU/L (ref 0–32)
AST: 20 IU/L (ref 0–40)
Albumin: 4 g/dL (ref 3.5–5.5)
Alkaline Phosphatase: 110 IU/L (ref 39–117)
BILIRUBIN TOTAL: 0.5 mg/dL (ref 0.0–1.2)
BUN/Creatinine Ratio: 22 (ref 9–23)
BUN: 24 mg/dL (ref 6–24)
CALCIUM: 9.4 mg/dL (ref 8.7–10.2)
CHLORIDE: 99 mmol/L (ref 96–106)
CO2: 25 mmol/L (ref 20–29)
Creatinine, Ser: 1.08 mg/dL — ABNORMAL HIGH (ref 0.57–1.00)
GFR calc Af Amer: 65 mL/min/{1.73_m2} (ref >59)
GFR, EST NON AFRICAN AMERICAN: 57 mL/min/{1.73_m2} — AB (ref >59)
GLOBULIN, TOTAL: 1.8 g/dL (ref 1.5–4.5)
Glucose: 156 mg/dL — ABNORMAL HIGH (ref 65–99)
POTASSIUM: 4.3 mmol/L (ref 3.5–5.2)
Sodium: 138 mmol/L (ref 134–144)
Total Protein: 5.8 g/dL — ABNORMAL LOW (ref 6.0–8.5)

## 2018-05-09 LAB — CBC
HEMATOCRIT: 36.4 % (ref 34.0–46.6)
Hemoglobin: 12.1 g/dL (ref 11.1–15.9)
MCH: 31.5 pg (ref 26.6–33.0)
MCHC: 33.2 g/dL (ref 31.5–35.7)
MCV: 95 fL (ref 79–97)
PLATELETS: 148 10*3/uL — AB (ref 150–450)
RBC: 3.84 x10E6/uL (ref 3.77–5.28)
RDW: 12.2 % — AB (ref 12.3–15.4)
WBC: 6.1 10*3/uL (ref 3.4–10.8)

## 2018-05-09 LAB — TSH: TSH: 2.24 u[IU]/mL (ref 0.450–4.500)

## 2018-05-09 MED ORDER — BENAZEPRIL HCL 20 MG PO TABS: 20.0000 mg | ORAL_TABLET | Freq: Every day | ORAL | 3 refills | Status: DC

## 2018-05-09 NOTE — Patient Instructions (Addendum)
Medication Instructions:  Your physician recommends that you continue on your current medications as directed. Please refer to the Current Medication list given to you today.  If you need a refill on your cardiac medications before your next appointment, please call your pharmacy.   Lab work: TODAY: CMET, CBC, TSH  If you have labs (blood work) drawn today and your tests are completely normal, you will receive your results only by: Marland Kitchen MyChart Message (if you have MyChart) OR . A paper copy in the mail If you have any lab test that is abnormal or we need to change your treatment, we will call you to review the results.  Testing/Procedures: None ordered  Follow-Up: At [facility], you and your health needs are our priority.  As part of our continuing mission to provide you with exceptional heart care, we have created designated Provider Care Teams.  These Care Teams include your primary Cardiologist (physician) and Advanced Practice Providers (APPs -  Physician Assistants and Nurse Practitioners) who all work together to provide you with the care you need, when you need it. . You will need a follow up appointment in 6 months.  Please call our office 2 months in advance to schedule this appointment.  You may see Casandra Doffing, MD or one of the following Advanced Practice Providers on your designated Care Team:   . Lyda Jester, PA-C . Dayna Dunn, PA-C . Ermalinda Barrios, PA-C  Any Other Special Instructions Will Be Listed Below (If Applicable).

## 2018-05-14 ENCOUNTER — Ambulatory Visit (INDEPENDENT_AMBULATORY_CARE_PROVIDER_SITE_OTHER): Payer: Medicaid Other | Admitting: Specialist

## 2018-08-15 ENCOUNTER — Other Ambulatory Visit: Payer: Self-pay | Admitting: Nurse Practitioner

## 2018-08-15 DIAGNOSIS — Z1231 Encounter for screening mammogram for malignant neoplasm of breast: Secondary | ICD-10-CM

## 2018-08-16 IMAGING — CT CT HEAD W/O CM
4 series · 16 of 47 positions shown, 18 images · non-contrast
Comparison: MRI head 10/24/2015

CLINICAL DATA: Headache

EXAM:
CT HEAD WITHOUT CONTRAST
TECHNIQUE: Contiguous axial images were obtained from the base of the skull
through the vertex without intravenous contrast.

[Series 3: head wo · axial · 0.41mm/px · z∈[-78,+32]mm · 7 of 30 slices shown, 9 images]
[im 4/30  brain]
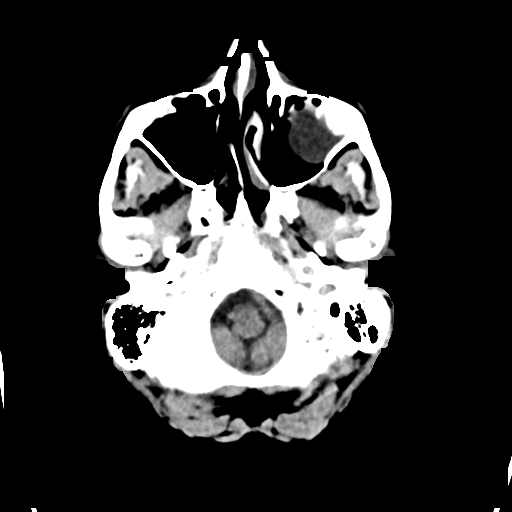
[im 4/30  bone]
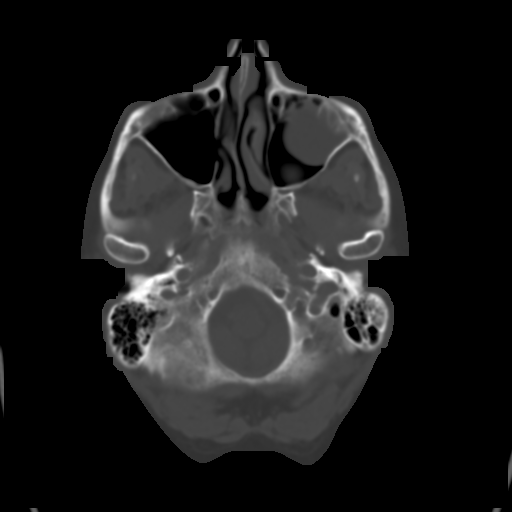
[im 8/30  brain]
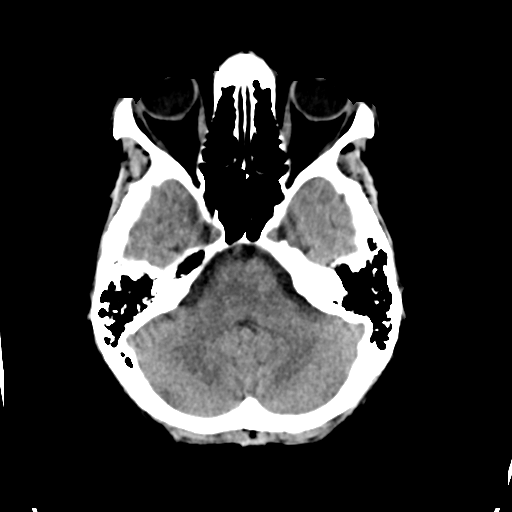
[im 11/30  brain]
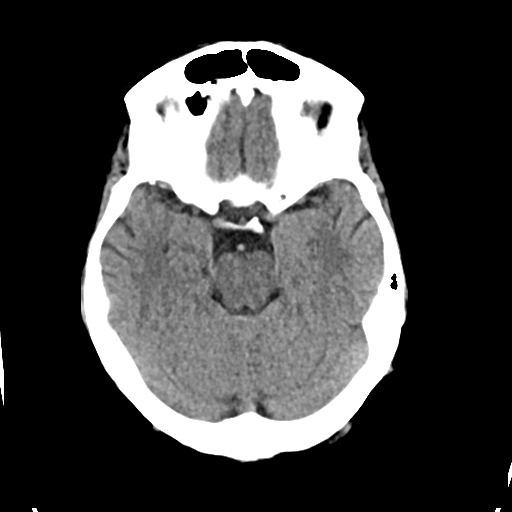
[im 15/30  brain]
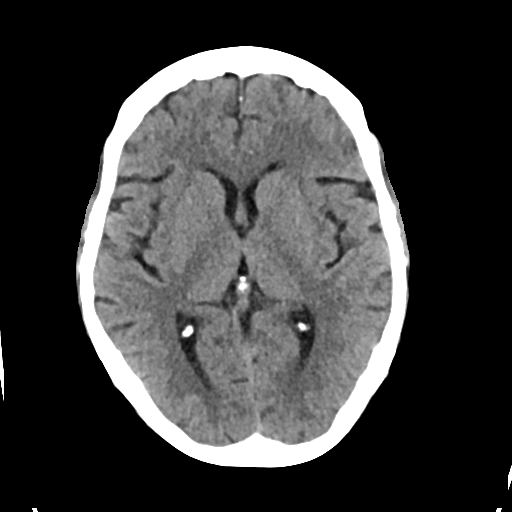
[im 19/30  brain]
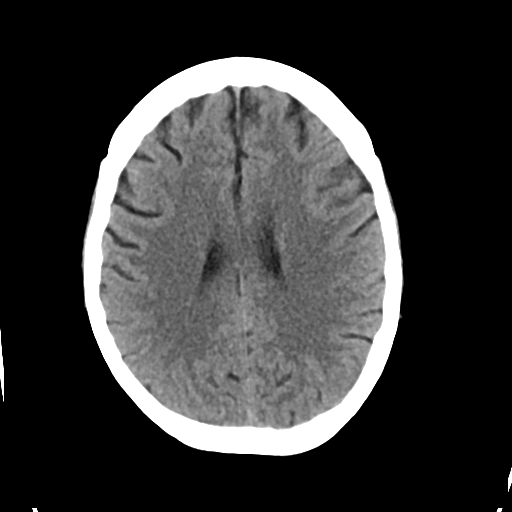
[im 19/30  bone]
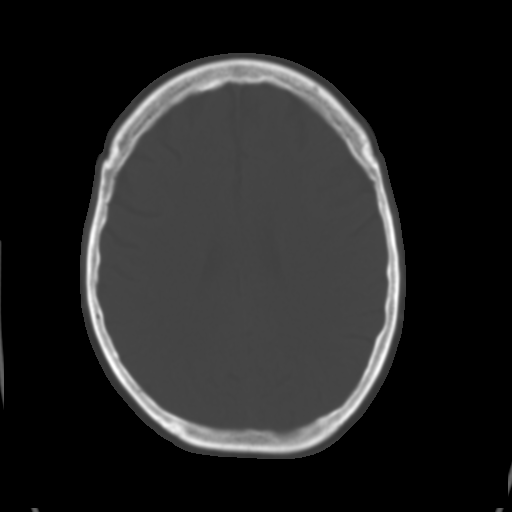
[im 22/30  brain]
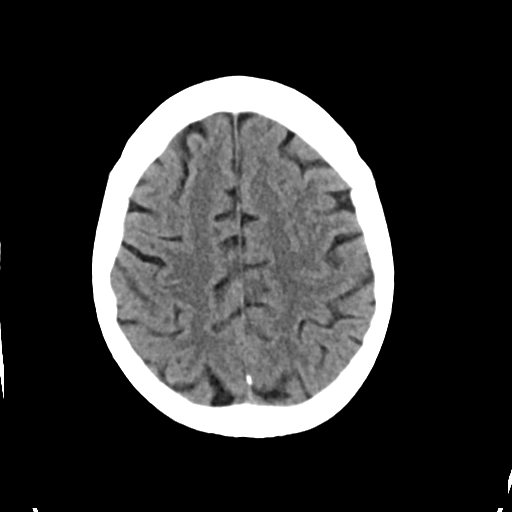
[im 26/30  brain]
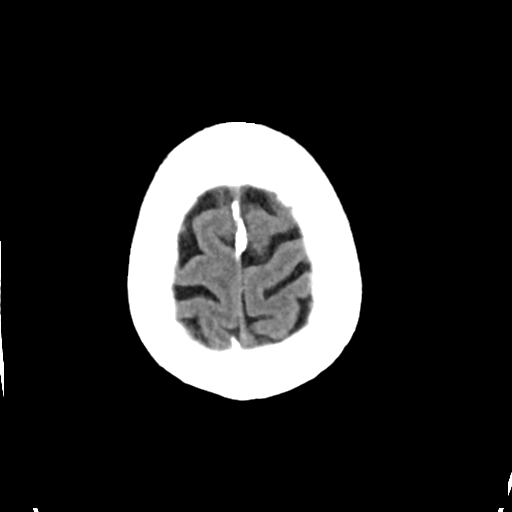

[Series 4: head bone · axial · 0.41mm/px · z∈[-79,-51]mm · 3 of 74 slices shown]
[im 8/74  bone]
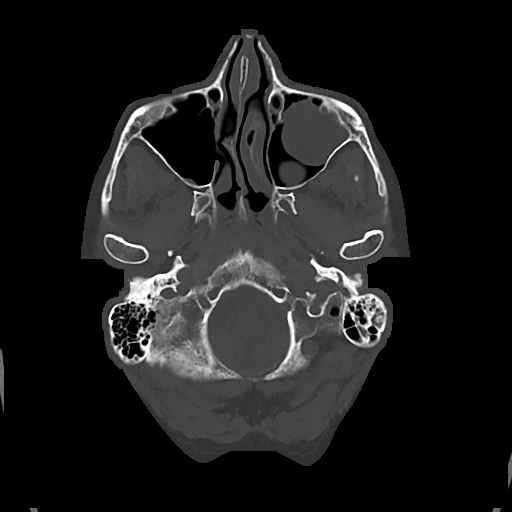
[im 15/74  bone]
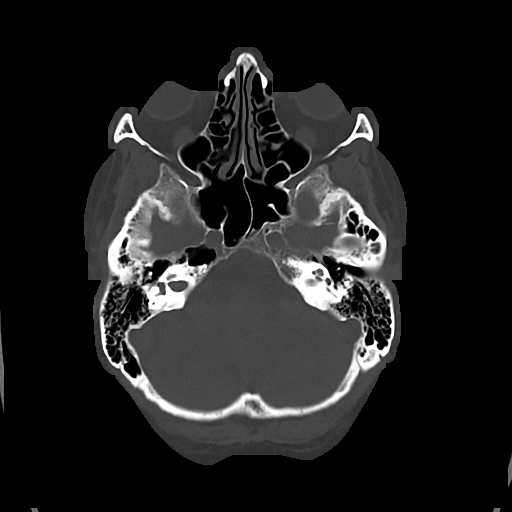
[im 22/74  bone]
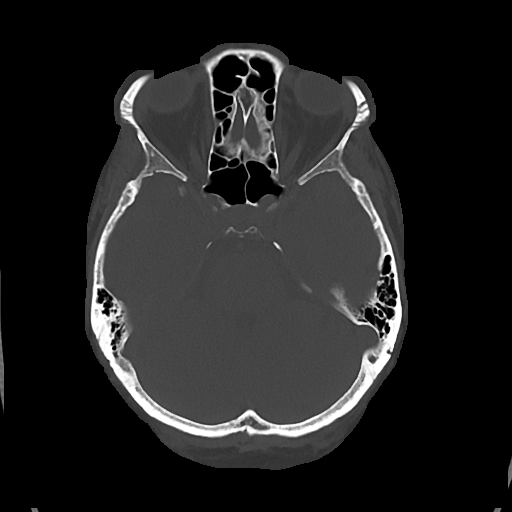

[Series 5: cor soft · coronal · 0.29mm/px · 3 of 59 slices shown]
[im 20/59  brain]
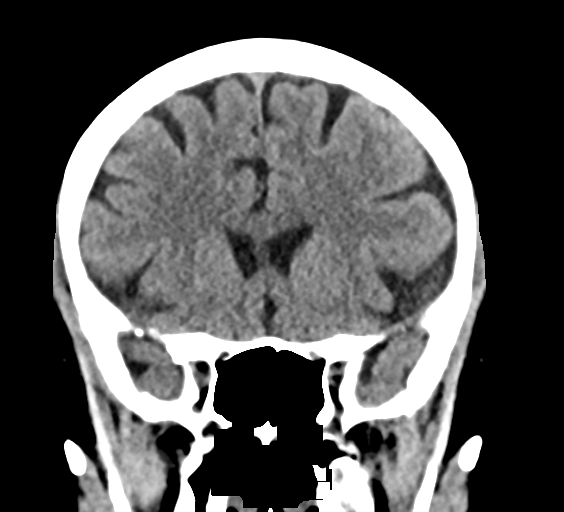
[im 26/59  brain]
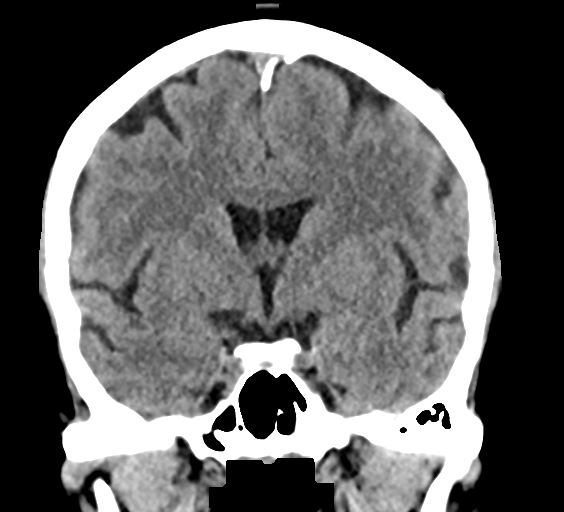
[im 33/59  brain]
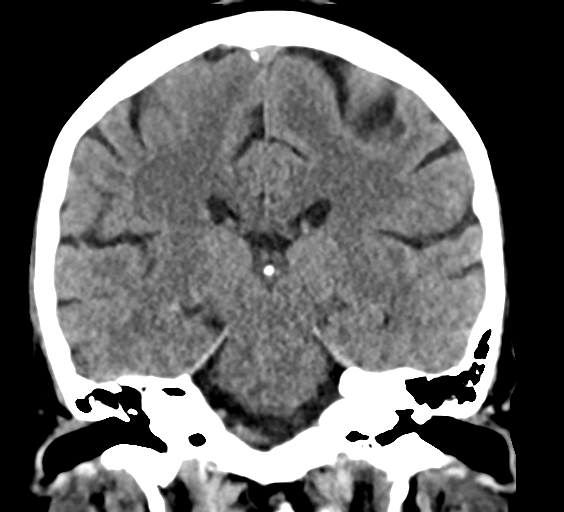

[Series 6: sag soft · sagittal · 0.29mm/px · 3 of 47 slices shown]
[im 16/47  brain]
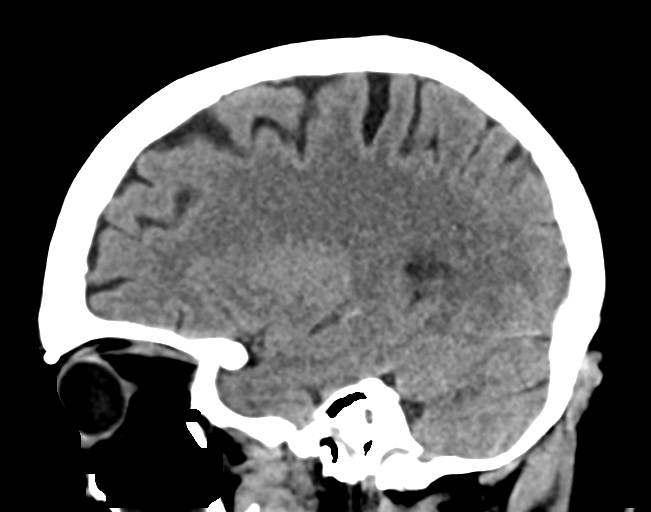
[im 24/47  brain]
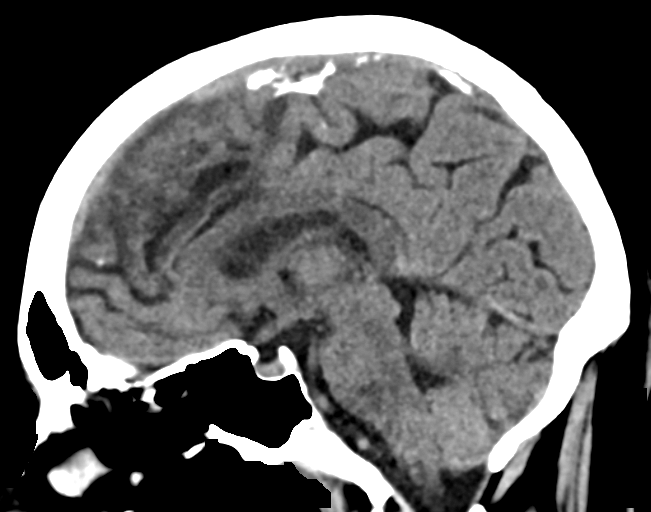
[im 31/47  brain]
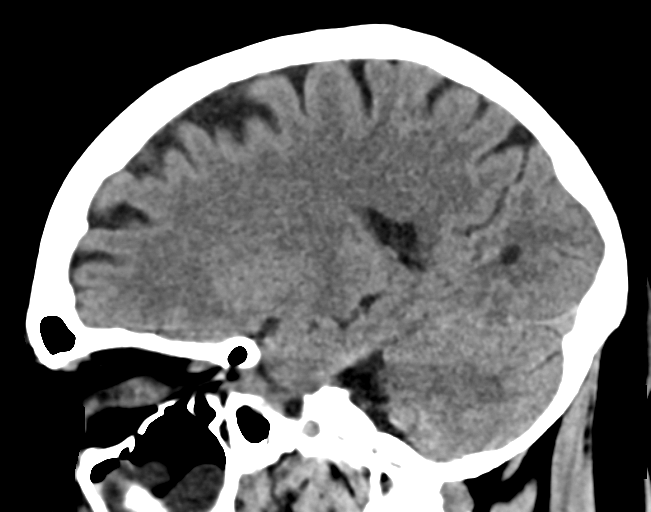

[16 of 47 positions shown; findings below may reference images not displayed]

FINDINGS: Brain: No evidence of acute infarction, hemorrhage, hydrocephalus,
extra-axial collection or mass lesion/mass effect.

Vascular: No hyperdense vessel or unexpected calcification.

Skull: Negative

Sinuses/Orbits: Mucosal edema in the paranasal sinuses with large
retention cysts bilaterally left greater than right. Negative orbit

Other: None
IMPRESSION: Negative CT head

Sinus mucosal disease

## 2018-09-24 ENCOUNTER — Ambulatory Visit: Payer: Medicaid Other

## 2018-10-01 ENCOUNTER — Telehealth: Payer: Self-pay

## 2018-10-01 NOTE — Telephone Encounter (Signed)
Virtual Visit Pre-Appointment Phone Call  TELEPHONE CALL NOTE  Briana Alvarez has been deemed a candidate for a follow-up tele-health visit to limit community exposure during the Covid-19 pandemic. I spoke with the patient via phone to ensure availability of phone/video source, confirm preferred email & phone number, and discuss instructions and expectations.  I reminded Briana Alvarez to be prepared with any vital sign and/or heart rhythm information that could potentially be obtained via home monitoring, at the time of her visit. I reminded Briana Alvarez to expect a phone call at the time of her visit if her visit.  Did the patient verbally acknowledge consent to treatment? YES  Patient agreed to VIDEO Visit via [email or web address] with Dr. Irish Lack  Cleon Gustin, RN 10/01/2018 3:46 PM   CONSENT FOR TELE-HEALTH VISIT - PLEASE REVIEW  I hereby voluntarily request, consent and authorize [facility] and its employed or contracted physicians, physician assistants, nurse practitioners or other licensed health care professionals (the Practitioner), to provide me with telemedicine health care services (the "Services") as deemed necessary by the treating Practitioner. I acknowledge and consent to receive the Services by the Practitioner via telemedicine. I understand that the telemedicine visit will involve communicating with the Practitioner through live audiovisual communication technology and the disclosure of certain medical information by electronic transmission. I acknowledge that I have been given the opportunity to request an in-person assessment or other available alternative prior to the telemedicine visit and am voluntarily participating in the telemedicine visit.  I understand that I have the right to withhold or withdraw my consent to the use of telemedicine in the course of my care at any time, without affecting my right to future care or treatment, and that the Practitioner or I may terminate  the telemedicine visit at any time. I understand that I have the right to inspect all information obtained and/or recorded in the course of the telemedicine visit and may receive copies of available information for a reasonable fee.  I understand that some of the potential risks of receiving the Services via telemedicine include:  Marland Kitchen Delay or interruption in medical evaluation due to technological equipment failure or disruption; . Information transmitted may not be sufficient (e.g. poor resolution of images) to allow for appropriate medical decision making by the Practitioner; and/or  . In rare instances, security protocols could fail, causing a breach of personal health information.  Furthermore, I acknowledge that it is my responsibility to provide information about my medical history, conditions and care that is complete and accurate to the best of my ability. I acknowledge that Practitioner's advice, recommendations, and/or decision may be based on factors not within their control, such as incomplete or inaccurate data provided by me or distortions of diagnostic images or specimens that may result from electronic transmissions. I understand that the practice of medicine is not an exact science and that Practitioner makes no warranties or guarantees regarding treatment outcomes. I acknowledge that I will receive a copy of this consent concurrently upon execution via email to the email address I last provided but may also request a printed copy by calling the office of [facility].    I understand that my insurance will be billed for this visit.   I have read or had this consent read to me. . I understand the contents of this consent, which adequately explains the benefits and risks of the Services being provided via telemedicine.  . I have been provided ample opportunity to ask questions  regarding this consent and the Services and have had my questions answered to my satisfaction. . I give my  informed consent for the services to be provided through the use of telemedicine in my medical care  By participating in this telemedicine visit I agree to the above.

## 2018-10-03 NOTE — Progress Notes (Signed)
Virtual Visit via Video Note   This visit type was conducted due to national recommendations for restrictions regarding the COVID-19 Pandemic (e.g. social distancing) in an effort to limit this patient's exposure and mitigate transmission in our community.  Due to her co-morbid illnesses, this patient is at least at moderate risk for complications without adequate follow up.  This format is felt to be most appropriate for this patient at this time.  All issues noted in this document were discussed and addressed.  A limited physical exam was performed with this format.  Please refer to the patient's chart for her consent to telehealth for [facility].   Evaluation Performed:  Follow-up visit  Date:  10/04/2018   ID:  Briana Alvarez, DOB Jul 02, 1960, MRN [id number]  Patient Location: Other:  work Provider Location: Home  PCP:  Vicenta Aly, [place]  Cardiologist:  Larae Grooms, MD  Electrophysiologist:  [name], MD   Chief Complaint:  HTN/PVCs  History of Present Illness:    Briana Alvarez is a 58 y.o. female with HTN, PVCs, s/p ablation attempt in 4/18, hyperlipidemia and DM.  She was 45 when diagnosed with HTN. She has been taking medicine or many years. She lived in [place] and had syncope in the early 2000s, followed by a clean cath at that time due to an abnormal ECG. She was having chest pressure at that time.   More recently, She had chest pain in 10/15. She went to the ER and had a negative w/u and was sent home. Since that time, no recurrent sx. She had a negative stress in 11/15.   Exercise has been limited by DM neuropathy in the past.   Saw EP again in 2019 when repeat ablation was offered but declined by patient.   The patient does not have symptoms concerning for COVID-19 infection (fever, chills, cough, or new shortness of breath).   Denies : Chest pain. Dizziness. Leg edema. Nitroglycerin use. Orthopnea. Palpitations. Paroxysmal nocturnal dyspnea.  Shortness of breath. Syncope.   Stress level dropped when she distanced from her son.  She had to adopt his kids.  She cares for his 4 kids.     Past Medical History:  Diagnosis Date   Anxiety    Arthritis    Depression    Diabetes mellitus    Headache    "2 a month"   Hypertension    Migraine    Neuropathy    Pneumonia    Past Surgical History:  Procedure Laterality Date   ABDOMINAL HYSTERECTOMY     BREAST EXCISIONAL BIOPSY  2001   LEFT BREAST   CARPAL TUNNEL RELEASE     KNEE ARTHROSCOPY Right    KNEE SURGERY Left    NECK SURGERY     SHOULDER ARTHROSCOPY     SHOULDER ARTHROSCOPY WITH SUBACROMIAL DECOMPRESSION AND BICEP TENDON REPAIR Right 02/15/2016   Procedure: SHOULDER DIAGNOSTIC OPERATIVE ARTHROSCOPY WITH SUBACROMIAL DECOMPRESSION AND DISTAL CLAVICLE EXCISION;  Surgeon: Meredith Pel, MD;  Location: [facility];  Service: Orthopedics;  Laterality: Right;   TONSILLECTOMY     ulner nerve      V TACH ABLATION N/A 09/27/2016   Procedure: V Tach Ablation;  Surgeon: [name], MD;  Location: [facility];  Service: Cardiovascular;  Laterality: N/A;     Current Meds  Medication Sig   acetaminophen (TYLENOL) 500 MG tablet Take 1,000 mg by mouth every 6 (six) hours as needed for headache (pain).   albuterol (PROVENTIL HFA;VENTOLIN HFA)  108 (90 BASE) MCG/ACT inhaler Inhale 2 puffs into the lungs every 6 (six) hours as needed for wheezing or shortness of breath.   benazepril (LOTENSIN) 20 MG tablet Take 1 tablet (20 mg total) by mouth daily.   BYETTA 5 MCG PEN 5 MCG/0.02ML SOPN injection Inject 5 mcg as directed 2 (two) times daily before a meal.   gabapentin (NEURONTIN) 300 MG capsule TAKE 3 CAPSULES BY MOUTH 3 TIMES A DAY   hydrochlorothiazide (HYDRODIURIL) 25 MG tablet TAKE ONE TABLET (25 MG DOSE) BY MOUTH DAILY.   metFORMIN (GLUCOPHAGE) 1000 MG tablet TAKE 1 TABLET BY MOUTH 2 TIMES DAILY WITH A MEAL.   Multiple Vitamin (MULTIVITAMIN WITH  MINERALS) TABS tablet Take 1 tablet by mouth daily. Women's One a Day 36 +   NON FORMULARY [facility]  Peripheral Neuropathy Cream- Bupivacaine 1%, Doxepin 3%, Gabapentin 6%, Pentoxifylline 3%, Topiramate 1% Apply 1-2 grams to affected area 3-4 times daily Qty. 120 gm 3 refills   rizatriptan (MAXALT-MLT) 10 MG disintegrating tablet Take 1 tablet (10 mg total) by mouth as needed for migraine. May repeat in 2 hours if needed   sertraline (ZOLOFT) 50 MG tablet Take 50 mg by mouth daily.   topiramate (TOPAMAX) 50 MG tablet Take 1 tablet (50 mg total) by mouth 2 (two) times daily.   traZODone (DESYREL) 150 MG tablet Take 2 tablets (300 mg total) by mouth at bedtime.   Current Facility-Administered Medications for the 10/04/18 encounter (Telemedicine) with Jettie Booze, MD  Medication   betamethasone acetate-betamethasone sodium phosphate (CELESTONE) injection 3 mg   betamethasone acetate-betamethasone sodium phosphate (CELESTONE) injection 3 mg   betamethasone acetate-betamethasone sodium phosphate (CELESTONE) injection 3 mg   betamethasone acetate-betamethasone sodium phosphate (CELESTONE) injection 3 mg     Allergies:   Shrimp [shellfish allergy]   Social History   Tobacco Use   Smoking status: Never Smoker   Smokeless tobacco: Never Used  Substance Use Topics   Alcohol use: No   Drug use: No     Family Hx: The patient's family history includes Breast cancer in her mother; Cancer in her mother; Diabetes in her daughter and maternal grandmother; Hypertension in her maternal grandmother and mother. There is no history of Heart attack or Stroke.  ROS:   Please see the history of present illness.    BP controlled at [place]- 134/60 All other systems reviewed and are negative.   Prior CV studies:   The following studies were reviewed today:    Labs/Other Tests and Data Reviewed:    EKG:  Bigeminy in 2019  Recent Labs: 05/09/2018: ALT 12; BUN 24;  Creatinine, Ser 1.08; Hemoglobin 12.1; Platelets 148; Potassium 4.3; Sodium 138; TSH 2.240   Recent Lipid Panel Lab Results  Component Value Date/Time   CHOL 131 06/16/2014 09:38 AM   TRIG 197 (H) 06/16/2014 09:38 AM   HDL 34 (L) 06/16/2014 09:38 AM   CHOLHDL 3.9 06/16/2014 09:38 AM   LDLCALC 58 06/16/2014 09:38 AM    Wt Readings from Last 3 Encounters:  10/04/18 198 lb (89.8 kg)  05/09/18 198 lb 3.2 oz (89.9 kg)  08/23/17 196 lb (88.9 kg)     Objective:    Vital Signs:  Ht 5' 6.5" (1.689 m)    Wt 198 lb (89.8 kg)    LMP  (LMP Unknown)    BMI 31.48 kg/m    Well nourished, well developed female in no acute distress. No apparent shortness of breath  ASSESSMENT & PLAN:  1. PVCs: No sx.  COntinue current meds.  2. HTN: The current medical regimen is effective;  continue present plan and medications.  Not limiting salt.   3. DM: A1C 7 per her report.  Follows with Dr.  Ouida Sills.   4. Needs albuterol refilled.  Will do x 1.  She has wheezing with exercise and some DOE. She has been using albuterol for some time.    COVID-19 Education: The signs and symptoms of COVID-19 were discussed with the patient and how to seek care for testing (follow up with PCP or arrange E-visit).  The importance of social distancing was discussed today.  Time:   Today, I have spent 20  minutes with the patient with telehealth technology discussing the above problems.     Medication Adjustments/Labs and Tests Ordered: Current medicines are reviewed at length with the patient today.  Concerns regarding medicines are outlined above.   Tests Ordered: No orders of the defined types were placed in this encounter.   Medication Changes: No orders of the defined types were placed in this encounter.   Disposition:  Follow up in 1 year(s)  Signed, Larae Grooms, MD  10/04/2018 3:18 PM    [place]

## 2018-10-04 ENCOUNTER — Other Ambulatory Visit: Payer: Self-pay

## 2018-10-04 ENCOUNTER — Encounter: Payer: Self-pay | Admitting: Interventional Cardiology

## 2018-10-04 ENCOUNTER — Telehealth (INDEPENDENT_AMBULATORY_CARE_PROVIDER_SITE_OTHER): Payer: Medicaid Other | Admitting: Interventional Cardiology

## 2018-10-04 VITALS — Ht 66.5 in | Wt 198.0 lb

## 2018-10-04 DIAGNOSIS — E119 Type 2 diabetes mellitus without complications: Secondary | ICD-10-CM | POA: Diagnosis not present

## 2018-10-04 DIAGNOSIS — I493 Ventricular premature depolarization: Secondary | ICD-10-CM

## 2018-10-04 DIAGNOSIS — I499 Cardiac arrhythmia, unspecified: Secondary | ICD-10-CM

## 2018-10-04 DIAGNOSIS — I10 Essential (primary) hypertension: Secondary | ICD-10-CM

## 2018-10-04 DIAGNOSIS — R062 Wheezing: Secondary | ICD-10-CM | POA: Diagnosis not present

## 2018-10-04 DIAGNOSIS — I498 Other specified cardiac arrhythmias: Secondary | ICD-10-CM

## 2018-10-04 MED ORDER — ALBUTEROL SULFATE HFA 108 (90 BASE) MCG/ACT IN AERS: 2.0000 | INHALATION_SPRAY | Freq: Four times a day (QID) | RESPIRATORY_TRACT | 0 refills | Status: AC | PRN

## 2018-10-04 NOTE — Patient Instructions (Signed)

## 2018-11-05 ENCOUNTER — Ambulatory Visit: Payer: Medicaid Other

## 2018-11-07 ENCOUNTER — Ambulatory Visit: Payer: Medicaid Other | Admitting: Interventional Cardiology

## 2019-01-11 ENCOUNTER — Ambulatory Visit: Payer: Medicaid Other

## 2019-01-23 ENCOUNTER — Other Ambulatory Visit: Payer: Self-pay

## 2019-01-23 DIAGNOSIS — Z20822 Contact with and (suspected) exposure to covid-19: Secondary | ICD-10-CM

## 2019-01-24 LAB — NOVEL CORONAVIRUS, NAA: SARS-CoV-2, NAA: NOT DETECTED

## 2019-01-25 ENCOUNTER — Emergency Department (HOSPITAL_COMMUNITY)
Admission: EM | Admit: 2019-01-25 | Discharge: 2019-01-25 | Disposition: A | Discharge status: Home / Self Care | Payer: Medicaid Other | Attending: Emergency Medicine | Admitting: Emergency Medicine

## 2019-01-25 ENCOUNTER — Encounter (HOSPITAL_COMMUNITY): Payer: Self-pay | Admitting: Emergency Medicine

## 2019-01-25 ENCOUNTER — Other Ambulatory Visit: Payer: Self-pay

## 2019-01-25 DIAGNOSIS — R079 Chest pain, unspecified: Secondary | ICD-10-CM | POA: Insufficient documentation

## 2019-01-25 DIAGNOSIS — Z5321 Procedure and treatment not carried out due to patient leaving prior to being seen by health care provider: Secondary | ICD-10-CM | POA: Diagnosis not present

## 2019-01-25 LAB — BASIC METABOLIC PANEL
Anion gap: 10 (ref 5–15)
BUN: 20 mg/dL (ref 6–20)
CO2: 25 mmol/L (ref 22–32)
Calcium: 9.4 mg/dL (ref 8.9–10.3)
Chloride: 102 mmol/L (ref 98–111)
Creatinine, Ser: 1.07 mg/dL — ABNORMAL HIGH (ref 0.44–1.00)
GFR calc Af Amer: >60 mL/min (ref >60)
GFR calc non Af Amer: 57 mL/min — ABNORMAL LOW (ref >60)
Glucose, Bld: 97 mg/dL (ref 70–99)
Potassium: 3.8 mmol/L (ref 3.5–5.1)
Sodium: 137 mmol/L (ref 135–145)

## 2019-01-25 LAB — CBC
HCT: 40.2 % (ref 36.0–46.0)
Hemoglobin: 13 g/dL (ref 12.0–15.0)
MCH: 32.4 pg (ref 26.0–34.0)
MCHC: 32.3 g/dL (ref 30.0–36.0)
MCV: 100.2 fL — ABNORMAL HIGH (ref 80.0–100.0)
Platelets: 168 10*3/uL (ref 150–400)
RBC: 4.01 MIL/uL (ref 3.87–5.11)
RDW: 14.4 % (ref 11.5–15.5)
WBC: 7.2 10*3/uL (ref 4.0–10.5)
nRBC: 0 % (ref 0.0–0.2)

## 2019-01-25 LAB — TROPONIN I (HIGH SENSITIVITY): Troponin I (High Sensitivity): <2 ng/L (ref <18)

## 2019-01-25 MED ORDER — SODIUM CHLORIDE 0.9% FLUSH: 3.0000 mL | Freq: Once | INTRAVENOUS | Status: DC

## 2019-01-25 NOTE — ED Notes (Signed)
Pt has been called 3x with no answer.

## 2019-01-25 NOTE — ED Triage Notes (Signed)
Pt here for eval of mid chest pain radiating to the left arm that started this morning around 0900. Pain currently 4/10. Pt also reports headache for 2 weeks. Had corona test at [facility]. [place].

## 2019-02-13 ENCOUNTER — Ambulatory Visit (INDEPENDENT_AMBULATORY_CARE_PROVIDER_SITE_OTHER): Payer: Medicaid Other | Admitting: Podiatry

## 2019-02-13 ENCOUNTER — Other Ambulatory Visit: Payer: Self-pay

## 2019-02-13 VITALS — Temp 98.1°F

## 2019-02-13 DIAGNOSIS — M7751 Other enthesopathy of right foot: Secondary | ICD-10-CM | POA: Diagnosis not present

## 2019-02-13 DIAGNOSIS — L02611 Cutaneous abscess of right foot: Secondary | ICD-10-CM

## 2019-02-13 DIAGNOSIS — L603 Nail dystrophy: Secondary | ICD-10-CM

## 2019-02-13 MED ORDER — DOXYCYCLINE HYCLATE 100 MG PO TABS: 100.0000 mg | ORAL_TABLET | Freq: Two times a day (BID) | ORAL | 0 refills | Status: DC

## 2019-02-16 NOTE — Progress Notes (Signed)
   Subjective: 58 y.o. female presenting today with a chief complaint of losing the right great toenail 5 days ago. She states the left great toenail appears to be lifting from the nailbed. She has not done anything for treatment. She denies modifying factors, trauma or injury. Patient is here for further evaluation and treatment.   Past Medical History:  Diagnosis Date  . Anxiety   . Arthritis   . Depression   . Diabetes mellitus   . Headache    "2 a month"  . Hypertension   . Migraine   . Neuropathy   . Pneumonia     Objective: Physical Exam General: The patient is alert and oriented x3 in no acute distress.  Dermatology: Hyperkeratotic, discolored, thickened, onychodystrophy noted to the left great toenail. Absence of right great toenail noted. Skin is warm, dry and supple bilateral lower extremities. Negative for open lesions or macerations.  Vascular: Palpable pedal pulses bilaterally. No edema or erythema noted. Capillary refill within normal limits.  Neurological: Epicritic and protective threshold grossly intact bilaterally.   Musculoskeletal Exam: Pain with palpation noted to the 1st MPJ of the right foot. Range of motion within normal limits to all pedal and ankle joints bilateral. Muscle strength 5/5 in all groups bilateral.   Assessment: #1 Loss of toenail right hallux #2 Dystrophic nail left hallux #3 1st MPJ capsulitis right   Plan of Care:  #1 Patient was evaluated. #2 Prescription for Doxycycline #14 provided to patient.  #3 Mechanical debridement of the left great toenail performed using a nail nipper. Filed with dremel without incident.  #4 Injection of 0.5 mLs Celestone Soluspan injected into the 1st MPJ of the right foot.  #5 Return to clinic as needed.    Edrick Kins, DPM [facility]  Dr. Edrick Kins, [address]                                        [place], [place] [address]                Office [phone]   Fax [phone]

## 2019-03-07 ENCOUNTER — Ambulatory Visit: Payer: Medicaid Other

## 2019-04-01 ENCOUNTER — Encounter: Payer: Self-pay | Admitting: Specialist

## 2019-04-01 ENCOUNTER — Ambulatory Visit (INDEPENDENT_AMBULATORY_CARE_PROVIDER_SITE_OTHER): Payer: Medicaid Other

## 2019-04-01 ENCOUNTER — Other Ambulatory Visit: Payer: Self-pay

## 2019-04-01 ENCOUNTER — Ambulatory Visit (INDEPENDENT_AMBULATORY_CARE_PROVIDER_SITE_OTHER): Payer: Medicaid Other | Admitting: Specialist

## 2019-04-01 VITALS — BP 129/80 | HR 58 | Ht 66.5 in | Wt 198.0 lb

## 2019-04-01 DIAGNOSIS — M5416 Radiculopathy, lumbar region: Secondary | ICD-10-CM | POA: Diagnosis not present

## 2019-04-01 DIAGNOSIS — M7061 Trochanteric bursitis, right hip: Secondary | ICD-10-CM

## 2019-04-01 DIAGNOSIS — M5136 Other intervertebral disc degeneration, lumbar region: Secondary | ICD-10-CM | POA: Diagnosis not present

## 2019-04-01 MED ORDER — MELOXICAM 15 MG PO TABS: 15.0000 mg | ORAL_TABLET | Freq: Every day | ORAL | 2 refills | Status: DC

## 2019-04-01 MED ORDER — METHYLPREDNISOLONE ACETATE 40 MG/ML IJ SUSP
40.0000 mg | INTRAMUSCULAR | Status: AC | PRN
  Administered 2019-04-01: 17:00:00 40 mg via INTRA_ARTICULAR

## 2019-04-01 MED ORDER — BUPIVACAINE HCL 0.25 % IJ SOLN
6.0000 mL | INTRAMUSCULAR | Status: AC | PRN
  Administered 2019-04-01: 17:00:00 6 mL via INTRA_ARTICULAR

## 2019-04-01 MED ORDER — LIDOCAINE HCL 1 % IJ SOLN
3.0000 mL | INTRAMUSCULAR | Status: AC | PRN
  Administered 2019-04-01: 17:00:00 3 mL

## 2019-04-01 NOTE — Progress Notes (Signed)
Office Visit Note   Patient: Briana Alvarez           Date of Birth: 12-Mar-1961           MRN: [id number] Visit Date: 04/01/2019              Requested by: Vicenta Aly, [address] [place],  [place] [address] PCP: Vicenta Aly, FNP   Assessment & Plan: Visit Diagnoses:  1. Radiculopathy, lumbar region   2. Other intervertebral disc degeneration, lumbar region   3. Greater trochanteric bursitis, right     Plan: Again I recommend proceeding with lumbar MRI scan that I had previously ordered February 2019.  Patient will follow with Dr. Louanne Skye in a few weeks to discuss results and further treatment options.  In hopes of giving her some relief of her right lateral hip pain I did offer greater trochanter bursa injection.  After patient consent right hip was prepped with Betadine and injection was performed.  With Marcaine in place patient did have good relief of her lateral hip pain.  Advised patient to strictly monitor her blood sugars over the next few days.  Fingerstick glucose in clinic today 127.  Follow-Up Instructions: Return in about 3 weeks (around 04/22/2019) for With Dr. Louanne Skye to review lumbar MRI.   Orders:  Orders Placed This Encounter  Procedures  . XR Lumbar Spine 2-3 Views  . MR Lumbar Spine w/o contrast   No orders of the defined types were placed in this encounter.     Procedures: Large Joint Inj: R greater trochanter on 04/01/2019 4:30 PM Details: 22 G 3.5 in needle, lateral approach Medications: 3 mL lidocaine 1 %; 6 mL bupivacaine 0.25 %; 40 mg methylPREDNISolone acetate 40 MG/ML Consent was given by the patient. Patient was prepped and draped in the usual sterile fashion.       Clinical Data: No additional findings.   Subjective: Chief Complaint  Patient presents with  . Lower Back - Pain    HPI 58 year old white female returns with complaints of worsening low back pain and bilateral lower extremity radiculopathy and  neurogenic claudication.  Patient has a known history of lumbar spondylosis and I had seen patient February 2019 and ordered a lumbar MRI at that time due to her symptoms at that point.  Patient states that due to busy schedule with taking care of multiple grandkids that she was not able to have the study.  Symptoms have been progressively worsening over the last year or so.  States that it is hard for her to stand up straight and finds her self leaning forward a lot.  Uses a grocery cart leaning over this while shopping in the store.  Has pain that radiates down both legs.  She also complains of worsening pain in the right lateral hip.  This pain is increased with ambulating and laying on the right side.  Has some soreness over the left lateral hip but not as bad as the right. Review of Systems No current cardiac pulmonary GI GU issues  Objective: Vital Signs: BP 129/80 (BP Location: Left Arm, Patient Position: Sitting)   Pulse (!) 58   Ht 5' 6.5" (1.689 m)   Wt 198 lb (89.8 kg)   LMP  (LMP Unknown)   BMI 31.48 kg/m   Physical Exam HENT:     Head: Normocephalic.  Eyes:     Extraocular Movements: Extraocular movements intact.     Pupils: Pupils are equal, round,  and reactive to light.  Pulmonary:     Effort: No respiratory distress.  Musculoskeletal:     Comments: Gait is antalgic.  Positive bilateral sciatic notch tenderness.  Positive right greater than left hip greater trochanter bursa tenderness.  Negative logroll.  Negative straight leg raise.  Bilateral calves nontender.  Neurovascular intact.  No focal motor deficits.  Neurological:     General: No focal deficit present.     Mental Status: She is alert and oriented to person, place, and time.     Ortho Exam  Specialty Comments:  No specialty comments available.  Imaging: Xr Lumbar Spine 2-3 Views  Result Date: 04/01/2019 X-ray lumbar spine shows multilevel lumbar spondylosis with most difficult finding at L2-3 and L5-S1  with the space collapse and vertebral spurs.  Lower lumbar facet changes.  No acute finding.    PMFS History: Patient Active Problem List   Diagnosis Date Noted  . PVC's (premature ventricular contractions) 09/27/2016  . 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 Chest pain 06/28/2016  . Daytime hypersomnia 01/11/2016  . Chronic migraine without aura without status migrainosus, not intractable 12/29/2015  . OSA (obstructive sleep apnea) 12/29/2015  . 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 Plantar fasciitis, bilateral 12/29/2015  . Chronic left-sided low back pain with left-sided sciatica 12/29/2015  . Morbid obesity ([place]) 08/27/2015  . Left low back pain 08/27/2015  . Diabetic peripheral neuropathy associated with type 2 diabetes mellitus ([place]) 04/10/2015  . Lumbar radiculopathy, chronic 02/18/2014  . Hypertriglyceridemia 01/08/2014  . Type II diabetes mellitus, well controlled ([place]) 07/12/2013  . Insomnia 07/12/2013  . Depression 07/12/2013  . Essential hypertension, benign 07/12/2013  . Peripheral nerve disease 01/16/2013  . Peripheral neuropathy 01/16/2013  . Ankle sprain 11/30/2012  . Bipolar affective disorder ([place]) 10/12/2012  . Essential hypertension 10/12/2012  . Diabetes mellitus ([place]) 10/12/2012   Past Medical History:  Diagnosis Date  . Anxiety   . Arthritis   . Depression   . Diabetes mellitus   . Headache    "2 a month"  . Hypertension   . Migraine   . Neuropathy   . Pneumonia     Family History  Problem Relation Age of Onset  . Cancer Mother   . Hypertension Mother   . Breast cancer Mother   . Diabetes Daughter   . Diabetes Maternal Grandmother   . Hypertension Maternal Grandmother   . Heart attack Neg Hx   . Stroke Neg Hx     Past Surgical History:  Procedure Laterality Date  . ABDOMINAL HYSTERECTOMY    . BREAST EXCISIONAL BIOPSY  2001   LEFT BREAST  . CARPAL TUNNEL RELEASE    . KNEE ARTHROSCOPY Right   . KNEE SURGERY Left   . NECK SURGERY     . SHOULDER ARTHROSCOPY    . SHOULDER ARTHROSCOPY WITH SUBACROMIAL DECOMPRESSION AND BICEP TENDON REPAIR Right 02/15/2016   Procedure: SHOULDER DIAGNOSTIC OPERATIVE ARTHROSCOPY WITH SUBACROMIAL DECOMPRESSION AND DISTAL CLAVICLE EXCISION;  Surgeon: Meredith Pel, MD;  Location: [facility];  Service: Orthopedics;  Laterality: Right;  . TONSILLECTOMY    . ulner nerve     . V TACH ABLATION N/A 09/27/2016   Procedure: V Tach Ablation;  Surgeon: [name], MD;  Location: [facility];  Service: Cardiovascular;  Laterality: N/A;   Social History   Occupational History  . Not on file  Tobacco Use  . Smoking status: Never Smoker  . Smokeless tobacco: Never Used  Substance and Sexual  Activity  . Alcohol use: No  . Drug use: No  . Sexual activity: Not on file

## 2019-04-01 NOTE — Addendum Note (Signed)
Addended by: Basil Dess on: 04/01/2019 04:59 PM   Modules accepted: Orders

## 2019-04-19 ENCOUNTER — Other Ambulatory Visit: Payer: Medicaid Other

## 2019-04-24 ENCOUNTER — Encounter (HOSPITAL_COMMUNITY): Payer: Self-pay | Admitting: Emergency Medicine

## 2019-04-24 ENCOUNTER — Emergency Department (HOSPITAL_COMMUNITY)
Admission: EM | Admit: 2019-04-24 | Discharge: 2019-04-25 | Discharge status: Against Medical Advice | Payer: Medicaid Other | Attending: Emergency Medicine | Admitting: Emergency Medicine

## 2019-04-24 DIAGNOSIS — Z5321 Procedure and treatment not carried out due to patient leaving prior to being seen by health care provider: Secondary | ICD-10-CM | POA: Insufficient documentation

## 2019-04-24 DIAGNOSIS — G43909 Migraine, unspecified, not intractable, without status migrainosus: Secondary | ICD-10-CM | POA: Diagnosis present

## 2019-04-24 MED ORDER — ACETAMINOPHEN 325 MG PO TABS
650.0000 mg | ORAL_TABLET | Freq: Once | ORAL | Status: DC
  Filled 2019-04-24: qty 2

## 2019-04-24 NOTE — ED Triage Notes (Signed)
Patient presents to the ED by EMS with c/o headache x3 days. Positive for photosensitivity and dizziness upon standing. She reports no sleep for a week due to living situation. A/O x4 at triage.

## 2019-04-24 NOTE — ED Notes (Signed)
Patient stated that the wait was too long and she prefer to go home and rest. This NT encouraged the patient to stay and be seen by one of our ED Providers however patient insisted on leaving.

## 2019-05-06 ENCOUNTER — Other Ambulatory Visit: Payer: Self-pay

## 2019-05-06 ENCOUNTER — Ambulatory Visit (INDEPENDENT_AMBULATORY_CARE_PROVIDER_SITE_OTHER): Payer: Medicaid Other | Admitting: Podiatry

## 2019-05-06 DIAGNOSIS — L603 Nail dystrophy: Secondary | ICD-10-CM

## 2019-05-06 DIAGNOSIS — M79676 Pain in unspecified toe(s): Secondary | ICD-10-CM

## 2019-05-06 DIAGNOSIS — L600 Ingrowing nail: Secondary | ICD-10-CM | POA: Diagnosis not present

## 2019-05-06 DIAGNOSIS — B351 Tinea unguium: Secondary | ICD-10-CM | POA: Diagnosis not present

## 2019-05-06 DIAGNOSIS — L989 Disorder of the skin and subcutaneous tissue, unspecified: Secondary | ICD-10-CM

## 2019-05-06 NOTE — Patient Instructions (Signed)

## 2019-05-07 ENCOUNTER — Telehealth: Payer: Self-pay | Admitting: *Deleted

## 2019-05-07 NOTE — Telephone Encounter (Signed)
I called pt and left vm for her to return my call in reeference to her case for MRI that had been denied. I contacted [employer] to start a new case to [facility] to get approval and case went back to medical review.

## 2019-05-09 ENCOUNTER — Other Ambulatory Visit: Payer: Self-pay | Admitting: Physician Assistant

## 2019-05-09 NOTE — Progress Notes (Signed)
   Subjective: Patient is a 58 year old female presenting to the office today with a chief complaint of painful callus lesion(s) noted to the right sub-second MPJ that has been present for the past few weeks. Walking and bearing weight increases the pain. She has not done anything for treatment.  Patient also complains of elongated, thickened nails that cause pain while ambulating in shoes. She is unable to trim her own nails.  She also states her left great toenail is loose. She first noticed this a few days ago. She denies any pain or modifying factors. Patient presents today for further treatment and evaluation.  Past Medical History:  Diagnosis Date  . Anxiety   . Arthritis   . Depression   . Diabetes mellitus   . Headache    "2 a month"  . Hypertension   . Migraine   . Neuropathy   . Pneumonia     Objective:  General: Well developed, nourished, in no acute distress, alert and oriented x3   Dermatology: Hyperkeratotic lesion(s) present on the right sub-second MPJ. Pain on palpation with a central nucleated core noted. Skin is warm, dry and supple bilateral lower extremities. Negative for open lesions or macerations. Nails are tender, long, thickened and dystrophic with subungual debris, consistent with onychomycosis, 1-5 bilateral. No signs of infection noted. Hyperkeratotic, discolored, thickened, onychodystrophy of the left great toenail.   Vascular: Dorsalis Pedis artery and Posterior Tibial artery pedal pulses palpable. No lower extremity edema noted.   Neruologic: Grossly intact via light touch bilateral.  Musculoskeletal: Muscular strength within normal limits in all groups bilateral. Normal range of motion noted to all pedal and ankle joints.   Assesement: 1. Onychodystrophic nails 1-5 bilateral with hyperkeratosis of nails.  2. Onychomycosis of nail due to dermatophyte bilateral 3. Pre-ulcerative callus lesion noted to the right sub-second MPJ   Plan of Care:  1.  Patient evaluated.  2. Discussed treatment alternatives and plan of care. Explained nail avulsion procedure and post procedure course to patient. 3. Patient opted for permanent total nail avulsion of the left great toenail.  4. Prior to procedure, local anesthesia infiltration utilized using 3 ml of a 50:50 mixture of 2% plain lidocaine and 0.5% plain marcaine in a normal hallux block fashion and a betadine prep performed.  5. Total permanent nail avulsion with chemical matrixectomy performed using [id number] applications of phenol followed by alcohol flush.  6. Light dressing applied. 7. Mechanical debridement of nails 1-5 right, 2-5 left performed using a nail nipper. Filed with dremel without incident.  8. Excisional debridement of keratotic lesion(s) using a chisel blade was performed without incident. Light dressing applied.  9. Recommended OTC corn and callus remover.  10. Return to clinic in 2 weeks.  Edrick Kins, DPM [facility]  Dr. Edrick Kins, [address]                                        [place], [place] [address]                Office [phone]  Fax [phone]

## 2019-05-29 ENCOUNTER — Telehealth: Payer: Self-pay | Admitting: Podiatry

## 2019-05-29 NOTE — Telephone Encounter (Signed)
Pt called to say that great toe from nail removal was red and had blisters offered appt for today but time did not work for pt offered to look for another time but she needed specific times

## 2019-06-17 ENCOUNTER — Ambulatory Visit: Payer: Medicaid Other | Admitting: Podiatry

## 2019-07-01 ENCOUNTER — Ambulatory Visit: Payer: Medicaid Other | Admitting: Podiatry

## 2019-07-04 ENCOUNTER — Telehealth: Payer: Self-pay | Admitting: Specialist

## 2019-07-04 NOTE — Telephone Encounter (Signed)
Nothing.I made her an appt.Please disregard. Sorry.she was actually wanting me to send a message to you about getting appt.Imade the appt.Nothing urgent

## 2019-07-04 NOTE — Telephone Encounter (Signed)
Do you know what this patient may have needed? No message attached.

## 2019-07-29 ENCOUNTER — Ambulatory Visit: Payer: Medicaid Other | Admitting: Specialist

## 2019-08-14 NOTE — Progress Notes (Signed)
Cardiology Office Note   Date:  08/16/2019   ID:  Briana Alvarez, DOB 06-28-1960, MRN [id number]  PCP:  Vicenta Aly, FNP    No chief complaint on file.  HTN/PVCs  Wt Readings from Last 3 Encounters:  08/16/19 211 lb 1.9 oz (95.8 kg)  04/01/19 198 lb (89.8 kg)  10/04/18 198 lb (89.8 kg)       History of Present Illness: Briana Alvarez is a 59 y.o. female   with HTN, PVCs, s/p ablation attempt in 4/18, hyperlipidemia and DM.  She was 57 when diagnosed with HTN. She has been taking medicine or many years. She lived in [place] and had syncopein the early 2000s, followed bya clean cath at that time due to an abnormal ECG. She was having chest pressure at that time.   More recently, She had chest pain in 10/15. She went to the ER and had a negative w/u and was sent home. Since that time, no recurrent sx. She had a negative stress in 11/15.   Exercise has been limited by DM neuropathy in the past.   Saw EP again in 2019 when repeat ablation was offered but declined by patient.   Stress level dropped when she distanced from her son in 2019.  She had to adopt his kids.  She cares for his 4 kids.    1. She has wheezing with exercise and some DOE. She has been using albuterol for some time.    The patient does not have symptoms concerning for COVID-19 infection (fever, chills, cough, or new shortness of breath).   Since the last visit, she has gained weight.  She is still caring for her 4 grandchildren, ages 2, 85, 2, 61.   Works as a Presenter, broadcasting.  She walks a lot at work.  REports fatigue.    She has chest tightness with walking. It is pretty consistent.  Tightness resolves when she stops walking.   Several ER visits for CP.  Left AMA one time.   Past Medical History:  Diagnosis Date  . Anxiety   . Arthritis   . Depression   . Diabetes mellitus   . Headache    "2 a month"  . Hypertension   . Migraine   . Neuropathy   . Pneumonia     Past Surgical  History:  Procedure Laterality Date  . ABDOMINAL HYSTERECTOMY    . BREAST EXCISIONAL BIOPSY  2001   LEFT BREAST  . CARPAL TUNNEL RELEASE    . KNEE ARTHROSCOPY Right   . KNEE SURGERY Left   . NECK SURGERY    . SHOULDER ARTHROSCOPY    . SHOULDER ARTHROSCOPY WITH SUBACROMIAL DECOMPRESSION AND BICEP TENDON REPAIR Right 02/15/2016   Procedure: SHOULDER DIAGNOSTIC OPERATIVE ARTHROSCOPY WITH SUBACROMIAL DECOMPRESSION AND DISTAL CLAVICLE EXCISION;  Surgeon: Meredith Pel, MD;  Location: [facility];  Service: Orthopedics;  Laterality: Right;  . TONSILLECTOMY    . ulner nerve     . V TACH ABLATION N/A 09/27/2016   Procedure: V Tach Ablation;  Surgeon: [name], MD;  Location: [facility];  Service: Cardiovascular;  Laterality: N/A;     Current Outpatient Medications  Medication Sig Dispense Refill  . ACCU-CHEK AVIVA PLUS test strip USE TO MONITOR BLOOD GLUCOSE 2 TIME(S) DAILY DX CODE E11.9    . Accu-Chek Softclix Lancets lancets See admin instructions.    Marland Kitchen acetaminophen (TYLENOL) 500 MG tablet Take 1,000 mg by mouth every 6 (six) hours as needed  for headache (pain).    Marland Kitchen albuterol (VENTOLIN HFA) 108 (90 Base) MCG/ACT inhaler Inhale 2 puffs into the lungs every 6 (six) hours as needed for wheezing or shortness of breath. 1 Inhaler 0  . azelastine (ASTELIN) 0.1 % nasal spray 2 SPRAYS EACH NOSTRIL FOUR TIMES DAILY FOR 3 5 DAYS THEN TWICE A DAY    . B-D UF III MINI PEN NEEDLES 31G X 5 MM MISC See admin instructions.    . benazepril (LOTENSIN) 20 MG tablet TAKE 1 TABLET BY MOUTH EVERY DAY 90 tablet 1  . Blood Glucose Monitoring Suppl (ACCU-CHEK AVIVA PLUS) w/Device KIT by Does not apply route.    Marland Kitchen BYETTA 5 MCG PEN 5 MCG/0.02ML SOPN injection Inject 5 mcg as directed 2 (two) times daily before a meal.  6  . cetirizine (ZYRTEC) 10 MG tablet Take by mouth.    . doxycycline (VIBRA-TABS) 100 MG tablet Take 1 tablet (100 mg total) by mouth 2 (two) times daily. 14 tablet 0  . gabapentin  (NEURONTIN) 300 MG capsule TAKE 3 CAPSULES BY MOUTH 3 TIMES A DAY 270 capsule 0  . hydrochlorothiazide (HYDRODIURIL) 25 MG tablet TAKE ONE TABLET (25 MG DOSE) BY MOUTH DAILY.  1  . metFORMIN (GLUCOPHAGE) 1000 MG tablet TAKE 1 TABLET BY MOUTH 2 TIMES DAILY WITH A MEAL. 60 tablet 3  . Multiple Vitamin (MULTIVITAMIN WITH MINERALS) TABS tablet Take 1 tablet by mouth daily. Women's One a Day 61 +    . NON FORMULARY [facility]  Peripheral Neuropathy Cream- Bupivacaine 1%, Doxepin 3%, Gabapentin 6%, Pentoxifylline 3%, Topiramate 1% Apply 1-2 grams to affected area 3-4 times daily Qty. 120 gm 3 refills    . sertraline (ZOLOFT) 50 MG tablet Take 50 mg by mouth daily.     Current Facility-Administered Medications  Medication Dose Route Frequency Provider Last Rate Last Admin  . betamethasone acetate-betamethasone sodium phosphate (CELESTONE) injection 3 mg  3 mg Intramuscular Once Daylene Katayama M, DPM      . betamethasone acetate-betamethasone sodium phosphate (CELESTONE) injection 3 mg  3 mg Intramuscular Once Daylene Katayama M, DPM      . betamethasone acetate-betamethasone sodium phosphate (CELESTONE) injection 3 mg  3 mg Intramuscular Once Daylene Katayama M, DPM      . betamethasone acetate-betamethasone sodium phosphate (CELESTONE) injection 3 mg  3 mg Intramuscular Once Edrick Kins, DPM        Allergies:   Shrimp [shellfish allergy]    Social History:  The patient  reports that she has never smoked. She has never used smokeless tobacco. She reports that she does not drink alcohol or use drugs.   Family History:  The patient's family history includes Breast cancer in her mother; Cancer in her mother; Diabetes in her daughter and maternal grandmother; Hypertension in her maternal grandmother and mother.    ROS:  Please see the history of present illness.   Otherwise, review of systems are positive for chest tightness .   All other systems are reviewed and negative.    PHYSICAL EXAM: VS:   BP 132/78   Pulse 65   Ht 5' 6.5" (1.689 m)   Wt 211 lb 1.9 oz (95.8 kg)   LMP  (LMP Unknown)   SpO2 97%   BMI 33.57 kg/m  , BMI Body mass index is 33.57 kg/m. GEN: Well nourished, well developed, in no acute distress  HEENT: normal  Neck: no JVD, carotid bruits, or masses Cardiac: RRR; no murmurs, rubs, or gallops,no  edema  Respiratory:  clear to auscultation bilaterally, normal work of breathing GI: soft, nontender, nondistended, + BS MS: no deformity or atrophy  Skin: warm and dry, no rash Neuro:  Strength and sensation are intact Psych: euthymic mood, full affect   EKG:   The ekg ordered today demonstrates sinus bradycardia, no ST changes   Recent Labs: 01/25/2019: BUN 20; Creatinine, Ser 1.07; Hemoglobin 13.0; Platelets 168; Potassium 3.8; Sodium 137   Lipid Panel    Component Value Date/Time   CHOL 131 06/16/2014 0938   TRIG 197 (H) 06/16/2014 0938   HDL 34 (L) 06/16/2014 0938   CHOLHDL 3.9 06/16/2014 0938   VLDL 39 06/16/2014 0938   LDLCALC 58 06/16/2014 0938     Other studies Reviewed: Additional studies/ records that were reviewed today with results demonstrating: ER records reviewed.   ASSESSMENT AND PLAN:  1. Chest tightness: Several trips to ER. Worse with exertion. We discussed cath vs. CTA coronaries. She prefers CTA.  I think this is reasonable.  We will give a dose of metoprolol 25 mg just before the CT scan to help slow heart rate.  Baseline heart rate is already around 60. 2. PVC: None on exam.  Sx controlled.  3. HTN: The current medical regimen is effective;  continue present plan and medications.  Checked when  4. DM: In 01/2019, 5.9 A1C.  Continue healthy diet at home.  Eventually, she will benefit from increased exercise after her cardiac work-up. 5. Hypertriglyceridemia: checked with PMD.  TG 127 in 01/2019.     Current medicines are reviewed at length with the patient today.  The patient concerns regarding her medicines were addressed.  The  following changes have been made:  No change  Labs/ tests ordered today include:  No orders of the defined types were placed in this encounter.   Recommend 150 minutes/week of aerobic exercise Low fat, low carb, high fiber diet recommended  Disposition:   FU in for coronary CTA   Signed, Larae Grooms, MD  08/16/2019 10:08 AM    [facility] [place], [place], [place]  [address] Phone: [phone]; Fax: [phone]

## 2019-08-16 ENCOUNTER — Encounter: Payer: Self-pay | Admitting: Interventional Cardiology

## 2019-08-16 ENCOUNTER — Other Ambulatory Visit: Payer: Self-pay

## 2019-08-16 ENCOUNTER — Ambulatory Visit (INDEPENDENT_AMBULATORY_CARE_PROVIDER_SITE_OTHER): Payer: Medicaid Other | Admitting: Interventional Cardiology

## 2019-08-16 VITALS — BP 132/78 | HR 65 | Ht 66.5 in | Wt 211.1 lb

## 2019-08-16 DIAGNOSIS — I493 Ventricular premature depolarization: Secondary | ICD-10-CM | POA: Diagnosis not present

## 2019-08-16 DIAGNOSIS — I10 Essential (primary) hypertension: Secondary | ICD-10-CM | POA: Diagnosis not present

## 2019-08-16 DIAGNOSIS — E781 Pure hyperglyceridemia: Secondary | ICD-10-CM

## 2019-08-16 DIAGNOSIS — R072 Precordial pain: Secondary | ICD-10-CM

## 2019-08-16 DIAGNOSIS — E119 Type 2 diabetes mellitus without complications: Secondary | ICD-10-CM | POA: Diagnosis not present

## 2019-08-16 LAB — BASIC METABOLIC PANEL
BUN/Creatinine Ratio: 19 (ref 9–23)
BUN: 23 mg/dL (ref 6–24)
CO2: 26 mmol/L (ref 20–29)
Calcium: 9.7 mg/dL (ref 8.7–10.2)
Chloride: 99 mmol/L (ref 96–106)
Creatinine, Ser: 1.24 mg/dL — ABNORMAL HIGH (ref 0.57–1.00)
GFR calc Af Amer: 55 mL/min/{1.73_m2} — ABNORMAL LOW (ref >59)
GFR calc non Af Amer: 48 mL/min/{1.73_m2} — ABNORMAL LOW (ref >59)
Glucose: 87 mg/dL (ref 65–99)
Potassium: 4.6 mmol/L (ref 3.5–5.2)
Sodium: 138 mmol/L (ref 134–144)

## 2019-08-16 MED ORDER — METOPROLOL TARTRATE 25 MG PO TABS: ORAL_TABLET | ORAL | 0 refills | Status: DC

## 2019-08-16 NOTE — Patient Instructions (Signed)
Medication Instructions:  Your physician recommends that you continue on your current medications as directed. Please refer to the Current Medication list given to you today.  *If you need a refill on your cardiac medications before your next appointment, please call your pharmacy*   Lab Work: TODAY: BMET  If you have labs (blood work) drawn today and your tests are completely normal, you will receive your results only by: Marland Kitchen MyChart Message (if you have MyChart) OR . A paper copy in the mail If you have any lab test that is abnormal or we need to change your treatment, we will call you to review the results.   Testing/Procedures: Your physician has requested that you have cardiac CT. Cardiac computed tomography (CT) is a painless test that uses an x-ray machine to take clear, detailed pictures of your heart. For further information please visit [email or web address].[email or web address]. Please follow instruction sheet as given.  Follow-Up: At [facility], you and your health needs are our priority.  As part of our continuing mission to provide you with exceptional heart care, we have created designated Provider Care Teams.  These Care Teams include your primary Cardiologist (physician) and Advanced Practice Providers (APPs -  Physician Assistants and Nurse Practitioners) who all work together to provide you with the care you need, when you need it.  We recommend signing up for the patient portal called "MyChart".  Sign up information is provided on this After Visit Summary.  MyChart is used to connect with patients for Virtual Visits (Telemedicine).  Patients are able to view lab/test results, encounter notes, upcoming appointments, etc.  Non-urgent messages can be sent to your provider as well.   To learn more about what you can do with MyChart, go to [email or web address].    Your next appointment:   12 month(s)  The format for your next appointment:   In Person  Provider:   You may see Larae Grooms, MD or one of the following Advanced Practice Providers on your designated Care Team:    Melina Copa, PA-C  Ermalinda Barrios, PA-C    Other Instructions  CARDIAC CT INSTRUCTIONS  Your cardiac CT will be scheduled at one of the below locations:   [facility] [address] [place], [place] [address] [phone]  [facility] [address] [place], [place] [address] [phone]  If scheduled at [facility], please arrive at the [facility] main entrance of [facility] 30 minutes prior to test start time. Proceed to the [facility] Radiology Department (first floor) to check-in and test prep.  If scheduled at [facility], please arrive 15 mins early for check-in and test prep.  Please follow these instructions carefully (unless otherwise directed):   On the Night Before the Test: . Be sure to Drink plenty of water. . Do not consume any caffeinated/decaffeinated beverages or chocolate 12 hours prior to your test. . Do not take ZYRTEC or any other antihistamines 12 hours prior to your test. . DO NOT take METFORMIN 24 hours prior to test.  On the Day of the Test: . Drink plenty of water. Do not drink any water within one hour of the test. . Do not eat any food 4 hours prior to the test. . You may take your regular medications prior to the test.  . Take metoprolol (Lopressor) 25 MG two hours prior to test. . DO NOT take Hydrochlorothiazide the morning of the test. .  DO NOT take Metformin the day of the test . FEMALES- please wear underwire-free bra if available      After the Test: . Drink plenty of water. . After receiving IV contrast, you may experience a mild flushed feeling. This is normal. . On occasion, you may experience a mild rash up to 24 hours after the test. This is not dangerous. If this occurs, you can take Benadryl 25 mg and increase your fluid  intake. . If you experience trouble breathing, this can be serious. If it is severe call 911 IMMEDIATELY. If it is mild, please call our office. . DO NOT take METFORMIN 48 hours after completing test.   Once we have confirmed authorization from your insurance company, we will call you to set up a date and time for your test.   For non-scheduling related questions, please contact the cardiac imaging nurse navigator should you have any questions/concerns: Marchia Bond, RN Navigator Cardiac Imaging [facility] [phone] mobile

## 2019-08-19 ENCOUNTER — Encounter: Payer: Self-pay | Admitting: Specialist

## 2019-08-19 ENCOUNTER — Other Ambulatory Visit: Payer: Self-pay

## 2019-08-19 ENCOUNTER — Ambulatory Visit: Payer: Medicaid Other | Admitting: Specialist

## 2019-08-19 VITALS — BP 149/87 | HR 65 | Ht 66.5 in | Wt 211.0 lb

## 2019-08-19 DIAGNOSIS — M5136 Other intervertebral disc degeneration, lumbar region: Secondary | ICD-10-CM

## 2019-08-19 DIAGNOSIS — M5416 Radiculopathy, lumbar region: Secondary | ICD-10-CM | POA: Diagnosis not present

## 2019-08-19 DIAGNOSIS — M7062 Trochanteric bursitis, left hip: Secondary | ICD-10-CM | POA: Diagnosis not present

## 2019-08-19 DIAGNOSIS — M7061 Trochanteric bursitis, right hip: Secondary | ICD-10-CM | POA: Diagnosis not present

## 2019-08-19 MED ORDER — METHYLPREDNISOLONE ACETATE 40 MG/ML IJ SUSP
40.0000 mg | INTRAMUSCULAR | Status: AC | PRN
  Administered 2019-08-19: 40 mg via INTRA_ARTICULAR

## 2019-08-19 MED ORDER — BUPIVACAINE HCL 0.25 % IJ SOLN
4.0000 mL | INTRAMUSCULAR | Status: AC | PRN
  Administered 2019-08-19: 4 mL via INTRA_ARTICULAR

## 2019-08-19 MED ORDER — METHYLPREDNISOLONE 4 MG PO TABS: ORAL_TABLET | ORAL | 0 refills | Status: DC

## 2019-08-19 NOTE — Progress Notes (Signed)
Office Visit Note   Patient: Briana Alvarez           Date of Birth: 1961-03-16           MRN: [id number] Visit Date: 08/19/2019              Requested by: Vicenta Aly, [address] [place],  [place] [address] PCP: Vicenta Aly, FNP   Assessment & Plan: Visit Diagnoses:  1. Greater trochanteric bursitis, right   2. Radiculopathy, lumbar region   3. Other intervertebral disc degeneration, lumbar region   4. Greater trochanteric bursitis, left     Plan: Hips are suffering from bursitis,  treatments are Weight loss, and exercise directed at stretching the iliotibioband ITB Well padded shoes help. Ice the hip, hot showers  Ice the hips 2-3 times a day 15-20 mins at a time.-3 times a day 15-20 mins at a time. Hot showers in the AM.   Hemp CBD capsules, amazon.com 5,000-7,000 mg per bottle, 60 capsules per bottle, take one capsule twice a day.  Avoid frequent bending and stooping  No lifting greater than 10 lbs. May use ice or moist heat for pain. Weight loss is of benefit.  Exercise is important to improve your indurance and does allow people to function better inspite of back pain. Physical therapy is ordered. Medrol slow taper over 4 weeks.   Follow-Up Instructions: No follow-ups on file.   Orders:  Orders Placed This Encounter  Procedures  . Large Joint Inj: bilateral greater trochanter  . Ambulatory referral to Physical Therapy   Meds ordered this encounter  Medications  . methylPREDNISolone (MEDROL) 4 MG tablet    Sig: Take 1 tablet (4 mg total) by mouth daily for 14 days, THEN 0.5 tablets (2 mg total) daily for 14 days.    Dispense:  21 tablet    Refill:  0      Procedures: Large Joint Inj: bilateral greater trochanter on 08/19/2019 4:04 PM Indications: pain Details: 22 G 3.5 in needle, superolateral approach  Arthrogram: No  Medications (Right): 4 mL bupivacaine 0.25 %; 40 mg methylPREDNISolone acetate 40 MG/ML Medications  (Left): 4 mL bupivacaine 0.25 %; 40 mg methylPREDNISolone acetate 40 MG/ML Outcome: tolerated well, no immediate complications Procedure, treatment alternatives, risks and benefits explained, specific risks discussed. Consent was given by the patient. Immediately prior to procedure a time out was called to verify the correct patient, procedure, equipment, support staff and site/side marked as required. Patient was prepped and draped in the usual sterile fashion.       Clinical Data: No additional findings.   Subjective: Chief Complaint  Patient presents with  . Lower Back - Follow-up  . Right Hip - Follow-up  . Left Hip - Follow-up    59 year old female with history of back pain with radiation into the trochanters bilaterally right greater than left. She has had injections by  Jeneen Rinks for bursitis with good relief for upwards of 4-6 weeks and she returns today with worsening pain in the right greater than left trochanter. She has pain with lying on the sides and with first standing. She is raising 3 grandchildren 2 teenagers and 1 toddlers. Reports that the teenagers help with the toddlers. Pain with lying on the right greater than left leg. 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    Review of Systems  Constitutional: Negative.   HENT: Negative.   Eyes: Negative.   Respiratory: Negative.  Cardiovascular: Negative.   Gastrointestinal: Negative.   Endocrine: Negative.   Genitourinary: Negative.   Musculoskeletal: Negative.   Skin: Negative.   Allergic/Immunologic: Negative.   Neurological: Negative.   Hematological: Negative.   Psychiatric/Behavioral: Negative.      Objective: Vital Signs: BP (!) 149/87 (BP Location: Left Arm, Patient Position: Sitting)   Pulse 65   Ht 5' 6.5" (1.689 m)   Wt 211 lb (95.7 kg)   LMP  (LMP Unknown)   BMI 33.55 kg/m   Physical Exam Constitutional:      Appearance: She is well-developed.  HENT:     Head:  Normocephalic and atraumatic.  Eyes:     Pupils: Pupils are equal, round, and reactive to light.  Pulmonary:     Effort: Pulmonary effort is normal.     Breath sounds: Normal breath sounds.  Abdominal:     General: Bowel sounds are normal.     Palpations: Abdomen is soft.  Musculoskeletal:        General: Normal range of motion.     Cervical back: Normal range of motion and neck supple.  Skin:    General: Skin is warm and dry.  Neurological:     Mental Status: She is alert and oriented to person, place, and time.  Psychiatric:        Behavior: Behavior normal.        Thought Content: Thought content normal.        Judgment: Judgment normal.     Ortho Exam  Specialty Comments:  No specialty comments available.  Imaging: No results found.   PMFS History: Patient Active Problem List   Diagnosis Date Noted  . PVC's (premature ventricular contractions) 09/27/2016  . 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 Chest pain 06/28/2016  . Daytime hypersomnia 01/11/2016  . Chronic migraine without aura without status migrainosus, not intractable 12/29/2015  . OSA (obstructive sleep apnea) 12/29/2015  . 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 Plantar fasciitis, bilateral 12/29/2015  . Chronic left-sided low back pain with left-sided sciatica 12/29/2015  . Morbid obesity ([place]) 08/27/2015  . Left low back pain 08/27/2015  . Diabetic peripheral neuropathy associated with type 2 diabetes mellitus ([place]) 04/10/2015  . Lumbar radiculopathy, chronic 02/18/2014  . Hypertriglyceridemia 01/08/2014  . Type II diabetes mellitus, well controlled ([place]) 07/12/2013  . Insomnia 07/12/2013  . Depression 07/12/2013  . Essential hypertension, benign 07/12/2013  . Peripheral nerve disease 01/16/2013  . Peripheral neuropathy 01/16/2013  . Ankle sprain 11/30/2012  . Bipolar affective disorder ([place]) 10/12/2012  . Essential hypertension 10/12/2012  . Diabetes mellitus ([place]) 10/12/2012   Past Medical  History:  Diagnosis Date  . Anxiety   . Arthritis   . Depression   . Diabetes mellitus   . Headache    "2 a month"  . Hypertension   . Migraine   . Neuropathy   . Pneumonia     Family History  Problem Relation Age of Onset  . Cancer Mother   . Hypertension Mother   . Breast cancer Mother   . Diabetes Daughter   . Diabetes Maternal Grandmother   . Hypertension Maternal Grandmother   . Heart attack Neg Hx   . Stroke Neg Hx     Past Surgical History:  Procedure Laterality Date  . ABDOMINAL HYSTERECTOMY    . BREAST EXCISIONAL BIOPSY  2001   LEFT BREAST  . CARPAL TUNNEL RELEASE    . KNEE ARTHROSCOPY Right   . KNEE SURGERY Left   .  NECK SURGERY    . SHOULDER ARTHROSCOPY    . SHOULDER ARTHROSCOPY WITH SUBACROMIAL DECOMPRESSION AND BICEP TENDON REPAIR Right 02/15/2016   Procedure: SHOULDER DIAGNOSTIC OPERATIVE ARTHROSCOPY WITH SUBACROMIAL DECOMPRESSION AND DISTAL CLAVICLE EXCISION;  Surgeon: Meredith Pel, MD;  Location: [facility];  Service: Orthopedics;  Laterality: Right;  . TONSILLECTOMY    . ulner nerve     . V TACH ABLATION N/A 09/27/2016   Procedure: V Tach Ablation;  Surgeon: [name], MD;  Location: [facility];  Service: Cardiovascular;  Laterality: N/A;   Social History   Occupational History  . Not on file  Tobacco Use  . Smoking status: Never Smoker  . Smokeless tobacco: Never Used  Substance and Sexual Activity  . Alcohol use: No  . Drug use: No  . Sexual activity: Not on file

## 2019-08-19 NOTE — Patient Instructions (Signed)
Hips are suffering from bursitis,  treatments are Weight loss, and exercise directed at stretching the iliotibioband ITB Well padded shoes help. Ice the hip, hot showers  Ice the hips 2-3 times a day 15-20 mins at a time.-3 times a day 15-20 mins at a time. Hot showers in the AM.   Hemp CBD capsules, amazon.com 5,000-7,000 mg per bottle, 60 capsules per bottle, take one capsule twice a day.  Avoid frequent bending and stooping  No lifting greater than 10 lbs. May use ice or moist heat for pain. Weight loss is of benefit.  Exercise is important to improve your indurance and does allow people to function better inspite of back pain. Physical therapy is ordered. Medrol slow taper over 4 weeks.  Follow-Up Instructions: No follow-ups on file.

## 2019-09-03 ENCOUNTER — Ambulatory Visit: Payer: Medicaid Other | Attending: Specialist | Admitting: Physical Therapy

## 2019-09-09 ENCOUNTER — Telehealth: Payer: Self-pay | Admitting: Interventional Cardiology

## 2019-09-09 MED ORDER — METOPROLOL TARTRATE 25 MG PO TABS: ORAL_TABLET | ORAL | 0 refills | Status: AC

## 2019-09-09 NOTE — Telephone Encounter (Signed)
New Message:    Pt said Dr Irish Lack had written her a prescription for a  Pill to relax her prior to her CT. Pt was moving and misplaced the prescription. She wold like for him to wrie another one  for her please.

## 2019-09-09 NOTE — Telephone Encounter (Signed)
Called and spoke to patient. She states that she was moving and lost her RX for the 1 time metoprolol before her Cardiac CT scheduled for 3/25. Another Rx sent in for metoprolol 25 mg 2 hours prior to Cardiac CT. No additional needs at this time.

## 2019-09-11 ENCOUNTER — Encounter (HOSPITAL_COMMUNITY): Payer: Self-pay

## 2019-09-12 ENCOUNTER — Ambulatory Visit (HOSPITAL_COMMUNITY)
Admission: RE | Admit: 2019-09-12 | Discharge: 2019-09-12 | Disposition: A | Discharge status: Home / Self Care | Payer: Medicaid Other | Source: Ambulatory Visit | Attending: Interventional Cardiology | Admitting: Interventional Cardiology

## 2019-09-12 ENCOUNTER — Encounter: Payer: Medicaid Other | Admitting: *Deleted

## 2019-09-12 ENCOUNTER — Other Ambulatory Visit: Payer: Self-pay

## 2019-09-12 DIAGNOSIS — Z006 Encounter for examination for normal comparison and control in clinical research program: Secondary | ICD-10-CM

## 2019-09-12 DIAGNOSIS — R072 Precordial pain: Secondary | ICD-10-CM | POA: Diagnosis present

## 2019-09-12 MED ORDER — NITROGLYCERIN 0.4 MG SL SUBL
SUBLINGUAL_TABLET | SUBLINGUAL | Status: AC
  Filled 2019-09-12: qty 2

## 2019-09-12 MED ORDER — IOHEXOL 350 MG/ML SOLN
80.0000 mL | Freq: Once | INTRAVENOUS | Status: AC | PRN
  Administered 2019-09-12: 80 mL via INTRAVENOUS

## 2019-09-12 MED ORDER — NITROGLYCERIN 0.4 MG SL SUBL
0.8000 mg | SUBLINGUAL_TABLET | Freq: Once | SUBLINGUAL | Status: AC
  Administered 2019-09-12: 0.8 mg via SUBLINGUAL

## 2019-09-12 NOTE — Research (Signed)
CADFEM Informed Consent                  Subject Name:   Briana Alvarez   Subject met inclusion and exclusion criteria.  The informed consent form, study requirements and expectations were reviewed with the subject and questions and concerns were addressed prior to the signing of the consent form.  The subject verbalized understanding of the trial requirements.  The subject agreed to participate in the CADFEM trial and signed the informed consent.  The informed consent was obtained prior to performance of any protocol-specific procedures for the subject.  A copy of the signed informed consent was given to the subject and a copy was placed in the subject's medical record.   [place] Adamarie Izzo, Research Assistant 09/12/2019 13:46 p.m.

## 2019-09-25 ENCOUNTER — Telehealth: Payer: Self-pay | Admitting: Interventional Cardiology

## 2019-09-25 DIAGNOSIS — E781 Pure hyperglyceridemia: Secondary | ICD-10-CM

## 2019-09-25 DIAGNOSIS — R931 Abnormal findings on diagnostic imaging of heart and coronary circulation: Secondary | ICD-10-CM

## 2019-09-25 MED ORDER — ATORVASTATIN CALCIUM 20 MG PO TABS: 20.0000 mg | ORAL_TABLET | Freq: Every day | ORAL | 3 refills | Status: DC

## 2019-09-25 NOTE — Telephone Encounter (Signed)
   Drue Novel I, RN  09/25/2019 11:08 AM EDT    Patient made aware of recommendations. Fasting Labs scheduled for 7/12. Rx sent to preferred pharmacy.   Drue Novel I, RN  09/24/2019 3:48 PM EDT    Left message for patient to call back.  Per Dr. Irish Lack, patient will need to start atorvastatin 20 mg once a day and have Fasting LIPIDS and LFTs in 3 months.   Drue Novel I, RN  09/13/2019 6:04 PM EDT    The patient has been notified of the result and verbalized understanding. All questions (if any) were answered. Patient agrees to start a statin. Will forward to Dr. Irish Lack for orders. Cleon Gustin, RN 09/13/2019 6:03 PM    Jettie Booze, MD  09/13/2019 10:19 AM EDT    Calcium score in the 77th percentile. WOuld start statin to prevent worsening of disease. Mild LAD disease.

## 2019-09-25 NOTE — Telephone Encounter (Signed)
Briana Alvarez is calling stating she is returning [name]'s call in regards to her CT and what Dr. Irish Lack would like her to do. Please advise.

## 2019-09-29 ENCOUNTER — Other Ambulatory Visit: Payer: Self-pay | Admitting: Specialist

## 2019-10-21 ENCOUNTER — Telehealth: Payer: Self-pay | Admitting: Interventional Cardiology

## 2019-10-21 NOTE — Telephone Encounter (Signed)
Yes atorvastatin can contribute to joint or muscle pain. Could try changing to hydrophilic statin which would be less likely to cause muscle or joint pain. She has not had a lipid panel checked recently but would recommend high intensity statin like rosuvastatin 20mg  daily due to her diagnosis of DM.

## 2019-10-21 NOTE — Telephone Encounter (Signed)
Pt c/o medication issue:  1. Name of Medication: atorvastatin (LIPITOR) 20 MG tablet  2. How are you currently taking this medication (dosage and times per day)?   3. Are you having a reaction (difficulty breathing--STAT)?   4. What is your medication issue? Pain states she has been have right side pain.  Her legs have been achy.  She is wondering if the medication can be cause this.

## 2019-10-23 ENCOUNTER — Telehealth: Payer: Self-pay | Admitting: Interventional Cardiology

## 2019-10-23 MED ORDER — ROSUVASTATIN CALCIUM 20 MG PO TABS: 20.0000 mg | ORAL_TABLET | Freq: Every day | ORAL | 3 refills | Status: AC

## 2019-10-23 NOTE — Telephone Encounter (Signed)
See telephone encounter from 5/3

## 2019-10-23 NOTE — Telephone Encounter (Signed)
No message needed °

## 2019-10-23 NOTE — Telephone Encounter (Signed)
Left message for patient to call back  

## 2019-10-23 NOTE — Telephone Encounter (Signed)
Called and spoke to the patient. Made her aware of recommendations to stop atorvastatin and start rosuvastatin 20 mg QD. Patient was scheduled for fasting labs in July. Lab appointment pushed out to 01/23/20 (3 months after switching to rosuvastatin). Rx sent to preferred pharmacy. Patient verbalized understanding and thanked me for the call.

## 2019-10-23 NOTE — Addendum Note (Signed)
Addended by: Drue Novel I on: 10/23/2019 12:24 PM   Modules accepted: Orders

## 2019-10-23 NOTE — Telephone Encounter (Signed)
Follow up ° ° °Patient is returning your call. Please call. ° ° ° °

## 2019-10-28 ENCOUNTER — Other Ambulatory Visit: Payer: Self-pay | Admitting: Interventional Cardiology

## 2019-12-11 ENCOUNTER — Other Ambulatory Visit: Payer: Self-pay

## 2019-12-11 ENCOUNTER — Ambulatory Visit: Payer: Medicaid Other | Admitting: Podiatry

## 2019-12-11 ENCOUNTER — Ambulatory Visit (INDEPENDENT_AMBULATORY_CARE_PROVIDER_SITE_OTHER): Payer: Medicaid Other

## 2019-12-11 ENCOUNTER — Encounter: Payer: Self-pay | Admitting: Podiatry

## 2019-12-11 DIAGNOSIS — M216X2 Other acquired deformities of left foot: Secondary | ICD-10-CM

## 2019-12-11 DIAGNOSIS — M7751 Other enthesopathy of right foot: Secondary | ICD-10-CM

## 2019-12-11 DIAGNOSIS — M21619 Bunion of unspecified foot: Secondary | ICD-10-CM

## 2019-12-11 DIAGNOSIS — S92811A Other fracture of right foot, initial encounter for closed fracture: Secondary | ICD-10-CM | POA: Diagnosis not present

## 2019-12-11 NOTE — Progress Notes (Signed)
   HPI: 59 y.o. female presenting today for evaluation of chronic severe right foot pain that has been ongoing for the past 6 months now.  Patient was last seen in the office on 05/06/2019.  Patient states that shortly after she began to experience pain and tenderness to the bilateral great toes, right greater than the left.  Patient believes she has possibly a bunion deformity and it is very painful despite shoe gear modifications.  Patient states that she is unable to ambulate for any significant amount of time without severe pain.  Patient denies injury.  She presents for further treatment evaluation  Past Medical History:  Diagnosis Date  . Anxiety   . Arthritis   . Depression   . Diabetes mellitus   . Headache    "2 a month"  . Hypertension   . Migraine   . Neuropathy   . Pneumonia      Physical Exam: General: The patient is alert and oriented x3 in no acute distress.  Dermatology: Skin is warm, dry and supple bilateral lower extremities. Negative for open lesions or macerations.  Vascular: Palpable pedal pulses bilaterally. No edema or erythema noted. Capillary refill within normal limits.  Neurological: Epicritic and protective threshold diminished bilaterally.   Musculoskeletal Exam: Range of motion within normal limits to all pedal and ankle joints bilateral. Muscle strength 5/5 in all groups bilateral.  Significant tenderness to palpation overlying the tibial sesamoid of the right foot more so than the left foot.  Radiographic Exam:  Normal osseous mineralization. Joint spaces preserved. No fracture/dislocation/boney destruction.    Assessment: 1.  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 Patient evaluated. X-Rays reviewed.  2.  Today we are going to order MRI right foot to rule out tibial sesamoid fracture.  The patient has had this pain for greater than 6 months despite conservative modalities including anti-inflammatories and shoe  gear modification.  Patient also received an injection last visit which helped only temporarily. 3.  Return to clinic after MRI to review results and discuss different treatment options which may potentially include tibial sesamoidectomy      Edrick Kins, DPM [facility]  Dr. Edrick Kins, DPM    [address], [place] [address]                Office [phone]  Fax [phone]

## 2019-12-12 ENCOUNTER — Telehealth: Payer: Self-pay | Admitting: *Deleted

## 2019-12-12 DIAGNOSIS — M2580 Other specified joint disorders, unspecified joint: Secondary | ICD-10-CM

## 2019-12-12 DIAGNOSIS — M7751 Other enthesopathy of right foot: Secondary | ICD-10-CM

## 2019-12-12 DIAGNOSIS — S92811A Other fracture of right foot, initial encounter for closed fracture: Secondary | ICD-10-CM

## 2019-12-12 DIAGNOSIS — S92811G Other fracture of right foot, subsequent encounter for fracture with delayed healing: Secondary | ICD-10-CM

## 2019-12-12 NOTE — Telephone Encounter (Signed)
-----   Message from Edrick Kins, DPM sent at 12/11/2019  6:28 PM EDT ----- Regarding: MRI right forefoot without contrast Please order MRI right forefoot without contrast  Diagnosis: Chronic tibial sesamoiditis versus tibial sesamoid fracture  Thanks, Dr. Amalia Hailey

## 2019-12-24 NOTE — Telephone Encounter (Signed)
[employer] denied MRI right foot, PEER TO PEER is needed for Case: [id number], MRI right foot [id number], call [phone]

## 2019-12-30 ENCOUNTER — Other Ambulatory Visit: Payer: Medicaid Other

## 2020-01-06 NOTE — Telephone Encounter (Signed)
MRI approved. Authorization: [id number]  Thanks, Dr. Amalia Hailey

## 2020-01-06 NOTE — Telephone Encounter (Signed)
Faxed orders with PA to [facility].

## 2020-01-07 ENCOUNTER — Other Ambulatory Visit: Payer: Self-pay | Admitting: Podiatry

## 2020-01-07 DIAGNOSIS — S92811A Other fracture of right foot, initial encounter for closed fracture: Secondary | ICD-10-CM

## 2020-01-14 ENCOUNTER — Inpatient Hospital Stay: Admission: RE | Admit: 2020-01-14 | Payer: Medicaid Other | Source: Ambulatory Visit

## 2020-01-15 ENCOUNTER — Telehealth: Payer: Self-pay | Admitting: *Deleted

## 2020-01-15 NOTE — Telephone Encounter (Signed)
Patient called and stated that she missed her appointment for the MRI due to work and wanted to get another appointment and [place] told her that we would have to call and get an appointment and left a message for the patient that the next appointment is august 20th 2021, at 69 pm. Lattie Haw

## 2020-01-21 ENCOUNTER — Encounter (HOSPITAL_COMMUNITY): Payer: Self-pay

## 2020-01-21 ENCOUNTER — Other Ambulatory Visit: Payer: Self-pay

## 2020-01-21 ENCOUNTER — Emergency Department (HOSPITAL_COMMUNITY)
Admission: EM | Admit: 2020-01-21 | Discharge: 2020-01-21 | Disposition: A | Discharge status: Home / Self Care | Payer: Medicaid Other | Attending: Emergency Medicine | Admitting: Emergency Medicine

## 2020-01-21 DIAGNOSIS — Z7722 Contact with and (suspected) exposure to environmental tobacco smoke (acute) (chronic): Secondary | ICD-10-CM | POA: Insufficient documentation

## 2020-01-21 DIAGNOSIS — Z20822 Contact with and (suspected) exposure to covid-19: Secondary | ICD-10-CM | POA: Insufficient documentation

## 2020-01-21 DIAGNOSIS — Z7984 Long term (current) use of oral hypoglycemic drugs: Secondary | ICD-10-CM | POA: Diagnosis not present

## 2020-01-21 DIAGNOSIS — I10 Essential (primary) hypertension: Secondary | ICD-10-CM | POA: Diagnosis not present

## 2020-01-21 DIAGNOSIS — E1140 Type 2 diabetes mellitus with diabetic neuropathy, unspecified: Secondary | ICD-10-CM | POA: Insufficient documentation

## 2020-01-21 DIAGNOSIS — Z79899 Other long term (current) drug therapy: Secondary | ICD-10-CM | POA: Insufficient documentation

## 2020-01-21 LAB — SARS CORONAVIRUS 2 (TAT 6-24 HRS): SARS Coronavirus 2: NEGATIVE

## 2020-01-21 NOTE — Discharge Instructions (Signed)
Return for breathing difficulties or new concerns. Use Tylenol and Motrin as needed for body aches and fevers. Follow-up results of Covid testing they should call you in the next 24 hours if positive.

## 2020-01-21 NOTE — ED Triage Notes (Signed)
Called sunday that [facility] had covid positive and class was closed, here for covid test,no meds prior to arrival

## 2020-01-21 NOTE — ED Provider Notes (Signed)
[facility] EMERGENCY DEPARTMENT Provider Note   CSN: [id number] Arrival date & time: 01/21/20  1205     History Chief Complaint  Patient presents with  . Covid Exposure    Briana Alvarez is a 59 y.o. female.  Patient with history of high blood pressure and diabetes presents after Covid exposure. Family members were exposed to Covid positive at daycare. Patient has no significant symptoms.        Past Medical History:  Diagnosis Date  . Anxiety   . Arthritis   . Depression   . Diabetes mellitus   . Headache    "2 a month"  . Hypertension   . Migraine   . Neuropathy   . Pneumonia     Patient Active Problem List   Diagnosis Date Noted  . PVC's (premature ventricular contractions) 09/27/2016  . 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 Chest pain 06/28/2016  . Daytime hypersomnia 01/11/2016  . Chronic migraine without aura without status migrainosus, not intractable 12/29/2015  . OSA (obstructive sleep apnea) 12/29/2015  . 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 Plantar fasciitis, bilateral 12/29/2015  . Chronic left-sided low back pain with left-sided sciatica 12/29/2015  . Morbid obesity ([place]) 08/27/2015  . Left low back pain 08/27/2015  . Diabetic peripheral neuropathy associated with type 2 diabetes mellitus ([place]) 04/10/2015  . Lumbar radiculopathy, chronic 02/18/2014  . Hypertriglyceridemia 01/08/2014  . Type II diabetes mellitus, well controlled ([place]) 07/12/2013  . Insomnia 07/12/2013  . Depression 07/12/2013  . Essential hypertension, benign 07/12/2013  . Peripheral nerve disease 01/16/2013  . Peripheral neuropathy 01/16/2013  . Ankle sprain 11/30/2012  . Bipolar affective disorder ([place]) 10/12/2012  . Essential hypertension 10/12/2012  . Diabetes mellitus ([place]) 10/12/2012    Past Surgical History:  Procedure Laterality Date  . ABDOMINAL HYSTERECTOMY    . BREAST EXCISIONAL BIOPSY  2001   LEFT BREAST  . CARPAL TUNNEL RELEASE    . KNEE  ARTHROSCOPY Right   . KNEE SURGERY Left   . NECK SURGERY    . SHOULDER ARTHROSCOPY    . SHOULDER ARTHROSCOPY WITH SUBACROMIAL DECOMPRESSION AND BICEP TENDON REPAIR Right 02/15/2016   Procedure: SHOULDER DIAGNOSTIC OPERATIVE ARTHROSCOPY WITH SUBACROMIAL DECOMPRESSION AND DISTAL CLAVICLE EXCISION;  Surgeon: Meredith Pel, MD;  Location: [facility];  Service: Orthopedics;  Laterality: Right;  . TONSILLECTOMY    . ulner nerve     . V TACH ABLATION N/A 09/27/2016   Procedure: V Tach Ablation;  Surgeon: [name], MD;  Location: [facility];  Service: Cardiovascular;  Laterality: N/A;     OB History   No obstetric history on file.     Family History  Problem Relation Age of Onset  . Cancer Mother   . Hypertension Mother   . Breast cancer Mother   . Diabetes Daughter   . Diabetes Maternal Grandmother   . Hypertension Maternal Grandmother   . Heart attack Neg Hx   . Stroke Neg Hx     Social History   Tobacco Use  . Smoking status: Passive Smoke Exposure - Never Smoker  . Smokeless tobacco: Never Used  Vaping Use  . Vaping Use: Never used  Substance Use Topics  . Alcohol use: No  . Drug use: No    Home Medications Prior to Admission medications   Medication Sig Start Date End Date Taking? Authorizing Provider  ACCU-CHEK AVIVA PLUS test strip USE TO MONITOR BLOOD GLUCOSE 2 TIME(S) DAILY DX CODE E11.9 12/31/18  [provider]  Accu-Chek Softclix Lancets lancets See admin instructions. 01/27/19   [provider]  acetaminophen (TYLENOL) 500 MG tablet Take 1,000 mg by mouth every 6 (six) hours as needed for headache (pain).    [provider]  albuterol (VENTOLIN HFA) 108 (90 Base) MCG/ACT inhaler Inhale 2 puffs into the lungs every 6 (six) hours as needed for wheezing or shortness of breath. 10/04/18   Jettie Booze, MD  azelastine (ASTELIN) 0.1 % nasal spray 2 SPRAYS EACH NOSTRIL FOUR TIMES DAILY FOR 3 5 DAYS THEN TWICE A DAY 09/18/18    [provider]  B-D UF III MINI PEN NEEDLES 31G X 5 MM MISC See admin instructions. 09/22/18   [provider]  benazepril (LOTENSIN) 20 MG tablet TAKE 1 TABLET BY MOUTH EVERY DAY 10/29/19   Jettie Booze, MD  Blood Glucose Monitoring Suppl (ACCU-CHEK AVIVA PLUS) w/Device KIT by Does not apply route. 10/26/18   [provider]  BYETTA 5 MCG PEN 5 MCG/0.02ML SOPN injection Inject 5 mcg as directed 2 (two) times daily before a meal. 08/01/17   [provider]  cetirizine (ZYRTEC) 10 MG tablet Take by mouth. 02/08/18   [provider]  doxycycline (VIBRA-TABS) 100 MG tablet Take 1 tablet (100 mg total) by mouth 2 (two) times daily. 02/13/19   Edrick Kins, DPM  gabapentin (NEURONTIN) 300 MG capsule TAKE 3 CAPSULES BY MOUTH 3 TIMES A DAY 12/14/15   Funches, Josalyn, MD  hydrochlorothiazide (HYDRODIURIL) 25 MG tablet TAKE ONE TABLET (25 MG DOSE) BY MOUTH DAILY. 10/20/17   [provider]  metFORMIN (GLUCOPHAGE) 1000 MG tablet TAKE 1 TABLET BY MOUTH 2 TIMES DAILY WITH A MEAL. 09/01/15   Boykin Nearing, MD  methylPREDNISolone (MEDROL) 4 MG tablet TAKE 1 TABLET EVERY DAY FOR 14 DAYS,THEN 1/2 TABLET EVERY DAY FOR 14 DAYS 09/30/19   Jessy Oto, MD  metoprolol tartrate (LOPRESSOR) 25 MG tablet Take 1 tablet by mouth 2 hours prior to Cardiac CT 09/09/19   Jettie Booze, MD  Multiple Vitamin (MULTIVITAMIN WITH MINERALS) TABS tablet Take 1 tablet by mouth daily. Women's One a Day 53 +    [provider]  NON FORMULARY [facility]  Peripheral Neuropathy Cream- Bupivacaine 1%, Doxepin 3%, Gabapentin 6%, Pentoxifylline 3%, Topiramate 1% Apply 1-2 grams to affected area 3-4 times daily Qty. 120 gm 3 refills    [provider]  rosuvastatin (CRESTOR) 20 MG tablet Take 1 tablet (20 mg total) by mouth daily. 10/23/19   Jettie Booze, MD  sertraline (ZOLOFT) 50 MG tablet Take 50 mg by mouth daily. 08/22/17   [provider]    Allergies    Shrimp [shellfish allergy]  Review of Systems   Review of Systems  Constitutional: Negative for chills and fever.  HENT: Positive for congestion.   Eyes: Negative for visual disturbance.  Respiratory: Negative for shortness of breath.   Cardiovascular: Negative for chest pain.  Gastrointestinal: Negative for abdominal pain and vomiting.  Genitourinary: Negative for dysuria and flank pain.  Musculoskeletal: Negative for back pain, neck pain and neck stiffness.  Skin: Negative for rash.  Neurological: Negative for light-headedness and headaches.    Physical Exam Updated Vital Signs BP 101/66 (BP Location: Left Arm)   Pulse 75   Temp 99.1 F (37.3 C) (Temporal)   Resp 18   LMP  (LMP Unknown)   SpO2 100%   Physical Exam Vitals and nursing note reviewed.  Constitutional:      Appearance: She is well-developed.  HENT:     Head: Normocephalic and atraumatic.  Eyes:     General:        Right eye: No discharge.        Left eye: No discharge.     Conjunctiva/sclera: Conjunctivae normal.  Neck:     Trachea: No tracheal deviation.  Cardiovascular:     Rate and Rhythm: Normal rate and regular rhythm.  Pulmonary:     Effort: Pulmonary effort is normal.     Breath sounds: Normal breath sounds.  Abdominal:     General: There is no distension.     Palpations: Abdomen is soft.     Tenderness: There is no abdominal tenderness. There is no guarding.  Musculoskeletal:     Cervical back: Normal range of motion and neck supple.  Skin:    General: Skin is warm.     Findings: No rash.  Neurological:     Mental Status: She is alert and oriented to person, place, and time.     ED Results / Procedures / Treatments   Labs (all labs ordered are listed, but only abnormal results are displayed) Labs Reviewed  SARS CORONAVIRUS 2 (TAT 6-24 HRS)    EKG None  Radiology No results found.  Procedures Procedures (including critical care time)  Medications  Ordered in ED Medications - No data to display  ED Course  I have reviewed the triage vital signs and the nursing notes.  Pertinent labs & imaging results that were available during my care of the patient were reviewed by me and considered in my medical decision making (see chart for details).    MDM Rules/Calculators/A&P                          Patient presents with family for Covid exposure. Patient has no significant symptoms at this time. Oxygenation normal. Supportive care and follow-up of Covid test discussed.  Final Clinical Impression(s) / ED Diagnoses Final diagnoses:  Close exposure to COVID-19 virus    Rx / DC Orders ED Discharge Orders    None       Elnora Morrison, MD 01/21/20 1302

## 2020-01-23 ENCOUNTER — Other Ambulatory Visit: Payer: Medicaid Other

## 2020-01-31 ENCOUNTER — Ambulatory Visit: Payer: Medicaid Other | Admitting: Specialist

## 2020-02-07 ENCOUNTER — Other Ambulatory Visit: Payer: Medicaid Other

## 2020-02-11 ENCOUNTER — Telehealth: Payer: Self-pay | Admitting: Podiatry

## 2020-02-11 NOTE — Telephone Encounter (Signed)
PT called and stated she did not make her MRI appointment. She needs the appt rescheduled. The imaging center said we would need to get that rescheduled for her.

## 2020-02-21 ENCOUNTER — Telehealth: Payer: Self-pay | Admitting: Podiatry

## 2020-02-21 NOTE — Telephone Encounter (Signed)
Pt called back because she has not heard about rescheduling her MRI that she missed. [facility] told her that we need to reschedule it. Please call patient back.

## 2020-02-25 ENCOUNTER — Other Ambulatory Visit: Payer: Self-pay | Admitting: Podiatry

## 2020-02-25 DIAGNOSIS — S92811A Other fracture of right foot, initial encounter for closed fracture: Secondary | ICD-10-CM

## 2020-03-09 ENCOUNTER — Other Ambulatory Visit: Payer: Self-pay | Admitting: Podiatry

## 2020-03-10 ENCOUNTER — Other Ambulatory Visit: Payer: Medicaid Other

## 2020-03-10 DIAGNOSIS — Z20822 Contact with and (suspected) exposure to covid-19: Secondary | ICD-10-CM

## 2020-03-12 LAB — SARS-COV-2, NAA 2 DAY TAT

## 2020-03-12 LAB — NOVEL CORONAVIRUS, NAA: SARS-CoV-2, NAA: NOT DETECTED

## 2020-03-15 ENCOUNTER — Ambulatory Visit
Admission: RE | Admit: 2020-03-15 | Discharge: 2020-03-15 | Disposition: A | Discharge status: Home / Self Care | Payer: Medicaid Other | Source: Ambulatory Visit | Attending: Podiatry | Admitting: Podiatry

## 2020-03-15 DIAGNOSIS — S92811A Other fracture of right foot, initial encounter for closed fracture: Secondary | ICD-10-CM

## 2020-03-30 ENCOUNTER — Ambulatory Visit (INDEPENDENT_AMBULATORY_CARE_PROVIDER_SITE_OTHER): Payer: Medicaid Other | Admitting: Podiatry

## 2020-03-30 ENCOUNTER — Other Ambulatory Visit: Payer: Self-pay

## 2020-03-30 DIAGNOSIS — L909 Atrophic disorder of skin, unspecified: Secondary | ICD-10-CM | POA: Diagnosis not present

## 2020-03-30 DIAGNOSIS — L600 Ingrowing nail: Secondary | ICD-10-CM | POA: Diagnosis not present

## 2020-03-30 DIAGNOSIS — M7751 Other enthesopathy of right foot: Secondary | ICD-10-CM

## 2020-03-30 DIAGNOSIS — L603 Nail dystrophy: Secondary | ICD-10-CM

## 2020-03-30 DIAGNOSIS — M2580 Other specified joint disorders, unspecified joint: Secondary | ICD-10-CM | POA: Diagnosis not present

## 2020-03-30 MED ORDER — GENTAMICIN SULFATE 0.1 % EX CREA
1.0000 | TOPICAL_CREAM | Freq: Two times a day (BID) | CUTANEOUS | 1 refills | Status: AC
Start: 2020-03-30 — End: ?

## 2020-03-30 NOTE — Patient Instructions (Signed)

## 2020-03-30 NOTE — Progress Notes (Addendum)
   HPI: 59 y.o. female presenting today for evaluation of chronic severe right foot pain that has been ongoing for the past 6 months now.  Patient was last seen in the office on 05/06/2019.  Patient states that shortly after she began to experience pain and tenderness to the bilateral great toes, right greater than the left.  Patient believes she has possibly a bunion deformity and it is very painful despite shoe gear modifications.  Patient states that she is unable to ambulate for any significant amount of time without severe pain.  Patient denies injury.  She presents for further treatment evaluation  Past Medical History:  Diagnosis Date  . Anxiety   . Arthritis   . Depression   . Diabetes mellitus   . Headache    "2 a month"  . Hypertension   . Migraine   . Neuropathy   . Pneumonia      Physical Exam: General: The patient is alert and oriented x3 in no acute distress.  Dermatology: Skin is warm, dry and supple bilateral lower extremities. Negative for open lesions or macerations.  Hyperkeratotic dystrophic nail noted to the right hallux nail plate  Vascular: Palpable pedal pulses bilaterally. No edema or erythema noted. Capillary refill within normal limits.  Neurological: Epicritic and protective threshold diminished bilaterally.   Musculoskeletal Exam: Range of motion within normal limits to all pedal and ankle joints bilateral. Muscle strength 5/5 in all groups bilateral.  Significant tenderness to palpation overlying the tibial sesamoid of the right foot more so than the left foot.  MRI impression 03/15/2020: 1. Surgical changes related to a remote healed distal fifth metatarsal fracture. 2. No stress fracture, bone lesion or AVN. 3. Diffuse fatty atrophy of the foot musculature but no acute muscle injury.  Assessment: 1.  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  Dystrophic nail right hallux nail plate   Plan of Care:  1. Patient evaluated.  MRI  reviewed.  2.  At the moment we will continue conservative care. Offloading felt dancer's pads applied to the insoles of her composite toe shoes.  3.  Recommend shoes with a stiff soled that do not cause as much bending at the toe joint.  Currently the work shoes that she has are very flexible and not stiff 4.  Today we discussed the possibility of permanently removing the right hallux nail plate.  She does have history of total nail matricectomy to the left hallux nail plate with good success.  She would like to have it performed to the right hallux nail plate today as well.  The toe was prepped in aseptic manner and 3 mL of 2% lidocaine plain was infiltrated in a digital block fashion 5.  Nail was avulsed in toto followed by 3 x 01-BPZWCH application of phenol and alcohol flush.  Dry sterile dressing was applied.  Post care instructions were provided. 6.  Return to clinic in 3 weeks     Edrick Kins, DPM [facility]  Dr. Edrick Kins, DPM    [address], [place] [address]                Office [phone]  Fax [phone]

## 2020-04-20 ENCOUNTER — Other Ambulatory Visit: Payer: Self-pay

## 2020-04-20 ENCOUNTER — Ambulatory Visit (INDEPENDENT_AMBULATORY_CARE_PROVIDER_SITE_OTHER): Payer: Medicaid Other | Admitting: Podiatry

## 2020-04-20 DIAGNOSIS — L603 Nail dystrophy: Secondary | ICD-10-CM

## 2020-04-20 NOTE — Progress Notes (Signed)
   Subjective: 59 y.o. female presents today status post permanent nail avulsion procedure of the right hallux that was performed on 03/30/2020.  Patient states that she feels much better.  The toes going great.  She has been soaking her foot and applying the antibiotic cream as directed..   Past Medical History:  Diagnosis Date  . Anxiety   . Arthritis   . Depression   . Diabetes mellitus   . Headache    "2 a month"  . Hypertension   . Migraine   . Neuropathy   . Pneumonia     Objective: Skin is warm, dry and supple.  Nail bed appears to be healing appropriately. Open wound to the associated nail bed with a granular wound base and moderate amount of fibrotic tissue. Minimal drainage noted. Mild erythema around the periungual region likely due to phenol chemical matricectomy.  Assessment: #1 postop permanent total nail avulsion right hallux #2 open wound periungual nail fold of respective digit.   Plan of care: #1 patient was evaluated  #2 debridement of open wound was performed to the periungual border of the respective toe using a currette. Antibiotic ointment and Band-Aid was applied. #3  Continue antibiotic cream and a Band-Aid daily  #4 return to clinic as needed patient is to return to clinic on a PRN basis.   Edrick Kins, DPM [facility]  Dr. Edrick Kins, [place]                                        [place], [place] [address]                Office [phone]  Fax [phone]

## 2020-05-04 ENCOUNTER — Ambulatory Visit: Payer: Medicaid Other | Admitting: Specialist

## 2020-06-14 ENCOUNTER — Other Ambulatory Visit: Payer: Self-pay

## 2020-06-14 ENCOUNTER — Encounter (HOSPITAL_COMMUNITY): Payer: Self-pay

## 2020-06-14 ENCOUNTER — Emergency Department (HOSPITAL_COMMUNITY)
Admission: EM | Admit: 2020-06-14 | Discharge: 2020-06-14 | Disposition: A | Discharge status: Home / Self Care | Payer: Medicaid Other | Attending: Emergency Medicine | Admitting: Emergency Medicine

## 2020-06-14 ENCOUNTER — Emergency Department (HOSPITAL_COMMUNITY): Payer: Medicaid Other

## 2020-06-14 DIAGNOSIS — E1140 Type 2 diabetes mellitus with diabetic neuropathy, unspecified: Secondary | ICD-10-CM | POA: Insufficient documentation

## 2020-06-14 DIAGNOSIS — J069 Acute upper respiratory infection, unspecified: Secondary | ICD-10-CM | POA: Insufficient documentation

## 2020-06-14 DIAGNOSIS — Z79899 Other long term (current) drug therapy: Secondary | ICD-10-CM | POA: Insufficient documentation

## 2020-06-14 DIAGNOSIS — Z20822 Contact with and (suspected) exposure to covid-19: Secondary | ICD-10-CM | POA: Diagnosis not present

## 2020-06-14 DIAGNOSIS — I10 Essential (primary) hypertension: Secondary | ICD-10-CM | POA: Insufficient documentation

## 2020-06-14 DIAGNOSIS — Z7722 Contact with and (suspected) exposure to environmental tobacco smoke (acute) (chronic): Secondary | ICD-10-CM | POA: Insufficient documentation

## 2020-06-14 DIAGNOSIS — R059 Cough, unspecified: Secondary | ICD-10-CM | POA: Diagnosis present

## 2020-06-14 DIAGNOSIS — Z7984 Long term (current) use of oral hypoglycemic drugs: Secondary | ICD-10-CM | POA: Insufficient documentation

## 2020-06-14 DIAGNOSIS — R197 Diarrhea, unspecified: Secondary | ICD-10-CM | POA: Diagnosis not present

## 2020-06-14 DIAGNOSIS — L03032 Cellulitis of left toe: Secondary | ICD-10-CM | POA: Insufficient documentation

## 2020-06-14 LAB — RESP PANEL BY RT-PCR (FLU A&B, COVID) ARPGX2
Influenza A by PCR: NEGATIVE
Influenza B by PCR: NEGATIVE
SARS Coronavirus 2 by RT PCR: NEGATIVE

## 2020-06-14 MED ORDER — DOXYCYCLINE HYCLATE 100 MG PO CAPS: 100.0000 mg | ORAL_CAPSULE | Freq: Two times a day (BID) | ORAL | 0 refills | Status: AC

## 2020-06-14 MED ORDER — GUAIFENESIN 100 MG/5ML PO SYRP: 100.0000 mg | ORAL_SOLUTION | ORAL | 0 refills | Status: AC | PRN

## 2020-06-14 MED ORDER — GUAIFENESIN 100 MG/5ML PO SYRP: 100.0000 mg | ORAL_SOLUTION | ORAL | 0 refills | Status: DC | PRN

## 2020-06-14 MED ORDER — DOXYCYCLINE HYCLATE 100 MG PO CAPS: 100.0000 mg | ORAL_CAPSULE | Freq: Two times a day (BID) | ORAL | 0 refills | Status: DC

## 2020-06-14 NOTE — ED Notes (Signed)
The pt has been ill since Thursday with body aches cough non-productive  No temp  Diarrhea  No n or v  No headache  Non- smoker

## 2020-06-14 NOTE — ED Provider Notes (Signed)
[facility] EMERGENCY DEPARTMENT Provider Note   CSN: [id number] Arrival date & time: 06/14/20  1301     History No chief complaint on file.   Briana Alvarez is a 59 y.o. female.  HPI Patient is a 59 year old female with a history of DM, HTN who presents to ED with complaints of URI symptoms.  Patient states that starting Thursday (3 days ago), she has been having a cough, runny nose, sore throat, body aches.  No fevers.  Patient has also had intermittent diarrhea, approximately 4 episodes today.  She also notes that several days ago her young nephew accidentally stepped on her left second toe and since that time has noted redness and pain to the area; she is concerned that the toe may be infected.  Patient has been taking Mucinex and DayQuil with some relief of her symptoms.    Past Medical History:  Diagnosis Date  . Anxiety   . Arthritis   . Depression   . Diabetes mellitus   . Headache    "2 a month"  . Hypertension   . Migraine   . Neuropathy   . Pneumonia     Patient Active Problem List   Diagnosis Date Noted  . PVC's (premature ventricular contractions) 09/27/2016  . 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 Chest pain 06/28/2016  . Daytime hypersomnia 01/11/2016  . Chronic migraine without aura without status migrainosus, not intractable 12/29/2015  . OSA (obstructive sleep apnea) 12/29/2015  . 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 Plantar fasciitis, bilateral 12/29/2015  . Chronic left-sided low back pain with left-sided sciatica 12/29/2015  . Morbid obesity ([facility]) 08/27/2015  . Left low back pain 08/27/2015  . Diabetic peripheral neuropathy associated with type 2 diabetes mellitus ([place]) 04/10/2015  . Lumbar radiculopathy, chronic 02/18/2014  . Hypertriglyceridemia 01/08/2014  . Type II diabetes mellitus, well controlled ([place]) 07/12/2013  . Insomnia 07/12/2013  . Depression 07/12/2013  . Essential hypertension, benign 07/12/2013  . Peripheral  nerve disease 01/16/2013  . Peripheral neuropathy 01/16/2013  . Ankle sprain 11/30/2012  . Bipolar affective disorder ([place]) 10/12/2012  . Essential hypertension 10/12/2012  . Diabetes mellitus ([place]) 10/12/2012    Past Surgical History:  Procedure Laterality Date  . ABDOMINAL HYSTERECTOMY    . BREAST EXCISIONAL BIOPSY  2001   LEFT BREAST  . CARPAL TUNNEL RELEASE    . KNEE ARTHROSCOPY Right   . KNEE SURGERY Left   . NECK SURGERY    . SHOULDER ARTHROSCOPY    . SHOULDER ARTHROSCOPY WITH SUBACROMIAL DECOMPRESSION AND BICEP TENDON REPAIR Right 02/15/2016   Procedure: SHOULDER DIAGNOSTIC OPERATIVE ARTHROSCOPY WITH SUBACROMIAL DECOMPRESSION AND DISTAL CLAVICLE EXCISION;  Surgeon: Meredith Pel, MD;  Location: [facility];  Service: Orthopedics;  Laterality: Right;  . TONSILLECTOMY    . ulner nerve     . V TACH ABLATION N/A 09/27/2016   Procedure: V Tach Ablation;  Surgeon: [name], MD;  Location: [facility];  Service: Cardiovascular;  Laterality: N/A;     OB History   No obstetric history on file.     Family History  Problem Relation Age of Onset  . Cancer Mother   . Hypertension Mother   . Breast cancer Mother   . Diabetes Daughter   . Diabetes Maternal Grandmother   . Hypertension Maternal Grandmother   . Heart attack Neg Hx   . Stroke Neg Hx     Social History   Tobacco Use  . Smoking status: Passive Smoke Exposure -  Never Smoker  . Smokeless tobacco: Never Used  Vaping Use  . Vaping Use: Never used  Substance Use Topics  . Alcohol use: No  . Drug use: No    Home Medications Prior to Admission medications   Medication Sig Start Date End Date Taking? Authorizing Provider  ACCU-CHEK AVIVA PLUS test strip USE TO MONITOR BLOOD GLUCOSE 2 TIME(S) DAILY DX CODE E11.9 12/31/18   [provider]  Accu-Chek Softclix Lancets lancets See admin instructions. 01/27/19   [provider]  acetaminophen (TYLENOL) 500 MG tablet Take 1,000 mg by mouth  every 6 (six) hours as needed for headache (pain).    [provider]  albuterol (VENTOLIN HFA) 108 (90 Base) MCG/ACT inhaler Inhale 2 puffs into the lungs every 6 (six) hours as needed for wheezing or shortness of breath. 10/04/18   Jettie Booze, MD  azelastine (ASTELIN) 0.1 % nasal spray 2 SPRAYS EACH NOSTRIL FOUR TIMES DAILY FOR 3 5 DAYS THEN TWICE A DAY 09/18/18   [provider]  B-D UF III MINI PEN NEEDLES 31G X 5 MM MISC See admin instructions. 09/22/18   [provider]  benazepril (LOTENSIN) 20 MG tablet TAKE 1 TABLET BY MOUTH EVERY DAY 10/29/19   Jettie Booze, MD  Blood Glucose Monitoring Suppl (ACCU-CHEK AVIVA PLUS) w/Device KIT by Does not apply route. 10/26/18   [provider]  BYETTA 5 MCG PEN 5 MCG/0.02ML SOPN injection Inject 5 mcg as directed 2 (two) times daily before a meal. 08/01/17   [provider]  cetirizine (ZYRTEC) 10 MG tablet Take by mouth. 02/08/18   [provider]  doxycycline (VIBRA-TABS) 100 MG tablet Take 1 tablet (100 mg total) by mouth 2 (two) times daily. 02/13/19   Edrick Kins, DPM  gabapentin (NEURONTIN) 300 MG capsule TAKE 3 CAPSULES BY MOUTH 3 TIMES A DAY 12/14/15   Funches, Adriana Mccallum, MD  gentamicin cream (GARAMYCIN) 0.1 % Apply 1 application topically 2 (two) times daily. 03/30/20   Edrick Kins, DPM  hydrochlorothiazide (HYDRODIURIL) 25 MG tablet TAKE ONE TABLET (25 MG DOSE) BY MOUTH DAILY. 10/20/17   [provider]  metFORMIN (GLUCOPHAGE) 1000 MG tablet TAKE 1 TABLET BY MOUTH 2 TIMES DAILY WITH A MEAL. 09/01/15   Boykin Nearing, MD  methylPREDNISolone (MEDROL) 4 MG tablet TAKE 1 TABLET EVERY DAY FOR 14 DAYS,THEN 1/2 TABLET EVERY DAY FOR 14 DAYS 09/30/19   Jessy Oto, MD  metoprolol tartrate (LOPRESSOR) 25 MG tablet Take 1 tablet by mouth 2 hours prior to Cardiac CT 09/09/19   Jettie Booze, MD  Multiple Vitamin (MULTIVITAMIN WITH MINERALS) TABS tablet Take 1 tablet by mouth  daily. Women's One a Day 16 +    [provider]  NON FORMULARY [facility]  Peripheral Neuropathy Cream- Bupivacaine 1%, Doxepin 3%, Gabapentin 6%, Pentoxifylline 3%, Topiramate 1% Apply 1-2 grams to affected area 3-4 times daily Qty. 120 gm 3 refills    [provider]  rosuvastatin (CRESTOR) 20 MG tablet Take 1 tablet (20 mg total) by mouth daily. 10/23/19   Jettie Booze, MD  sertraline (ZOLOFT) 50 MG tablet Take 50 mg by mouth daily. 08/22/17   [provider]    Allergies    Shrimp [shellfish allergy]  Review of Systems   Review of Systems  Constitutional: Negative for chills and fever.  HENT: Positive for rhinorrhea and sore throat.   Eyes: Negative for pain and visual disturbance.  Respiratory: Positive for cough.  Cardiovascular: Negative for chest pain and leg swelling.  Gastrointestinal: Positive for diarrhea. Negative for abdominal pain and vomiting.  Genitourinary: Negative for dysuria and hematuria.  Musculoskeletal: Positive for myalgias. Negative for gait problem and joint swelling.  Skin: Positive for wound (L 2nd toe). Negative for color change and rash.  Neurological: Negative for syncope and light-headedness.  Psychiatric/Behavioral: Negative for confusion and suicidal ideas.  All other systems reviewed and are negative.   Physical Exam Updated Vital Signs BP 118/77 (BP Location: Right Arm)   Pulse 60   Temp 98.2 F (36.8 C) (Oral)   Resp 18   LMP  (LMP Unknown)   SpO2 100%   Physical Exam Vitals and nursing note reviewed.  Constitutional:      General: She is not in acute distress.    Appearance: She is well-developed and well-nourished. She is ill-appearing. She is not toxic-appearing.  HENT:     Head: Normocephalic and atraumatic.     Nose: Nose normal. No congestion.     Mouth/Throat:     Mouth: Mucous membranes are moist.     Pharynx: Oropharynx is clear.  Eyes:     General: No scleral icterus.     Extraocular Movements: Extraocular movements intact.  Cardiovascular:     Rate and Rhythm: Normal rate and regular rhythm.     Pulses: Normal pulses.     Heart sounds: No murmur heard. No friction rub. No gallop.   Pulmonary:     Effort: Pulmonary effort is normal. No respiratory distress.     Breath sounds: Normal breath sounds. No wheezing, rhonchi or rales.  Abdominal:     Palpations: Abdomen is soft.     Tenderness: There is no abdominal tenderness. There is no guarding or rebound.  Musculoskeletal:        General: No edema.     Cervical back: Neck supple.     Right lower leg: No edema.     Left lower leg: No edema.  Skin:    General: Skin is warm and dry.  Neurological:     Mental Status: She is alert.     Comments: Alert, grossly oriented, moves all extremities spontaneously  Psychiatric:        Mood and Affect: Mood and affect normal.     ED Results / Procedures / Treatments   Labs (all labs ordered are listed, but only abnormal results are displayed) Labs Reviewed  RESP PANEL BY RT-PCR (FLU A&B, COVID) [id number]   Radiology DG Chest Portable 1 View  Result Date: 06/14/2020 CLINICAL DATA:  Cough. EXAM: PORTABLE CHEST 1 VIEW COMPARISON:  03/06/2014 FINDINGS: Lordotic technique is demonstrated. Lungs are well inflated without consolidation or effusion. Cardiomediastinal silhouette and remainder of the exam is unchanged. IMPRESSION: No active disease. Electronically Signed   By: Marin Olp M.D.   On: 06/14/2020 19:05   DG Foot Complete Left  Result Date: 06/14/2020 CLINICAL DATA:  Left second toe injury, possible skin infection. EXAM: LEFT FOOT - COMPLETE 3+ VIEW COMPARISON:  12/11/2019 FINDINGS: Old right fifth metatarsal fracture post fixation with hardware intact and unchanged. No evidence of acute fracture or dislocation. No air in the soft tissues. No evidence of bone destruction. Small inferior calcaneal spur. IMPRESSION: 1. No acute findings. 2. Old right fifth  metatarsal fracture post fixation. Electronically Signed   By: Marin Olp M.D.   On: 06/14/2020 19:07    Procedures Procedures (including critical care time)  ED Course  I have reviewed the  triage vital signs and the nursing notes.  Pertinent labs & imaging results that were available during my care of the patient were reviewed by me and considered in my medical decision making (see chart for details).    MDM Rules/Calculators/A&P                          59 year old female with a history of multiple days URI symptoms, possible left second toe infection.  Patient is very well-appearing and hemodynamically stable on initial exam.  Lungs are clear and COVID-19/flu testing is negative.  No evidence of tonsillitis or peritonsillar abscess.  I am most concerned for nonspecific viral illness.  The left toe is concerning for developing cellulitis vs paronychia, possible osteomyelitis in context of pt's history diabetes. XR of the L foot and CXR ordered on arrival. Reviewed pt's negative testing results with her.  Imaging interpreted by radiology and images personally reviewed. XR L foot shows no acute findings including no evidence of bony destruction; there is old L 5th metatarsal fracture s/p fixation. CXR without evidence of acute disease process.  Will provide 7-day course of doxycycline to treat for cellulitis and robitussin to alleviate cough as pt requesting cough syrup. Encouraged pt to continue close monitoring of her feet and daily foot checks. Pt already has an appointment scheduled tomorrow with her PCP and says she will follow up.  At this time, pt appears safe for discharge with strict return precautions provided and instructions for follow up given. Pt voiced understanding and is amenable to this plan. Pt discharged in stable condition.   Final Clinical Impression(s) / ED Diagnoses Final diagnoses:  Upper respiratory tract infection, unspecified type  Cellulitis of toe of left  foot    Rx / DC Orders ED Discharge Orders         Ordered    doxycycline (VIBRAMYCIN) 100 MG capsule  2 times daily        06/14/20 2006    guaifenesin (ROBITUSSIN) 100 MG/5ML syrup  Every 4 hours PRN        06/14/20 Sharman Cheek, MD 60/60/04 [id number]    Blanchie Dessert, MD 06/15/20 2202

## 2020-06-14 NOTE — Discharge Instructions (Signed)
Thank you for choosing [facility] for your care today.  To do: I have included a prescription for doxycycline, which you will need to take for the next week to help prevent worsening of skin infection in your toe.  You will also need to see your primary care doctor and/or podiatry to make sure that the infection is improving.  Continue daily careful foot checks after bath/shower to look for any cuts, ulcers, or other skin abnormalities on the feet. Please follow up with your primary care doctor in the next few days. If you do not have a PCP you are established with, you can call [phone]  or [phone]  to access [facility] service. You can also visit [email or web address] Please come back to the Emergency Department if you have shortness of breath, chest pain, confusion/mental status changes, if you have so much nausea/vomiting that you cannot keep down fluids, or if you have any other symptoms that worry you.  Take care. Hope you start feeling better soon.  Vanna Scotland, MD Emergency Medicine

## 2020-06-14 NOTE — ED Triage Notes (Signed)
Patient complains of congestion, body aches and diarrhea x 3 days, has only received 1 covid vaccine

## 2020-07-25 ENCOUNTER — Other Ambulatory Visit: Payer: Self-pay | Admitting: Interventional Cardiology

## 2020-11-04 ENCOUNTER — Emergency Department (HOSPITAL_COMMUNITY)
Admission: EM | Admit: 2020-11-04 | Discharge: 2020-11-05 | Disposition: A | Discharge status: Home / Self Care | Payer: Medicaid Other | Attending: Emergency Medicine | Admitting: Emergency Medicine

## 2020-11-04 ENCOUNTER — Encounter (HOSPITAL_COMMUNITY): Payer: Self-pay

## 2020-11-04 ENCOUNTER — Other Ambulatory Visit: Payer: Self-pay

## 2020-11-04 DIAGNOSIS — E1142 Type 2 diabetes mellitus with diabetic polyneuropathy: Secondary | ICD-10-CM | POA: Insufficient documentation

## 2020-11-04 DIAGNOSIS — J019 Acute sinusitis, unspecified: Secondary | ICD-10-CM | POA: Diagnosis not present

## 2020-11-04 DIAGNOSIS — Z7984 Long term (current) use of oral hypoglycemic drugs: Secondary | ICD-10-CM | POA: Diagnosis not present

## 2020-11-04 DIAGNOSIS — Z79899 Other long term (current) drug therapy: Secondary | ICD-10-CM | POA: Insufficient documentation

## 2020-11-04 DIAGNOSIS — R7989 Other specified abnormal findings of blood chemistry: Secondary | ICD-10-CM | POA: Diagnosis not present

## 2020-11-04 DIAGNOSIS — J329 Chronic sinusitis, unspecified: Secondary | ICD-10-CM

## 2020-11-04 DIAGNOSIS — Z20822 Contact with and (suspected) exposure to covid-19: Secondary | ICD-10-CM | POA: Insufficient documentation

## 2020-11-04 DIAGNOSIS — I10 Essential (primary) hypertension: Secondary | ICD-10-CM | POA: Insufficient documentation

## 2020-11-04 DIAGNOSIS — Z794 Long term (current) use of insulin: Secondary | ICD-10-CM | POA: Insufficient documentation

## 2020-11-04 DIAGNOSIS — Z7722 Contact with and (suspected) exposure to environmental tobacco smoke (acute) (chronic): Secondary | ICD-10-CM | POA: Insufficient documentation

## 2020-11-04 DIAGNOSIS — R519 Headache, unspecified: Secondary | ICD-10-CM | POA: Diagnosis present

## 2020-11-04 LAB — CBC WITH DIFFERENTIAL/PLATELET
Abs Immature Granulocytes: 0.02 10*3/uL (ref 0.00–0.07)
Basophils Absolute: 0 10*3/uL (ref 0.0–0.1)
Basophils Relative: 0 %
Eosinophils Absolute: 0.1 10*3/uL (ref 0.0–0.5)
Eosinophils Relative: 1 %
HCT: 34.7 % — ABNORMAL LOW (ref 36.0–46.0)
Hemoglobin: 11.1 g/dL — ABNORMAL LOW (ref 12.0–15.0)
Immature Granulocytes: 0 %
Lymphocytes Relative: 31 %
Lymphs Abs: 2.8 10*3/uL (ref 0.7–4.0)
MCH: 30.2 pg (ref 26.0–34.0)
MCHC: 32 g/dL (ref 30.0–36.0)
MCV: 94.3 fL (ref 80.0–100.0)
Monocytes Absolute: 0.7 10*3/uL (ref 0.1–1.0)
Monocytes Relative: 8 %
Neutro Abs: 5.3 10*3/uL (ref 1.7–7.7)
Neutrophils Relative %: 60 %
Platelets: 177 10*3/uL (ref 150–400)
RBC: 3.68 MIL/uL — ABNORMAL LOW (ref 3.87–5.11)
RDW: 15.2 % (ref 11.5–15.5)
WBC: 8.9 10*3/uL (ref 4.0–10.5)
nRBC: 0 % (ref 0.0–0.2)

## 2020-11-04 LAB — COMPREHENSIVE METABOLIC PANEL
ALT: 16 U/L (ref 0–44)
AST: 27 U/L (ref 15–41)
Albumin: 3.7 g/dL (ref 3.5–5.0)
Alkaline Phosphatase: 60 U/L (ref 38–126)
Anion gap: 5 (ref 5–15)
BUN: 13 mg/dL (ref 6–20)
CO2: 27 mmol/L (ref 22–32)
Calcium: 9 mg/dL (ref 8.9–10.3)
Chloride: 104 mmol/L (ref 98–111)
Creatinine, Ser: 1.44 mg/dL — ABNORMAL HIGH (ref 0.44–1.00)
GFR, Estimated: 42 mL/min — ABNORMAL LOW (ref >60)
Glucose, Bld: 107 mg/dL — ABNORMAL HIGH (ref 70–99)
Potassium: 3.9 mmol/L (ref 3.5–5.1)
Sodium: 136 mmol/L (ref 135–145)
Total Bilirubin: 0.8 mg/dL (ref 0.3–1.2)
Total Protein: 7 g/dL (ref 6.5–8.1)

## 2020-11-04 NOTE — ED Triage Notes (Signed)
Nasal congestion and headache x 1 week. Pt states "I know it is my sinuses and ive tried all my meds claritin, zyrtec but I still feel the same way. Also reports of bilateral ear pain 2 days ago.

## 2020-11-05 ENCOUNTER — Emergency Department (HOSPITAL_COMMUNITY): Payer: Medicaid Other

## 2020-11-05 LAB — RESP PANEL BY RT-PCR (FLU A&B, COVID) ARPGX2
Influenza A by PCR: NEGATIVE
Influenza B by PCR: NEGATIVE
SARS Coronavirus 2 by RT PCR: NEGATIVE

## 2020-11-05 MED ORDER — PROCHLORPERAZINE EDISYLATE 10 MG/2ML IJ SOLN
10.0000 mg | Freq: Once | INTRAMUSCULAR | Status: AC
  Administered 2020-11-05: 10 mg via INTRAVENOUS
  Filled 2020-11-05: qty 2

## 2020-11-05 MED ORDER — ACETAMINOPHEN 325 MG PO TABS
650.0000 mg | ORAL_TABLET | Freq: Once | ORAL | Status: AC
  Administered 2020-11-05: 650 mg via ORAL
  Filled 2020-11-05: qty 2

## 2020-11-05 MED ORDER — SODIUM CHLORIDE 0.9 % IV BOLUS
1000.0000 mL | Freq: Once | INTRAVENOUS | Status: AC
  Administered 2020-11-05: 1000 mL via INTRAVENOUS

## 2020-11-05 MED ORDER — AMOXICILLIN-POT CLAVULANATE 875-125 MG PO TABS: 1.0000 | ORAL_TABLET | Freq: Two times a day (BID) | ORAL | 0 refills | Status: DC

## 2020-11-05 MED ORDER — DIPHENHYDRAMINE HCL 50 MG/ML IJ SOLN
12.5000 mg | Freq: Once | INTRAMUSCULAR | Status: AC
  Administered 2020-11-05: 12.5 mg via INTRAVENOUS
  Filled 2020-11-05: qty 1

## 2020-11-05 MED ORDER — FLUTICASONE PROPIONATE 50 MCG/ACT NA SUSP: 1.0000 | Freq: Every day | NASAL | 0 refills | Status: AC

## 2020-11-05 NOTE — ED Notes (Signed)
Pt resting in bed with eyes closed, respirations are even and non-labored.  Pt does not appear in distress  IV fluids are running, will D/C when completed

## 2020-11-05 NOTE — Discharge Instructions (Signed)
You were seen in the emergency department today for a headache with sinus pain.  Your labs show that your creatinine which looks at kidney function was mildly elevated, do not take NSAIDs such as ibuprofen, Advil, Aleve, Motrin, Goody powder, etc. this can further worsen your kidney function. You also have mild anemia.  Please have your labs rechecked by your primary care provider.   Your CT scan showed findings of sinusitis.  We are sending home with Augmentin to take twice per day for the next 1 week as well as Flonase to use 1 spray per nostril daily to help with congestion/discomfort.  Please take Tylenol per over-the-counter dosing to help with any additional pain.  We have prescribed you new medication(s) today. Discuss the medications prescribed today with your pharmacist as they can have adverse effects and interactions with your other medicines including over the counter and prescribed medications. Seek medical evaluation if you start to experience new or abnormal symptoms after taking one of these medicines, seek care immediately if you start to experience difficulty breathing, feeling of your throat closing, facial swelling, or rash as these could be indications of a more serious allergic reaction  Please follow-up with primary care within 3 days.  Return to the ER for new or worsening symptoms including but not limited to new or worsening pain, change in your vision, neck stiffness, numbness, weakness, trouble breathing, chest pain, passing out, fever, or any other concerns

## 2020-11-05 NOTE — ED Provider Notes (Signed)
[facility] EMERGENCY DEPARTMENT Provider Note   CSN: [id number] Arrival date & time: 11/04/20  2111     History Chief Complaint  Patient presents with  . Headache  . Nasal Congestion    Briana Alvarez is a 60 y.o. female with a hx of diabetes mellitus, hypertension, bipolar affective disorder, migraines, and OSA who presents to the ED with complaints of sinus pain x 1 week. Patient reports nasal congestion, sinus pain/pressure, and subsequent sinus headache. She states pain is constant, gradual onset, steady progression, no significant alleviating/aggravating factors. Has not had a headache particularly like this before. Had an earache the other day which has since resolved. Has had some borderline temps at home, no definitive fever. She denies cough, dyspnea, numbness, weakness, neck stiffness, visual disturbance, or syncope.   HPI     Past Medical History:  Diagnosis Date  . Anxiety   . Arthritis   . Depression   . Diabetes mellitus   . Headache    "2 a month"  . Hypertension   . Migraine   . Neuropathy   . Pneumonia     Patient Active Problem List   Diagnosis Date Noted  . PVC's (premature ventricular contractions) 09/27/2016  . 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 Chest pain 06/28/2016  . Daytime hypersomnia 01/11/2016  . Chronic migraine without aura without status migrainosus, not intractable 12/29/2015  . OSA (obstructive sleep apnea) 12/29/2015  . 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 Plantar fasciitis, bilateral 12/29/2015  . Chronic left-sided low back pain with left-sided sciatica 12/29/2015  . Morbid obesity ([place]) 08/27/2015  . Left low back pain 08/27/2015  . Diabetic peripheral neuropathy associated with type 2 diabetes mellitus ([place]) 04/10/2015  . Lumbar radiculopathy, chronic 02/18/2014  . Hypertriglyceridemia 01/08/2014  . Type II diabetes mellitus, well controlled ([place]) 07/12/2013  . Insomnia 07/12/2013  . Depression 07/12/2013   . Essential hypertension, benign 07/12/2013  . Peripheral nerve disease 01/16/2013  . Peripheral neuropathy 01/16/2013  . Ankle sprain 11/30/2012  . Bipolar affective disorder ([place]) 10/12/2012  . Essential hypertension 10/12/2012  . Diabetes mellitus ([place]) 10/12/2012    Past Surgical History:  Procedure Laterality Date  . ABDOMINAL HYSTERECTOMY    . BREAST EXCISIONAL BIOPSY  2001   LEFT BREAST  . CARPAL TUNNEL RELEASE    . KNEE ARTHROSCOPY Right   . KNEE SURGERY Left   . NECK SURGERY    . SHOULDER ARTHROSCOPY    . SHOULDER ARTHROSCOPY WITH SUBACROMIAL DECOMPRESSION AND BICEP TENDON REPAIR Right 02/15/2016   Procedure: SHOULDER DIAGNOSTIC OPERATIVE ARTHROSCOPY WITH SUBACROMIAL DECOMPRESSION AND DISTAL CLAVICLE EXCISION;  Surgeon: Meredith Pel, MD;  Location: [place];  Service: Orthopedics;  Laterality: Right;  . TONSILLECTOMY    . ulner nerve     . V TACH ABLATION N/A 09/27/2016   Procedure: V Tach Ablation;  Surgeon: [name], MD;  Location: [facility];  Service: Cardiovascular;  Laterality: N/A;     OB History   No obstetric history on file.     Family History  Problem Relation Age of Onset  . Cancer Mother   . Hypertension Mother   . Breast cancer Mother   . Diabetes Daughter   . Diabetes Maternal Grandmother   . Hypertension Maternal Grandmother   . Heart attack Neg Hx   . Stroke Neg Hx     Social History   Tobacco Use  . Smoking status: Passive Smoke Exposure - Never Smoker  . Smokeless tobacco:  Never Used  Vaping Use  . Vaping Use: Never used  Substance Use Topics  . Alcohol use: No  . Drug use: No    Home Medications Prior to Admission medications   Medication Sig Start Date End Date Taking? Authorizing Provider  ACCU-CHEK AVIVA PLUS test strip USE TO MONITOR BLOOD GLUCOSE 2 TIME(S) DAILY DX CODE E11.9 12/31/18   [provider]  Accu-Chek Softclix Lancets lancets See admin instructions. 01/27/19   [provider]   acetaminophen (TYLENOL) 500 MG tablet Take 1,000 mg by mouth every 6 (six) hours as needed for headache (pain).    [provider]  albuterol (VENTOLIN HFA) 108 (90 Base) MCG/ACT inhaler Inhale 2 puffs into the lungs every 6 (six) hours as needed for wheezing or shortness of breath. 10/04/18   Jettie Booze, MD  azelastine (ASTELIN) 0.1 % nasal spray 2 SPRAYS EACH NOSTRIL FOUR TIMES DAILY FOR 3 5 DAYS THEN TWICE A DAY 09/18/18   [provider]  B-D UF III MINI PEN NEEDLES 31G X 5 MM MISC See admin instructions. 09/22/18   [provider]  benazepril (LOTENSIN) 20 MG tablet TAKE 1 TABLET BY MOUTH EVERY DAY 07/28/20   Jettie Booze, MD  Blood Glucose Monitoring Suppl (ACCU-CHEK AVIVA PLUS) w/Device KIT by Does not apply route. 10/26/18   [provider]  BYETTA 5 MCG PEN 5 MCG/0.02ML SOPN injection Inject 5 mcg as directed 2 (two) times daily before a meal. 08/01/17   [provider]  cetirizine (ZYRTEC) 10 MG tablet Take by mouth. 02/08/18   [provider]  gabapentin (NEURONTIN) 300 MG capsule TAKE 3 CAPSULES BY MOUTH 3 TIMES A DAY 12/14/15   Funches, Josalyn, MD  gentamicin cream (GARAMYCIN) 0.1 % Apply 1 application topically 2 (two) times daily. 03/30/20   Edrick Kins, DPM  guaifenesin (ROBITUSSIN) 100 MG/5ML syrup Take 5-10 mLs (100-200 mg total) by mouth every 4 (four) hours as needed for cough. 64/68/03   Vanna Scotland, MD  hydrochlorothiazide (HYDRODIURIL) 25 MG tablet TAKE ONE TABLET (25 MG DOSE) BY MOUTH DAILY. 10/20/17   [provider]  metFORMIN (GLUCOPHAGE) 1000 MG tablet TAKE 1 TABLET BY MOUTH 2 TIMES DAILY WITH A MEAL. 09/01/15   Boykin Nearing, MD  methylPREDNISolone (MEDROL) 4 MG tablet TAKE 1 TABLET EVERY DAY FOR 14 DAYS,THEN 1/2 TABLET EVERY DAY FOR 14 DAYS 09/30/19   Jessy Oto, MD  metoprolol tartrate (LOPRESSOR) 25 MG tablet Take 1 tablet by mouth 2 hours prior to Cardiac CT 09/09/19   Jettie Booze,  MD  Multiple Vitamin (MULTIVITAMIN WITH MINERALS) TABS tablet Take 1 tablet by mouth daily. Women's One a Day 18 +    [provider]  NON FORMULARY [facility]  Peripheral Neuropathy Cream- Bupivacaine 1%, Doxepin 3%, Gabapentin 6%, Pentoxifylline 3%, Topiramate 1% Apply 1-2 grams to affected area 3-4 times daily Qty. 120 gm 3 refills    [provider]  rosuvastatin (CRESTOR) 20 MG tablet Take 1 tablet (20 mg total) by mouth daily. 10/23/19   Jettie Booze, MD  sertraline (ZOLOFT) 50 MG tablet Take 50 mg by mouth daily. 08/22/17   [provider]    Allergies    Shrimp [shellfish allergy]  Review of Systems   Review of Systems  Constitutional: Negative for chills and fever.  HENT: Positive for congestion, ear pain (resolved), sinus pressure and sinus pain.   Eyes: Negative for visual disturbance.  Respiratory: Negative for cough and  shortness of breath.   Cardiovascular: Negative for chest pain.  Gastrointestinal: Negative for abdominal pain and vomiting.  Neurological: Positive for headaches. Negative for syncope, facial asymmetry, speech difficulty, weakness and numbness.  All other systems reviewed and are negative.   Physical Exam Updated Vital Signs BP 115/67   Pulse (!) 55   Temp 99.2 F (37.3 C) (Oral)   Resp 16   Ht '5\' 6"'  (1.676 m)   Wt 98 kg   LMP  (LMP Unknown)   SpO2 97%   BMI 34.86 kg/m   Physical Exam Vitals and nursing note reviewed.  Constitutional:      General: She is not in acute distress.    Appearance: She is well-developed.  HENT:     Head: Normocephalic and atraumatic.     Right Ear: Ear canal normal. Tympanic membrane is not perforated, erythematous, retracted or bulging.     Left Ear: Ear canal normal. Tympanic membrane is not perforated, erythematous, retracted or bulging.     Ears:     Comments: No mastoid erythema/swelling/tenderness.     Nose: Congestion present.     Right Sinus: Maxillary sinus  tenderness present. No frontal sinus tenderness.     Left Sinus: Maxillary sinus tenderness present. No frontal sinus tenderness.     Mouth/Throat:     Pharynx: Uvula midline. No oropharyngeal exudate or posterior oropharyngeal erythema.     Comments: Posterior oropharynx is symmetric appearing. Patient tolerating own secretions without difficulty. No trismus. No drooling. No hot potato voice. No swelling beneath the tongue, submandibular compartment is soft.  Eyes:     General:        Right eye: No discharge.        Left eye: No discharge.     Conjunctiva/sclera: Conjunctivae normal.     Pupils: Pupils are equal, round, and reactive to light.  Cardiovascular:     Rate and Rhythm: Normal rate and regular rhythm.     Heart sounds: No murmur heard.   Pulmonary:     Effort: Pulmonary effort is normal. No respiratory distress.     Breath sounds: Normal breath sounds. No wheezing, rhonchi or rales.  Abdominal:     General: There is no distension.     Palpations: Abdomen is soft.     Tenderness: There is no abdominal tenderness.  Musculoskeletal:     Cervical back: Normal range of motion and neck supple. No edema or rigidity.  Lymphadenopathy:     Cervical: No cervical adenopathy.  Skin:    General: Skin is warm and dry.     Findings: No rash.  Neurological:     Mental Status: She is alert.     Comments: Clear speech. Cn III-XII grossly intact. Sensation grossly intact to bilateral upper/lower extremities. 5/5 symmetric grip strength & strength with plantar/dorsiflexion bilaterally.   Psychiatric:        Behavior: Behavior normal.     ED Results / Procedures / Treatments   Labs (all labs ordered are listed, but only abnormal results are displayed) Labs Reviewed  CBC WITH DIFFERENTIAL/PLATELET - Abnormal; Notable for the following components:      Result Value   RBC 3.68 (*)    Hemoglobin 11.1 (*)    HCT 34.7 (*)    All other components within normal limits  COMPREHENSIVE  METABOLIC PANEL - Abnormal; Notable for the following components:   Glucose, Bld 107 (*)    Creatinine, Ser 1.44 (*)    GFR, Estimated 42 (*)  All other components within normal limits  RESP PANEL BY RT-PCR (FLU A&B, COVID) [id number]    EKG None  Radiology No results found.  Procedures Procedures   Medications Ordered in ED Medications - No data to display  ED Course  I have reviewed the triage vital signs and the nursing notes.  Pertinent labs & imaging results that were available during my care of the patient were reviewed by me and considered in my medical decision making (see chart for details).    MDM Rules/Calculators/A&P                         Patient presents to the ED with complaints of sinus pain/headache. Nontoxic, vitals without significant abnormality.   Additional history obtained:  Additional history obtained from chart review & nursing note review.   Lab Tests:  I reviewed and interpreted labs, which included:  CBC: mild anemia CMP: mild increase in creatinine.  Covid/flu: Negative  Imaging Studies ordered:  I ordered imaging studies which included CT head wo contrast, I independently reviewed, formal radiology impression shows:   ED Course:  Migraine cocktail & fluids ordered.  No focal neuro deficits. No meningismus. No visual disturbance. CT head w/ chronic sinusitis, no acute intracranial abnormality. Covid/flu negative.   On re-assessment patient is sleeping, when awakened states she feels better, remains w/ reassuring neuro exam. With worsening sxs for 7 days now, patient with questionable temps at home, will cover for bacterial sinusitis with augmentin. Flonase given in addition to this. PCP follow up. I discussed results, treatment plan, need for follow-up, and return precautions with the patient. Provided opportunity for questions, patient confirmed understanding and is in agreement with plan.   Portions of this note were generated with Geographical information systems officer. Dictation errors may occur despite best attempts at proofreading.  Final Clinical Impression(s) / ED Diagnoses Final diagnoses:  Sinusitis, unspecified chronicity, unspecified location  Elevated serum creatinine    Rx / DC Orders ED Discharge Orders         Ordered    amoxicillin-clavulanate (AUGMENTIN) 875-125 MG tablet  Every 12 hours        11/05/20 0321    fluticasone (FLONASE) 50 MCG/ACT nasal spray  Daily        11/05/20 0321           Amaryllis Dyke, PA-C 11/05/20 0708    Orpah Greek, MD 11/10/20 [phone]

## 2021-07-22 ENCOUNTER — Other Ambulatory Visit: Payer: Self-pay | Admitting: Interventional Cardiology

## 2021-08-18 ENCOUNTER — Other Ambulatory Visit: Payer: Self-pay | Admitting: Interventional Cardiology

## 2021-10-04 ENCOUNTER — Other Ambulatory Visit: Payer: Self-pay | Admitting: Interventional Cardiology

## 2021-11-02 ENCOUNTER — Ambulatory Visit: Payer: Medicaid Other | Admitting: Podiatry

## 2021-11-21 ENCOUNTER — Other Ambulatory Visit: Payer: Self-pay

## 2021-11-21 ENCOUNTER — Emergency Department (HOSPITAL_COMMUNITY): Payer: Medicaid Other

## 2021-11-21 ENCOUNTER — Emergency Department (HOSPITAL_COMMUNITY)
Admission: EM | Admit: 2021-11-21 | Discharge: 2021-11-21 | Disposition: A | Discharge status: Home / Self Care | Payer: Medicaid Other | Attending: Emergency Medicine | Admitting: Emergency Medicine

## 2021-11-21 ENCOUNTER — Encounter (HOSPITAL_COMMUNITY): Payer: Self-pay | Admitting: Emergency Medicine

## 2021-11-21 DIAGNOSIS — E1140 Type 2 diabetes mellitus with diabetic neuropathy, unspecified: Secondary | ICD-10-CM | POA: Insufficient documentation

## 2021-11-21 DIAGNOSIS — E11621 Type 2 diabetes mellitus with foot ulcer: Secondary | ICD-10-CM | POA: Diagnosis not present

## 2021-11-21 DIAGNOSIS — Z79899 Other long term (current) drug therapy: Secondary | ICD-10-CM | POA: Diagnosis not present

## 2021-11-21 DIAGNOSIS — L97519 Non-pressure chronic ulcer of other part of right foot with unspecified severity: Secondary | ICD-10-CM | POA: Insufficient documentation

## 2021-11-21 DIAGNOSIS — R7989 Other specified abnormal findings of blood chemistry: Secondary | ICD-10-CM | POA: Diagnosis not present

## 2021-11-21 DIAGNOSIS — R509 Fever, unspecified: Secondary | ICD-10-CM | POA: Diagnosis present

## 2021-11-21 DIAGNOSIS — L03115 Cellulitis of right lower limb: Secondary | ICD-10-CM | POA: Insufficient documentation

## 2021-11-21 DIAGNOSIS — Z7984 Long term (current) use of oral hypoglycemic drugs: Secondary | ICD-10-CM | POA: Insufficient documentation

## 2021-11-21 DIAGNOSIS — L03119 Cellulitis of unspecified part of limb: Secondary | ICD-10-CM

## 2021-11-21 LAB — CBC WITH DIFFERENTIAL/PLATELET
Abs Immature Granulocytes: 0.02 10*3/uL (ref 0.00–0.07)
Basophils Absolute: 0 10*3/uL (ref 0.0–0.1)
Basophils Relative: 0 %
Eosinophils Absolute: 0.1 10*3/uL (ref 0.0–0.5)
Eosinophils Relative: 1 %
HCT: 38.5 % (ref 36.0–46.0)
Hemoglobin: 12.3 g/dL (ref 12.0–15.0)
Immature Granulocytes: 0 %
Lymphocytes Relative: 24 %
Lymphs Abs: 1.9 10*3/uL (ref 0.7–4.0)
MCH: 30.9 pg (ref 26.0–34.0)
MCHC: 31.9 g/dL (ref 30.0–36.0)
MCV: 96.7 fL (ref 80.0–100.0)
Monocytes Absolute: 0.5 10*3/uL (ref 0.1–1.0)
Monocytes Relative: 7 %
Neutro Abs: 5.4 10*3/uL (ref 1.7–7.7)
Neutrophils Relative %: 68 %
Platelets: 155 10*3/uL (ref 150–400)
RBC: 3.98 MIL/uL (ref 3.87–5.11)
RDW: 15.1 % (ref 11.5–15.5)
WBC: 7.9 10*3/uL (ref 4.0–10.5)
nRBC: 0 % (ref 0.0–0.2)

## 2021-11-21 LAB — COMPREHENSIVE METABOLIC PANEL
ALT: 15 U/L (ref 0–44)
AST: 21 U/L (ref 15–41)
Albumin: 3.4 g/dL — ABNORMAL LOW (ref 3.5–5.0)
Alkaline Phosphatase: 105 U/L (ref 38–126)
Anion gap: 9 (ref 5–15)
BUN: 25 mg/dL — ABNORMAL HIGH (ref 8–23)
CO2: 27 mmol/L (ref 22–32)
Calcium: 9.4 mg/dL (ref 8.9–10.3)
Chloride: 96 mmol/L — ABNORMAL LOW (ref 98–111)
Creatinine, Ser: 1.68 mg/dL — ABNORMAL HIGH (ref 0.44–1.00)
GFR, Estimated: 34 mL/min — ABNORMAL LOW (ref >60)
Glucose, Bld: 267 mg/dL — ABNORMAL HIGH (ref 70–99)
Potassium: 4.7 mmol/L (ref 3.5–5.1)
Sodium: 132 mmol/L — ABNORMAL LOW (ref 135–145)
Total Bilirubin: 0.5 mg/dL (ref 0.3–1.2)
Total Protein: 6.8 g/dL (ref 6.5–8.1)

## 2021-11-21 LAB — LACTIC ACID, PLASMA
Lactic Acid, Venous: 2.3 mmol/L (ref 0.5–1.9)
Lactic Acid, Venous: 1.8 mmol/L (ref 0.5–1.9)

## 2021-11-21 MED ORDER — DOXYCYCLINE HYCLATE 100 MG PO CAPS
100.0000 mg | ORAL_CAPSULE | Freq: Two times a day (BID) | ORAL | 0 refills | Status: AC
Start: 2021-11-21 — End: ?

## 2021-11-21 MED ORDER — HYDROMORPHONE HCL 1 MG/ML IJ SOLN
1.0000 mg | Freq: Once | INTRAMUSCULAR | Status: AC
  Administered 2021-11-21: 1 mg via INTRAVENOUS
  Filled 2021-11-21: qty 1

## 2021-11-21 MED ORDER — GADOBUTROL 1 MMOL/ML IV SOLN
10.0000 mL | Freq: Once | INTRAVENOUS | Status: AC | PRN
  Administered 2021-11-21: 10 mL via INTRAVENOUS

## 2021-11-21 MED ORDER — DOXYCYCLINE HYCLATE 100 MG PO TABS
100.0000 mg | ORAL_TABLET | Freq: Once | ORAL | Status: AC
  Administered 2021-11-21: 100 mg via ORAL
  Filled 2021-11-21: qty 1

## 2021-11-21 MED ORDER — DOXYCYCLINE HYCLATE 100 MG PO CAPS: 100.0000 mg | ORAL_CAPSULE | Freq: Two times a day (BID) | ORAL | 0 refills | Status: DC

## 2021-11-21 MED ORDER — LORAZEPAM 2 MG/ML IJ SOLN
1.0000 mg | Freq: Once | INTRAMUSCULAR | Status: DC
Start: 2021-11-21 — End: 2021-11-21

## 2021-11-21 NOTE — ED Notes (Signed)
Patient transported to MRI 

## 2021-11-21 NOTE — ED Notes (Signed)
Pt still waiting for mri

## 2021-11-21 NOTE — ED Provider Triage Note (Signed)
Emergency Medicine Provider Triage Evaluation Note  Lorine Iannaccone , a 61 y.o. female  was evaluated in triage.  Pt complains of right foot ulcer for three months.  She has been to [facility] ER 4 times for this per her report.  She reports the pain and redness is now moving into the foot.   She reports on Thursday she was at 103.5.     Physical Exam  BP (!) 133/112 (BP Location: Right Arm)   Pulse 61   Temp 98.9 F (37.2 C) (Oral)   Resp 18   LMP  (LMP Unknown)   SpO2 100%  Gen:   Awake, no distress   Resp:  Normal effort  MSK:   Moves extremities without difficulty  Other:  Dopplerable pulses in the Right foot DP/PT.   Medical Decision Making  Medically screening exam initiated at 2:38 PM.  Appropriate orders placed.  Malarie Tappen was informed that the remainder of the evaluation will be completed by another provider, this initial triage assessment does not replace that evaluation, and the importance of remaining in the ED until their evaluation is complete.     Lorin Glass, [place] 11/21/21 1445

## 2021-11-21 NOTE — ED Notes (Signed)
The pt arrived with a rt foot ulcer approx size of a dime   the ulcer has been there for a while  for the past 2-3 days the wound edges have become inflamned  and redness  extending upward    she had a temp Thursday and Friday  she denies one today  a and o x 4  family at   the bedside   mri has placed her onto the schedule there  no sedation needed

## 2021-11-21 NOTE — ED Provider Notes (Cosign Needed Addendum)
[facility] EMERGENCY DEPARTMENT Provider Note   CSN: [id number] Arrival date & time: 11/21/21  1258     History  Chief Complaint  Patient presents with   Foot Ulcer    Briana Alvarez is a 61 y.o. female.  HPI  Patient presents with right foot ulcer x3 months.  States that started 3 months ago, she typically lives in [place] has been seen in the [facility] ED multiple times.  She was put on Bactrim in April, denies being on any other antibiotics since then.  States that the ulcer has been purulent and draining but that she noticed spreading erythema and warmth to the sole of her foot 4 days ago.  She had a fever that day of 103, states she is "eating Tylenol like candy".    Past medical history of diabetes, neuropathy, anxiety  Home Medications Prior to Admission medications   Medication Sig Start Date End Date Taking? Authorizing Provider  ACCU-CHEK AVIVA PLUS test strip USE TO MONITOR BLOOD GLUCOSE 2 TIME(S) DAILY DX CODE E11.9 12/31/18   [provider]  Accu-Chek Softclix Lancets lancets See admin instructions. 01/27/19   [provider]  acetaminophen (TYLENOL) 500 MG tablet Take 1,000 mg by mouth every 6 (six) hours as needed for headache (pain).    [provider]  albuterol (VENTOLIN HFA) 108 (90 Base) MCG/ACT inhaler Inhale 2 puffs into the lungs every 6 (six) hours as needed for wheezing or shortness of breath. 10/04/18   Jettie Booze, MD  azelastine (ASTELIN) 0.1 % nasal spray 2 SPRAYS EACH NOSTRIL FOUR TIMES DAILY FOR 3 5 DAYS THEN TWICE A DAY 09/18/18   [provider]  B-D UF III MINI PEN NEEDLES 31G X 5 MM MISC See admin instructions. 09/22/18   [provider]  benazepril (LOTENSIN) 20 MG tablet Take 1 tablet (20 mg total) by mouth daily. Please make overdue appt with Dr. Irish Lack before anymore refills. Thank you 2nd attempt 08/18/21   Jettie Booze, MD  Blood Glucose Monitoring Suppl (ACCU-CHEK AVIVA  PLUS) w/Device KIT by Does not apply route. 10/26/18   [provider]  BYETTA 5 MCG PEN 5 MCG/0.02ML SOPN injection Inject 5 mcg as directed 2 (two) times daily before a meal. 08/01/17   [provider]  cetirizine (ZYRTEC) 10 MG tablet Take by mouth. 02/08/18   [provider]  doxycycline (VIBRAMYCIN) 100 MG capsule Take 1 capsule (100 mg total) by mouth 2 (two) times daily. 11/21/21   Sherrill Raring, PA-C  fluticasone (FLONASE) 50 MCG/ACT nasal spray Place 1 spray into both nostrils daily. 11/05/20   Petrucelli, Samantha R, PA-C  gabapentin (NEURONTIN) 300 MG capsule TAKE 3 CAPSULES BY MOUTH 3 TIMES A DAY 12/14/15   Funches, Josalyn, MD  gentamicin cream (GARAMYCIN) 0.1 % Apply 1 application topically 2 (two) times daily. 03/30/20   Edrick Kins, DPM  guaifenesin (ROBITUSSIN) 100 MG/5ML syrup Take 5-10 mLs (100-200 mg total) by mouth every 4 (four) hours as needed for cough. 66/44/03   Vanna Scotland, MD  hydrochlorothiazide (HYDRODIURIL) 25 MG tablet TAKE ONE TABLET (25 MG DOSE) BY MOUTH DAILY. 10/20/17   [provider]  metFORMIN (GLUCOPHAGE) 1000 MG tablet TAKE 1 TABLET BY MOUTH 2 TIMES DAILY WITH A MEAL. 09/01/15   Boykin Nearing, MD  methylPREDNISolone (MEDROL) 4 MG tablet TAKE 1 TABLET EVERY DAY FOR 14 DAYS,THEN 1/2 TABLET EVERY DAY FOR 14 DAYS 09/30/19   Jessy Oto, MD  metoprolol tartrate (  LOPRESSOR) 25 MG tablet Take 1 tablet by mouth 2 hours prior to Cardiac CT 09/09/19   Jettie Booze, MD  Multiple Vitamin (MULTIVITAMIN WITH MINERALS) TABS tablet Take 1 tablet by mouth daily. Women's One a Day 43 +    [provider]  NON FORMULARY [facility]  Peripheral Neuropathy Cream- Bupivacaine 1%, Doxepin 3%, Gabapentin 6%, Pentoxifylline 3%, Topiramate 1% Apply 1-2 grams to affected area 3-4 times daily Qty. 120 gm 3 refills    [provider]  rosuvastatin (CRESTOR) 20 MG tablet Take 1 tablet (20 mg total) by mouth daily. 10/23/19    Jettie Booze, MD  sertraline (ZOLOFT) 50 MG tablet Take 50 mg by mouth daily. 08/22/17   [provider]      Allergies    Shrimp [shellfish allergy]    Review of Systems   Review of Systems  Physical Exam Updated Vital Signs BP (!) 154/84   Pulse 68   Temp 98.9 F (37.2 C) (Oral)   Resp 16   LMP  (LMP Unknown)   SpO2 96%  Physical Exam Vitals and nursing note reviewed. Exam conducted with a chaperone present.  Constitutional:      Appearance: Normal appearance.  HENT:     Head: Normocephalic and atraumatic.  Eyes:     General: No scleral icterus.       Right eye: No discharge.        Left eye: No discharge.     Extraocular Movements: Extraocular movements intact.     Pupils: Pupils are equal, round, and reactive to light.  Cardiovascular:     Rate and Rhythm: Normal rate and regular rhythm.     Heart sounds: Normal heart sounds. No murmur heard.   No friction rub. No gallop.     Comments: Pulses are dopplerable Pulmonary:     Effort: Pulmonary effort is normal. No respiratory distress.     Breath sounds: Normal breath sounds.  Abdominal:     General: Abdomen is flat. Bowel sounds are normal. There is no distension.     Palpations: Abdomen is soft.     Tenderness: There is no abdominal tenderness.  Skin:    General: Skin is warm and dry.     Capillary Refill: Capillary refill takes less than 2 seconds.     Coloration: Skin is not jaundiced.     Findings: Erythema present.  Neurological:     Mental Status: She is alert. Mental status is at baseline.     Coordination: Coordination normal.     ED Results / Procedures / Treatments   Labs (all labs ordered are listed, but only abnormal results are displayed) Labs Reviewed  COMPREHENSIVE METABOLIC PANEL - Abnormal; Notable for the following components:      Result Value   Sodium 132 (*)    Chloride 96 (*)    Glucose, Bld 267 (*)    BUN 25 (*)    Creatinine, Ser 1.68 (*)    Albumin 3.4 (*)     GFR, Estimated 34 (*)    All other components within normal limits  LACTIC ACID, PLASMA - Abnormal; Notable for the following components:   Lactic Acid, Venous 2.3 (*)    All other components within normal limits  CBC WITH DIFFERENTIAL/PLATELET  LACTIC ACID, PLASMA    EKG None  Radiology DG Foot Complete Right  Result Date: 11/21/2021 CLINICAL DATA:  Right foot diabetic ulcer. Right foot pain for 3 months. EXAM: RIGHT FOOT COMPLETE -  3+ VIEW COMPARISON:  12/11/2019 FINDINGS: There is no evidence of acute fracture or dislocation. Old fracture deformity of the 5th metatarsal shaft is seen with internal fixation screw, stable in appearance compared to prior. There is no evidence of arthropathy or other focal bone abnormality. Generalized osteopenia noted as well as small plantar calcaneal bone spur. Soft tissues are unremarkable. IMPRESSION: No acute findings. Old fracture deformity of 5th metatarsal. Osteopenia and small plantar calcaneal bone spur. Electronically Signed   By: John A Stahl M.D.   On: 11/21/2021 15:35    Procedures Procedures    Medications Ordered in ED Medications  gadobutrol (GADAVIST) 1 MMOL/ML injection 10 mL (10 mLs Intravenous Contrast Given 11/21/21 2208)  HYDROmorphone (DILAUDID) injection 1 mg (1 mg Intravenous Given 11/21/21 2300)  doxycycline (VIBRA-TABS) tablet 100 mg (100 mg Oral Given 11/21/21 2259)    ED Course/ Medical Decision Making/ A&P                           Medical Decision Making Amount and/or Complexity of Data Reviewed Radiology: ordered.  Risk Prescription drug management.   Patient presents due to wounds to right lower extremity.  Differential includes but not limited to diabetic foot ulcer, cellulitis, osteomyelitis, ischemic limb, compartment syndrome.  Patient's pulses are detectable via Doppler, extremities are warm with brisk cap refill.  Doubt ischemic limb, compartments are soft so think that is unlikely as well.  Patient does not  have leukocytosis or anemia.  No gross electrolyte derangement or AKI.  She does have an elevated lactic acid of 2.3 which was ordered during triage. Repeat nondetectable at 1.8.   Considered trial of outpatient antibiotics and close follow-up but there is concern for possible osteomyelitis so we will proceed with MR foot for more thorough evaluation.  Preliminary MRI results received.  There is no evidence of osteomyelitis.  Full report will be ready until morning, I did speak with the radiologist.  Discussed the results with the patient, she will follow-up with her podiatrist.  She actually has a podiatrist appointment upcoming in [place] where she lives.  I have sent the antibiotic doxycycline to her pharmacy, we discussed return precautions and she is appropriate for discharge at this time.  Discussed HPI, physical exam and plan of care for this patient with attending Matthew Trifan. The attending physician evaluated this patient as part of a shared visit and agrees with plan of care.         Final Clinical Impression(s) / ED Diagnoses Final diagnoses:  Cellulitis of foot    Rx / DC Orders ED Discharge Orders          Ordered    doxycycline (VIBRAMYCIN) 100 MG capsule  2 times daily,   Status:  Discontinued        11/21/21 2301    doxycycline (VIBRAMYCIN) 100 MG capsule  2 times daily        11/21/21 2314              , , PA-C 11/21/21 2316    , , PA-C 11/21/21 2317    Trifan, Matthew J, MD 11/22/21 1129  

## 2021-11-21 NOTE — ED Notes (Signed)
DC instructions reviewed with pt. PT verbalized understanding. PT DC °

## 2021-11-21 NOTE — ED Triage Notes (Signed)
Pt reports diabetic foot ulcer on bottom of R foot.  States she has been seen 4 times in the last 2 months at [facility] ER.  Completed antibiotics.  Reports chills since Wednesday and fever up to 103.5.

## 2021-11-21 NOTE — Discharge Instructions (Signed)
Take doxycycline twice daily for the next 10 days.  You will need to follow-up with podiatry, information provided above please call Monday to schedule an appointment.  You may also need a referral to wound care clinic, information above for [facility] but you may want to find one closer to where you live in [place] with your primary care provider.

## 2021-11-21 NOTE — ED Notes (Signed)
Mri has called pt needs an iv  a and o x4  no med needed for mri  may be getting her in 1-2 hours

## 2022-02-09 ENCOUNTER — Ambulatory Visit: Payer: Self-pay | Admitting: Podiatry

## 2022-03-23 ENCOUNTER — Encounter: Payer: Self-pay | Admitting: Podiatry

## 2022-03-28 ENCOUNTER — Ambulatory Visit: Payer: Self-pay | Admitting: Podiatry
